# Patient Record
Sex: Male | Born: 1965
Health system: Southern US, Community
[De-identification: ages and names within clinical notes are randomized; demographics above are authoritative.]

## PROBLEM LIST (undated history)

## (undated) DIAGNOSIS — M1990 Unspecified osteoarthritis, unspecified site: Secondary | ICD-10-CM

## (undated) DIAGNOSIS — G2581 Restless legs syndrome: Secondary | ICD-10-CM

## (undated) DIAGNOSIS — M109 Gout, unspecified: Secondary | ICD-10-CM

## (undated) DIAGNOSIS — L089 Local infection of the skin and subcutaneous tissue, unspecified: Secondary | ICD-10-CM

## (undated) DIAGNOSIS — R0600 Dyspnea, unspecified: Secondary | ICD-10-CM

## (undated) DIAGNOSIS — I5189 Other ill-defined heart diseases: Secondary | ICD-10-CM

## (undated) DIAGNOSIS — E119 Type 2 diabetes mellitus without complications: Secondary | ICD-10-CM

## (undated) DIAGNOSIS — G4730 Sleep apnea, unspecified: Secondary | ICD-10-CM

## (undated) DIAGNOSIS — T4145XA Adverse effect of unspecified anesthetic, initial encounter: Secondary | ICD-10-CM

## (undated) DIAGNOSIS — I10 Essential (primary) hypertension: Secondary | ICD-10-CM

## (undated) DIAGNOSIS — S62109A Fracture of unspecified carpal bone, unspecified wrist, initial encounter for closed fracture: Secondary | ICD-10-CM

## (undated) DIAGNOSIS — F329 Major depressive disorder, single episode, unspecified: Secondary | ICD-10-CM

## (undated) DIAGNOSIS — F419 Anxiety disorder, unspecified: Secondary | ICD-10-CM

## (undated) DIAGNOSIS — M2550 Pain in unspecified joint: Secondary | ICD-10-CM

## (undated) DIAGNOSIS — R079 Chest pain, unspecified: Secondary | ICD-10-CM

## (undated) DIAGNOSIS — F32A Depression, unspecified: Secondary | ICD-10-CM

## (undated) DIAGNOSIS — I509 Heart failure, unspecified: Secondary | ICD-10-CM

## (undated) HISTORY — PX: TONSILLECTOMY: SUR1361

## (undated) HISTORY — DX: Anxiety disorder, unspecified: F41.9

## (undated) HISTORY — DX: Unspecified osteoarthritis, unspecified site: M19.90

## (undated) HISTORY — DX: Pain in unspecified joint: M25.50

## (undated) HISTORY — PX: HERNIA REPAIR: SHX51

## (undated) HISTORY — DX: Depression, unspecified: F32.A

## (undated) HISTORY — DX: Chest pain, unspecified: R07.9

## (undated) HISTORY — DX: Dyspnea, unspecified: R06.00

---
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
## 1983-04-22 HISTORY — PX: HUMERUS SURGERY: SHX672

## 2005-04-21 DIAGNOSIS — S62109A Fracture of unspecified carpal bone, unspecified wrist, initial encounter for closed fracture: Secondary | ICD-10-CM

## 2005-04-21 HISTORY — DX: Fracture of unspecified carpal bone, unspecified wrist, initial encounter for closed fracture: S62.109A

## 2012-09-14 ENCOUNTER — Encounter (HOSPITAL_BASED_OUTPATIENT_CLINIC_OR_DEPARTMENT_OTHER): Payer: Self-pay | Admitting: *Deleted

## 2012-09-14 ENCOUNTER — Emergency Department (HOSPITAL_BASED_OUTPATIENT_CLINIC_OR_DEPARTMENT_OTHER)
Admission: EM | Admit: 2012-09-14 | Discharge: 2012-09-14 | Disposition: A | Discharge status: Home / Self Care | Payer: 59 | Attending: Emergency Medicine | Admitting: Emergency Medicine

## 2012-09-14 DIAGNOSIS — I10 Essential (primary) hypertension: Secondary | ICD-10-CM | POA: Insufficient documentation

## 2012-09-14 DIAGNOSIS — M109 Gout, unspecified: Secondary | ICD-10-CM | POA: Insufficient documentation

## 2012-09-14 DIAGNOSIS — M25439 Effusion, unspecified wrist: Secondary | ICD-10-CM | POA: Insufficient documentation

## 2012-09-14 DIAGNOSIS — Z79899 Other long term (current) drug therapy: Secondary | ICD-10-CM | POA: Insufficient documentation

## 2012-09-14 HISTORY — DX: Gout, unspecified: M10.9

## 2012-09-14 HISTORY — DX: Essential (primary) hypertension: I10

## 2012-09-14 MED ORDER — INDOMETHACIN 25 MG PO CAPS: 25.0000 mg | ORAL_CAPSULE | Freq: Three times a day (TID) | ORAL | Status: DC

## 2012-09-14 MED ORDER — PREDNISONE 20 MG PO TABS: 40.0000 mg | ORAL_TABLET | Freq: Every day | ORAL | Status: DC

## 2012-09-14 MED ORDER — COLCHICINE 0.6 MG PO TABS
1.2000 mg | ORAL_TABLET | Freq: Once | ORAL | Status: AC
  Administered 2012-09-14: 1.2 mg via ORAL
  Filled 2012-09-14: qty 2

## 2012-09-14 MED ORDER — HYDROCODONE-ACETAMINOPHEN 5-325 MG PO TABS: 2.0000 | ORAL_TABLET | ORAL | Status: DC | PRN

## 2012-09-14 NOTE — ED Notes (Signed)
Right wrist pain since yesterday. States he is having an acute gout attack.

## 2012-09-14 NOTE — ED Provider Notes (Signed)
History     CSN: [id number]  Arrival date & time 09/14/12  2010   First MD Initiated Contact with Patient 09/14/12 2033      Chief Complaint  Patient presents with  . Wrist Pain    (Consider location/radiation/quality/duration/timing/severity/associated sxs/prior treatment) HPI Comments: 47 year old male with a history of frequent gouty attacks presents with a complaint of right wrist pain, onset was acute, yesterday, persistent, worse with palpation and range of motion, not associated with fevers. This is not common for him to have frequent gouty attacks, this feels similar to prior attacks and has no fevers redness though he does have mild swelling of the right wrist. He has been taking his allopurinol without missing any doses  Patient is a 47 y.o. male presenting with wrist pain. The history is provided by the patient.  Wrist Pain    Past Medical History  Diagnosis Date  . Gout   . Hypertension     Past Surgical History  Procedure Laterality Date  . Anterior cruciate ligament repair      No family history on file.  History  Substance Use Topics  . Smoking status: Never Smoker   . Smokeless tobacco: Not on file  . Alcohol Use: No      Review of Systems  Constitutional: Negative for fever.  Musculoskeletal: Positive for joint swelling.    Allergies  Review of patient's allergies indicates no known allergies.  Home Medications   Current Outpatient Rx  Name  Route  Sig  Dispense  Refill  . allopurinol (ZYLOPRIM) 300 MG tablet   Oral   Take 300 mg by mouth daily.         [name] lisinopril (PRINIVIL,ZESTRIL) 10 MG tablet   Oral   Take 10 mg by mouth daily.         [name] HYDROcodone-acetaminophen (NORCO/VICODIN) 5-325 MG per tablet   Oral   Take 2 tablets by mouth every 4 (four) hours as needed for pain.   20 tablet   0   . indomethacin (INDOCIN) 25 MG capsule   Oral   Take 1 capsule (25 mg total) by mouth 3 (three) times daily with meals. May take up  to 50mg  three times a day if no improvement with 25mg .   30 capsule   0   . predniSONE (DELTASONE) 20 MG tablet   Oral   Take 2 tablets (40 mg total) by mouth daily. Take 40 mg by mouth daily for 3 days, then 20mg  by mouth daily for 3 days, then 10mg  daily for 3 days   12 tablet   0     BP 165/100  Pulse 105  Temp(Src) 99.9 F (37.7 C) (Oral)  Resp 24  Wt 310 lb (140.615 kg)  SpO2 96%  Physical Exam  Nursing note and vitals reviewed. Constitutional: He appears well-developed and well-nourished. No distress.  HENT:  Head: Normocephalic and atraumatic.  Eyes: Conjunctivae are normal. No scleral icterus.  Cardiovascular: Normal rate, regular rhythm and intact distal pulses.   Pulmonary/Chest: Effort normal and breath sounds normal.  Musculoskeletal: He exhibits tenderness ( Tenderness and mild swelling over the right wrist, no erythema or warmth). He exhibits no edema.  Neurological: He is alert.  Skin: Skin is warm and dry. No rash noted. He is not diaphoretic.    ED Course  Procedures (including critical care time)  Labs Reviewed - No data to display No results found.   1. Acute gouty arthritis  MDM  The patient has a normal musculoskeletal exam other than his right wrist. He is obese, he is already being evaluated by rheumatology for his ongoing health, he will be given medications here and prescriptions for home, doubt septic arthritis, vitals without fever, patient expresses understanding for the indications for return.   Meds given in ED:  Medications  colchicine tablet 1.2 mg (not administered)    New Prescriptions   HYDROCODONE-ACETAMINOPHEN (NORCO/VICODIN) 5-325 MG PER TABLET    Take 2 tablets by mouth every 4 (four) hours as needed for pain.   INDOMETHACIN (INDOCIN) 25 MG CAPSULE    Take 1 capsule (25 mg total) by mouth 3 (three) times daily with meals. May take up to 50mg  three times a day if no improvement with 25mg .   PREDNISONE (DELTASONE) 20  MG TABLET    Take 2 tablets (40 mg total) by mouth daily. Take 40 mg by mouth daily for 3 days, then 20mg  by mouth daily for 3 days, then 10mg  daily for 3 days            Vida Roller, MD 09/14/12 2050

## 2012-09-25 ENCOUNTER — Emergency Department (HOSPITAL_COMMUNITY)
Admission: EM | Admit: 2012-09-25 | Discharge: 2012-09-25 | Disposition: A | Discharge status: Home / Self Care | Payer: 59 | Attending: Emergency Medicine | Admitting: Emergency Medicine

## 2012-09-25 ENCOUNTER — Encounter (HOSPITAL_COMMUNITY): Payer: Self-pay | Admitting: Emergency Medicine

## 2012-09-25 DIAGNOSIS — I10 Essential (primary) hypertension: Secondary | ICD-10-CM | POA: Insufficient documentation

## 2012-09-25 DIAGNOSIS — M109 Gout, unspecified: Secondary | ICD-10-CM | POA: Insufficient documentation

## 2012-09-25 DIAGNOSIS — Z79899 Other long term (current) drug therapy: Secondary | ICD-10-CM | POA: Insufficient documentation

## 2012-09-25 DIAGNOSIS — R229 Localized swelling, mass and lump, unspecified: Secondary | ICD-10-CM | POA: Insufficient documentation

## 2012-09-25 DIAGNOSIS — L539 Erythematous condition, unspecified: Secondary | ICD-10-CM | POA: Insufficient documentation

## 2012-09-25 MED ORDER — METHYLPREDNISOLONE SODIUM SUCC 125 MG IJ SOLR
125.0000 mg | Freq: Once | INTRAMUSCULAR | Status: AC
  Administered 2012-09-25: 125 mg via INTRAMUSCULAR
  Filled 2012-09-25: qty 2

## 2012-09-25 MED ORDER — OXYCODONE-ACETAMINOPHEN 5-325 MG PO TABS: 1.0000 | ORAL_TABLET | ORAL | Status: DC | PRN

## 2012-09-25 MED ORDER — PREDNISONE 20 MG PO TABS: 40.0000 mg | ORAL_TABLET | Freq: Every day | ORAL | Status: DC

## 2012-09-25 MED ORDER — KETOROLAC TROMETHAMINE 60 MG/2ML IM SOLN
60.0000 mg | Freq: Once | INTRAMUSCULAR | Status: AC
  Administered 2012-09-25: 60 mg via INTRAMUSCULAR
  Filled 2012-09-25: qty 2

## 2012-09-25 NOTE — ED Notes (Signed)
Pt states he has R wrist pain from gout.  Gout attack started last night.

## 2012-09-25 NOTE — ED Provider Notes (Signed)
History     CSN: [id number]  Arrival date & time 09/25/12  1240   First MD Initiated Contact with Patient 09/25/12 1326      Chief Complaint  Patient presents with  . Wrist Pain  . Gout    (Consider location/radiation/quality/duration/timing/severity/associated sxs/prior treatment) HPI Comments: Pt presents to the ED for right wrist pain. Patient has a history of gout with frequent attacks lately.  This attack started last night and has progressively worsened. Patient has recently started taking his daily allopurinol but states flares are not under control yet.  Patient has a followup appointment with his primary care physician on Wednesday for this.  Patient has been following gout diet and avoiding red meat, beer, and shellfish.  Pt and family recently moved back to [place] and has been under increased stress at work- he is unsure if this has contributed to the increase in flares.  Denies any numbness or paresthesias of right hand or fingers. No recent injury or trauma.  Pt cannot tolerate colchicine or indomethacin- causes GI upset.  The history is provided by the patient.    Past Medical History  Diagnosis Date  . Gout   . Hypertension     Past Surgical History  Procedure Laterality Date  . Anterior cruciate ligament repair    . Humerus surgery      History reviewed. No pertinent family history.  History  Substance Use Topics  . Smoking status: Never Smoker   . Smokeless tobacco: Not on file  . Alcohol Use: No      Review of Systems  Musculoskeletal: Positive for arthralgias.  All other systems reviewed and are negative.    Allergies  Review of patient's allergies indicates no known allergies.  Home Medications   Current Outpatient Rx  Name  Route  Sig  Dispense  Refill  . allopurinol (ZYLOPRIM) 300 MG tablet   Oral   Take 300 mg by mouth daily.         [name] HYDROcodone-acetaminophen (NORCO/VICODIN) 5-325 MG per tablet   Oral   Take 2 tablets by  mouth every 4 (four) hours as needed for pain.   20 tablet   0   . indomethacin (INDOCIN) 25 MG capsule   Oral   Take 1 capsule (25 mg total) by mouth 3 (three) times daily with meals. May take up to 50mg  three times a day if no improvement with 25mg .   30 capsule   0   . lisinopril (PRINIVIL,ZESTRIL) 10 MG tablet   Oral   Take 10 mg by mouth daily.         . predniSONE (DELTASONE) 20 MG tablet   Oral   Take 2 tablets (40 mg total) by mouth daily. Take 40 mg by mouth daily for 3 days, then 20mg  by mouth daily for 3 days, then 10mg  daily for 3 days   12 tablet   0     BP 186/111  Pulse 108  Temp(Src) 98.7 F (37.1 C) (Oral)  Resp 16  SpO2 97%  Physical Exam  Nursing note and vitals reviewed. Constitutional: He is oriented to person, place, and time. He appears well-developed and well-nourished. No distress.  HENT:  Head: Normocephalic and atraumatic.  Eyes: Conjunctivae and EOM are normal.  Neck: Normal range of motion. Neck supple.  Cardiovascular: Normal rate, regular rhythm and normal heart sounds.   Pulmonary/Chest: Effort normal and breath sounds normal.  Musculoskeletal:       Right wrist: He exhibits  decreased range of motion, tenderness, bony tenderness and swelling. He exhibits no effusion, no crepitus, no deformity and no laceration.  Right wrist diffusely TTP, erythematous, and mildly swollen, no deformity or laceration, sensation intact  Neurological: He is alert and oriented to person, place, and time.  Skin: Skin is warm and dry.  Psychiatric: He has a normal mood and affect.    ED Course  Procedures (including critical care time)  Labs Reviewed - No data to display No results found.   1. Gout flare       MDM   Pt with hx of gout, no injury or trauma- imaging deferred.  Solu-Medrol and Toradol given in the ED. Rx prednisone and Percocet. FU with PCP on Wednesday as previously scheduled . Discussed plan with patient, he agreed. Return  precautions advised.        Garlon Hatchet, PA-C 09/25/12 1536

## 2012-09-26 NOTE — ED Provider Notes (Signed)
Medical screening examination/treatment/procedure(s) were performed by non-physician practitioner and as supervising physician I was immediately available for consultation/collaboration.   Makenna Macaluso, MD 09/26/12 0705 

## 2013-03-14 ENCOUNTER — Emergency Department (HOSPITAL_BASED_OUTPATIENT_CLINIC_OR_DEPARTMENT_OTHER)
Admission: EM | Admit: 2013-03-14 | Discharge: 2013-03-14 | Disposition: A | Discharge status: Home / Self Care | Payer: 59 | Attending: Emergency Medicine | Admitting: Emergency Medicine

## 2013-03-14 ENCOUNTER — Encounter (HOSPITAL_BASED_OUTPATIENT_CLINIC_OR_DEPARTMENT_OTHER): Payer: Self-pay | Admitting: Emergency Medicine

## 2013-03-14 DIAGNOSIS — I10 Essential (primary) hypertension: Secondary | ICD-10-CM | POA: Insufficient documentation

## 2013-03-14 DIAGNOSIS — M109 Gout, unspecified: Secondary | ICD-10-CM

## 2013-03-14 DIAGNOSIS — Z79899 Other long term (current) drug therapy: Secondary | ICD-10-CM | POA: Insufficient documentation

## 2013-03-14 MED ORDER — KETOROLAC TROMETHAMINE 60 MG/2ML IM SOLN
60.0000 mg | Freq: Once | INTRAMUSCULAR | Status: AC
  Administered 2013-03-14: 60 mg via INTRAMUSCULAR
  Filled 2013-03-14: qty 2

## 2013-03-14 MED ORDER — OXYCODONE-ACETAMINOPHEN 5-325 MG PO TABS
1.0000 | ORAL_TABLET | Freq: Once | ORAL | Status: AC
  Administered 2013-03-14: 1 via ORAL
  Filled 2013-03-14: qty 1

## 2013-03-14 MED ORDER — PREDNISONE 10 MG PO TABS: ORAL_TABLET | ORAL | Status: DC

## 2013-03-14 MED ORDER — PREDNISONE 50 MG PO TABS
60.0000 mg | ORAL_TABLET | Freq: Once | ORAL | Status: AC
  Administered 2013-03-14: 60 mg via ORAL
  Filled 2013-03-14 (×2): qty 1

## 2013-03-14 MED ORDER — HYDROCODONE-ACETAMINOPHEN 5-325 MG PO TABS: 1.0000 | ORAL_TABLET | ORAL | Status: DC | PRN

## 2013-03-14 NOTE — ED Provider Notes (Signed)
CSN: [id number]     Arrival date & time 03/14/13  2119 History  This chart was scribed for Candyce Churn, MD by Leone Payor, ED Scribe. This patient was seen in room MH06/MH06 and the patient's care was started 10:04 PM.    Chief Complaint  Patient presents with  . Arm Swelling    The history is provided by the patient. No language interpreter was used.    HPI Comments: Justin Hall is a 47 y.o. male who presents to the Emergency Department complaining of a new episode of constant, worsened, severe left hand pain and swelling that began yesterday. Pt states he has a history of gout and this feels like a flare up. Pt reports having 2-3 episodes in the past 6 months. He denies recent injuries or trauma to the affected area. Pt states any sort of movement worsens the pain. He has taken Indocin in the past without relief. He states Solumedrol and pain medication relieved his symptoms in the past. He has an appointment with his PCP in 1 week. He denies fever.    Past Medical History  Diagnosis Date  . Gout   . Hypertension    Past Surgical History  Procedure Laterality Date  . Anterior cruciate ligament repair    . Humerus surgery     History reviewed. No pertinent family history. History  Substance Use Topics  . Smoking status: Never Smoker   . Smokeless tobacco: Not on file  . Alcohol Use: No    Review of Systems  All other systems reviewed and are negative.    Allergies  Review of patient's allergies indicates no known allergies.  Home Medications   Current Outpatient Rx  Name  Route  Sig  Dispense  Refill  . allopurinol (ZYLOPRIM) 300 MG tablet   Oral   Take 300 mg by mouth daily.         [name] lisinopril (PRINIVIL,ZESTRIL) 10 MG tablet   Oral   Take 10 mg by mouth daily.         [name] HYDROcodone-acetaminophen (NORCO/VICODIN) 5-325 MG per tablet   Oral   Take 2 tablets by mouth every 4 (four) hours as needed for pain.   20 tablet   0   . indomethacin  (INDOCIN) 25 MG capsule   Oral   Take 1 capsule (25 mg total) by mouth 3 (three) times daily with meals. May take up to 50mg  three times a day if no improvement with 25mg .   30 capsule   0   . oxyCODONE-acetaminophen (PERCOCET/ROXICET) 5-325 MG per tablet   Oral   Take 1 tablet by mouth every 4 (four) hours as needed for pain.   15 tablet   0   . predniSONE (DELTASONE) 20 MG tablet   Oral   Take 2 tablets (40 mg total) by mouth daily. Take 40 mg by mouth daily for 3 days, then 20mg  by mouth daily for 3 days, then 10mg  daily for 3 days   12 tablet   0   . predniSONE (DELTASONE) 20 MG tablet   Oral   Take 2 tablets (40 mg total) by mouth daily. Take 40 mg by mouth daily for 3 days, then 20mg  by mouth daily for 3 days, then 10mg  daily for 3 days   12 tablet   0    BP 190/111  Pulse 101  Temp(Src) 99.5 F (37.5 C) (Oral)  Resp 22  SpO2 100% Physical Exam  Nursing note and vitals  reviewed. Constitutional: He is oriented to person, place, and time. He appears well-developed and well-nourished. No distress.  HENT:  Head: Normocephalic and atraumatic.  Eyes: Conjunctivae are normal. No scleral icterus.  Neck: Neck supple.  Cardiovascular: Normal rate and intact distal pulses.   Pulmonary/Chest: Effort normal. No stridor. No respiratory distress.  Abdominal: Normal appearance. He exhibits no distension.  Musculoskeletal:  Moderate swelling of MCP joint of left hand with decreased ROM secondary to pain. Good cap refill. No signs of warmth or erythema.    Neurological: He is alert and oriented to person, place, and time.  Skin: Skin is warm and dry. No rash noted.  Psychiatric: He has a normal mood and affect. His behavior is normal.    ED Course  Procedures   DIAGNOSTIC STUDIES: Oxygen Saturation is 100% on RA, normal by my interpretation.    COORDINATION OF CARE: 10:12 PM Discussed treatment plan with pt at bedside and pt agreed to plan.   Labs Review Labs Reviewed -  No data to display Imaging Review No results found.  EKG Interpretation   None       MDM   1. Gouty arthritis    47 yo male with hx of gout presenting with pain in left hand, particularly around his 3-5 MCP joints.  No trauma.  No fevers.  Has some swelling and pain with ROM, but no significant erythema or warmth.  Afebrile and no reported fevers.  Doubt septic joint.  He states pain consistent with prior gout flares.  Advised close PCP follow up.    I personally performed the services described in this documentation, which was scribed in my presence. The recorded information has been reviewed and is accurate.     Candyce Churn, MD 03/14/13 [phone]

## 2013-03-14 NOTE — ED Notes (Signed)
Swelling to left hand, hx of gout

## 2013-05-03 ENCOUNTER — Emergency Department (HOSPITAL_BASED_OUTPATIENT_CLINIC_OR_DEPARTMENT_OTHER)
Admission: EM | Admit: 2013-05-03 | Discharge: 2013-05-03 | Disposition: A | Discharge status: Home / Self Care | Payer: Managed Care, Other (non HMO) | Attending: Emergency Medicine | Admitting: Emergency Medicine

## 2013-05-03 ENCOUNTER — Encounter (HOSPITAL_BASED_OUTPATIENT_CLINIC_OR_DEPARTMENT_OTHER): Payer: Self-pay | Admitting: Emergency Medicine

## 2013-05-03 DIAGNOSIS — M109 Gout, unspecified: Secondary | ICD-10-CM | POA: Insufficient documentation

## 2013-05-03 DIAGNOSIS — H6690 Otitis media, unspecified, unspecified ear: Secondary | ICD-10-CM | POA: Insufficient documentation

## 2013-05-03 DIAGNOSIS — J209 Acute bronchitis, unspecified: Secondary | ICD-10-CM | POA: Insufficient documentation

## 2013-05-03 DIAGNOSIS — R000 Tachycardia, unspecified: Secondary | ICD-10-CM | POA: Insufficient documentation

## 2013-05-03 DIAGNOSIS — Z79899 Other long term (current) drug therapy: Secondary | ICD-10-CM | POA: Insufficient documentation

## 2013-05-03 DIAGNOSIS — H6691 Otitis media, unspecified, right ear: Secondary | ICD-10-CM

## 2013-05-03 DIAGNOSIS — J40 Bronchitis, not specified as acute or chronic: Secondary | ICD-10-CM

## 2013-05-03 DIAGNOSIS — J3489 Other specified disorders of nose and nasal sinuses: Secondary | ICD-10-CM | POA: Insufficient documentation

## 2013-05-03 DIAGNOSIS — I10 Essential (primary) hypertension: Secondary | ICD-10-CM | POA: Insufficient documentation

## 2013-05-03 DIAGNOSIS — IMO0002 Reserved for concepts with insufficient information to code with codable children: Secondary | ICD-10-CM | POA: Insufficient documentation

## 2013-05-03 DIAGNOSIS — R52 Pain, unspecified: Secondary | ICD-10-CM | POA: Insufficient documentation

## 2013-05-03 MED ORDER — AMOXICILLIN 500 MG PO CAPS: 500.0000 mg | ORAL_CAPSULE | Freq: Three times a day (TID) | ORAL | Status: DC

## 2013-05-03 MED ORDER — OXYMETAZOLINE HCL 0.05 % NA SOLN
1.0000 | Freq: Three times a day (TID) | NASAL | Status: DC | PRN
  Administered 2013-05-03: 1 via NASAL
  Filled 2013-05-03: qty 15

## 2013-05-03 MED ORDER — HYDROCODONE-HOMATROPINE 5-1.5 MG/5ML PO SYRP: 5.0000 mL | ORAL_SOLUTION | Freq: Four times a day (QID) | ORAL | Status: DC | PRN

## 2013-05-03 NOTE — ED Provider Notes (Addendum)
CSN: [id number]     Arrival date & time 05/03/13  0902 History   First MD Initiated Contact with Patient 05/03/13 [phone]     Chief Complaint  Patient presents with  . cough congestion body aches x 4 days    (Consider location/radiation/quality/duration/timing/severity/associated sxs/prior Treatment) HPI Comments: Severe right ear pain starting last night  Patient is a 48 y.o. male presenting with URI. The history is provided by the patient.  URI Presenting symptoms: congestion, cough, ear pain, rhinorrhea and sore throat   Presenting symptoms: no fever   Severity:  Moderate Onset quality:  Gradual Duration:  4 days Timing:  Constant Progression:  Worsening Chronicity:  New Relieved by:  Nothing Worsened by:  Nothing tried Ineffective treatments:  OTC medications Associated symptoms: sinus pain   Associated symptoms: no neck pain and no wheezing   Risk factors: sick contacts     Past Medical History  Diagnosis Date  . Gout   . Hypertension    Past Surgical History  Procedure Laterality Date  . Anterior cruciate ligament repair    . Humerus surgery     History reviewed. No pertinent family history. History  Substance Use Topics  . Smoking status: Never Smoker   . Smokeless tobacco: Not on file  . Alcohol Use: No    Review of Systems  Constitutional: Negative for fever.  HENT: Positive for congestion, ear pain, rhinorrhea and sore throat.   Respiratory: Positive for cough. Negative for wheezing.   Musculoskeletal: Negative for neck pain.  All other systems reviewed and are negative.    Allergies  Review of patient's allergies indicates no known allergies.  Home Medications   Current Outpatient Rx  Name  Route  Sig  Dispense  Refill  . allopurinol (ZYLOPRIM) 300 MG tablet   Oral   Take 300 mg by mouth daily.         [name] HYDROcodone-acetaminophen (NORCO/VICODIN) 5-325 MG per tablet   Oral   Take 1 tablet by mouth every 4 (four) hours as needed.   10  tablet   0   . indomethacin (INDOCIN) 25 MG capsule   Oral   Take 1 capsule (25 mg total) by mouth 3 (three) times daily with meals. May take up to 50mg  three times a day if no improvement with 25mg .   30 capsule   0   . lisinopril (PRINIVIL,ZESTRIL) 10 MG tablet   Oral   Take 10 mg by mouth daily.         [name] oxyCODONE-acetaminophen (PERCOCET/ROXICET) 5-325 MG per tablet   Oral   Take 1 tablet by mouth every 4 (four) hours as needed for pain.   15 tablet   0   . predniSONE (DELTASONE) 10 MG tablet      Take 40 mg by mouth daily for 3 days, then 20mg  by mouth daily for 3 days, then 10mg  daily for 3 days   21 tablet   0   . predniSONE (DELTASONE) 20 MG tablet   Oral   Take 2 tablets (40 mg total) by mouth daily. Take 40 mg by mouth daily for 3 days, then 20mg  by mouth daily for 3 days, then 10mg  daily for 3 days   12 tablet   0    BP 172/102  Pulse 108  Temp(Src) 99.3 F (37.4 C) (Oral)  Resp 24  SpO2 98% Physical Exam  Nursing note and vitals reviewed. Constitutional: He is oriented to person, place, and time. He appears well-developed  and well-nourished. No distress.  HENT:  Head: Normocephalic and atraumatic.  Right Ear: Tympanic membrane is injected, erythematous and bulging.  Left Ear: Tympanic membrane and ear canal normal.  Nose: Mucosal edema and rhinorrhea present. Right sinus exhibits frontal sinus tenderness. Left sinus exhibits frontal sinus tenderness.  Mouth/Throat: Oropharynx is clear and moist and mucous membranes are normal. No oropharyngeal exudate, posterior oropharyngeal edema, posterior oropharyngeal erythema or tonsillar abscesses.  Eyes: Conjunctivae and EOM are normal. Pupils are equal, round, and reactive to light.  Neck: Normal range of motion. Neck supple.  Cardiovascular: Regular rhythm and intact distal pulses.  Tachycardia present.   No murmur heard. Pulmonary/Chest: Effort normal and breath sounds normal. No respiratory distress. He has  no wheezes. He has no rales.  Abdominal: Soft. He exhibits no distension. There is no tenderness. There is no rebound and no guarding.  Musculoskeletal: Normal range of motion. He exhibits no edema and no tenderness.  Neurological: He is alert and oriented to person, place, and time.  Skin: Skin is warm and dry. No rash noted. No erythema.  Psychiatric: He has a normal mood and affect. His behavior is normal.    ED Course  Procedures (including critical care time) Labs Review Labs Reviewed - No data to display Imaging Review No results found.  EKG Interpretation   None       MDM   1. Bronchitis   2. Otitis media of right ear     Pt with symptoms consistent with viral URI/bronchitis.  Well appearing here.  No signs of breathing difficulty  No wheezing or pharyngitis but pt also found to have right otitis.   Pt given afrin, cough suppressant and amoxicillin. Pt htn here but has not taken lisinopril today and has been taking OTC meds.  Instructed to avoid most OTC meds, take his BP med and follow BP.  Blanchie Dessert, MD 05/03/13 [place], MD 05/03/13 [phone]

## 2013-05-03 NOTE — ED Notes (Signed)
4 days of cough congestion ear pain scratchy throat and body aches occasional chills.

## 2013-05-03 NOTE — Discharge Instructions (Signed)
Bronchitis °Bronchitis is swelling (inflammation) of the air tubes leading to your lungs (bronchi). This causes mucus and a cough. If the swelling gets bad, you may have trouble breathing. °HOME CARE  °· Rest. °· Drink enough fluids to keep your pee (urine) clear or pale yellow (unless you have a condition where you have to watch how much you drink). °· Only take medicine as told by your doctor. If you were given antibiotic medicines, finish them even if you start to feel better. °· Avoid smoke, irritating chemicals, and strong smells. These make the problem worse. Quit smoking if you smoke. This helps your lungs heal faster. °· Use a cool mist humidifier. Change the water in the humidifier every day. You can also sit in the bathroom with hot shower running for 5 10 minutes. Keep the door closed. °· See your health care provider as told. °· Wash your hands often. °GET HELP IF: °Your problems do not get better after 1 week. °GET HELP RIGHT AWAY IF:  °· Your fever gets worse. °· You have chills. °· Your chest hurts. °· Your problems breathing get worse. °· You have blood in your mucus. °· You pass out (faint). °· You feel lightheaded. °· You have a bad headache. °· You throw up (vomit) again and again. °MAKE SURE YOU: °· Understand these instructions. °· Will watch your condition. °· Will get help right away if you are not doing well or get worse. °Document Released: 09/24/2007 Document Revised: 01/26/2013 Document Reviewed: 11/30/2012 °[employer]® Patient Information ©2014 [employer]. ° °

## 2013-05-12 ENCOUNTER — Emergency Department (HOSPITAL_BASED_OUTPATIENT_CLINIC_OR_DEPARTMENT_OTHER)
Admission: EM | Admit: 2013-05-12 | Discharge: 2013-05-12 | Disposition: A | Discharge status: Home / Self Care | Payer: Managed Care, Other (non HMO) | Attending: Emergency Medicine | Admitting: Emergency Medicine

## 2013-05-12 ENCOUNTER — Encounter (HOSPITAL_BASED_OUTPATIENT_CLINIC_OR_DEPARTMENT_OTHER): Payer: Self-pay | Admitting: Emergency Medicine

## 2013-05-12 DIAGNOSIS — Z792 Long term (current) use of antibiotics: Secondary | ICD-10-CM | POA: Insufficient documentation

## 2013-05-12 DIAGNOSIS — Z79899 Other long term (current) drug therapy: Secondary | ICD-10-CM | POA: Insufficient documentation

## 2013-05-12 DIAGNOSIS — I10 Essential (primary) hypertension: Secondary | ICD-10-CM | POA: Insufficient documentation

## 2013-05-12 DIAGNOSIS — M109 Gout, unspecified: Secondary | ICD-10-CM

## 2013-05-12 MED ORDER — LORAZEPAM 1 MG PO TABS
2.0000 mg | ORAL_TABLET | Freq: Once | ORAL | Status: AC
  Administered 2013-05-12: 2 mg via ORAL
  Filled 2013-05-12: qty 2

## 2013-05-12 MED ORDER — COLCHICINE 0.6 MG PO TABS
1.2000 mg | ORAL_TABLET | Freq: Once | ORAL | Status: AC
  Administered 2013-05-12: 1.2 mg via ORAL
  Filled 2013-05-12: qty 2

## 2013-05-12 MED ORDER — INDOMETHACIN 25 MG PO CAPS: 25.0000 mg | ORAL_CAPSULE | Freq: Three times a day (TID) | ORAL | Status: DC

## 2013-05-12 MED ORDER — PREDNISONE 20 MG PO TABS: 40.0000 mg | ORAL_TABLET | Freq: Every day | ORAL | Status: DC

## 2013-05-12 MED ORDER — METHYLPREDNISOLONE SODIUM SUCC 125 MG IJ SOLR: 125.0000 mg | Freq: Once | INTRAMUSCULAR | Status: DC

## 2013-05-12 MED ORDER — ALLOPURINOL 300 MG PO TABS: 300.0000 mg | ORAL_TABLET | Freq: Every day | ORAL | Status: DC

## 2013-05-12 MED ORDER — METHYLPREDNISOLONE SODIUM SUCC 125 MG IJ SOLR
125.0000 mg | Freq: Once | INTRAMUSCULAR | Status: AC
  Administered 2013-05-12: 125 mg via INTRAMUSCULAR
  Filled 2013-05-12: qty 2

## 2013-05-12 NOTE — ED Notes (Signed)
Left foot pain x 2 days-hx of gout-feels same-also c/o pain to right knee x today

## 2013-05-12 NOTE — ED Provider Notes (Signed)
CSN: [id number]     Arrival date & time 05/12/13  1426 History   First MD Initiated Contact with Patient 05/12/13 1522     Chief Complaint  Patient presents with  . Foot Pain   (Consider location/radiation/quality/duration/timing/severity/associated sxs/prior Treatment) HPI Comments: Patient is a 48 year old male with a past medical history of gout and hypertension who presents with left ankle pain for the past 2 days. Symptoms started gradually and progressively worsening since the onset. The pain is stabbing and severe with radiation to his foot. Palpation and weight bearing makes the pain worse. No alleviating factors. Patient reports running out of Allopurinol 2 weeks ago. Patient is working on finding a PCP in town since he just moved here from [place]. No other associated symptoms. Patient denies any injury. Patient states his pain feels like a gout flare.   Past Medical History  Diagnosis Date  . Gout   . Hypertension    Past Surgical History  Procedure Laterality Date  . Anterior cruciate ligament repair    . Humerus surgery     No family history on file. History  Substance Use Topics  . Smoking status: Never Smoker   . Smokeless tobacco: Not on file  . Alcohol Use: No    Review of Systems  Constitutional: Negative for fever, chills and fatigue.  HENT: Negative for trouble swallowing.   Eyes: Negative for visual disturbance.  Respiratory: Negative for shortness of breath.   Cardiovascular: Negative for chest pain and palpitations.  Gastrointestinal: Negative for nausea, vomiting, abdominal pain and diarrhea.  Genitourinary: Negative for dysuria and difficulty urinating.  Musculoskeletal: Positive for arthralgias and joint swelling. Negative for neck pain.  Skin: Positive for color change.  Neurological: Negative for dizziness and weakness.  Psychiatric/Behavioral: Negative for dysphoric mood.    Allergies  Review of patient's allergies indicates no known  allergies.  Home Medications   Current Outpatient Rx  Name  Route  Sig  Dispense  Refill  . ROPINIRole HCl (REQUIP PO)   Oral   Take by mouth.         [name] allopurinol (ZYLOPRIM) 300 MG tablet   Oral   Take 300 mg by mouth daily.         [name] amoxicillin (AMOXIL) 500 MG capsule   Oral   Take 1 capsule (500 mg total) by mouth 3 (three) times daily.   21 capsule   0   . HYDROcodone-acetaminophen (NORCO/VICODIN) 5-325 MG per tablet   Oral   Take 1 tablet by mouth every 4 (four) hours as needed.   10 tablet   0   . HYDROcodone-homatropine (HYCODAN) 5-1.5 MG/5ML syrup   Oral   Take 5 mLs by mouth every 6 (six) hours as needed for cough.   120 mL   0   . indomethacin (INDOCIN) 25 MG capsule   Oral   Take 1 capsule (25 mg total) by mouth 3 (three) times daily with meals. May take up to 50mg  three times a day if no improvement with 25mg .   30 capsule   0   . lisinopril (PRINIVIL,ZESTRIL) 10 MG tablet   Oral   Take 10 mg by mouth daily.         [name] oxyCODONE-acetaminophen (PERCOCET/ROXICET) 5-325 MG per tablet   Oral   Take 1 tablet by mouth every 4 (four) hours as needed for pain.   15 tablet   0   . predniSONE (DELTASONE) 10 MG tablet  Take 40 mg by mouth daily for 3 days, then 20mg  by mouth daily for 3 days, then 10mg  daily for 3 days   21 tablet   0   . predniSONE (DELTASONE) 20 MG tablet   Oral   Take 2 tablets (40 mg total) by mouth daily. Take 40 mg by mouth daily for 3 days, then 20mg  by mouth daily for 3 days, then 10mg  daily for 3 days   12 tablet   0    BP 152/102  Pulse 101  Temp(Src) 97.9 F (36.6 C) (Oral)  SpO2 98% Physical Exam  Nursing note and vitals reviewed. Constitutional: He is oriented to person, place, and time. He appears well-developed and well-nourished. No distress.  HENT:  Head: Normocephalic and atraumatic.  Eyes: Conjunctivae and EOM are normal.  Neck: Normal range of motion.  Cardiovascular: Normal rate and regular  rhythm.  Exam reveals no gallop and no friction rub.   No murmur heard. Pulmonary/Chest: Effort normal and breath sounds normal. He has no wheezes. He has no rales. He exhibits no tenderness.  Abdominal: Soft. He exhibits no distension. There is no tenderness. There is no rebound.  Musculoskeletal:  Left ankle edematous and erythematous with limited ROM due to pain. The joint is warm with generalized tenderness. The redness and edema extends into the patient's left foot.   Neurological: He is alert and oriented to person, place, and time. Coordination normal.  Speech is goal-oriented. Moves limbs without ataxia.   Skin: Skin is warm and dry.  Psychiatric: He has a normal mood and affect. His behavior is normal.    ED Course  Procedures (including critical care time) Labs Review Labs Reviewed - No data to display Imaging Review No results found.  EKG Interpretation   None       MDM   1. Gout flare     4:50 PM Patient given colchicine and ativan for symptoms which provided relief. Patient is requesting steroids. Patient likely has gout, as he ran out of allopurinol. Patient will be discharged with prednisone taper, indomethacin, and allopurinol prescription. Vitals stable and patient afebrile.     Alvina Chou, PA-C 05/12/13 1703

## 2013-05-12 NOTE — ED Provider Notes (Signed)
Medical screening examination/treatment/procedure(s) were performed by non-physician practitioner and as supervising physician I was immediately available for consultation/collaboration.  EKG Interpretation   None         Blanchie Dessert, MD 05/12/13 2341

## 2013-05-12 NOTE — Discharge Instructions (Signed)
Take Allopurinol daily. Take Indomethacin as needed for pain. Take prednisone as directed until gone. Refer to attached documents for more information. Use the resource guide below to find a primary care provider.    Emergency Department Resource Guide 1) Find a Doctor and Pay Out of Pocket Although you won't have to find out who is covered by your insurance plan, it is a good idea to ask around and get recommendations. You will then need to call the office and see if the doctor you have chosen will accept you as a new patient and what types of options they offer for patients who are self-pay. Some doctors offer discounts or will set up payment plans for their patients who do not have insurance, but you will need to ask so you aren't surprised when you get to your appointment.  2) Contact Your Local Health Department Not all health departments have doctors that can see patients for sick visits, but many do, so it is worth a call to see if yours does. If you don't know where your local health department is, you can check in your phone book. The CDC also has a tool to help you locate your state's health department, and many state websites also have listings of all of their local health departments.  3) Find a [facility] If your illness is not likely to be very severe or complicated, you may want to try a walk in clinic. These are popping up all over the country in pharmacies, drugstores, and shopping centers. They're usually staffed by nurse practitioners or physician assistants that have been trained to treat common illnesses and complaints. They're usually fairly quick and inexpensive. However, if you have serious medical issues or chronic medical problems, these are probably not your best option.  No Primary Care Doctor: - Call Health Connect at  [phone] - they can help you locate a primary care doctor that  accepts your insurance, provides certain services, etc. - Physician Referral Service-  [phone]  Chronic Pain Problems: Organization         Address  Phone   Notes  [facility]  [phone] Patients need to be referred by their primary care doctor.   Medication Assistance: Organization         Address  Phone   Notes  [facility] Medication [facility], [facility], [facility] [place], [place] [address] [phone] --Must be a resident of [facility] -- Must have NO insurance coverage whatsoever (no Medicaid/ Medicare, etc.) -- The pt. MUST have a primary care doctor that directs their care regularly and follows them in the community   [facility]ist  [phone]   [employer]  [phone]    Agencies that provide inexpensive medical care: Organization         Address  Phone   Notes  [place]  [phone]   [facility]    [phone]   [facility] [place], [place] [address] [phone]   [place] [address], [place] [phone]   Planned Parenthood    [phone]   [facility]    [phone]   [place] and [place] [address], [place] Phone:  [phone], Fax:  [phone] Hours of Operation:  9 am - 6 pm, M-F.  Also accepts Medicaid/Medicare and self-pay.  [facility] for  Children  [address], [place] Phone: [phone], Fax: [phone]. Hours of Operation:  8:30 am - 5:30 pm, M-F.  Also accepts Medicaid and self-pay.  [facility] [place] [address] [place] Phone: [phone]   [place], [place], [place] [phone], Ext. [phone] Mondays & Thursdays: 7-9 AM.  First 15 patients are seen on a first come, first serve basis.    [place] Providers:  Organization         Address  Phone   Notes  [facility] [address],  [place] [phone] Also accepts self-pay patients.  [facility] [id number] [address], [place]  [phone]   [place], [address], [place] [phone]   [facility] [address] [place] [phone]   Lucianne Lei [address], [place]   [phone] Only accepts [place] Access [place] patients after they have their name applied to their card.   Self-Pay (no insurance) in [facility]:  Organization         Address  Phone   Notes  Sickle Cell Patients, [facility] Internal Medicine [place] [phone]   [facility] Urgent Care [place] [phone]   [facility] [place]  [place], [place], [place] [phone]   [facility]/Dr. Osei-Bonsu  [address] [place] or [address], [place] [phone] Phone number for both [place] and [place] locations is the same.  [facility]gent [facility] and [facility] [address] [place] [phone]   [facility] [address] [place] or [address] [place] [address] [phone]   [facility] [address] [place] [phone], phone; [phone], fax Sees patients 1st and 3rd Saturday of every month.  Must not qualify for public or private insurance (i.e. Medicaid, Medicare, [place] Health Choice, Veterans' Benefits)  Household income should be no more than 200% of the poverty level The clinic cannot treat you if you are pregnant or think you are pregnant  Sexually transmitted diseases are not treated at the clinic.    Dental Care: Organization         Address  Phone  Notes  [facility] Department of [facility] [phone] Accepts children up to age 64 who are enrolled in  [place] or [place]; pregnant women with a Medicaid card; and children who have applied for Medicaid or [place] Health Choice, but were declined, whose parents can pay a reduced fee at time of service.  [facility] Department of [facility]  [address], [place] [phone] Accepts children up to age 59 who are enrolled in [place] or [place]; pregnant women with a Medicaid card; and children who have applied for Medicaid or [place] Health Choice, but were declined, whose parents can pay a reduced fee at time of service.  [place] Adult [facility] Access PROGRAM  [place] [phone] Patients are seen by appointment only. Walk-ins are not accepted. [place] will see patients 70 years of age and older. Monday - Tuesday (8am-5pm) Most Wednesdays (8:30-5pm) $30 per visit, cash only  [place] Adult [facility] Access PRO[facility]  [address], [facility] [phone] Patients are seen by appointment only. Walk-ins are not accepted. [place] will see patients 55 years of age and older. One Wednesday Evening (Monthly: Volunteer Based).  $30 per visit, cash only  [place]  [phone] for adults; Children under age 16, call Graduate Pediatric Dentistry at [phone]. Children aged 15-14, please call [phone] to request a pediatric application.  Dental services are provided in all areas of dental care including fillings, crowns and bridges, complete and partial dentures, implants, gum treatment, root canals, and extractions. Preventive care is also provided. Treatment is provided to both adults and children. Patients are selected via a lottery and there is often a waiting list.   [facility] [address] [place]  [phone] [email or web address]   [facility] [address] [place], [place] [phone], Ext. [phone] Second and Fourth Thursday of each month, opens at 6:30  AM; Clinic ends at 9 AM.  Patients are seen on a first-come first-served basis, and a limited number are seen during each clinic.   [facility]  [address] [place]ard Danker [place], [place] [phone]   Eligibility Requirements You must have lived in [place], [place], or [place] counties for at least the last three months.   You cannot be eligible for state or federal sponsored [employer], including [employer], [place], or [employer].   You generally cannot be eligible for healthcare insurance through your employer.    How to apply: Eligibility screenings are held every Tuesday and Wednesday afternoon from 1:00 pm until 4:00 pm. You do not need an appointment for the interview!  [facility] [address] [place], [place]   [employer]  [place] Department  [place]  [phone]    Behavioral Health Resources in the Community: Intensive Outpatient Programs Organization         Address  Phone  Notes  [place]. [address], [place], [place] [phone]   [facility] Outpatient [address], [place], [employer]   ADS: Alcohol & Drug Svcs [address], [place], [place]   [place] [address]  [place], [place] or [phone]   Substance Abuse Resources Organization         Address  Phone  Notes  Alcohol and Drug Services  [phone]   [place]  [phone]   [facility] [place]   Chinita Pester  [phone]   Residential & Outpatient Substance Abuse Program  [phone]   Psychological Services Organization         Address  Phone  Notes  [facility] [place]  [place]  [phone]   [place] [address] [place] [phone] or  [phone]    Mobile Crisis Teams Organization         Address  Phone  Notes  [facility], Mobile Crisis Care Unit  [phone]   [facility]  [address] [place], [place]   [place] [address] [place] [phone]    Self-Help/Support Groups Organization         Address  Phone             Notes  Mental Health Assoc. of [place] - variety of support groups  [place] Call for more information  Narcotics Anonymous (NA), [facility]aring [facility] [address], [employer] [place]  2 meetings at this location   Special educational needs teacher         Address  Phone  Notes  [facility] [place],    [place]  [phone]   [facility]  [address], [place] [address], [place], [place]   [place], [place] [phone] Admissions: 8am-3pm M-F  Incentives Substance [place] [address] [place] [address],    [place], [place] [phone]   [facility] [address] [place], [place], [place]   [facility] [address],  [place], [place]   [facility] - Intensive Outpatient [address], [place], [place]   [facility] ([place].,  [place], [place] [phone] or [phone]   [facility] (RTS) [address] [place], [place] Accepts Medicaid  [facility] [place] [address]  [place] [phone] Substance Abuse/Addiction Treatment   [facility] Organization         Address  Phone  Notes  [facility]  [phone]   Domenic Schwab, PhD [address] Arlis [place], [place]   [phone] or [phone]   [place], [place] [phone]   [facility] [address],  [place], [place] [phone] Insurance/Medicaid/sponsorship through [facility] and Families [address], [address]                                    [place], [place] [phone] [place] [address], [place] [phone]    Dr. Adele Schilder  [phone]   [facility] Dept. 1) [address] [place] 2) [place] 3)  [place] [address], [place] [phone]  [phone]   [place] [phone] or [phone] (After Hours)

## 2013-06-27 ENCOUNTER — Emergency Department (HOSPITAL_BASED_OUTPATIENT_CLINIC_OR_DEPARTMENT_OTHER)
Admission: EM | Admit: 2013-06-27 | Discharge: 2013-06-27 | Disposition: A | Discharge status: Home / Self Care | Payer: Managed Care, Other (non HMO) | Attending: Emergency Medicine | Admitting: Emergency Medicine

## 2013-06-27 ENCOUNTER — Encounter (HOSPITAL_BASED_OUTPATIENT_CLINIC_OR_DEPARTMENT_OTHER): Payer: Self-pay | Admitting: Emergency Medicine

## 2013-06-27 DIAGNOSIS — G2581 Restless legs syndrome: Secondary | ICD-10-CM | POA: Insufficient documentation

## 2013-06-27 DIAGNOSIS — M109 Gout, unspecified: Secondary | ICD-10-CM | POA: Insufficient documentation

## 2013-06-27 DIAGNOSIS — Z79899 Other long term (current) drug therapy: Secondary | ICD-10-CM | POA: Insufficient documentation

## 2013-06-27 DIAGNOSIS — I10 Essential (primary) hypertension: Secondary | ICD-10-CM | POA: Insufficient documentation

## 2013-06-27 DIAGNOSIS — Z791 Long term (current) use of non-steroidal anti-inflammatories (NSAID): Secondary | ICD-10-CM | POA: Insufficient documentation

## 2013-06-27 HISTORY — DX: Restless legs syndrome: G25.81

## 2013-06-27 MED ORDER — PREDNISONE 10 MG PO TABS: ORAL_TABLET | ORAL | Status: DC

## 2013-06-27 MED ORDER — OXYCODONE-ACETAMINOPHEN 5-325 MG PO TABS
2.0000 | ORAL_TABLET | Freq: Once | ORAL | Status: AC
  Administered 2013-06-27: 2 via ORAL
  Filled 2013-06-27: qty 2

## 2013-06-27 MED ORDER — OXYCODONE-ACETAMINOPHEN 5-325 MG PO TABS: 1.0000 | ORAL_TABLET | Freq: Four times a day (QID) | ORAL | Status: DC | PRN

## 2013-06-27 MED ORDER — ALLOPURINOL 100 MG PO TABS: 150.0000 mg | ORAL_TABLET | Freq: Every day | ORAL | Status: DC

## 2013-06-27 NOTE — ED Notes (Signed)
Pt waiting discharge for wife to arrive and provide transport home.

## 2013-06-27 NOTE — ED Provider Notes (Signed)
CSN: [id number]     Arrival date & time 06/27/13  0544 History   First MD Initiated Contact with Patient 06/27/13 0557     Chief Complaint  Patient presents with  . Knee Pain     (Consider location/radiation/quality/duration/timing/severity/associated sxs/prior Treatment) Patient is a 48 y.o. male presenting with knee pain. The history is provided by the patient.  Knee Pain Location:  Knee Injury: no   Knee location:  R knee Pain details:    Quality:  Throbbing   Radiates to:  Does not radiate   Severity:  Severe   Onset quality:  Sudden   Timing:  Constant   Progression:  Unchanged Chronicity:  New Dislocation: no   Foreign body present:  No foreign bodies Prior injury to area:  No Relieved by:  Nothing Worsened by:  Nothing tried Ineffective treatments:  None tried Associated symptoms: no itching   Risk factors: no concern for non-accidental trauma   Gout flaring for 3 days  Past Medical History  Diagnosis Date  . Gout   . Hypertension   . Restless leg syndrome    Past Surgical History  Procedure Laterality Date  . Anterior cruciate ligament repair    . Humerus surgery     No family history on file. History  Substance Use Topics  . Smoking status: Never Smoker   . Smokeless tobacco: Not on file  . Alcohol Use: No    Review of Systems  Skin: Negative for itching.  All other systems reviewed and are negative.      Allergies  Review of patient's allergies indicates no known allergies.  Home Medications   Current Outpatient Rx  Name  Route  Sig  Dispense  Refill  . allopurinol (ZYLOPRIM) 300 MG tablet   Oral   Take 300 mg by mouth daily.         [name] allopurinol (ZYLOPRIM) 300 MG tablet   Oral   Take 1 tablet (300 mg total) by mouth daily.   30 tablet   1   . amoxicillin (AMOXIL) 500 MG capsule   Oral   Take 1 capsule (500 mg total) by mouth 3 (three) times daily.   21 capsule   0   . HYDROcodone-acetaminophen (NORCO/VICODIN) 5-325 MG per  tablet   Oral   Take 1 tablet by mouth every 4 (four) hours as needed.   10 tablet   0   . HYDROcodone-homatropine (HYCODAN) 5-1.5 MG/5ML syrup   Oral   Take 5 mLs by mouth every 6 (six) hours as needed for cough.   120 mL   0   . indomethacin (INDOCIN) 25 MG capsule   Oral   Take 1 capsule (25 mg total) by mouth 3 (three) times daily with meals. May take up to 50mg  three times a day if no improvement with 25mg .   30 capsule   0   . indomethacin (INDOCIN) 25 MG capsule   Oral   Take 1 capsule (25 mg total) by mouth 3 (three) times daily with meals. May take up to 50mg  three times a day if no improvement with 25mg .   30 capsule   0   . lisinopril (PRINIVIL,ZESTRIL) 10 MG tablet   Oral   Take 10 mg by mouth daily.         [name] oxyCODONE-acetaminophen (PERCOCET/ROXICET) 5-325 MG per tablet   Oral   Take 1 tablet by mouth every 4 (four) hours as needed for pain.   15 tablet  0   . predniSONE (DELTASONE) 10 MG tablet      Take 40 mg by mouth daily for 3 days, then 20mg  by mouth daily for 3 days, then 10mg  daily for 3 days   21 tablet   0   . predniSONE (DELTASONE) 20 MG tablet   Oral   Take 2 tablets (40 mg total) by mouth daily. Take 40 mg by mouth daily for 3 days, then 20mg  by mouth daily for 3 days, then 10mg  daily for 3 days   12 tablet   0   . predniSONE (DELTASONE) 20 MG tablet   Oral   Take 2 tablets (40 mg total) by mouth daily. Take 40 mg by mouth daily for 3 days, then 20mg  by mouth daily for 3 days, then 10mg  daily for 3 days   12 tablet   0   . ROPINIRole HCl (REQUIP PO)   Oral   Take by mouth.          BP 174/104  Pulse 96  Temp(Src) 98 F (36.7 C) (Oral)  Resp 20  SpO2 96% Physical Exam  Constitutional: He is oriented to person, place, and time. He appears well-developed and well-nourished.  HENT:  Head: Normocephalic and atraumatic.  Mouth/Throat: Oropharynx is clear and moist.  Eyes: Conjunctivae are normal. Pupils are equal, round,  and reactive to light.  Neck: Normal range of motion.  Cardiovascular: Normal rate, regular rhythm and intact distal pulses.   Pulmonary/Chest: Effort normal and breath sounds normal. He has no wheezes. He has no rales.  Abdominal: Soft. Bowel sounds are normal. There is no tenderness. There is no rebound.  Musculoskeletal: Normal range of motion. He exhibits tenderness.  Right anterior knee swelling no patella alta or b[place], negative anterior and posterior drawer tests  Neurological: He is alert and oriented to person, place, and time. He has normal reflexes.  Skin: Skin is warm and dry. No erythema.  Psychiatric: He has a normal mood and affect.    ED Course  Procedures (including critical care time) Labs Review Labs Reviewed - No data to display Imaging Review No results found.   EKG Interpretation None      MDM   Final diagnoses:  Restless leg syndrome    Pain medication and steroids.  Follow up with sports medicine    Kahealani Yankovich K Derwin Reddy-Rasch, MD 06/27/13 [phone]

## 2013-06-27 NOTE — ED Notes (Signed)
Pt reports gout pain right knee onset x 3 days

## 2013-06-27 NOTE — Discharge Instructions (Signed)
Purine Restricted Diet  A low-purine diet consists of foods that reduce uric acid made in your body.  INDICATIONS FOR USE   Your caregiver may ask you to follow a low-purine diet to reduce gout flairs.   GUIDELINES   Avoid high-purine foods, including all alcohol, yeast extracts taken as supplements, and sauces made from meats (like gravy). Do not eat high-purine meats, including anchovies, sardines, herring, mussels, tuna, codfish, scallops, trout, haddock, bacon, organ meats, tripe, goose, wild game, and sweetbreads.   Grains  · Allowed/Recommended: All, except those listed to consume in moderation.  · Consume in Moderation: Oatmeal ( cup uncooked daily), wheat bran or germ (¼ cup daily), and whole grains.  Vegetables  · Allowed/Recommended: All, except those listed to consume in moderation.  · Consume in Moderation: Asparagus, cauliflower, spinach, mushrooms, and green peas (½ cup daily).  Fruit  · Allowed/Recommended: All.  · Consume in Moderation: None.  Meat and Meat Substitutes  · Allowed/Recommended: Eggs, nuts, and peanut butter.  · Consume in Moderation: Limit to 4 to 6 oz daily. Avoid high-purine meats. Lentils, peas, and dried beans (1 cup daily).  Milk  · Allowed/Recommended: All. Choose low-fat or skim when possible.  · Consume in Moderation: None.  Fats and Oils  · Allowed/Recommended: All.  · Consume in Moderation: None.  Beverages  · Allowed/Recommended: All, except those listed to avoid.  · Avoid: All alcohol.  Condiments/Miscellaneous  · Allowed/Recommended: All, except those listed to consume in moderation.  · Consume in Moderation: Bouillon and meat-based broths and soups.  Document Released: 08/02/2010 Document Revised: 06/30/2011 Document Reviewed: 08/02/2010  Exit[employer]® Patient Information ©2014 [employer].

## 2013-08-07 ENCOUNTER — Encounter (HOSPITAL_BASED_OUTPATIENT_CLINIC_OR_DEPARTMENT_OTHER): Payer: Self-pay | Admitting: Emergency Medicine

## 2013-08-07 ENCOUNTER — Emergency Department (HOSPITAL_BASED_OUTPATIENT_CLINIC_OR_DEPARTMENT_OTHER)
Admission: EM | Admit: 2013-08-07 | Discharge: 2013-08-07 | Disposition: A | Discharge status: Home / Self Care | Payer: Managed Care, Other (non HMO) | Attending: Emergency Medicine | Admitting: Emergency Medicine

## 2013-08-07 DIAGNOSIS — Z79899 Other long term (current) drug therapy: Secondary | ICD-10-CM | POA: Insufficient documentation

## 2013-08-07 DIAGNOSIS — M109 Gout, unspecified: Secondary | ICD-10-CM

## 2013-08-07 DIAGNOSIS — I10 Essential (primary) hypertension: Secondary | ICD-10-CM | POA: Insufficient documentation

## 2013-08-07 DIAGNOSIS — G2581 Restless legs syndrome: Secondary | ICD-10-CM | POA: Insufficient documentation

## 2013-08-07 DIAGNOSIS — IMO0002 Reserved for concepts with insufficient information to code with codable children: Secondary | ICD-10-CM | POA: Insufficient documentation

## 2013-08-07 DIAGNOSIS — E119 Type 2 diabetes mellitus without complications: Secondary | ICD-10-CM | POA: Insufficient documentation

## 2013-08-07 DIAGNOSIS — Z792 Long term (current) use of antibiotics: Secondary | ICD-10-CM | POA: Insufficient documentation

## 2013-08-07 DIAGNOSIS — Z791 Long term (current) use of non-steroidal anti-inflammatories (NSAID): Secondary | ICD-10-CM | POA: Insufficient documentation

## 2013-08-07 HISTORY — DX: Type 2 diabetes mellitus without complications: E11.9

## 2013-08-07 MED ORDER — MELOXICAM 7.5 MG PO TABS: 7.5000 mg | ORAL_TABLET | Freq: Every day | ORAL | Status: DC

## 2013-08-07 MED ORDER — PREDNISONE 50 MG PO TABS
60.0000 mg | ORAL_TABLET | Freq: Once | ORAL | Status: AC
  Administered 2013-08-07: 60 mg via ORAL
  Filled 2013-08-07 (×2): qty 1

## 2013-08-07 MED ORDER — PREDNISONE 20 MG PO TABS: ORAL_TABLET | ORAL | Status: DC

## 2013-08-07 MED ORDER — HYDROCODONE-ACETAMINOPHEN 5-325 MG PO TABS: 1.0000 | ORAL_TABLET | Freq: Four times a day (QID) | ORAL | Status: DC | PRN

## 2013-08-07 NOTE — ED Notes (Signed)
rx x 3 given at d/c for prednisone, meloxicam and hydrocodone

## 2013-08-07 NOTE — ED Provider Notes (Signed)
CSN: [id number]     Arrival date & time 08/07/13  0049 History   First MD Initiated Contact with Patient 08/07/13 0118     Chief Complaint  Patient presents with  . Gout     (Consider location/radiation/quality/duration/timing/severity/associated sxs/prior Treatment) Patient is a 48 y.o. male presenting with extremity pain. The history is provided by the patient.  Extremity Pain This is a recurrent problem. The current episode started yesterday. The problem occurs constantly. The problem has not changed since onset.Pertinent negatives include no chest pain, no abdominal pain, no headaches and no shortness of breath. Nothing aggravates the symptoms. Nothing relieves the symptoms. He has tried nothing for the symptoms. The treatment provided no relief.  Swelling and pain in the right knee and left elbow consistent with his previous gout  Past Medical History  Diagnosis Date  . Gout   . Hypertension   . Restless leg syndrome   . Diabetes mellitus without complication    Past Surgical History  Procedure Laterality Date  . Anterior cruciate ligament repair    . Humerus surgery     No family history on file. History  Substance Use Topics  . Smoking status: Never Smoker   . Smokeless tobacco: Never Used  . Alcohol Use: No    Review of Systems  Respiratory: Negative for shortness of breath.   Cardiovascular: Negative for chest pain.  Gastrointestinal: Negative for abdominal pain.  Neurological: Negative for headaches.  All other systems reviewed and are negative.     Allergies  Review of patient's allergies indicates no known allergies.  Home Medications   Prior to Admission medications   Medication Sig Start Date End Date Taking? Authorizing Provider  allopurinol (ZYLOPRIM) 100 MG tablet Take 1.5 tablets (150 mg total) by mouth daily. 06/27/13  Yes Arick Mareno K Kately Graffam-Rasch, MD  allopurinol (ZYLOPRIM) 300 MG tablet Take 300 mg by mouth daily.   Yes Historical Provider, MD   lisinopril (PRINIVIL,ZESTRIL) 10 MG tablet Take 10 mg by mouth daily.   Yes Historical Provider, MD  ROPINIRole HCl (REQUIP PO) Take by mouth.   Yes Historical Provider, MD  allopurinol (ZYLOPRIM) 300 MG tablet Take 1 tablet (300 mg total) by mouth daily. 05/12/13   Kaitlyn Szekalski, PA-C  amoxicillin (AMOXIL) 500 MG capsule Take 1 capsule (500 mg total) by mouth 3 (three) times daily. 05/03/13   Blanchie Dessert, MD  HYDROcodone-acetaminophen (NORCO) 5-325 MG per tablet Take 1 tablet by mouth every 6 (six) hours as needed. 08/07/13   Sansa Alkema K Charlena Haub-Rasch, MD  HYDROcodone-acetaminophen (NORCO/VICODIN) 5-325 MG per tablet Take 1 tablet by mouth every 4 (four) hours as needed. 03/14/13   Houston Siren III, MD  HYDROcodone-homatropine [facility]) 5-1.5 MG/5ML syrup Take 5 mLs by mouth every 6 (six) hours as needed for cough. 05/03/13   Blanchie Dessert, MD  indomethacin (INDOCIN) 25 MG capsule Take 1 capsule (25 mg total) by mouth 3 (three) times daily with meals. May take up to 50mg  three times a day if no improvement with 25mg . 09/14/12   Johnna Acosta, MD  indomethacin (INDOCIN) 25 MG capsule Take 1 capsule (25 mg total) by mouth 3 (three) times daily with meals. May take up to 50mg  three times a day if no improvement with 25mg . 05/12/13   Alvina Chou, PA-C  meloxicam (MOBIC) 7.5 MG tablet Take 1 tablet (7.5 mg total) by mouth daily. 08/07/13   Lasheika Ortloff K Lindel Marcell-Rasch, MD  oxyCODONE-acetaminophen (PERCOCET) 5-325 MG per tablet Take 1 tablet by mouth  every 6 (six) hours as needed. 06/27/13   Ifeoma Vallin K Claudett Bayly-Rasch, MD  oxyCODONE-acetaminophen (PERCOCET/ROXICET) 5-325 MG per tablet Take 1 tablet by mouth every 4 (four) hours as needed for pain. 09/25/12   Larene Pickett, PA-C  predniSONE (DELTASONE) 10 MG tablet Take 40 mg by mouth daily for 3 days, then 20mg  by mouth daily for 3 days, then 10mg  daily for 3 days 03/14/13   Houston Siren III, MD  predniSONE (DELTASONE) 10 MG tablet Take 5 tabs po on  days days one through 3, then 3 tabs po on days 4 through 6, then 1 tab po on days 7-10 06/27/13   Rosalita Carey K Carrera Kiesel-Rasch, MD  predniSONE (DELTASONE) 20 MG tablet Take 2 tablets (40 mg total) by mouth daily. Take 40 mg by mouth daily for 3 days, then 20mg  by mouth daily for 3 days, then 10mg  daily for 3 days 09/25/12   Larene Pickett, PA-C  predniSONE (DELTASONE) 20 MG tablet Take 2 tablets (40 mg total) by mouth daily. Take 40 mg by mouth daily for 3 days, then 20mg  by mouth daily for 3 days, then 10mg  daily for 3 days 05/12/13   Alvina Chou, PA-C  predniSONE (DELTASONE) 20 MG tablet 3 tabs po day one, then 2 po daily x 4 days 08/07/13   Nijae Doyel K Valery Amedee-Rasch, MD   BP 184/106  Pulse 104  Temp(Src) 98.3 F (36.8 C) (Oral)  Resp 20  Ht 5\' 10"  (1.778 m)  Wt 300 lb (136.079 kg)  BMI 43.05 kg/m2  SpO2 97% Physical Exam  Constitutional: He is oriented to person, place, and time. He appears well-developed and well-nourished.  HENT:  Head: Normocephalic and atraumatic.  Mouth/Throat: Oropharynx is clear and moist.  Eyes: Conjunctivae and EOM are normal.  Neck: Normal range of motion. Neck supple.  Cardiovascular: Normal rate, regular rhythm and intact distal pulses.   Pulmonary/Chest: Effort normal and breath sounds normal. He has no wheezes. He has no rales.  Abdominal: Soft. Bowel sounds are normal. There is no tenderness.  Musculoskeletal: Normal range of motion.  Swelling of the elbow consistent with gout  Neurological: He is alert and oriented to person, place, and time. He has normal reflexes.  Skin: Skin is warm and dry. No rash noted. No erythema.  Psychiatric: He has a normal mood and affect.    ED Course  Procedures (including critical care time) Labs Review Labs Reviewed - No data to display  Imaging Review No results found.   EKG Interpretation None      MDM   Final diagnoses:  Gouty arthritis    fOLLOW UP WITH YOUR [facility] FOR ONGOING CARE   Laneta Guerin Alfonso Patten, MD 08/07/13 [phone]

## 2013-08-07 NOTE — Discharge Instructions (Signed)

## 2013-08-07 NOTE — ED Notes (Signed)
Reports hx of gout- c/o pain in left elbow and right knee

## 2013-08-07 NOTE — ED Notes (Signed)
MD at bedside. 

## 2013-09-01 ENCOUNTER — Encounter (HOSPITAL_BASED_OUTPATIENT_CLINIC_OR_DEPARTMENT_OTHER): Payer: Self-pay | Admitting: Emergency Medicine

## 2013-09-01 ENCOUNTER — Emergency Department (HOSPITAL_BASED_OUTPATIENT_CLINIC_OR_DEPARTMENT_OTHER)
Admission: EM | Admit: 2013-09-01 | Discharge: 2013-09-01 | Disposition: A | Discharge status: Home / Self Care | Payer: Managed Care, Other (non HMO) | Attending: Emergency Medicine | Admitting: Emergency Medicine

## 2013-09-01 DIAGNOSIS — M109 Gout, unspecified: Secondary | ICD-10-CM | POA: Insufficient documentation

## 2013-09-01 DIAGNOSIS — Z79899 Other long term (current) drug therapy: Secondary | ICD-10-CM | POA: Insufficient documentation

## 2013-09-01 DIAGNOSIS — Z792 Long term (current) use of antibiotics: Secondary | ICD-10-CM | POA: Insufficient documentation

## 2013-09-01 DIAGNOSIS — I10 Essential (primary) hypertension: Secondary | ICD-10-CM | POA: Insufficient documentation

## 2013-09-01 DIAGNOSIS — E119 Type 2 diabetes mellitus without complications: Secondary | ICD-10-CM | POA: Insufficient documentation

## 2013-09-01 DIAGNOSIS — Z791 Long term (current) use of non-steroidal anti-inflammatories (NSAID): Secondary | ICD-10-CM | POA: Insufficient documentation

## 2013-09-01 DIAGNOSIS — IMO0002 Reserved for concepts with insufficient information to code with codable children: Secondary | ICD-10-CM | POA: Insufficient documentation

## 2013-09-01 DIAGNOSIS — Z8669 Personal history of other diseases of the nervous system and sense organs: Secondary | ICD-10-CM | POA: Insufficient documentation

## 2013-09-01 MED ORDER — OXYCODONE-ACETAMINOPHEN 5-325 MG PO TABS: 1.0000 | ORAL_TABLET | ORAL | Status: DC | PRN

## 2013-09-01 MED ORDER — METHYLPREDNISOLONE SODIUM SUCC 125 MG IJ SOLR
125.0000 mg | Freq: Once | INTRAMUSCULAR | Status: AC
  Administered 2013-09-01: 125 mg via INTRAMUSCULAR
  Filled 2013-09-01: qty 2

## 2013-09-01 NOTE — Discharge Instructions (Signed)

## 2013-09-01 NOTE — ED Notes (Signed)
Pt with known gout to left wrist, wrist red, [place], painful to touch, followed by dr. Sherren Mocha at [facility], told not to take allupurinol until this attack passes, usually has to have solu medrol to help contol pain

## 2013-09-01 NOTE — ED Notes (Signed)
Increased swelling, redness and pain to left hand and wrist x 2 days  Denies inj

## 2013-09-01 NOTE — ED Provider Notes (Signed)
CSN: [id number]     Arrival date & time 09/01/13  0027 History   First MD Initiated Contact with Patient 09/01/13 0044     Chief Complaint  Patient presents with  . Wrist Pain     (Consider location/radiation/quality/duration/timing/severity/associated sxs/prior Treatment) HPI This is a 48 year old male with a history of gout. He is here with a two-day history of pain in his left wrist and left metacarpophalangeal joints. He characterizes the pain is like prior to her gout attacks and he has had multiple gout attacks in that hand. There is associated swelling of the dorsal left hand with some mild erythema. He denies fever or chills. He describes the pain as severe, worse with movement or palpation. He is taking only Aleve for it and is requesting a shot of Solu-Medrol. He has an appointment with his primary care physician tomorrow. He is being referred to a rheumatologist.  Past Medical History  Diagnosis Date  . Gout   . Hypertension   . Restless leg syndrome   . Diabetes mellitus without complication    Past Surgical History  Procedure Laterality Date  . Anterior cruciate ligament repair    . Humerus surgery     History reviewed. No pertinent family history. History  Substance Use Topics  . Smoking status: Never Smoker   . Smokeless tobacco: Never Used  . Alcohol Use: No    Review of Systems  All other systems reviewed and are negative.  Allergies  Review of patient's allergies indicates no known allergies.  Home Medications   Prior to Admission medications   Medication Sig Start Date End Date Taking? Authorizing Provider  lisinopril (PRINIVIL,ZESTRIL) 10 MG tablet Take 10 mg by mouth daily.   Yes Historical Provider, MD  metFORMIN (GLUCOPHAGE) 1000 MG tablet Take 1,000 mg by mouth 2 (two) times daily with a meal.   Yes Historical Provider, MD  ROPINIRole HCl (REQUIP PO) Take by mouth.   Yes Historical Provider, MD  allopurinol (ZYLOPRIM) 100 MG tablet Take 1.5  tablets (150 mg total) by mouth daily. 06/27/13   April K Palumbo-Rasch, MD  allopurinol (ZYLOPRIM) 300 MG tablet Take 300 mg by mouth daily.    Historical Provider, MD  allopurinol (ZYLOPRIM) 300 MG tablet Take 1 tablet (300 mg total) by mouth daily. 05/12/13   Kaitlyn Szekalski, PA-C  amoxicillin (AMOXIL) 500 MG capsule Take 1 capsule (500 mg total) by mouth 3 (three) times daily. 05/03/13   Blanchie Dessert, MD  HYDROcodone-acetaminophen (NORCO) 5-325 MG per tablet Take 1 tablet by mouth every 6 (six) hours as needed. 08/07/13   April K Palumbo-Rasch, MD  HYDROcodone-acetaminophen (NORCO/VICODIN) 5-325 MG per tablet Take 1 tablet by mouth every 4 (four) hours as needed. 03/14/13   Houston Siren III, MD  HYDROcodone-homatropine [facility]) 5-1.5 MG/5ML syrup Take 5 mLs by mouth every 6 (six) hours as needed for cough. 05/03/13   Blanchie Dessert, MD  indomethacin (INDOCIN) 25 MG capsule Take 1 capsule (25 mg total) by mouth 3 (three) times daily with meals. May take up to 50mg  three times a day if no improvement with 25mg . 09/14/12   Johnna Acosta, MD  indomethacin (INDOCIN) 25 MG capsule Take 1 capsule (25 mg total) by mouth 3 (three) times daily with meals. May take up to 50mg  three times a day if no improvement with 25mg . 05/12/13   Alvina Chou, PA-C  meloxicam (MOBIC) 7.5 MG tablet Take 1 tablet (7.5 mg total) by mouth daily. 08/07/13   April  Alfonso Patten, MD  oxyCODONE-acetaminophen (PERCOCET) 5-325 MG per tablet Take 1 tablet by mouth every 6 (six) hours as needed. 06/27/13   April K Palumbo-Rasch, MD  oxyCODONE-acetaminophen (PERCOCET/ROXICET) 5-325 MG per tablet Take 1 tablet by mouth every 4 (four) hours as needed for pain. 09/25/12   Larene Pickett, PA-C  predniSONE (DELTASONE) 10 MG tablet Take 40 mg by mouth daily for 3 days, then 20mg  by mouth daily for 3 days, then 10mg  daily for 3 days 03/14/13   Houston Siren III, MD  predniSONE (DELTASONE) 10 MG tablet Take 5 tabs po on days days  one through 3, then 3 tabs po on days 4 through 6, then 1 tab po on days 7-10 06/27/13   April K Palumbo-Rasch, MD  predniSONE (DELTASONE) 20 MG tablet Take 2 tablets (40 mg total) by mouth daily. Take 40 mg by mouth daily for 3 days, then 20mg  by mouth daily for 3 days, then 10mg  daily for 3 days 09/25/12   Larene Pickett, PA-C  predniSONE (DELTASONE) 20 MG tablet Take 2 tablets (40 mg total) by mouth daily. Take 40 mg by mouth daily for 3 days, then 20mg  by mouth daily for 3 days, then 10mg  daily for 3 days 05/12/13   Alvina Chou, PA-C  predniSONE (DELTASONE) 20 MG tablet 3 tabs po day one, then 2 po daily x 4 days 08/07/13   April K Palumbo-Rasch, MD   BP 180/116  Pulse 122  Temp(Src) 98.3 F (36.8 C) (Oral)  Resp 22  Ht 5\' 10"  (1.778 m)  Wt 290 lb (131.543 kg)  BMI 41.61 kg/m2  SpO2 99%  Physical Exam General: Well-developed, well-nourished male in no acute distress; appearance consistent with age of record HENT: normocephalic; atraumatic Eyes: pupils equal, round and reactive to light; extraocular muscles intact Neck: supple Heart: regular rate and rhythm Lungs: clear to auscultation bilaterally Abdomen: soft; nondistended; nontender Extremities: No deformity; full range of motion; pulses normal; swelling, tenderness, erythema and decreased range of motion of left wrist and hand as shown:   Neurologic: Awake, alert and oriented; motor function intact in all extremities and symmetric; no facial droop Skin: Warm and dry Psychiatric: Normal mood and affect    ED Course  Procedures (including critical care time)   MDM  Patient advised not to take NSAIDs once he is given Solu-Medrol to decrease chance of GI bleed.   Wynetta Fines, MD 09/01/13 [phone]

## 2013-09-25 ENCOUNTER — Emergency Department (HOSPITAL_COMMUNITY)
Admission: EM | Admit: 2013-09-25 | Discharge: 2013-09-25 | Disposition: A | Discharge status: Home / Self Care | Payer: Managed Care, Other (non HMO) | Attending: Emergency Medicine | Admitting: Emergency Medicine

## 2013-09-25 ENCOUNTER — Encounter (HOSPITAL_COMMUNITY): Payer: Self-pay | Admitting: Emergency Medicine

## 2013-09-25 DIAGNOSIS — Z791 Long term (current) use of non-steroidal anti-inflammatories (NSAID): Secondary | ICD-10-CM | POA: Insufficient documentation

## 2013-09-25 DIAGNOSIS — Z8669 Personal history of other diseases of the nervous system and sense organs: Secondary | ICD-10-CM | POA: Insufficient documentation

## 2013-09-25 DIAGNOSIS — Z79899 Other long term (current) drug therapy: Secondary | ICD-10-CM | POA: Insufficient documentation

## 2013-09-25 DIAGNOSIS — E119 Type 2 diabetes mellitus without complications: Secondary | ICD-10-CM | POA: Insufficient documentation

## 2013-09-25 DIAGNOSIS — M109 Gout, unspecified: Secondary | ICD-10-CM

## 2013-09-25 DIAGNOSIS — I10 Essential (primary) hypertension: Secondary | ICD-10-CM | POA: Insufficient documentation

## 2013-09-25 MED ORDER — ACETAMINOPHEN 325 MG PO TABS
650.0000 mg | ORAL_TABLET | Freq: Four times a day (QID) | ORAL | Status: DC | PRN
  Administered 2013-09-25: 650 mg via ORAL
  Filled 2013-09-25: qty 2

## 2013-09-25 MED ORDER — OXYCODONE-ACETAMINOPHEN 5-325 MG PO TABS
2.0000 | ORAL_TABLET | Freq: Once | ORAL | Status: AC
  Administered 2013-09-25: 2 via ORAL
  Filled 2013-09-25: qty 2

## 2013-09-25 MED ORDER — OXYCODONE-ACETAMINOPHEN 5-325 MG PO TABS: 2.0000 | ORAL_TABLET | ORAL | Status: DC | PRN

## 2013-09-25 MED ORDER — PREDNISONE 10 MG PO TABS: ORAL_TABLET | ORAL | Status: DC

## 2013-09-25 MED ORDER — METHYLPREDNISOLONE SODIUM SUCC 125 MG IJ SOLR
125.0000 mg | Freq: Once | INTRAMUSCULAR | Status: AC
  Administered 2013-09-25: 125 mg via INTRAMUSCULAR
  Filled 2013-09-25: qty 2

## 2013-09-25 NOTE — Discharge Instructions (Signed)
Gout Gout is when your joints become red, sore, and swell (inflammed). This is caused by the buildup of uric acid crystals in the joints. Uric acid is a chemical that is normally in the blood. If the level of uric acid gets too high in the blood, these crystals form in your joints and tissues. Over time, these crystals can form into masses near the joints and tissues. These masses can destroy bone and cause the bone to look misshapen (deformed). HOME CARE   Do not take aspirin for pain.  Only take medicine as told by your doctor.  Rest the joint as much as you can. When in bed, keep sheets and blankets off painful areas.  Keep the sore joints raised (elevated).  Put warm or cold packs on painful joints. Use of warm or cold packs depends on which works best for you.  Use crutches if the painful joint is in your leg.  Drink enough fluids to keep your pee (urine) clear or pale yellow. Limit alcohol, sugary drinks, and drinks with fructose in them.  Follow your diet instructions. Pay careful attention to how much protein you eat. Include fruits, vegetables, whole grains, and fat-free or low-fat milk products in your daily diet. Talk to your doctor or dietician about the use of coffee, vitamin C, and cherries. These may help lower uric acid levels.  Keep a healthy body weight. GET HELP RIGHT AWAY IF:   You have watery poop (diarrhea), throw up (vomit), or have any side effects from medicines.  You do not feel better in 24 hours, or you are getting worse.  Your joint becomes suddenly more tender, and you have chills or a fever. MAKE SURE YOU:   Understand these instructions.  Will watch your condition.  Will get help right away if you are not doing well or get worse. Document Released: 01/15/2008 Document Revised: 08/02/2012 Document Reviewed: 07/16/2009 [facility] Patient Information 2014 [place].  Purine Restricted Diet A low-purine diet consists of foods that reduce uric  acid made in your body. INDICATIONS FOR USE  Your caregiver may ask you to follow a low-purine diet to reduce gout flairs.  GUIDELINES  Avoid high-purine foods, including all alcohol, yeast extracts taken as supplements, and sauces made from meats (like gravy). Do not eat high-purine meats, including anchovies, sardines, herring, mussels, tuna, codfish, scallops, trout, haddock, bacon, organ meats, tripe, goose, wild game, and sweetbreads.  Grains  Allowed/Recommended: All, except those listed to consume in moderation.  Consume in Moderation: Oatmeal ( cup uncooked daily), wheat bran or germ ( cup daily), and whole grains. Vegetables  Allowed/Recommended: All, except those listed to consume in moderation.  Consume in Moderation: Asparagus, cauliflower, spinach, mushrooms, and green peas ( cup daily). Fruit  Allowed/Recommended: All.  Consume in Moderation: None. Meat and Meat Substitutes  Allowed/Recommended: Eggs, nuts, and peanut butter.  Consume in Moderation: Limit to 4 to 6 oz daily. Avoid high-purine meats. Lentils, peas, and dried beans (1 cup daily). Milk  Allowed/Recommended: All. Choose low-fat or skim when possible.  Consume in Moderation: None. Fats and Oils  Allowed/Recommended: All.  Consume in Moderation: None. Beverages  Allowed/Recommended: All, except those listed to avoid.  Avoid: All alcohol. Condiments/Miscellaneous  Allowed/Recommended: All, except those listed to consume in moderation.  Consume in Moderation: Bouillon and meat-based broths and soups. Document Released: 08/02/2010 Document Revised: 06/30/2011 Document Reviewed: 08/02/2010 [facility] Patient Information 2014 [place].

## 2013-09-25 NOTE — ED Provider Notes (Signed)
CSN: [id number]     Arrival date & time 09/25/13  1957 History   First MD Initiated Contact with Patient 09/25/13 2125     Chief Complaint  Patient presents with  . Gout     (Consider location/radiation/quality/duration/timing/severity/associated sxs/prior Treatment) HPI Comments: He complains of multiple joint pain consistent with his history of gout. No fever. Left elbow, right knee and bilateral hips affected this episode. He recently finished a 5-day taper dose of prednisone and states it improved but did not resolve the exacerbation. He states diagnosis has been made on multiple occasions by arthrocentesis.   The history is provided by the patient. No language interpreter was used.    Past Medical History  Diagnosis Date  . Gout   . Hypertension   . Restless leg syndrome   . Diabetes mellitus without complication    Past Surgical History  Procedure Laterality Date  . Anterior cruciate ligament repair    . Humerus surgery     No family history on file. History  Substance Use Topics  . Smoking status: Never Smoker   . Smokeless tobacco: Never Used  . Alcohol Use: No    Review of Systems  Constitutional: Negative for fever and chills.  Genitourinary: Negative.   Musculoskeletal: Positive for arthralgias and joint swelling. Negative for myalgias.  Skin: Negative.  Negative for color change.  Neurological: Negative.       Allergies  Review of patient's allergies indicates no known allergies.  Home Medications   Prior to Admission medications   Medication Sig Start Date End Date Taking? Authorizing Provider  ibuprofen (ADVIL,MOTRIN) 200 MG tablet Take 200 mg by mouth every 6 (six) hours as needed (pain).   Yes Historical Provider, MD  lisinopril (PRINIVIL,ZESTRIL) 40 MG tablet Take 40 mg by mouth daily.   Yes Historical Provider, MD  metFORMIN (GLUCOPHAGE-XR) 500 MG 24 hr tablet Take 500 mg by mouth daily with breakfast.   Yes Historical Provider, MD  naproxen  sodium (ANAPROX) 220 MG tablet Take 220 mg by mouth 2 (two) times daily with a meal.   Yes Historical Provider, MD  rOPINIRole (REQUIP) 4 MG tablet Take 4 mg by mouth at bedtime.   Yes Historical Provider, MD   BP 197/105  Pulse 103  Temp(Src) 100.4 F (38 C) (Oral)  Resp 20  SpO2 100% Physical Exam  Constitutional: He is oriented to person, place, and time. He appears well-developed and well-nourished.  Neck: Normal range of motion.  Cardiovascular: Intact distal pulses.   Pulmonary/Chest: Effort normal.  Musculoskeletal: Normal range of motion.  Left elbow and right knee are mildly swollen, tender. FROM. No redness or warmth.   Neurological: He is alert and oriented to person, place, and time.  Skin: Skin is warm and dry.  Psychiatric: He has a normal mood and affect.    ED Course  Procedures (including critical care time) Labs Review Labs Reviewed - No data to display  Imaging Review No results found.   EKG Interpretation None      MDM   Final diagnoses:  None    1. Gout  Patient with a history of persistent/recurrent gout attacks, currently followed by PCP and just referred to rheumatology secondary to number of recurrences.     Dewaine Oats, PA-C 09/25/13 2203

## 2013-09-25 NOTE — ED Notes (Signed)
Per pt report: Pt c/o gout pain in his elbows, hips, and right knee.  Pain has become worse today.  Pt denies any other symptoms. Pt a/o x 4. Skin warm and dry.

## 2013-09-28 NOTE — ED Provider Notes (Signed)
Medical screening examination/treatment/procedure(s) were performed by non-physician practitioner and as supervising physician I was immediately available for consultation/collaboration.   Dorie Rank, MD 09/28/13 [phone]

## 2013-11-23 ENCOUNTER — Emergency Department (HOSPITAL_COMMUNITY)
Admission: EM | Admit: 2013-11-23 | Discharge: 2013-11-23 | Disposition: A | Discharge status: Home / Self Care | Payer: Managed Care, Other (non HMO) | Attending: Emergency Medicine | Admitting: Emergency Medicine

## 2013-11-23 ENCOUNTER — Encounter (HOSPITAL_COMMUNITY): Payer: Self-pay | Admitting: Emergency Medicine

## 2013-11-23 DIAGNOSIS — E119 Type 2 diabetes mellitus without complications: Secondary | ICD-10-CM | POA: Insufficient documentation

## 2013-11-23 DIAGNOSIS — Z791 Long term (current) use of non-steroidal anti-inflammatories (NSAID): Secondary | ICD-10-CM | POA: Insufficient documentation

## 2013-11-23 DIAGNOSIS — M79609 Pain in unspecified limb: Secondary | ICD-10-CM | POA: Insufficient documentation

## 2013-11-23 DIAGNOSIS — Z8669 Personal history of other diseases of the nervous system and sense organs: Secondary | ICD-10-CM | POA: Insufficient documentation

## 2013-11-23 DIAGNOSIS — I10 Essential (primary) hypertension: Secondary | ICD-10-CM | POA: Insufficient documentation

## 2013-11-23 DIAGNOSIS — Z79899 Other long term (current) drug therapy: Secondary | ICD-10-CM | POA: Insufficient documentation

## 2013-11-23 DIAGNOSIS — M109 Gout, unspecified: Secondary | ICD-10-CM

## 2013-11-23 MED ORDER — PREDNISONE 20 MG PO TABS
60.0000 mg | ORAL_TABLET | Freq: Once | ORAL | Status: AC
  Administered 2013-11-23: 60 mg via ORAL
  Filled 2013-11-23: qty 3

## 2013-11-23 MED ORDER — PREDNISONE 10 MG PO TABS: ORAL_TABLET | ORAL | Status: DC

## 2013-11-23 MED ORDER — HYDROCODONE-ACETAMINOPHEN 5-325 MG PO TABS
1.0000 | ORAL_TABLET | Freq: Once | ORAL | Status: AC
  Administered 2013-11-23: 1 via ORAL
  Filled 2013-11-23: qty 1

## 2013-11-23 MED ORDER — HYDROCODONE-ACETAMINOPHEN 5-325 MG PO TABS: 1.0000 | ORAL_TABLET | Freq: Four times a day (QID) | ORAL | Status: DC | PRN

## 2013-11-23 NOTE — Discharge Instructions (Signed)
Gout °Gout is when your joints become red, sore, and swell (inflamed). This is caused by the buildup of uric acid crystals in the joints. Uric acid is a chemical that is normally in the blood. If the level of uric acid gets too high in the blood, these crystals form in your joints and tissues. Over time, these crystals can form into masses near the joints and tissues. These masses can destroy bone and cause the bone to look misshapen (deformed). °HOME CARE  °· Do not take aspirin for pain. °· Only take medicine as told by your doctor. °· Rest the joint as much as you can. When in bed, keep sheets and blankets off painful areas. °· Keep the sore joints raised (elevated). °· Put warm or cold packs on painful joints. Use of warm or cold packs depends on which works best for you. °· Use crutches if the painful joint is in your leg. °· Drink enough fluids to keep your pee (urine) clear or pale yellow. Limit alcohol, sugary drinks, and drinks with fructose in them. °· Follow your diet instructions. Pay careful attention to how much protein you eat. Include fruits, vegetables, whole grains, and fat-free or low-fat milk products in your daily diet. Talk to your doctor or dietitian about the use of coffee, vitamin C, and cherries. These may help lower uric acid levels. °· Keep a healthy body weight. °GET HELP RIGHT AWAY IF:  °· You have watery poop (diarrhea), throw up (vomit), or have any side effects from medicines. °· You do not feel better in 24 hours, or you are getting worse. °· Your joint becomes suddenly more tender, and you have chills or a fever. °MAKE SURE YOU:  °· Understand these instructions. °· Will watch your condition. °· Will get help right away if you are not doing well or get worse. °Document Released: 01/15/2008 Document Revised: 08/22/2013 Document Reviewed: 11/19/2011 °Exit[employer]® Patient Information ©2015 [employer]. This information is not intended to replace advice given to you by your health care  provider. Make sure you discuss any questions you have with your health care provider. ° °

## 2013-11-23 NOTE — ED Provider Notes (Signed)
Medical screening examination/treatment/procedure(s) were performed by non-physician practitioner and as supervising physician I was immediately available for consultation/collaboration.   EKG Interpretation None        Julianne Rice, MD 11/23/13 [phone]

## 2013-11-23 NOTE — ED Notes (Signed)
Patient is alert and oriented x3.  He was given DC instructions and follow up visit instructions.  Patient gave verbal understanding.  He was DC ambulatory under his own power to home.  V/S stable.  He was not showing any signs of distress on DC 

## 2013-11-23 NOTE — ED Notes (Signed)
Patient questioned about his BP.  Patient states that he does have high BP and It elevates during times of gout flare ups.  Provider notified and patient DC

## 2013-11-23 NOTE — ED Provider Notes (Signed)
CSN: [id number]     Arrival date & time 11/23/13  0227 History   First MD Initiated Contact with Patient 11/23/13 [phone]     Chief Complaint  Patient presents with  . Hand Pain     (Consider location/radiation/quality/duration/timing/severity/associated sxs/prior Treatment) Patient is a 48 y.o. male presenting with hand pain. The history is provided by the patient.  Hand Pain This is a new problem. The current episode started today. The problem occurs constantly. The problem has been gradually worsening. Associated symptoms include arthralgias and joint swelling. Pertinent negatives include no fever or rash. The symptoms are aggravated by walking. He has tried nothing for the symptoms. The treatment provided no relief.    Past Medical History  Diagnosis Date  . Gout   . Hypertension   . Restless leg syndrome   . Diabetes mellitus without complication    Past Surgical History  Procedure Laterality Date  . Anterior cruciate ligament repair    . Humerus surgery     History reviewed. No pertinent family history. History  Substance Use Topics  . Smoking status: Never Smoker   . Smokeless tobacco: Never Used  . Alcohol Use: No    Review of Systems  Constitutional: Negative for fever.  Musculoskeletal: Positive for arthralgias and joint swelling.  Skin: Negative for rash.  All other systems reviewed and are negative.     Allergies  Review of patient's allergies indicates no known allergies.  Home Medications   Prior to Admission medications   Medication Sig Start Date End Date Taking? Authorizing Provider  lisinopril (PRINIVIL,ZESTRIL) 40 MG tablet Take 40 mg by mouth daily.   Yes Historical Provider, MD  metFORMIN (GLUCOPHAGE-XR) 500 MG 24 hr tablet Take 500 mg by mouth daily with breakfast.   Yes Historical Provider, MD  naproxen sodium (ANAPROX) 220 MG tablet Take 220 mg by mouth 2 (two) times daily with a meal.   Yes Historical Provider, MD  rOPINIRole (REQUIP) 4 MG  tablet Take 4 mg by mouth at bedtime.   Yes Historical Provider, MD  HYDROcodone-acetaminophen (NORCO/VICODIN) 5-325 MG per tablet Take 1 tablet by mouth every 6 (six) hours as needed for moderate pain. 11/23/13   Garald Balding, NP  predniSONE (DELTASONE) 10 MG tablet Take 6 tabs days 1 and 2 Take 5 tabs days 3 and 4 Take 4 tabs days 5 and 6 Take 3 tabs days 7 and 8 Take 2 tabs days 9 and 10 Take 1 tab days 11 and 12 11/23/13   Garald Balding, NP   BP 198/124  Pulse 102  Temp(Src) 98.8 F (37.1 C) (Oral)  Resp 18  SpO2 98% Physical Exam  Nursing note and vitals reviewed. Constitutional: He appears well-developed and well-nourished.  HENT:  Head: Normocephalic.  Eyes: Pupils are equal, round, and reactive to light.  Neck: Normal range of motion.  Cardiovascular: Normal rate.   Musculoskeletal: Normal range of motion.  Neurological: He is alert.  Skin: Skin is warm.    ED Course  Procedures (including critical care time) Labs Review Labs Reviewed - No data to display  Imaging Review No results found.   EKG Interpretation None      MDM   Final diagnoses:  Acute gout of left wrist, unspecified cause         Garald Balding, NP 11/23/13 6010

## 2013-11-23 NOTE — ED Notes (Signed)
Patient states that he has gout. He started to have this flair up by Monday.

## 2014-02-04 ENCOUNTER — Emergency Department (HOSPITAL_BASED_OUTPATIENT_CLINIC_OR_DEPARTMENT_OTHER)
Admission: EM | Admit: 2014-02-04 | Discharge: 2014-02-04 | Disposition: A | Discharge status: Home / Self Care | Payer: Managed Care, Other (non HMO) | Attending: Emergency Medicine | Admitting: Emergency Medicine

## 2014-02-04 ENCOUNTER — Encounter (HOSPITAL_BASED_OUTPATIENT_CLINIC_OR_DEPARTMENT_OTHER): Payer: Self-pay | Admitting: Emergency Medicine

## 2014-02-04 DIAGNOSIS — G2581 Restless legs syndrome: Secondary | ICD-10-CM | POA: Insufficient documentation

## 2014-02-04 DIAGNOSIS — Z79899 Other long term (current) drug therapy: Secondary | ICD-10-CM | POA: Diagnosis not present

## 2014-02-04 DIAGNOSIS — M25531 Pain in right wrist: Secondary | ICD-10-CM | POA: Diagnosis present

## 2014-02-04 DIAGNOSIS — I10 Essential (primary) hypertension: Secondary | ICD-10-CM | POA: Diagnosis not present

## 2014-02-04 DIAGNOSIS — M10031 Idiopathic gout, right wrist: Secondary | ICD-10-CM | POA: Insufficient documentation

## 2014-02-04 DIAGNOSIS — Z791 Long term (current) use of non-steroidal anti-inflammatories (NSAID): Secondary | ICD-10-CM | POA: Insufficient documentation

## 2014-02-04 DIAGNOSIS — Z9889 Other specified postprocedural states: Secondary | ICD-10-CM | POA: Diagnosis not present

## 2014-02-04 DIAGNOSIS — E119 Type 2 diabetes mellitus without complications: Secondary | ICD-10-CM | POA: Insufficient documentation

## 2014-02-04 DIAGNOSIS — M109 Gout, unspecified: Secondary | ICD-10-CM

## 2014-02-04 MED ORDER — PREDNISONE 10 MG PO TABS: ORAL_TABLET | ORAL | Status: DC

## 2014-02-04 MED ORDER — METHYLPREDNISOLONE SODIUM SUCC 125 MG IJ SOLR
125.0000 mg | Freq: Once | INTRAMUSCULAR | Status: AC
  Administered 2014-02-04: 125 mg via INTRAMUSCULAR
  Filled 2014-02-04: qty 2

## 2014-02-04 MED ORDER — OXYCODONE-ACETAMINOPHEN 5-325 MG PO TABS: 1.0000 | ORAL_TABLET | Freq: Four times a day (QID) | ORAL | Status: DC | PRN

## 2014-02-04 MED ORDER — PREDNISONE 20 MG PO TABS: ORAL_TABLET | ORAL | Status: DC

## 2014-02-04 NOTE — ED Provider Notes (Signed)
CSN: [id number]     Arrival date & time 02/04/14  9622 History   First MD Initiated Contact with Patient 02/04/14 0325     Chief Complaint  Patient presents with  . Wrist Pain     (Consider location/radiation/quality/duration/timing/severity/associated sxs/prior Treatment) HPI This 48 year old male with a history of gout. He has been seen multiple times for this in the ED. He is here with about a 24-hour history of pain and swelling in his right wrist and hand characterized as like prior episodes of gout. The pain is worst in the wrist joint itself, worse with movement or palpation. Pain is moderate to severe. There is edema distally with mild erythema to the dorsum of the right hand.  Past Medical History  Diagnosis Date  . Gout   . Hypertension   . Restless leg syndrome   . Diabetes mellitus without complication    Past Surgical History  Procedure Laterality Date  . Anterior cruciate ligament repair    . Humerus surgery     History reviewed. No pertinent family history. History  Substance Use Topics  . Smoking status: Never Smoker   . Smokeless tobacco: Never Used  . Alcohol Use: No    Review of Systems  All other systems reviewed and are negative.   Allergies  Review of patient's allergies indicates no known allergies.  Home Medications   Prior to Admission medications   Medication Sig Start Date End Date Taking? Authorizing Provider  allopurinol (ZYLOPRIM) 300 MG tablet Take 300 mg by mouth daily.   Yes Historical Provider, MD  cloNIDine (CATAPRES) 0.1 MG tablet Take 0.1 mg by mouth 2 (two) times daily.   Yes Historical Provider, MD  lisinopril (PRINIVIL,ZESTRIL) 40 MG tablet Take 40 mg by mouth daily.   Yes Historical Provider, MD  naproxen sodium (ANAPROX) 220 MG tablet Take 220 mg by mouth 2 (two) times daily with a meal.   Yes Historical Provider, MD  rOPINIRole (REQUIP) 4 MG tablet Take 4 mg by mouth at bedtime.   Yes Historical Provider, MD   HYDROcodone-acetaminophen (NORCO/VICODIN) 5-325 MG per tablet Take 1 tablet by mouth every 6 (six) hours as needed for moderate pain. 11/23/13   Garald Balding, NP  metFORMIN (GLUCOPHAGE-XR) 500 MG 24 hr tablet Take 500 mg by mouth daily with breakfast.    Historical Provider, MD  predniSONE (DELTASONE) 10 MG tablet Take 6 tabs days 1 and 2 Take 5 tabs days 3 and 4 Take 4 tabs days 5 and 6 Take 3 tabs days 7 and 8 Take 2 tabs days 9 and 10 Take 1 tab days 11 and 12 11/23/13   Garald Balding, NP   BP 206/109  Pulse 106  Temp(Src) 98.4 F (36.9 C) (Oral)  Resp 20  SpO2 95%  Physical Exam General: Well-developed, well-nourished male in no acute distress; appearance consistent with age of record HENT: normocephalic; atraumatic Eyes: pupils equal, round and reactive to light; extraocular muscles intact Neck: supple Heart: regular rate and rhythm Lungs: clear to auscultation bilaterally Abdomen: soft; nondistended; nontender; bowel sounds present Extremities: No deformity; edema of right hand with mild dorsal erythema; tenderness and pain on attempted range of motion of right wrist Neurologic: Awake, alert and oriented; motor function intact in all extremities and symmetric; no facial droop Skin: Warm and dry Psychiatric: Normal mood and affect    ED Course  Procedures (including critical care time)  MDM     Wynetta Fines, MD 02/04/14 [phone]

## 2014-02-04 NOTE — Discharge Instructions (Signed)

## 2014-02-04 NOTE — ED Notes (Signed)
Pt states he started to develop left wrist and hand pain yesterday am and pain has gotten progressively worse, denies injury, states it is a gout attack

## 2014-02-04 NOTE — ED Notes (Signed)
Pt reports that he was just started on clonidine this last week for bp and did not take medication tonight

## 2014-02-10 ENCOUNTER — Other Ambulatory Visit (INDEPENDENT_AMBULATORY_CARE_PROVIDER_SITE_OTHER): Payer: Self-pay | Admitting: General Surgery

## 2014-02-20 ENCOUNTER — Other Ambulatory Visit (HOSPITAL_COMMUNITY): Payer: Self-pay | Admitting: *Deleted

## 2014-02-20 NOTE — Patient Instructions (Addendum)
02/20/2014   Your procedure is scheduled on: Monday November 9th, 2015  Report to [facility]  Entrance and follow signs to  [facility] at 830 AM.  Call this number if you have problems the morning of surgery [phone]   Remember:  Do not eat food or drink liquids :After Midnight.     Take these medicines the morning of surgery with A SIP OF WATER: allopurinol, clonidine                               You may not have any metal on your body including hair pins and piercings  Do not wear jewelry, make-up, lotions, powders, or deodorant.   Men may shave face and neck.  Do not bring valuables to the hospital. [place].  Contacts, dentures or bridgework may not be worn into surgery.  Leave suitcase in the car. After surgery it may be brought to your room. ________________________________________________________________________  [facility] - Preparing for Surgery Before surgery, you can play an important role.  Because skin is not sterile, your skin needs to be as free of germs as possible.  You can reduce the number of germs on your skin by washing with CHG (chlorahexidine gluconate) soap before surgery.  CHG is an antiseptic cleaner which kills germs and bonds with the skin to continue killing germs even after washing. Please DO NOT use if you have an allergy to CHG or antibacterial soaps.  If your skin becomes reddened/irritated stop using the CHG and inform your nurse when you arrive at Short Stay. Do not shave (including legs and underarms) for at least 48 hours prior to the first CHG shower.  You may shave your face/neck. Please follow these instructions carefully:  1.  Shower with CHG Soap the night before surgery and the  morning of Surgery.  2.  If you choose to wash your hair, wash your hair first as usual with your  normal  shampoo.  3.  After you shampoo, rinse your hair and body thoroughly to  remove the  shampoo.                           4.  Use CHG as you would any other liquid soap.  You can apply chg directly  to the skin and wash                       Gently with a scrungie or clean washcloth.  5.  Apply the CHG Soap to your body ONLY FROM THE NECK DOWN.   Do not use on face/ open                           Wound or open sores. Avoid contact with eyes, ears mouth and genitals (private parts).                       Wash face,  Genitals (private parts) with your normal soap.             6.  Wash thoroughly, paying special attention to the area where your surgery  will be performed.  7.  Thoroughly rinse your body with warm water from the neck down.  8.  DO NOT shower/wash  with your normal soap after using and rinsing off  the CHG Soap.                9.  Pat yourself dry with a clean towel.            10.  Wear clean pajamas.            11.  Place clean sheets on your bed the night of your first shower and do not  sleep with pets. Day of Surgery : Do not apply any lotions/deodorants the morning of surgery.  Please wear clean clothes to the hospital/surgery center.  FAILURE TO FOLLOW THESE INSTRUCTIONS MAY RESULT IN THE CANCELLATION OF YOUR SURGERY PATIENT SIGNATURE_________________________________  NURSE SIGNATURE__________________________________  ________________________________________________________________________

## 2014-02-21 ENCOUNTER — Encounter (HOSPITAL_COMMUNITY)
Admission: RE | Admit: 2014-02-21 | Discharge: 2014-02-21 | Disposition: A | Discharge status: Home / Self Care | Payer: Managed Care, Other (non HMO) | Source: Ambulatory Visit | Attending: General Surgery | Admitting: General Surgery

## 2014-02-21 ENCOUNTER — Ambulatory Visit (HOSPITAL_COMMUNITY)
Admission: RE | Admit: 2014-02-21 | Discharge: 2014-02-21 | Disposition: A | Discharge status: Home / Self Care | Payer: Managed Care, Other (non HMO) | Source: Ambulatory Visit | Attending: Anesthesiology | Admitting: Anesthesiology

## 2014-02-21 ENCOUNTER — Encounter (HOSPITAL_COMMUNITY): Payer: Self-pay

## 2014-02-21 DIAGNOSIS — I10 Essential (primary) hypertension: Secondary | ICD-10-CM | POA: Diagnosis not present

## 2014-02-21 DIAGNOSIS — Z01810 Encounter for preprocedural cardiovascular examination: Secondary | ICD-10-CM | POA: Diagnosis not present

## 2014-02-21 DIAGNOSIS — Z01812 Encounter for preprocedural laboratory examination: Secondary | ICD-10-CM | POA: Insufficient documentation

## 2014-02-21 DIAGNOSIS — R9431 Abnormal electrocardiogram [ECG] [EKG]: Secondary | ICD-10-CM | POA: Insufficient documentation

## 2014-02-21 DIAGNOSIS — Z01811 Encounter for preprocedural respiratory examination: Secondary | ICD-10-CM | POA: Insufficient documentation

## 2014-02-21 HISTORY — DX: Unspecified osteoarthritis, unspecified site: M19.90

## 2014-02-21 HISTORY — DX: Adverse effect of unspecified anesthetic, initial encounter: T41.45XA

## 2014-02-21 HISTORY — DX: Fracture of unspecified carpal bone, unspecified wrist, initial encounter for closed fracture: S62.109A

## 2014-02-21 HISTORY — DX: Sleep apnea, unspecified: G47.30

## 2014-02-21 LAB — BASIC METABOLIC PANEL
Anion gap: 13 (ref 5–15)
BUN: 20 mg/dL (ref 6–23)
CO2: 27 mEq/L (ref 19–32)
Calcium: 9.9 mg/dL (ref 8.4–10.5)
Chloride: 99 mEq/L (ref 96–112)
Creatinine, Ser: 1.07 mg/dL (ref 0.50–1.35)
GFR calc Af Amer: >90 mL/min (ref >90)
GFR calc non Af Amer: 79 mL/min — ABNORMAL LOW (ref >90)
Glucose, Bld: 212 mg/dL — ABNORMAL HIGH (ref 70–99)
Potassium: 4.2 mEq/L (ref 3.7–5.3)
Sodium: 139 mEq/L (ref 137–147)

## 2014-02-21 LAB — URINE MICROSCOPIC-ADD ON

## 2014-02-21 LAB — CBC WITH DIFFERENTIAL/PLATELET
Basophils Absolute: 0 10*3/uL (ref 0.0–0.1)
Basophils Relative: 0 % (ref 0–1)
Eosinophils Absolute: 0.2 10*3/uL (ref 0.0–0.7)
Eosinophils Relative: 1 % (ref 0–5)
HCT: 36.6 % — ABNORMAL LOW (ref 39.0–52.0)
Hemoglobin: 12 g/dL — ABNORMAL LOW (ref 13.0–17.0)
Lymphocytes Relative: 24 % (ref 12–46)
Lymphs Abs: 3 10*3/uL (ref 0.7–4.0)
MCH: 25.4 pg — ABNORMAL LOW (ref 26.0–34.0)
MCHC: 32.8 g/dL (ref 30.0–36.0)
MCV: 77.4 fL — ABNORMAL LOW (ref 78.0–100.0)
Monocytes Absolute: 1.1 10*3/uL — ABNORMAL HIGH (ref 0.1–1.0)
Monocytes Relative: 9 % (ref 3–12)
Neutro Abs: 8.3 10*3/uL — ABNORMAL HIGH (ref 1.7–7.7)
Neutrophils Relative %: 66 % (ref 43–77)
Platelets: 246 10*3/uL (ref 150–400)
RBC: 4.73 MIL/uL (ref 4.22–5.81)
RDW: 13.6 % (ref 11.5–15.5)
WBC: 12.6 10*3/uL — ABNORMAL HIGH (ref 4.0–10.5)

## 2014-02-21 LAB — URINALYSIS, ROUTINE W REFLEX MICROSCOPIC
Bilirubin Urine: NEGATIVE
Glucose, UA: >1000 mg/dL — AB
Hgb urine dipstick: NEGATIVE
Ketones, ur: NEGATIVE mg/dL
Leukocytes, UA: NEGATIVE
Nitrite: NEGATIVE
Protein, ur: 30 mg/dL — AB
Specific Gravity, Urine: 1.027 (ref 1.005–1.030)
Urobilinogen, UA: 0.2 mg/dL (ref 0.0–1.0)
pH: 5 (ref 5.0–8.0)

## 2014-02-21 NOTE — Progress Notes (Signed)
Abnormal EKG shown to Dr. Ermalene Postin with anesthesia.  No new orders, pt ok for surgery

## 2014-02-22 NOTE — Progress Notes (Addendum)
Spoke with alicia at dr Cristal Generous office and made aware pt's pre op ekg from 11-21-13 was shown to dr [facility] anesthesia and patient is ok for surgery.

## 2014-02-26 MED ORDER — DEXTROSE 5 % IV SOLN
3.0000 g | INTRAVENOUS | Status: DC
  Filled 2014-02-26: qty 3000

## 2014-02-27 ENCOUNTER — Observation Stay (HOSPITAL_COMMUNITY)
Admission: RE | Admit: 2014-02-27 | Discharge: 2014-03-01 | Disposition: A | Discharge status: Home / Self Care | Payer: Managed Care, Other (non HMO) | Source: Ambulatory Visit | Attending: General Surgery | Admitting: General Surgery

## 2014-02-27 ENCOUNTER — Encounter (HOSPITAL_COMMUNITY): Payer: Self-pay | Admitting: *Deleted

## 2014-02-27 ENCOUNTER — Inpatient Hospital Stay (HOSPITAL_COMMUNITY): Payer: Managed Care, Other (non HMO) | Admitting: *Deleted

## 2014-02-27 ENCOUNTER — Encounter (HOSPITAL_COMMUNITY)
Admission: RE | Disposition: A | Discharge status: Home / Self Care | Payer: Self-pay | Source: Ambulatory Visit | Attending: General Surgery

## 2014-02-27 DIAGNOSIS — I10 Essential (primary) hypertension: Secondary | ICD-10-CM | POA: Insufficient documentation

## 2014-02-27 DIAGNOSIS — M1990 Unspecified osteoarthritis, unspecified site: Secondary | ICD-10-CM | POA: Diagnosis not present

## 2014-02-27 DIAGNOSIS — F419 Anxiety disorder, unspecified: Secondary | ICD-10-CM | POA: Insufficient documentation

## 2014-02-27 DIAGNOSIS — K429 Umbilical hernia without obstruction or gangrene: Principal | ICD-10-CM | POA: Insufficient documentation

## 2014-02-27 DIAGNOSIS — E780 Pure hypercholesterolemia: Secondary | ICD-10-CM | POA: Diagnosis not present

## 2014-02-27 DIAGNOSIS — G4730 Sleep apnea, unspecified: Secondary | ICD-10-CM | POA: Diagnosis not present

## 2014-02-27 DIAGNOSIS — Z79899 Other long term (current) drug therapy: Secondary | ICD-10-CM | POA: Insufficient documentation

## 2014-02-27 DIAGNOSIS — Z9889 Other specified postprocedural states: Secondary | ICD-10-CM

## 2014-02-27 DIAGNOSIS — E119 Type 2 diabetes mellitus without complications: Secondary | ICD-10-CM | POA: Diagnosis not present

## 2014-02-27 DIAGNOSIS — K420 Umbilical hernia with obstruction, without gangrene: Secondary | ICD-10-CM | POA: Diagnosis present

## 2014-02-27 DIAGNOSIS — Z8719 Personal history of other diseases of the digestive system: Secondary | ICD-10-CM

## 2014-02-27 HISTORY — PX: UMBILICAL HERNIA REPAIR: SHX196

## 2014-02-27 LAB — GLUCOSE, CAPILLARY: Glucose-Capillary: 171 mg/dL — ABNORMAL HIGH (ref 70–99)

## 2014-02-27 SURGERY — REPAIR, HERNIA, UMBILICAL, LAPAROSCOPIC
Anesthesia: General | Site: Abdomen

## 2014-02-27 MED ORDER — PHENYLEPHRINE 40 MCG/ML (10ML) SYRINGE FOR IV PUSH (FOR BLOOD PRESSURE SUPPORT)
PREFILLED_SYRINGE | INTRAVENOUS | Status: AC
  Filled 2014-02-27: qty 20

## 2014-02-27 MED ORDER — DEXAMETHASONE SODIUM PHOSPHATE 10 MG/ML IJ SOLN
INTRAMUSCULAR | Status: AC
  Filled 2014-02-27: qty 1

## 2014-02-27 MED ORDER — CISATRACURIUM BESYLATE 20 MG/10ML IV SOLN
INTRAVENOUS | Status: AC
  Filled 2014-02-27: qty 10

## 2014-02-27 MED ORDER — DEXTROSE 5 % IV SOLN
3.0000 g | Freq: Once | INTRAVENOUS | Status: AC
  Administered 2014-02-27: 3 g via INTRAVENOUS
  Filled 2014-02-27: qty 3000

## 2014-02-27 MED ORDER — CEFAZOLIN SODIUM-DEXTROSE 2-3 GM-% IV SOLR
2.0000 g | Freq: Three times a day (TID) | INTRAVENOUS | Status: AC
  Administered 2014-02-27 (×2): 2 g via INTRAVENOUS
  Filled 2014-02-27 (×2): qty 50

## 2014-02-27 MED ORDER — PROMETHAZINE HCL 25 MG/ML IJ SOLN: 6.2500 mg | INTRAMUSCULAR | Status: DC | PRN

## 2014-02-27 MED ORDER — BENAZEPRIL HCL 10 MG PO TABS
10.0000 mg | ORAL_TABLET | Freq: Every day | ORAL | Status: DC
  Administered 2014-02-28 – 2014-03-01 (×2): 10 mg via ORAL
  Filled 2014-02-27 (×2): qty 1

## 2014-02-27 MED ORDER — PROPOFOL 10 MG/ML IV BOLUS
INTRAVENOUS | Status: AC
  Filled 2014-02-27: qty 20

## 2014-02-27 MED ORDER — METOCLOPRAMIDE HCL 5 MG/ML IJ SOLN
INTRAMUSCULAR | Status: AC
  Filled 2014-02-27: qty 2

## 2014-02-27 MED ORDER — METOCLOPRAMIDE HCL 5 MG/ML IJ SOLN
INTRAMUSCULAR | Status: DC | PRN
  Administered 2014-02-27: 10 mg via INTRAVENOUS

## 2014-02-27 MED ORDER — FENTANYL CITRATE 0.05 MG/ML IJ SOLN
INTRAMUSCULAR | Status: DC | PRN
  Administered 2014-02-27 (×2): 50 ug via INTRAVENOUS
  Administered 2014-02-27: 150 ug via INTRAVENOUS

## 2014-02-27 MED ORDER — DEXAMETHASONE SODIUM PHOSPHATE 4 MG/ML IJ SOLN
INTRAMUSCULAR | Status: DC | PRN
  Administered 2014-02-27: 10 mg via INTRAVENOUS

## 2014-02-27 MED ORDER — LIDOCAINE HCL (PF) 2 % IJ SOLN
3.0000 mL | Freq: Once | INTRAMUSCULAR | Status: AC
  Administered 2014-02-27: 11:00:00

## 2014-02-27 MED ORDER — 0.9 % SODIUM CHLORIDE (POUR BTL) OPTIME
TOPICAL | Status: DC | PRN
  Administered 2014-02-27: 1000 mL

## 2014-02-27 MED ORDER — HYDROMORPHONE HCL 1 MG/ML IJ SOLN
INTRAMUSCULAR | Status: AC
  Filled 2014-02-27: qty 1

## 2014-02-27 MED ORDER — GLYCOPYRROLATE 0.2 MG/ML IJ SOLN
INTRAMUSCULAR | Status: AC
  Filled 2014-02-27: qty 3

## 2014-02-27 MED ORDER — ROPINIROLE HCL 1 MG PO TABS
4.0000 mg | ORAL_TABLET | Freq: Every day | ORAL | Status: DC
  Administered 2014-02-27: 4 mg via ORAL
  Filled 2014-02-27 (×2): qty 4

## 2014-02-27 MED ORDER — LABETALOL HCL 5 MG/ML IV SOLN
INTRAVENOUS | Status: AC
  Filled 2014-02-27: qty 4

## 2014-02-27 MED ORDER — FENTANYL CITRATE 0.05 MG/ML IJ SOLN
INTRAMUSCULAR | Status: AC
Start: 2014-02-27 — End: 2014-02-27
  Filled 2014-02-27: qty 5

## 2014-02-27 MED ORDER — NEOSTIGMINE METHYLSULFATE 10 MG/10ML IV SOLN
INTRAVENOUS | Status: AC
  Filled 2014-02-27: qty 1

## 2014-02-27 MED ORDER — AMLODIPINE BESYLATE 5 MG PO TABS
5.0000 mg | ORAL_TABLET | Freq: Every day | ORAL | Status: DC
  Administered 2014-02-28 – 2014-03-01 (×2): 5 mg via ORAL
  Filled 2014-02-27 (×2): qty 1

## 2014-02-27 MED ORDER — LIDOCAINE HCL 2 % IJ SOLN
INTRAMUSCULAR | Status: AC
  Filled 2014-02-27: qty 20

## 2014-02-27 MED ORDER — FENTANYL CITRATE 0.05 MG/ML IJ SOLN
INTRAMUSCULAR | Status: AC
  Filled 2014-02-27: qty 2

## 2014-02-27 MED ORDER — HEPARIN SODIUM (PORCINE) 5000 UNIT/ML IJ SOLN
5000.0000 [IU] | Freq: Once | INTRAMUSCULAR | Status: AC
  Administered 2014-02-27: 5000 [IU] via SUBCUTANEOUS
  Filled 2014-02-27: qty 1

## 2014-02-27 MED ORDER — MIDAZOLAM HCL 2 MG/2ML IJ SOLN
INTRAMUSCULAR | Status: AC
  Filled 2014-02-27: qty 2

## 2014-02-27 MED ORDER — BUPIVACAINE-EPINEPHRINE 0.25% -1:200000 IJ SOLN
INTRAMUSCULAR | Status: DC | PRN
  Administered 2014-02-27: 10 mL

## 2014-02-27 MED ORDER — PANTOPRAZOLE SODIUM 40 MG IV SOLR
40.0000 mg | Freq: Every day | INTRAVENOUS | Status: DC
  Administered 2014-02-27: 40 mg via INTRAVENOUS
  Filled 2014-02-27 (×2): qty 40

## 2014-02-27 MED ORDER — ACETAMINOPHEN 325 MG PO TABS: 650.0000 mg | ORAL_TABLET | Freq: Four times a day (QID) | ORAL | Status: DC | PRN

## 2014-02-27 MED ORDER — SODIUM CHLORIDE 0.9 % IV SOLN
INTRAVENOUS | Status: DC
  Administered 2014-02-27 – 2014-03-01 (×3): via INTRAVENOUS

## 2014-02-27 MED ORDER — NEOSTIGMINE METHYLSULFATE 10 MG/10ML IV SOLN
INTRAVENOUS | Status: DC | PRN
  Administered 2014-02-27: 5 mg via INTRAVENOUS

## 2014-02-27 MED ORDER — SUCCINYLCHOLINE CHLORIDE 20 MG/ML IJ SOLN
INTRAMUSCULAR | Status: DC | PRN
  Administered 2014-02-27: 100 mg via INTRAVENOUS

## 2014-02-27 MED ORDER — LISINOPRIL 40 MG PO TABS: 40.0000 mg | ORAL_TABLET | Freq: Every day | ORAL | Status: DC

## 2014-02-27 MED ORDER — AMLODIPINE BESY-BENAZEPRIL HCL 5-10 MG PO CAPS: 1.0000 | ORAL_CAPSULE | Freq: Every morning | ORAL | Status: DC

## 2014-02-27 MED ORDER — LABETALOL HCL 5 MG/ML IV SOLN
5.0000 mg | INTRAVENOUS | Status: DC | PRN
  Administered 2014-02-27: 5 mg via INTRAVENOUS

## 2014-02-27 MED ORDER — GLYCOPYRROLATE 0.2 MG/ML IJ SOLN
INTRAMUSCULAR | Status: DC | PRN
  Administered 2014-02-27: 0.6 mg via INTRAVENOUS
  Administered 2014-02-27 (×2): 0.1 mg via INTRAVENOUS

## 2014-02-27 MED ORDER — AMLODIPINE BESYLATE 5 MG PO TABS
5.0000 mg | ORAL_TABLET | Freq: Once | ORAL | Status: AC
  Administered 2014-02-27: 5 mg via ORAL
  Filled 2014-02-27: qty 1

## 2014-02-27 MED ORDER — FENTANYL CITRATE 0.05 MG/ML IJ SOLN
25.0000 ug | INTRAMUSCULAR | Status: DC | PRN
  Administered 2014-02-27: 25 ug via INTRAVENOUS

## 2014-02-27 MED ORDER — ONDANSETRON HCL 4 MG/2ML IJ SOLN: 4.0000 mg | Freq: Four times a day (QID) | INTRAMUSCULAR | Status: DC | PRN

## 2014-02-27 MED ORDER — LACTATED RINGERS IV SOLN
INTRAVENOUS | Status: DC
  Administered 2014-02-27: 12:00:00 via INTRAVENOUS
  Administered 2014-02-27: 1000 mL via INTRAVENOUS

## 2014-02-27 MED ORDER — ONDANSETRON HCL 4 MG/2ML IJ SOLN
INTRAMUSCULAR | Status: AC
  Filled 2014-02-27: qty 2

## 2014-02-27 MED ORDER — LIDOCAINE HCL 2 % EX GEL
CUTANEOUS | Status: AC
  Filled 2014-02-27: qty 10

## 2014-02-27 MED ORDER — ONDANSETRON HCL 4 MG/2ML IJ SOLN
INTRAMUSCULAR | Status: DC | PRN
  Administered 2014-02-27: 4 mg via INTRAVENOUS

## 2014-02-27 MED ORDER — PHENYLEPHRINE HCL 10 MG/ML IJ SOLN
INTRAMUSCULAR | Status: DC | PRN
  Administered 2014-02-27: 120 ug via INTRAVENOUS
  Administered 2014-02-27: 80 ug via INTRAVENOUS
  Administered 2014-02-27: 120 ug via INTRAVENOUS
  Administered 2014-02-27: 160 ug via INTRAVENOUS
  Administered 2014-02-27: 120 ug via INTRAVENOUS
  Administered 2014-02-27 (×2): 80 ug via INTRAVENOUS

## 2014-02-27 MED ORDER — CLONIDINE HCL 0.1 MG PO TABS
0.1000 mg | ORAL_TABLET | Freq: Two times a day (BID) | ORAL | Status: DC
  Administered 2014-02-27 – 2014-03-01 (×4): 0.1 mg via ORAL
  Filled 2014-02-27 (×5): qty 1

## 2014-02-27 MED ORDER — HYDROMORPHONE HCL 1 MG/ML IJ SOLN
0.2500 mg | INTRAMUSCULAR | Status: DC | PRN
  Administered 2014-02-27 (×4): 0.5 mg via INTRAVENOUS

## 2014-02-27 MED ORDER — ASPIRIN EC 81 MG PO TBEC
81.0000 mg | DELAYED_RELEASE_TABLET | Freq: Every morning | ORAL | Status: DC
  Administered 2014-02-28 – 2014-03-01 (×2): 81 mg via ORAL
  Filled 2014-02-27 (×2): qty 1

## 2014-02-27 MED ORDER — HYDROMORPHONE HCL 1 MG/ML IJ SOLN
1.0000 mg | INTRAMUSCULAR | Status: DC | PRN
  Administered 2014-02-27 – 2014-03-01 (×6): 1 mg via INTRAVENOUS
  Filled 2014-02-27 (×7): qty 1

## 2014-02-27 MED ORDER — CLONAZEPAM 1 MG PO TABS
1.0000 mg | ORAL_TABLET | Freq: Every evening | ORAL | Status: DC | PRN
  Administered 2014-02-27 – 2014-02-28 (×2): 1 mg via ORAL
  Filled 2014-02-27 (×2): qty 1

## 2014-02-27 MED ORDER — KETOROLAC TROMETHAMINE 15 MG/ML IJ SOLN: 15.0000 mg | Freq: Three times a day (TID) | INTRAMUSCULAR | Status: DC | PRN

## 2014-02-27 MED ORDER — ENOXAPARIN SODIUM 40 MG/0.4ML ~~LOC~~ SOLN
40.0000 mg | SUBCUTANEOUS | Status: DC
  Administered 2014-02-27 – 2014-02-28 (×2): 40 mg via SUBCUTANEOUS
  Filled 2014-02-27 (×3): qty 0.4

## 2014-02-27 MED ORDER — PROPOFOL 10 MG/ML IV BOLUS
INTRAVENOUS | Status: DC | PRN
  Administered 2014-02-27: 160 mg via INTRAVENOUS
  Administered 2014-02-27: 40 mg via INTRAVENOUS

## 2014-02-27 MED ORDER — MIDAZOLAM HCL 5 MG/5ML IJ SOLN
INTRAMUSCULAR | Status: DC | PRN
  Administered 2014-02-27 (×2): 0.5 mg via INTRAVENOUS
  Administered 2014-02-27: 1 mg via INTRAVENOUS

## 2014-02-27 MED ORDER — OXYCODONE-ACETAMINOPHEN 5-325 MG PO TABS
1.0000 | ORAL_TABLET | Freq: Four times a day (QID) | ORAL | Status: DC | PRN
  Administered 2014-02-27 – 2014-03-01 (×6): 2 via ORAL
  Filled 2014-02-27 (×6): qty 2

## 2014-02-27 MED ORDER — ACETAMINOPHEN 650 MG RE SUPP: 650.0000 mg | Freq: Four times a day (QID) | RECTAL | Status: DC | PRN

## 2014-02-27 MED ORDER — BUPIVACAINE-EPINEPHRINE (PF) 0.25% -1:200000 IJ SOLN
INTRAMUSCULAR | Status: AC
  Filled 2014-02-27: qty 30

## 2014-02-27 MED ORDER — CISATRACURIUM BESYLATE (PF) 10 MG/5ML IV SOLN
INTRAVENOUS | Status: DC | PRN
Start: 2014-02-27 — End: 2014-02-27
  Administered 2014-02-27: 2 mg via INTRAVENOUS
  Administered 2014-02-27: 6 mg via INTRAVENOUS

## 2014-02-27 SURGICAL SUPPLY — 46 items
BINDER ABDOMINAL 12 ML 46-62 (SOFTGOODS) ×3 IMPLANT
BLADE SURG SZ11 CARB STEEL (BLADE) ×3 IMPLANT
CABLE HIGH FREQUENCY MONO STRZ (ELECTRODE) IMPLANT
CANISTER SUCTION 2500CC (MISCELLANEOUS) IMPLANT
DECANTER SPIKE VIAL GLASS SM (MISCELLANEOUS) IMPLANT
DERMABOND ADVANCED (GAUZE/BANDAGES/DRESSINGS) ×1
DERMABOND ADVANCED .7 DNX12 (GAUZE/BANDAGES/DRESSINGS) ×2 IMPLANT
DEVICE RELIATACK FIXATION (MISCELLANEOUS) ×3 IMPLANT
DRAPE INCISE IOBAN 66X45 STRL (DRAPES) ×3 IMPLANT
DRAPE LAPAROSCOPIC ABDOMINAL (DRAPES) ×3 IMPLANT
DRSG TEGADERM 2-3/8X2-3/4 SM (GAUZE/BANDAGES/DRESSINGS) ×3 IMPLANT
ELECT REM PT RETURN 9FT ADLT (ELECTROSURGICAL) ×3
ELECTRODE REM PT RTRN 9FT ADLT (ELECTROSURGICAL) ×2 IMPLANT
GAUZE SPONGE 2X2 8PLY STRL LF (GAUZE/BANDAGES/DRESSINGS) ×2 IMPLANT
GLOVE BIO SURGEON STRL SZ7 (GLOVE) ×6 IMPLANT
GLOVE BIOGEL PI IND STRL 7.0 (GLOVE) ×2 IMPLANT
GLOVE BIOGEL PI IND STRL 7.5 (GLOVE) ×4 IMPLANT
GLOVE BIOGEL PI INDICATOR 7.0 (GLOVE) ×1
GLOVE BIOGEL PI INDICATOR 7.5 (GLOVE) ×2
GLOVE ECLIPSE 8.0 STRL XLNG CF (GLOVE) ×3 IMPLANT
GOWN STRL REUS W/ TWL XL LVL3 (GOWN DISPOSABLE) IMPLANT
GOWN STRL REUS W/TWL LRG LVL3 (GOWN DISPOSABLE) ×6 IMPLANT
GOWN STRL REUS W/TWL XL LVL3 (GOWN DISPOSABLE) ×3 IMPLANT
KIT BASIN OR (CUSTOM PROCEDURE TRAY) ×3 IMPLANT
LIQUID BAND (GAUZE/BANDAGES/DRESSINGS) ×3 IMPLANT
MARKER SKIN DUAL TIP RULER LAB (MISCELLANEOUS) ×3 IMPLANT
MESH VENTRALIGHT ST 6IN CRC (Mesh General) ×3 IMPLANT
NS IRRIG 1000ML POUR BTL (IV SOLUTION) ×3 IMPLANT
RELOAD RELIATACK 5 (MISCELLANEOUS) ×6 IMPLANT
SCISSORS LAP 5X35 DISP (ENDOMECHANICALS) ×3 IMPLANT
SET IRRIG TUBING LAPAROSCOPIC (IRRIGATION / IRRIGATOR) IMPLANT
SOLUTION ANTI FOG 6CC (MISCELLANEOUS) ×3 IMPLANT
SPONGE GAUZE 2X2 STER 10/PKG (GAUZE/BANDAGES/DRESSINGS) ×1
STAPLER VISISTAT 35W (STAPLE) ×3 IMPLANT
STRIP CLOSURE SKIN 1/2X4 (GAUZE/BANDAGES/DRESSINGS) ×3 IMPLANT
SUT MNCRL AB 4-0 PS2 18 (SUTURE) ×3 IMPLANT
SUT PROLENE 0 CT 1 CR/8 (SUTURE) ×3 IMPLANT
SUT PROLENE 2 0 SH DA (SUTURE) IMPLANT
TACKER 5MM HERNIA 3.5CML NAB (ENDOMECHANICALS) IMPLANT
TOWEL OR 17X26 10 PK STRL BLUE (TOWEL DISPOSABLE) ×3 IMPLANT
TRAY FOLEY CATH 14FRSI W/METER (CATHETERS) ×3 IMPLANT
TRAY LAPAROSCOPIC (CUSTOM PROCEDURE TRAY) ×3 IMPLANT
TROCAR BALLN 12MMX100 BLUNT (TROCAR) IMPLANT
TROCAR BLADELESS OPT 5 75 (ENDOMECHANICALS) ×9 IMPLANT
TROCAR XCEL NON-BLD 11X100MML (ENDOMECHANICALS) ×3 IMPLANT
TUBING INSUFFLATION 10FT LAP (TUBING) ×3 IMPLANT

## 2014-02-27 NOTE — Op Note (Signed)
Preoperative diagnosis: incarcerated umbilical hernia Postoperative diagnosis: same as above Procedure: Laparoscopic repair of an incarcerated umbilical hernia repair with mesh Surgeon: Dr. Serita Grammes Anesthesia: Gen. Estimated blood loss: Minimal Complications: None Drains: None Specimens: None Sponge count was correct at completion Disposition to recovery stable  Indications: This is a 48 year old male with multiple medical problems who has an enlarging umbilical hernia that is causing him some discomfort. This is incarcerated on his exam. We discussed a laparoscopic repair with mesh.  Procedure: After informed consent was obtained the patient was taken to the operating room. He was given 3 g of cefazolin. Sequential compression devices were on his legs. He was surgical anesthesia without complication. Foley catheter and orogastric tube were placed. He was then prepped and draped in the standard sterile surgical fashion. Surgical timeout was then performed.  I infiltrated marcaine in his left upper quadrant. I then entered into his abdomen with a 5 mm Optiview trocar and  this was done without any evidence of an entry injury. His abdomen was then insufflated 15 mmHg pressure. A fair amount of his omentum was incarcerated in his umbilicus. This was removed with blunt graspers and some pressure externally. Once I done this the hernia was about 3 cm in size. I use a 15 cm ventralight mesh. I placed 4 2-0 Prolene sutures in each of the cardinal positions around the mesh. I then inserted this. I then brought the sutures up into position using the Endo Close device. I then used the ReliaTack and secured the remaining edges of the mesh to the abdominal wall. I did place 2 more Prolene sutures on the left side to secure the mesh. This was in good position. It gave me good overlap. There was no evidence of an entry injury after I inspected it again. I then removed my 11 mm trocar. I then used a 0 Vicryl  suture and closed using Endo Close device. I then desufflated the abdomen. These were closed with 4 Monocryl and glue. Glue was used to close all my other small holes. Abdominal binder was placed. He  was extubated and transferred to recovery stable.

## 2014-02-27 NOTE — Interval H&P Note (Signed)
History and Physical Interval Note:  02/27/2014 11:18 AM  Justin Hall  has presented today for surgery, with the diagnosis of umbilical hernia  The various methods of treatment have been discussed with the patient and family. After consideration of risks, benefits and other options for treatment, the patient has consented to  Procedure(s): [place] (N/A) INSERTION OF MESH (N/A) as a surgical intervention .  The patient's history has been reviewed, patient examined, no change in status, stable for surgery.  I have reviewed the patient's chart and labs.  Questions were answered to the patient's satisfaction.     Carlene Bickley

## 2014-02-27 NOTE — Anesthesia Procedure Notes (Addendum)
Procedure Name: Intubation Date/Time: 02/27/2014 11:36 AM Performed by: Donne Hazel, MATTHEW Pre-anesthesia Checklist: Patient identified, Emergency Drugs available, Suction available and Patient being monitored Patient Re-evaluated:Patient Re-evaluated prior to inductionOxygen Delivery Method: Circle system utilized and Simple face mask Preoxygenation: Pre-oxygenation with 100% oxygen Intubation Type: IV induction Ventilation: Two handed mask ventilation required and Oral airway inserted - appropriate to patient size Laryngoscope Size: Mac and 4 Grade View: Grade I Tube type: Oral Tube size: 8.0 mm Number of attempts: 1 Airway Equipment and Method: Patient positioned with wedge pillow,  Video-laryngoscopy,  Oral airway and LTA kit utilized Placement Confirmation: ETT inserted through vocal cords under direct vision,  positive ETCO2,  CO2 detector and breath sounds checked- equal and bilateral Secured at: 22 cm Tube secured with: Tape Dental Injury: Teeth and Oropharynx as per pre-operative assessment  Difficulty Due To: Difficulty was anticipated and Difficult Airway- due to limited oral opening Comments: Dr. Eliseo Squires sprayed pt's mouth with cetacaine in holding area, and patient given lidocaine nebulizer in holding. While patient awake, but sedated, Dr. Eliseo Squires  Used glidescope with mac 4 blade to visualize cords. Cords easily visualized. glide scope removed from patient's mouth. Pt given intubating dose of propofol, pt easily masked with 2 hands on mask, pt given succinylcholine and then Dr. Eliseo Squires DVL with glidescope mac 4 blade, 8.0 ett atraumatically passed through cords, +bil breath sounds, + etco2.

## 2014-02-27 NOTE — Anesthesia Preprocedure Evaluation (Addendum)
Anesthesia Evaluation  Patient identified by MRN, date of birth, ID band Patient awake    Reviewed: Allergy & Precautions, H&P , NPO status , Patient's Chart, lab work & pertinent test results  History of Anesthesia Complications (+) history of anesthetic complications  Airway Mallampati: II  TM Distance: >3 FB Neck ROM: Full    Dental no notable dental hx.    Pulmonary sleep apnea ,  breath sounds clear to auscultation  Pulmonary exam normal       Cardiovascular hypertension, Pt. on medications Rhythm:Regular Rate:Normal     Neuro/Psych negative neurological ROS  negative psych ROS   GI/Hepatic negative GI ROS, Neg liver ROS,   Endo/Other  diabetes, Type 2, Oral Hypoglycemic AgentsMorbid obesity  Renal/GU negative Renal ROS  negative genitourinary   Musculoskeletal  (+) Arthritis -,   Abdominal (+) + obese,   Peds negative pediatric ROS (+)  Hematology negative hematology ROS (+)   Anesthesia Other Findings   Reproductive/Obstetrics negative OB ROS                            Anesthesia Physical Anesthesia Plan  ASA: III  Anesthesia Plan: General   Post-op Pain Management:    Induction: Intravenous  Airway Management Planned: Oral ETT  Additional Equipment:   Intra-op Plan:   Post-operative Plan: Extubation in OR  Informed Consent: I have reviewed the patients History and Physical, chart, labs and discussed the procedure including the risks, benefits and alternatives for the proposed anesthesia with the patient or authorized representative who has indicated his/her understanding and acceptance.   Dental advisory given  Plan Discussed with: CRNA  Anesthesia Plan Comments: (Glidescope available.)       Anesthesia Quick Evaluation

## 2014-02-27 NOTE — Transfer of Care (Signed)
Immediate Anesthesia Transfer of Care Note  Patient: Justin Hall  Procedure(s) Performed: Procedure(s): LAPAROSCOPIC UMBILICAL HERNIA REPAIR WITH MESH (N/A)  Patient Location: PACU  Anesthesia Type:General  Level of Consciousness: Patient easily awoken, sedated, comfortable, cooperative, following commands, responds to stimulation.   Airway & Oxygen Therapy: Patient spontaneously breathing, ventilating well, oxygen via simple oxygen mask.  Post-op Assessment: Report given to PACU RN, vital signs reviewed and stable, moving all extremities.   Post vital signs: Reviewed and stable.  Complications: No apparent anesthesia complications

## 2014-02-27 NOTE — Anesthesia Postprocedure Evaluation (Addendum)
  Anesthesia Post-op Note  Patient: Justin Hall  Procedure(s) Performed: Procedure(s) (LRB): LAPAROSCOPIC UMBILICAL HERNIA REPAIR WITH MESH (N/A)  Patient Location: PACU  Anesthesia Type: General  Level of Consciousness: awake and alert   Airway and Oxygen Therapy: Patient Spontanous Breathing  Post-op Pain: mild  Post-op Assessment: Post-op Vital signs reviewed, Patient's Cardiovascular Status Stable, Respiratory Function Stable, Patent Airway and No signs of Nausea or vomiting  Last Vitals:  Filed Vitals:   02/27/14 1430  BP: 150/91  Pulse: 83  Temp:   Resp: 12    Post-op Vital Signs: stable   Complications: No apparent anesthesia complications. Sleep apnea precautions and orders written.

## 2014-02-27 NOTE — H&P (Signed)
The patient is a 48 year old male who presents with an umbilical hernia. 74 yom with history of htn, dm, osa on cpap who has long history of umbilical hernia that has gotten much bigger over last couple week. This is more symptomatic. He has no n/v or changes in bms. He also has some blood at the skin at this site also. recently seen his pcp with changes in his medications for his bp   Other Problems Lenoria Farrier; 02/10/2014 9:24 AM) Anxiety Disorder Arthritis Diabetes Mellitus High blood pressure Hypercholesterolemia Sleep Apnea  Past Surgical History Lenoria Farrier; 02/10/2014 9:24 AM) Knee Surgery Left. Shoulder Surgery Left.  Diagnostic Studies History Lenoria Farrier; 02/10/2014 9:24 AM) Colonoscopy never  Allergies Apolonio Schneiders [place]; 02/10/2014 9:24 AM) No Known Drug Allergies10/23/2015  Medication History Apolonio Schneiders [place]; 02/10/2014 9:26 AM) ROPINIRole HCl (4MG  Tablet, Oral) Active. CloNIDine HCl (0.2MG  Tablet, Oral) Active. Oxycodone-Acetaminophen (5-325MG  Tablet, Oral as needed) Active. ClonazePAM (1MG  Tablet, Oral as needed) Active. PredniSONE (20MG  Tablet, Oral as needed) Active. Allopurinol (300MG  Tablet, Oral) Active. Aleve (220MG  Capsule, Oral) Active. MetFORMIN HCl ER (OSM) (500MG  Tablet ER 24HR, Oral) Active.  Social History Lenoria Farrier; 02/10/2014 9:24 AM) Alcohol use Occasional alcohol use. Caffeine use Coffee. No drug use Tobacco use Never smoker.  Family History Lenoria Farrier; 02/10/2014 9:24 AM) Diabetes Mellitus Father. Hypertension Father. Respiratory Condition Mother.  Review of Systems Apolonio Schneiders [place]; 02/10/2014 9:24 AM) General Present- Fatigue and Weight Gain. Not Present- Appetite Loss, Chills, Fever, Night Sweats and Weight Loss. Skin Not Present- Change in Wart/Mole, Dryness, Hives, Jaundice, New Lesions, Non-Healing Wounds, Rash and Ulcer. HEENT Not Present- Earache, Hearing Loss, Hoarseness,  Nose Bleed, Oral Ulcers, Ringing in the Ears, Seasonal Allergies, Sinus Pain, Sore Throat, Visual Disturbances, Wears glasses/contact lenses and Yellow Eyes. Respiratory Present- Snoring. Not Present- Bloody sputum, Chronic Cough, Difficulty Breathing and Wheezing. Breast Not Present- Breast Mass, Breast Pain, Nipple Discharge and Skin Changes. Cardiovascular Present- Swelling of Extremities. Not Present- Chest Pain, Difficulty Breathing Lying Down, Leg Cramps, Palpitations, Rapid Heart Rate and Shortness of Breath. Gastrointestinal Present- Bloating. Not Present- Abdominal Pain, Bloody Stool, Change in Bowel Habits, Chronic diarrhea, Constipation, Difficulty Swallowing, Excessive gas, Gets full quickly at meals, Hemorrhoids, Indigestion, Nausea, Rectal Pain and Vomiting. Male Genitourinary Present- Impotence and Nocturia. Not Present- Blood in Urine, Change in Urinary Stream, Frequency, Painful Urination, Urgency and Urine Leakage. Musculoskeletal Present- Joint Pain, Joint Stiffness, Muscle Pain and Swelling of Extremities. Not Present- Back Pain and Muscle Weakness. Neurological Present- Numbness and Tingling. Not Present- Decreased Memory, Fainting, Headaches, Seizures, Tremor, Trouble walking and Weakness. Psychiatric Present- Anxiety and Change in Sleep Pattern. Not Present- Bipolar, Depression, Fearful and Frequent crying. Endocrine Present- New Diabetes. Not Present- Cold Intolerance, Excessive Hunger, Hair Changes, Heat Intolerance and Hot flashes. Hematology Not Present- Easy Bruising, Excessive bleeding, Gland problems, HIV and Persistent Infections.   Vitals Apolonio Schneiders [place]; 02/10/2014 9:27 AM) 02/10/2014 9:26 AM Weight: 323.13 lb Height: 69in Body Surface Area: 2.67 m Body Mass Index: 47.72 kg/m Temp.: 98.65F(Tympanic)  Pulse: 86 (Regular)  Resp.: 18 (Unlabored)  BP: 132/84 (Sitting, Left Arm, Standard)    Physical Exam Rolm Bookbinder MD; 02/10/2014 10:06  AM) General Mental Status-Alert. Orientation-Oriented X3.  Chest and Lung Exam Chest and lung exam reveals -normal excursion with symmetric chest walls, quiet, even and easy respiratory effort with no use of accessory muscles and on auscultation, normal breath sounds, no adventitious sounds and normal vocal resonance.  Cardiovascular Cardiovascular examination reveals -normal heart sounds, regular rate  and rhythm with no murmurs.  Abdomen Note: nonreducible umbilical hernia, nontender, some skin irritation from skin touching but no breakthrough from hernia     Assessment & Plan Rolm Bookbinder MD; 34/14/4360 1:65 PM) UMBILICAL HERNIA (800.6  K42.9) Impression: I recommended a laparoscopic repair with mesh due to his habitus. I think the defect is too large from primary repair and with the skin irritation due to the folds I think this would be safest. He is also interested in bariatric surgery but I think this should be done first and he agrees. discussed surgery and postoperative recovery. Risks include but not limited to bleeding infection recurrence injury to bowel requiring repair, ileus, dvt/pe, arrythmia. He understands and we will procee soon

## 2014-02-28 ENCOUNTER — Encounter (HOSPITAL_COMMUNITY): Payer: Self-pay | Admitting: General Surgery

## 2014-02-28 DIAGNOSIS — K429 Umbilical hernia without obstruction or gangrene: Secondary | ICD-10-CM | POA: Diagnosis not present

## 2014-02-28 LAB — BASIC METABOLIC PANEL
Anion gap: 14 (ref 5–15)
BUN: 21 mg/dL (ref 6–23)
CO2: 26 mEq/L (ref 19–32)
Calcium: 9.1 mg/dL (ref 8.4–10.5)
Chloride: 98 mEq/L (ref 96–112)
Creatinine, Ser: 1.11 mg/dL (ref 0.50–1.35)
GFR calc Af Amer: 88 mL/min — ABNORMAL LOW (ref >90)
GFR calc non Af Amer: 76 mL/min — ABNORMAL LOW (ref >90)
Glucose, Bld: 207 mg/dL — ABNORMAL HIGH (ref 70–99)
Potassium: 4.5 mEq/L (ref 3.7–5.3)
Sodium: 138 mEq/L (ref 137–147)

## 2014-02-28 LAB — GLUCOSE, CAPILLARY
Glucose-Capillary: 201 mg/dL — ABNORMAL HIGH (ref 70–99)
Glucose-Capillary: 165 mg/dL — ABNORMAL HIGH (ref 70–99)
Glucose-Capillary: 168 mg/dL — ABNORMAL HIGH (ref 70–99)
Glucose-Capillary: 188 mg/dL — ABNORMAL HIGH (ref 70–99)
Glucose-Capillary: 202 mg/dL — ABNORMAL HIGH (ref 70–99)

## 2014-02-28 MED ORDER — INSULIN ASPART 100 UNIT/ML ~~LOC~~ SOLN
0.0000 [IU] | Freq: Three times a day (TID) | SUBCUTANEOUS | Status: DC
  Administered 2014-02-28: 3 [IU] via SUBCUTANEOUS
  Administered 2014-02-28: 5 [IU] via SUBCUTANEOUS
  Administered 2014-02-28 – 2014-03-01 (×2): 3 [IU] via SUBCUTANEOUS

## 2014-02-28 MED ORDER — PANTOPRAZOLE SODIUM 40 MG IV SOLR: 40.0000 mg | Freq: Every day | INTRAVENOUS | Status: DC

## 2014-02-28 MED ORDER — ROPINIROLE HCL 1 MG PO TABS
4.0000 mg | ORAL_TABLET | Freq: Two times a day (BID) | ORAL | Status: DC
  Administered 2014-02-28 – 2014-03-01 (×3): 4 mg via ORAL
  Filled 2014-02-28 (×4): qty 4

## 2014-02-28 MED ORDER — PANTOPRAZOLE SODIUM 40 MG PO TBEC
40.0000 mg | DELAYED_RELEASE_TABLET | Freq: Every day | ORAL | Status: DC
  Administered 2014-02-28: 40 mg via ORAL
  Filled 2014-02-28 (×2): qty 1

## 2014-02-28 MED ORDER — INSULIN ASPART 100 UNIT/ML ~~LOC~~ SOLN
0.0000 [IU] | Freq: Every day | SUBCUTANEOUS | Status: DC
  Administered 2014-02-28: 2 [IU] via SUBCUTANEOUS

## 2014-02-28 NOTE — Plan of Care (Signed)
Problem: Phase III Progression Outcomes Goal: Pain controlled on oral analgesia Outcome: Progressing Goal: Activity at appropriate level-compared to baseline (UP IN CHAIR FOR HEMODIALYSIS)  Outcome: Progressing Goal: Voiding independently Outcome: Completed/Met Date Met:  02/28/14

## 2014-02-28 NOTE — Plan of Care (Signed)
Problem: Phase I Progression Outcomes Goal: OOB as tolerated unless otherwise ordered Outcome: Progressing     

## 2014-02-28 NOTE — Progress Notes (Signed)
UR completed 

## 2014-02-28 NOTE — Plan of Care (Signed)
Problem: Phase I Progression Outcomes Goal: Tubes/drains patent Outcome: Not Applicable Date Met:  02/28/14     

## 2014-02-28 NOTE — Progress Notes (Signed)
The patient is receiving Protonix by the intravenous route.  Based on criteria approved by the Pharmacy and [place], the medication is being converted to the equivalent oral dose form.  These criteria include: -No Active GI bleeding -Able to tolerate diet of full liquids (or better) or tube feeding -Able to tolerate other medications by the oral or enteral route  If you have any questions about this conversion, please contact the Pharmacy Department (phone [phone]).  Thank you. The patient is receiving Protonix by the intravenous route.  Based on criteria approved by the Pharmacy and [place], the medication is being converted to the equivalent oral dose form.  Minda Ditto PharmD Pager [phone] 02/28/2014, 8:39 AM

## 2014-02-28 NOTE — Plan of Care (Signed)
Problem: Phase I Progression Outcomes Goal: Pain controlled with appropriate interventions Outcome: Completed/Met Date Met:  02/28/14

## 2014-02-28 NOTE — Plan of Care (Signed)
Problem: Phase I Progression Outcomes Goal: Voiding-avoid urinary catheter unless indicated Outcome: Completed/Met Date Met:  02/28/14     

## 2014-02-28 NOTE — Progress Notes (Signed)
1 Day Post-Op  Subjective: Pain at surgical site, passing flatus, tol diet, ambulating in room  Objective: Vital signs in last 24 hours: Temp:  [97.6 F (36.4 C)-98.6 F (37 C)] 98.6 F (37 C) (11/10 0542) Pulse Rate:  [77-94] 84 (11/10 0542) Resp:  [10-21] 16 (11/10 0542) BP: (148-173)/(76-124) 148/91 mmHg (11/10 0542) SpO2:  [93 %-100 %] 95 % (11/10 0542) Weight:  [332 lb (150.594 kg)] 332 lb (150.594 kg) (11/09 0812) Last BM Date: 02/26/14  Intake/Output from previous day: 11/09 0701 - 11/10 0700 In: 3473.3 [P.O.:480; I.V.:2993.3] Out: 1650 [Urine:1650] Intake/Output this shift:    General appearance: no distress Resp: clear to auscultation bilaterally Cardio: regular rate and rhythm GI: incisions clean, bs present approp tender  Lab Results:  No results for input(s): WBC, HGB, HCT, PLT in the last 72 hours. BMET  Recent Labs  02/28/14 0433  NA 138  K 4.5  CL 98  CO2 26  GLUCOSE 207*  BUN 21  CREATININE 1.11  CALCIUM 9.1    Anti-infectives: Anti-infectives    Start     Dose/Rate Route Frequency Ordered Stop   02/27/14 1600  ceFAZolin (ANCEF) IVPB 2 g/50 mL premix     2 g100 mL/hr over 30 Minutes Intravenous 3 times per day 02/27/14 1458 02/27/14 2142   02/27/14 1000  ceFAZolin (ANCEF) 3 g in dextrose 5 % 50 mL IVPB     3 g160 mL/hr over 30 Minutes Intravenous  Once 02/27/14 0934 02/27/14 1141   02/26/14 1120  ceFAZolin (ANCEF) 3 g in dextrose 5 % 50 mL IVPB  Status:  Discontinued     3 g160 mL/hr over 30 Minutes Intravenous On call to O.R. 02/26/14 1120 02/27/14 1458      Assessment/Plan: POD 1 lap umbo hernia repair  Cont current pain control, likely here another day for pain control which is not surprising, he is still needing iv pain meds and not ready for dc today pulm toilet, oob today more Advance diet , decrease iv fluids Plan dc home tomorrow [facility] 02/28/2014

## 2014-02-28 NOTE — Plan of Care (Signed)
Problem: Phase I Progression Outcomes Goal: Incision/dressings dry and intact Outcome: Completed/Met Date Met:  02/28/14

## 2014-02-28 NOTE — Plan of Care (Signed)
Problem: Phase I Progression Outcomes Goal: Vital signs/hemodynamically stable Outcome: Completed/Met Date Met:  02/28/14

## 2014-02-28 NOTE — Plan of Care (Signed)
Problem: Phase I Progression Outcomes Goal: Initial discharge plan identified Outcome: Completed/Met Date Met:  02/28/14

## 2014-03-01 DIAGNOSIS — K429 Umbilical hernia without obstruction or gangrene: Secondary | ICD-10-CM | POA: Diagnosis not present

## 2014-03-01 LAB — GLUCOSE, CAPILLARY: Glucose-Capillary: 188 mg/dL — ABNORMAL HIGH (ref 70–99)

## 2014-03-01 MED ORDER — TRAMADOL HCL 50 MG PO TABS: 50.0000 mg | ORAL_TABLET | Freq: Four times a day (QID) | ORAL | Status: DC | PRN

## 2014-03-01 MED ORDER — OXYCODONE-ACETAMINOPHEN 10-325 MG PO TABS: 1.0000 | ORAL_TABLET | Freq: Four times a day (QID) | ORAL | Status: DC | PRN

## 2014-03-01 NOTE — Discharge Instructions (Signed)
[facility] LAPAROSCOPIC SURGERY: POST OP INSTRUCTIONS  Always review your discharge instruction sheet given to you by the facility where your surgery was performed. IF YOU HAVE DISABILITY OR FAMILY LEAVE FORMS, YOU MUST BRING THEM TO THE OFFICE FOR PROCESSING.   DO NOT GIVE THEM TO YOUR DOCTOR.  1. A prescription for pain medication may be given to you upon discharge.  Take your pain medication as prescribed, if needed.  If narcotic pain medicine is not needed, then you may take acetaminophen (Tylenol), naprosyn (Alleve), or ibuprofen (Advil) as needed. 2. Take your usually prescribed medications unless otherwise directed. 3. If you need a refill on your pain medication, please contact your pharmacy.  They will contact our office to request authorization. Prescriptions will not be filled after 5pm or on week-ends. 4. You should follow a light diet the first few days after arrival home, such as soup and crackers, etc.  Be sure to include lots of fluids daily. 5. Most patients will experience some swelling and bruising in the area of the incisions.  Ice packs will help.  Swelling and bruising can take several days to resolve.  6. It is common to experience some constipation if taking pain medication after surgery.  Increasing fluid intake and taking a stool softener (such as Colace) will usually help or prevent this problem from occurring.  A mild laxative (Milk of Magnesia or Miralax) should be taken according to package instructions if there are no bowel movements after 48 hours. 7. Unless discharge instructions indicate otherwise, you may remove your bandages 48 hours after surgery, and you may shower at that time.  You may have steri-strips (small skin tapes) in place directly over the incision.  These strips should be left on the skin for 7-10 days.  If your surgeon used skin glue on the incision, you may shower in 24 hours.  The glue will flake  off over the next 2-3 weeks.  Any sutures or staples will be removed at the office during your follow-up visit. 8. ACTIVITIES:  You may resume regular (light) daily activities beginning the next day--such as daily self-care, walking, climbing stairs--gradually increasing activities as tolerated.  You may have sexual intercourse when it is comfortable.  Refrain from any heavy lifting or straining until approved by your doctor. a. You may drive when you are no longer taking prescription pain medication, you can comfortably wear a seatbelt, and you can safely maneuver your car and apply brakes. b. RETURN TO WORK:  __________________________________________________________ 9. You should see your doctor in the office for a follow-up appointment approximately 2-3 weeks after your surgery.  Make sure that you call for this appointment within a day or two after you arrive home to insure a convenient appointment time. 10. OTHER INSTRUCTIONS: __________________________________________________________________________________________________________________________ __________________________________________________________________________________________________________________________ WHEN TO CALL YOUR DOCTOR: 1. Fever over 101.0 2. Inability to urinate 3. Continued bleeding from incision. 4. Increased pain, redness, or drainage from the incision. 5. Increasing abdominal pain  The clinic staff is available to answer your questions during regular business hours.  Please don't hesitate to call and ask to speak to one of the nurses for clinical concerns.  If you have a medical emergency, go to the nearest emergency room or call 911.  A surgeon from [facility] is always on call at the hospital. [address], [address], [place], [place]  [address] ? [address] [address], [place], [place]   [address] [phone] ? [phone] ? [phone]   [phone] Web site: [email or web address]  

## 2014-03-01 NOTE — Discharge Summary (Signed)
Physician Discharge Summary  Patient ID: Justin Hall MRN: [id number] DOB/AGE: 21-Jul-1965 48 y.o.  Admit date: 02/27/2014 Discharge date: 03/01/2014  Admission Diagnoses: Umbilical hernia Diabetes  HTN OSA  Discharge Diagnoses:  Active Problems:   S/P laparoscopic hernia repair   Discharged Condition: good  Hospital Course: 84 yom with multiple cormorbidities who has symptomatic umbilical hernia. I did a lap umbilical hernia repair with mesh. He has done well.  Stayed overnight an extra night for pain control He is passing flatus now, tolerating diet, ambulating and pain controlled.  Consults: None  Significant Diagnostic Studies: none  Treatments: surgery: lap umbilical hernia repair with mesh   Disposition: 01-Home or Self Care     Medication List    STOP taking these medications        naproxen sodium 220 MG tablet  Commonly known as:  ANAPROX      TAKE these medications        allopurinol 300 MG tablet  Commonly known as:  ZYLOPRIM  Take 300 mg by mouth every morning.     amLODipine-benazepril 5-10 MG per capsule  Commonly known as:  LOTREL  Take 1 capsule by mouth every morning.     aspirin EC 81 MG tablet  Take 81 mg by mouth every morning.     clonazePAM 1 MG tablet  Commonly known as:  KLONOPIN  Take 1 mg by mouth at bedtime as needed for anxiety (sleep).     cloNIDine 0.1 MG tablet  Commonly known as:  CATAPRES  Take 0.1 mg by mouth 2 (two) times daily.     metFORMIN 500 MG 24 hr tablet  Commonly known as:  GLUCOPHAGE-XR  Take 500 mg by mouth daily with breakfast.     oxyCODONE-acetaminophen 5-325 MG per tablet  Commonly known as:  PERCOCET/ROXICET  Take 1-2 tablets by mouth every 6 (six) hours as needed for severe pain (for pain).     oxyCODONE-acetaminophen 10-325 MG per tablet  Commonly known as:  PERCOCET  Take 1 tablet by mouth every 6 (six) hours as needed for pain.     oxymetazoline 0.05 % nasal spray  Commonly known as:  AFRIN   Place 1 spray into both nostrils once.     predniSONE 20 MG tablet  Commonly known as:  DELTASONE  3 tabs po day one, then 2 tabs daily x 4 days     rOPINIRole 4 MG tablet  Commonly known as:  REQUIP  Take 4 mg by mouth at bedtime.     traMADol 50 MG tablet  Commonly known as:  ULTRAM  Take 1-2 tablets (50-100 mg total) by mouth every 6 (six) hours as needed.           Follow-up Information    Follow up with [facility], [place].   Specialty:  General Surgery   Contact information:   [address] [place] [address] [phone]       Signed: Rolm Bookbinder 03/01/2014, 11:03 PM

## 2014-03-01 NOTE — Progress Notes (Signed)
2 Days Post-Op  Subjective: Pain controlled, having flatus, tol diet, ambulating  Objective: Vital signs in last 24 hours: Temp:  [97.4 F (36.3 C)-98.9 F (37.2 C)] 97.4 F (36.3 C) (11/11 0542) Pulse Rate:  [75-88] 82 (11/11 0542) Resp:  [16-85] 82 (11/11 0542) BP: (123-153)/(63-94) 153/94 mmHg (11/11 0542) SpO2:  [92 %-97 %] 95 % (11/11 0542) Last BM Date: 02/26/14  Intake/Output from previous day: 11/10 0701 - 11/11 0700 In: 1440 [P.O.:840; I.V.:600] Out: 2300 [Urine:2300] Intake/Output this shift:    abd approp tender   Lab Results:  No results for input(s): WBC, HGB, HCT, PLT in the last 72 hours. BMET  Recent Labs  02/28/14 0433  NA 138  K 4.5  CL 98  CO2 26  GLUCOSE 207*  BUN 21  CREATININE 1.11  CALCIUM 9.1   PT/INR No results for input(s): LABPROT, INR in the last 72 hours. ABG No results for input(s): PHART, HCO3 in the last 72 hours.  Invalid input(s): PCO2, PO2  Studies/Results: No results found.  Anti-infectives: Anti-infectives    Start     Dose/Rate Route Frequency Ordered Stop   02/27/14 1600  ceFAZolin (ANCEF) IVPB 2 g/50 mL premix     2 g100 mL/hr over 30 Minutes Intravenous 3 times per day 02/27/14 1458 02/27/14 2142   02/27/14 1000  ceFAZolin (ANCEF) 3 g in dextrose 5 % 50 mL IVPB     3 g160 mL/hr over 30 Minutes Intravenous  Once 02/27/14 0934 02/27/14 1141   02/26/14 1120  ceFAZolin (ANCEF) 3 g in dextrose 5 % 50 mL IVPB  Status:  Discontinued     3 g160 mL/hr over 30 Minutes Intravenous On call to O.R. 02/26/14 1120 02/27/14 1458      Assessment/Plan: POD 2 lap umbo hernia 1. Dc home today on all home meds  [facility] 03/01/2014

## 2014-03-31 ENCOUNTER — Encounter (HOSPITAL_BASED_OUTPATIENT_CLINIC_OR_DEPARTMENT_OTHER): Payer: Self-pay

## 2014-03-31 ENCOUNTER — Emergency Department (HOSPITAL_BASED_OUTPATIENT_CLINIC_OR_DEPARTMENT_OTHER)
Admission: EM | Admit: 2014-03-31 | Discharge: 2014-03-31 | Disposition: A | Discharge status: Home / Self Care | Payer: Managed Care, Other (non HMO) | Attending: Emergency Medicine | Admitting: Emergency Medicine

## 2014-03-31 DIAGNOSIS — E119 Type 2 diabetes mellitus without complications: Secondary | ICD-10-CM | POA: Diagnosis not present

## 2014-03-31 DIAGNOSIS — M1A0310 Idiopathic chronic gout, right wrist, without tophus (tophi): Secondary | ICD-10-CM | POA: Diagnosis not present

## 2014-03-31 DIAGNOSIS — M1A0320 Idiopathic chronic gout, left wrist, without tophus (tophi): Secondary | ICD-10-CM | POA: Diagnosis not present

## 2014-03-31 DIAGNOSIS — M1A0210 Idiopathic chronic gout, right elbow, without tophus (tophi): Secondary | ICD-10-CM | POA: Insufficient documentation

## 2014-03-31 DIAGNOSIS — M1A0610 Idiopathic chronic gout, right knee, without tophus (tophi): Secondary | ICD-10-CM | POA: Insufficient documentation

## 2014-03-31 DIAGNOSIS — G2581 Restless legs syndrome: Secondary | ICD-10-CM | POA: Insufficient documentation

## 2014-03-31 DIAGNOSIS — Z79899 Other long term (current) drug therapy: Secondary | ICD-10-CM | POA: Diagnosis not present

## 2014-03-31 DIAGNOSIS — I10 Essential (primary) hypertension: Secondary | ICD-10-CM | POA: Diagnosis not present

## 2014-03-31 DIAGNOSIS — M1990 Unspecified osteoarthritis, unspecified site: Secondary | ICD-10-CM | POA: Diagnosis not present

## 2014-03-31 DIAGNOSIS — Z7982 Long term (current) use of aspirin: Secondary | ICD-10-CM | POA: Insufficient documentation

## 2014-03-31 DIAGNOSIS — Z7952 Long term (current) use of systemic steroids: Secondary | ICD-10-CM | POA: Insufficient documentation

## 2014-03-31 DIAGNOSIS — Z8781 Personal history of (healed) traumatic fracture: Secondary | ICD-10-CM | POA: Insufficient documentation

## 2014-03-31 DIAGNOSIS — M109 Gout, unspecified: Secondary | ICD-10-CM | POA: Diagnosis present

## 2014-03-31 DIAGNOSIS — M1000 Idiopathic gout, unspecified site: Secondary | ICD-10-CM

## 2014-03-31 LAB — CBG MONITORING, ED: Glucose-Capillary: 200 mg/dL — ABNORMAL HIGH (ref 70–99)

## 2014-03-31 MED ORDER — METHYLPREDNISOLONE SODIUM SUCC 125 MG IJ SOLR
80.0000 mg | Freq: Once | INTRAMUSCULAR | Status: AC
  Administered 2014-03-31: 80 mg via INTRAMUSCULAR

## 2014-03-31 MED ORDER — METHYLPREDNISOLONE SODIUM SUCC 125 MG IJ SOLR
80.0000 mg | Freq: Once | INTRAMUSCULAR | Status: DC
  Filled 2014-03-31: qty 2

## 2014-03-31 MED ORDER — OXYCODONE-ACETAMINOPHEN 5-325 MG PO TABS: 2.0000 | ORAL_TABLET | ORAL | Status: DC | PRN

## 2014-03-31 MED ORDER — METHYLPREDNISOLONE (PAK) 4 MG PO TABS: ORAL_TABLET | ORAL | Status: DC

## 2014-03-31 MED ORDER — OXYCODONE-ACETAMINOPHEN 5-325 MG PO TABS
1.0000 | ORAL_TABLET | Freq: Once | ORAL | Status: AC
  Administered 2014-03-31: 1 via ORAL
  Filled 2014-03-31: qty 1

## 2014-03-31 NOTE — ED Notes (Signed)
C/o gout to right knee and elbow x 2 hours
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
## 2014-03-31 NOTE — Discharge Instructions (Signed)
Gout Monitor your blood sugars when taking the steroids. Follow up with your doctor. Return to the ED if you develop or worsening symptoms. Gout is an inflammatory arthritis caused by a buildup of uric acid crystals in the joints. Uric acid is a chemical that is normally present in the blood. When the level of uric acid in the blood is too high it can form crystals that deposit in your joints and tissues. This causes joint redness, soreness, and swelling (inflammation). Repeat attacks are common. Over time, uric acid crystals can form into masses (tophi) near a joint, destroying bone and causing disfigurement. Gout is treatable and often preventable. CAUSES  The disease begins with elevated levels of uric acid in the blood. Uric acid is produced by your body when it breaks down a naturally found substance called purines. Certain foods you eat, such as meats and fish, contain high amounts of purines. Causes of an elevated uric acid level include:  Being passed down from parent to child (heredity).  Diseases that cause increased uric acid production (such as obesity, psoriasis, and certain cancers).  Excessive alcohol use.  Diet, especially diets rich in meat and seafood.  Medicines, including certain cancer-fighting medicines (chemotherapy), water pills (diuretics), and aspirin.  Chronic kidney disease. The kidneys are no longer able to remove uric acid well.  Problems with metabolism. Conditions strongly associated with gout include:  Obesity.  High blood pressure.  High cholesterol.  Diabetes. Not everyone with elevated uric acid levels gets gout. It is not understood why some people get gout and others do not. Surgery, joint injury, and eating too much of certain foods are some of the factors that can lead to gout attacks. SYMPTOMS   An attack of gout comes on quickly. It causes intense pain with redness, swelling, and warmth in a joint.  Fever can occur.  Often, only one joint  is involved. Certain joints are more commonly involved:  Base of the big toe.  Knee.  Ankle.  Wrist.  Finger. Without treatment, an attack usually goes away in a few days to weeks. Between attacks, you usually will not have symptoms, which is different from many other forms of arthritis. DIAGNOSIS  Your caregiver will suspect gout based on your symptoms and exam. In some cases, tests may be recommended. The tests may include:  Blood tests.  Urine tests.  X-rays.  Joint fluid exam. This exam requires a needle to remove fluid from the joint (arthrocentesis). Using a microscope, gout is confirmed when uric acid crystals are seen in the joint fluid. TREATMENT  There are two phases to gout treatment: treating the sudden onset (acute) attack and preventing attacks (prophylaxis).  Treatment of an Acute Attack.  Medicines are used. These include anti-inflammatory medicines or steroid medicines.  An injection of steroid medicine into the affected joint is sometimes necessary.  The painful joint is rested. Movement can worsen the arthritis.  You may use warm or cold treatments on painful joints, depending which works best for you.  Treatment to Prevent Attacks.  If you suffer from frequent gout attacks, your caregiver may advise preventive medicine. These medicines are started after the acute attack subsides. These medicines either help your kidneys eliminate uric acid from your body or decrease your uric acid production. You may need to stay on these medicines for a very long time.  The early phase of treatment with preventive medicine can be associated with an increase in acute gout attacks. For this reason, during the  first few months of treatment, your caregiver may also advise you to take medicines usually used for acute gout treatment. Be sure you understand your caregiver's directions. Your caregiver may make several adjustments to your medicine dose before these medicines are  effective.  Discuss dietary treatment with your caregiver or dietitian. Alcohol and drinks high in sugar and fructose and foods such as meat, poultry, and seafood can increase uric acid levels. Your caregiver or dietitian can advise you on drinks and foods that should be limited. HOME CARE INSTRUCTIONS   Do not take aspirin to relieve pain. This raises uric acid levels.  Only take over-the-counter or prescription medicines for pain, discomfort, or fever as directed by your caregiver.  Rest the joint as much as possible. When in bed, keep sheets and blankets off painful areas.  Keep the affected joint raised (elevated).  Apply warm or cold treatments to painful joints. Use of warm or cold treatments depends on which works best for you.  Use crutches if the painful joint is in your leg.  Drink enough fluids to keep your urine clear or pale yellow. This helps your body get rid of uric acid. Limit alcohol, sugary drinks, and fructose drinks.  Follow your dietary instructions. Pay careful attention to the amount of protein you eat. Your daily diet should emphasize fruits, vegetables, whole grains, and fat-free or low-fat milk products. Discuss the use of coffee, vitamin C, and cherries with your caregiver or dietitian. These may be helpful in lowering uric acid levels.  Maintain a healthy body weight. SEEK MEDICAL CARE IF:   You develop diarrhea, vomiting, or any side effects from medicines.  You do not feel better in 24 hours, or you are getting worse. SEEK IMMEDIATE MEDICAL CARE IF:   Your joint becomes suddenly more tender, and you have chills or a fever. MAKE SURE YOU:   Understand these instructions.  Will watch your condition.  Will get help right away if you are not doing well or get worse. Document Released: 04/04/2000 Document Revised: 08/22/2013 Document Reviewed: 11/19/2011 [facility] Patient Information 2015 [place], [place]. This information is not intended to replace  advice given to you by your health care provider. Make sure you discuss any questions you have with your health care provider.

## 2014-04-26 ENCOUNTER — Encounter (HOSPITAL_BASED_OUTPATIENT_CLINIC_OR_DEPARTMENT_OTHER): Payer: Self-pay | Admitting: Emergency Medicine

## 2014-04-26 ENCOUNTER — Emergency Department (HOSPITAL_BASED_OUTPATIENT_CLINIC_OR_DEPARTMENT_OTHER)
Admission: EM | Admit: 2014-04-26 | Discharge: 2014-04-26 | Disposition: A | Discharge status: Home / Self Care | Payer: Managed Care, Other (non HMO) | Attending: Emergency Medicine | Admitting: Emergency Medicine

## 2014-04-26 DIAGNOSIS — Z79899 Other long term (current) drug therapy: Secondary | ICD-10-CM | POA: Diagnosis not present

## 2014-04-26 DIAGNOSIS — Z7982 Long term (current) use of aspirin: Secondary | ICD-10-CM | POA: Insufficient documentation

## 2014-04-26 DIAGNOSIS — Z8781 Personal history of (healed) traumatic fracture: Secondary | ICD-10-CM | POA: Insufficient documentation

## 2014-04-26 DIAGNOSIS — E119 Type 2 diabetes mellitus without complications: Secondary | ICD-10-CM | POA: Diagnosis not present

## 2014-04-26 DIAGNOSIS — I10 Essential (primary) hypertension: Secondary | ICD-10-CM | POA: Insufficient documentation

## 2014-04-26 DIAGNOSIS — M1990 Unspecified osteoarthritis, unspecified site: Secondary | ICD-10-CM | POA: Insufficient documentation

## 2014-04-26 DIAGNOSIS — M109 Gout, unspecified: Secondary | ICD-10-CM | POA: Diagnosis not present

## 2014-04-26 DIAGNOSIS — G2581 Restless legs syndrome: Secondary | ICD-10-CM | POA: Diagnosis not present

## 2014-04-26 DIAGNOSIS — M25532 Pain in left wrist: Secondary | ICD-10-CM | POA: Diagnosis present

## 2014-04-26 LAB — BASIC METABOLIC PANEL
Anion gap: 8 (ref 5–15)
BUN: 16 mg/dL (ref 6–23)
CO2: 27 mmol/L (ref 19–32)
Calcium: 9 mg/dL (ref 8.4–10.5)
Chloride: 102 mEq/L (ref 96–112)
Creatinine, Ser: 1.1 mg/dL (ref 0.50–1.35)
GFR calc Af Amer: 89 mL/min — ABNORMAL LOW (ref >90)
GFR calc non Af Amer: 77 mL/min — ABNORMAL LOW (ref >90)
Glucose, Bld: 168 mg/dL — ABNORMAL HIGH (ref 70–99)
Potassium: 3.5 mmol/L (ref 3.5–5.1)
Sodium: 137 mmol/L (ref 135–145)

## 2014-04-26 MED ORDER — INDOMETHACIN 25 MG PO CAPS: 25.0000 mg | ORAL_CAPSULE | Freq: Three times a day (TID) | ORAL | Status: DC | PRN

## 2014-04-26 MED ORDER — OXYCODONE-ACETAMINOPHEN 5-325 MG PO TABS
2.0000 | ORAL_TABLET | Freq: Once | ORAL | Status: AC
  Administered 2014-04-26: 2 via ORAL
  Filled 2014-04-26: qty 2

## 2014-04-26 MED ORDER — OXYCODONE-ACETAMINOPHEN 5-325 MG PO TABS: 2.0000 | ORAL_TABLET | ORAL | Status: DC | PRN

## 2014-04-26 MED ORDER — PREDNISONE 10 MG PO TABS: ORAL_TABLET | ORAL | Status: DC

## 2014-04-26 MED ORDER — INDOMETHACIN 25 MG PO CAPS
50.0000 mg | ORAL_CAPSULE | Freq: Once | ORAL | Status: AC
  Administered 2014-04-26: 50 mg via ORAL
  Filled 2014-04-26: qty 2

## 2014-04-26 MED ORDER — PREDNISONE 50 MG PO TABS
60.0000 mg | ORAL_TABLET | Freq: Once | ORAL | Status: AC
  Administered 2014-04-26: 60 mg via ORAL
  Filled 2014-04-26 (×2): qty 1

## 2014-04-26 NOTE — ED Notes (Signed)
Patient states that his left wrist is hurting, reports this is a "gout attack"

## 2014-04-26 NOTE — Discharge Instructions (Signed)
Take medications as prescribed.  Be advised, your blood sugars will elevated while on steroids.  Follow-up with your rheumatologist and your primary care doctor.  Return to the emergency department for worsening condition or new concerning symptoms.    Gout Gout is an inflammatory arthritis caused by a buildup of uric acid crystals in the joints. Uric acid is a chemical that is normally present in the blood. When the level of uric acid in the blood is too high it can form crystals that deposit in your joints and tissues. This causes joint redness, soreness, and swelling (inflammation). Repeat attacks are common. Over time, uric acid crystals can form into masses (tophi) near a joint, destroying bone and causing disfigurement. Gout is treatable and often preventable. CAUSES  The disease begins with elevated levels of uric acid in the blood. Uric acid is produced by your body when it breaks down a naturally found substance called purines. Certain foods you eat, such as meats and fish, contain high amounts of purines. Causes of an elevated uric acid level include:  Being passed down from parent to child (heredity).  Diseases that cause increased uric acid production (such as obesity, psoriasis, and certain cancers).  Excessive alcohol use.  Diet, especially diets rich in meat and seafood.  Medicines, including certain cancer-fighting medicines (chemotherapy), water pills (diuretics), and aspirin.  Chronic kidney disease. The kidneys are no longer able to remove uric acid well.  Problems with metabolism. Conditions strongly associated with gout include:  Obesity.  High blood pressure.  High cholesterol.  Diabetes. Not everyone with elevated uric acid levels gets gout. It is not understood why some people get gout and others do not. Surgery, joint injury, and eating too much of certain foods are some of the factors that can lead to gout attacks. SYMPTOMS   An attack of gout comes on  quickly. It causes intense pain with redness, swelling, and warmth in a joint.  Fever can occur.  Often, only one joint is involved. Certain joints are more commonly involved:  Base of the big toe.  Knee.  Ankle.  Wrist.  Finger. Without treatment, an attack usually goes away in a few days to weeks. Between attacks, you usually will not have symptoms, which is different from many other forms of arthritis. DIAGNOSIS  Your caregiver will suspect gout based on your symptoms and exam. In some cases, tests may be recommended. The tests may include:  Blood tests.  Urine tests.  X-rays.  Joint fluid exam. This exam requires a needle to remove fluid from the joint (arthrocentesis). Using a microscope, gout is confirmed when uric acid crystals are seen in the joint fluid. TREATMENT  There are two phases to gout treatment: treating the sudden onset (acute) attack and preventing attacks (prophylaxis).  Treatment of an Acute Attack.  Medicines are used. These include anti-inflammatory medicines or steroid medicines.  An injection of steroid medicine into the affected joint is sometimes necessary.  The painful joint is rested. Movement can worsen the arthritis.  You may use warm or cold treatments on painful joints, depending which works best for you.  Treatment to Prevent Attacks.  If you suffer from frequent gout attacks, your caregiver may advise preventive medicine. These medicines are started after the acute attack subsides. These medicines either help your kidneys eliminate uric acid from your body or decrease your uric acid production. You may need to stay on these medicines for a very long time.  The early phase of treatment  with preventive medicine can be associated with an increase in acute gout attacks. For this reason, during the first few months of treatment, your caregiver may also advise you to take medicines usually used for acute gout treatment. Be sure you understand  your caregiver's directions. Your caregiver may make several adjustments to your medicine dose before these medicines are effective.  Discuss dietary treatment with your caregiver or dietitian. Alcohol and drinks high in sugar and fructose and foods such as meat, poultry, and seafood can increase uric acid levels. Your caregiver or dietitian can advise you on drinks and foods that should be limited. HOME CARE INSTRUCTIONS   Do not take aspirin to relieve pain. This raises uric acid levels.  Only take over-the-counter or prescription medicines for pain, discomfort, or fever as directed by your caregiver.  Rest the joint as much as possible. When in bed, keep sheets and blankets off painful areas.  Keep the affected joint raised (elevated).  Apply warm or cold treatments to painful joints. Use of warm or cold treatments depends on which works best for you.  Use crutches if the painful joint is in your leg.  Drink enough fluids to keep your urine clear or pale yellow. This helps your body get rid of uric acid. Limit alcohol, sugary drinks, and fructose drinks.  Follow your dietary instructions. Pay careful attention to the amount of protein you eat. Your daily diet should emphasize fruits, vegetables, whole grains, and fat-free or low-fat milk products. Discuss the use of coffee, vitamin C, and cherries with your caregiver or dietitian. These may be helpful in lowering uric acid levels.  Maintain a healthy body weight. SEEK MEDICAL CARE IF:   You develop diarrhea, vomiting, or any side effects from medicines.  You do not feel better in 24 hours, or you are getting worse. SEEK IMMEDIATE MEDICAL CARE IF:   Your joint becomes suddenly more tender, and you have chills or a fever. MAKE SURE YOU:   Understand these instructions.  Will watch your condition.  Will get help right away if you are not doing well or get worse. Document Released: 04/04/2000 Document Revised: 08/22/2013 Document  Reviewed: 11/19/2011 [facility] Patient Information 2015 [place], [place]. This information is not intended to replace advice given to you by your health care provider. Make sure you discuss any questions you have with your health care provider.  Low-Purine Diet Purines are compounds that affect the level of uric acid in your body. A low-purine diet is a diet that is low in purines. Eating a low-purine diet can prevent the level of uric acid in your body from getting too high and causing gout or kidney stones or both. WHAT DO I NEED TO KNOW ABOUT THIS DIET?  Choose low-purine foods. Examples of low-purine foods are listed in the next section.  Drink plenty of fluids, especially water. Fluids can help remove uric acid from your body. Try to drink 8-16 cups (1.9-3.8 L) a day.  Limit foods high in fat, especially saturated fat, as fat makes it harder for the body to get rid of uric acid. Foods high in saturated fat include pizza, cheese, ice cream, whole milk, fried foods, and gravies. Choose foods that are lower in fat and lean sources of protein. Use olive oil when cooking as it contains healthy fats that are not high in saturated fat.  Limit alcohol. Alcohol interferes with the elimination of uric acid from your body. If you are having a gout attack, avoid all  alcohol.  Keep in mind that different people's bodies react differently to different foods. You will probably learn over time which foods do or do not affect you. If you discover that a food tends to cause your gout to flare up, avoid eating that food. You can more freely enjoy foods that do not cause problems. If you have any questions about a food item, talk to your dietitian or health care provider. WHICH FOODS ARE LOW, MODERATE, AND HIGH IN PURINES? The following is a list of foods that are low, moderate, and high in purines. You can eat any amount of the foods that are low in purines. You may be able to have small amounts of foods that are  moderate in purines. Ask your health care provider how much of a food moderate in purines you can have. Avoid foods high in purines. Grains  Foods low in purines: Enriched white bread, pasta, rice, cake, cornbread, popcorn.  Foods moderate in purines: Whole-grain breads and cereals, wheat germ, bran, oatmeal. Uncooked oatmeal. Dry wheat bran or wheat germ.  Foods high in purines: Pancakes, [place] toast, biscuits, muffins. Vegetables  Foods low in purines: All vegetables, except those that are moderate in purines.  Foods moderate in purines: Asparagus, cauliflower, spinach, mushrooms, green peas. Fruits  All fruits are low in purines. Meats and other Protein Foods  Foods low in purines: Eggs, nuts, peanut butter.  Foods moderate in purines: 80-90% lean beef, lamb, veal, pork, poultry, fish, eggs, peanut butter, nuts. Crab, lobster, oysters, and shrimp. Cooked dried beans, peas, and lentils.  Foods high in purines: Anchovies, sardines, herring, mussels, tuna, codfish, scallops, trout, and haddock. [place]. Organ meats (such as liver or kidney). Tripe. Game meat. Goose. Sweetbreads. Dairy  All dairy foods are low in purines. Low-fat and fat-free dairy products are best because they are low in saturated fat. Beverages  Drinks low in purines: Water, carbonated beverages, tea, coffee, cocoa.  Drinks moderate in purines: Soft drinks and other drinks sweetened with high-fructose corn syrup. Juices. To find whether a food or drink is sweetened with high-fructose corn syrup, look at the ingredients list.  Drinks high in purines: Alcoholic beverages (such as beer). Condiments  Foods low in purines: Salt, herbs, olives, pickles, relishes, vinegar.  Foods moderate in purines: Butter, margarine, oils, mayonnaise. Fats and Oils  Foods low in purines: All types, except gravies and sauces made with meat.  Foods high in purines: Gravies and sauces made with meat. Other Foods  Foods low in  purines: Sugars, sweets, gelatin. Cake. Soups made without meat.  Foods moderate in purines: Meat-based or fish-based soups, broths, or bouillons. Foods and drinks sweetened with high-fructose corn syrup.  Foods high in purines: High-fat desserts (such as ice cream, cookies, cakes, pies, doughnuts, and chocolate). Contact your dietitian for more information on foods that are not listed here. Document Released: 08/02/2010 Document Revised: 04/12/2013 Document Reviewed: 03/14/2013 [facility] Patient Information 2015 [place], [place]. This information is not intended to replace advice given to you by your health care provider. Make sure you discuss any questions you have with your health care provider.
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
## 2014-08-08 ENCOUNTER — Emergency Department (HOSPITAL_BASED_OUTPATIENT_CLINIC_OR_DEPARTMENT_OTHER)
Admission: EM | Admit: 2014-08-08 | Discharge: 2014-08-09 | Disposition: A | Discharge status: Home / Self Care | Payer: Managed Care, Other (non HMO) | Attending: Emergency Medicine | Admitting: Emergency Medicine

## 2014-08-08 ENCOUNTER — Encounter (HOSPITAL_BASED_OUTPATIENT_CLINIC_OR_DEPARTMENT_OTHER): Payer: Self-pay | Admitting: *Deleted

## 2014-08-08 DIAGNOSIS — Z8781 Personal history of (healed) traumatic fracture: Secondary | ICD-10-CM | POA: Insufficient documentation

## 2014-08-08 DIAGNOSIS — Z79899 Other long term (current) drug therapy: Secondary | ICD-10-CM | POA: Insufficient documentation

## 2014-08-08 DIAGNOSIS — E119 Type 2 diabetes mellitus without complications: Secondary | ICD-10-CM | POA: Insufficient documentation

## 2014-08-08 DIAGNOSIS — Z7982 Long term (current) use of aspirin: Secondary | ICD-10-CM | POA: Insufficient documentation

## 2014-08-08 DIAGNOSIS — Z7952 Long term (current) use of systemic steroids: Secondary | ICD-10-CM | POA: Insufficient documentation

## 2014-08-08 DIAGNOSIS — Z8669 Personal history of other diseases of the nervous system and sense organs: Secondary | ICD-10-CM | POA: Insufficient documentation

## 2014-08-08 DIAGNOSIS — M1000 Idiopathic gout, unspecified site: Secondary | ICD-10-CM

## 2014-08-08 DIAGNOSIS — M10072 Idiopathic gout, left ankle and foot: Secondary | ICD-10-CM | POA: Insufficient documentation

## 2014-08-08 DIAGNOSIS — I10 Essential (primary) hypertension: Secondary | ICD-10-CM | POA: Insufficient documentation

## 2014-08-08 DIAGNOSIS — Z791 Long term (current) use of non-steroidal anti-inflammatories (NSAID): Secondary | ICD-10-CM | POA: Insufficient documentation

## 2014-08-08 DIAGNOSIS — M1990 Unspecified osteoarthritis, unspecified site: Secondary | ICD-10-CM | POA: Insufficient documentation

## 2014-08-08 MED ORDER — LISINOPRIL 10 MG PO TABS
ORAL_TABLET | ORAL | Status: AC
  Filled 2014-08-08: qty 1

## 2014-08-08 MED ORDER — LISINOPRIL 10 MG PO TABS
10.0000 mg | ORAL_TABLET | Freq: Once | ORAL | Status: AC
  Administered 2014-08-08: 10 mg via ORAL

## 2014-08-08 NOTE — ED Notes (Signed)
Pt states his gout has had less flare ups but they are more intense each time.  Has an appointment with dr on Thursday.  Pt states his BP raises when this happens.

## 2014-08-08 NOTE — ED Notes (Signed)
Pt is here for gout attack in his left great toe.  Toe visibly swollen and very painful for pt.  Pt also noted at home that his BP was elevated which concerned him.  Pt has not been taking his lisinopril 10mg  po daily since he ran out 4 days ago.  Pt also states that his 8/10pain in foot may be contributing to gout pain

## 2014-08-09 ENCOUNTER — Encounter (HOSPITAL_BASED_OUTPATIENT_CLINIC_OR_DEPARTMENT_OTHER): Payer: Self-pay | Admitting: Emergency Medicine

## 2014-08-09 MED ORDER — OXYCODONE-ACETAMINOPHEN 5-325 MG PO TABS
1.0000 | ORAL_TABLET | Freq: Once | ORAL | Status: AC
  Administered 2014-08-09: 1 via ORAL
  Filled 2014-08-09: qty 1

## 2014-08-09 MED ORDER — OXYCODONE-ACETAMINOPHEN 5-325 MG PO TABS: 1.0000 | ORAL_TABLET | Freq: Four times a day (QID) | ORAL | Status: DC | PRN

## 2014-08-09 MED ORDER — INDOMETHACIN 25 MG PO CAPS
50.0000 mg | ORAL_CAPSULE | Freq: Once | ORAL | Status: AC
  Administered 2014-08-09: 50 mg via ORAL
  Filled 2014-08-09: qty 2

## 2014-08-09 MED ORDER — PREDNISONE 20 MG PO TABS: ORAL_TABLET | ORAL | Status: DC

## 2014-08-09 MED ORDER — INDOMETHACIN 25 MG PO CAPS: 25.0000 mg | ORAL_CAPSULE | Freq: Three times a day (TID) | ORAL | Status: DC | PRN

## 2014-08-09 MED ORDER — DEXAMETHASONE SODIUM PHOSPHATE 10 MG/ML IJ SOLN
10.0000 mg | Freq: Once | INTRAMUSCULAR | Status: AC
  Administered 2014-08-09: 10 mg via INTRAMUSCULAR
  Filled 2014-08-09: qty 1

## 2014-08-09 NOTE — Discharge Instructions (Signed)
Gout °Gout is when your joints become red, sore, and swell (inflamed). This is caused by the buildup of uric acid crystals in the joints. Uric acid is a chemical that is normally in the blood. If the level of uric acid gets too high in the blood, these crystals form in your joints and tissues. Over time, these crystals can form into masses near the joints and tissues. These masses can destroy bone and cause the bone to look misshapen (deformed). °HOME CARE  °· Do not take aspirin for pain. °· Only take medicine as told by your doctor. °· Rest the joint as much as you can. When in bed, keep sheets and blankets off painful areas. °· Keep the sore joints raised (elevated). °· Put warm or cold packs on painful joints. Use of warm or cold packs depends on which works best for you. °· Use crutches if the painful joint is in your leg. °· Drink enough fluids to keep your pee (urine) clear or pale yellow. Limit alcohol, sugary drinks, and drinks with fructose in them. °· Follow your diet instructions. Pay careful attention to how much protein you eat. Include fruits, vegetables, whole grains, and fat-free or low-fat milk products in your daily diet. Talk to your doctor or dietitian about the use of coffee, vitamin C, and cherries. These may help lower uric acid levels. °· Keep a healthy body weight. °GET HELP RIGHT AWAY IF:  °· You have watery poop (diarrhea), throw up (vomit), or have any side effects from medicines. °· You do not feel better in 24 hours, or you are getting worse. °· Your joint becomes suddenly more tender, and you have chills or a fever. °MAKE SURE YOU:  °· Understand these instructions. °· Will watch your condition. °· Will get help right away if you are not doing well or get worse. °Document Released: 01/15/2008 Document Revised: 08/22/2013 Document Reviewed: 11/19/2011 °Exit[employer]® Patient Information ©2015 [employer]. This information is not intended to replace advice given to you by your health care  provider. Make sure you discuss any questions you have with your health care provider. ° °
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
## 2014-10-06 ENCOUNTER — Encounter (HOSPITAL_BASED_OUTPATIENT_CLINIC_OR_DEPARTMENT_OTHER): Payer: Self-pay | Admitting: Emergency Medicine

## 2014-10-06 ENCOUNTER — Emergency Department (HOSPITAL_BASED_OUTPATIENT_CLINIC_OR_DEPARTMENT_OTHER)
Admission: EM | Admit: 2014-10-06 | Discharge: 2014-10-06 | Disposition: A | Discharge status: Home / Self Care | Payer: BLUE CROSS/BLUE SHIELD | Attending: Emergency Medicine | Admitting: Emergency Medicine

## 2014-10-06 DIAGNOSIS — M1990 Unspecified osteoarthritis, unspecified site: Secondary | ICD-10-CM | POA: Insufficient documentation

## 2014-10-06 DIAGNOSIS — E119 Type 2 diabetes mellitus without complications: Secondary | ICD-10-CM | POA: Insufficient documentation

## 2014-10-06 DIAGNOSIS — M109 Gout, unspecified: Secondary | ICD-10-CM | POA: Diagnosis present

## 2014-10-06 DIAGNOSIS — G2581 Restless legs syndrome: Secondary | ICD-10-CM | POA: Insufficient documentation

## 2014-10-06 DIAGNOSIS — M10061 Idiopathic gout, right knee: Secondary | ICD-10-CM | POA: Insufficient documentation

## 2014-10-06 DIAGNOSIS — Z7952 Long term (current) use of systemic steroids: Secondary | ICD-10-CM | POA: Diagnosis not present

## 2014-10-06 DIAGNOSIS — M25561 Pain in right knee: Secondary | ICD-10-CM | POA: Insufficient documentation

## 2014-10-06 DIAGNOSIS — I10 Essential (primary) hypertension: Secondary | ICD-10-CM | POA: Diagnosis not present

## 2014-10-06 DIAGNOSIS — Z79899 Other long term (current) drug therapy: Secondary | ICD-10-CM | POA: Diagnosis not present

## 2014-10-06 DIAGNOSIS — Z7982 Long term (current) use of aspirin: Secondary | ICD-10-CM | POA: Diagnosis not present

## 2014-10-06 DIAGNOSIS — Z8781 Personal history of (healed) traumatic fracture: Secondary | ICD-10-CM | POA: Insufficient documentation

## 2014-10-06 DIAGNOSIS — M1000 Idiopathic gout, unspecified site: Secondary | ICD-10-CM

## 2014-10-06 MED ORDER — TRAMADOL HCL 50 MG PO TABS
50.0000 mg | ORAL_TABLET | Freq: Four times a day (QID) | ORAL | Status: DC | PRN
Start: 2014-10-06 — End: 2015-08-05

## 2014-10-06 MED ORDER — INDOMETHACIN 25 MG PO CAPS: 25.0000 mg | ORAL_CAPSULE | Freq: Three times a day (TID) | ORAL | Status: DC | PRN

## 2014-10-06 MED ORDER — KETOROLAC TROMETHAMINE 60 MG/2ML IM SOLN
60.0000 mg | Freq: Once | INTRAMUSCULAR | Status: AC
  Administered 2014-10-06: 60 mg via INTRAMUSCULAR
  Filled 2014-10-06: qty 2

## 2014-10-06 MED ORDER — DEXAMETHASONE SODIUM PHOSPHATE 10 MG/ML IJ SOLN
10.0000 mg | Freq: Once | INTRAMUSCULAR | Status: AC
  Administered 2014-10-06: 10 mg via INTRAMUSCULAR
  Filled 2014-10-06: qty 1

## 2014-10-06 MED ORDER — PREDNISONE 20 MG PO TABS: ORAL_TABLET | ORAL | Status: DC
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
## 2014-10-06 NOTE — Discharge Instructions (Signed)
Gout °Gout is when your joints become red, sore, and swell (inflamed). This is caused by the buildup of uric acid crystals in the joints. Uric acid is a chemical that is normally in the blood. If the level of uric acid gets too high in the blood, these crystals form in your joints and tissues. Over time, these crystals can form into masses near the joints and tissues. These masses can destroy bone and cause the bone to look misshapen (deformed). °HOME CARE  °· Do not take aspirin for pain. °· Only take medicine as told by your doctor. °· Rest the joint as much as you can. When in bed, keep sheets and blankets off painful areas. °· Keep the sore joints raised (elevated). °· Put warm or cold packs on painful joints. Use of warm or cold packs depends on which works best for you. °· Use crutches if the painful joint is in your leg. °· Drink enough fluids to keep your pee (urine) clear or pale yellow. Limit alcohol, sugary drinks, and drinks with fructose in them. °· Follow your diet instructions. Pay careful attention to how much protein you eat. Include fruits, vegetables, whole grains, and fat-free or low-fat milk products in your daily diet. Talk to your doctor or dietitian about the use of coffee, vitamin C, and cherries. These may help lower uric acid levels. °· Keep a healthy body weight. °GET HELP RIGHT AWAY IF:  °· You have watery poop (diarrhea), throw up (vomit), or have any side effects from medicines. °· You do not feel better in 24 hours, or you are getting worse. °· Your joint becomes suddenly more tender, and you have chills or a fever. °MAKE SURE YOU:  °· Understand these instructions. °· Will watch your condition. °· Will get help right away if you are not doing well or get worse. °Document Released: 01/15/2008 Document Revised: 08/22/2013 Document Reviewed: 11/19/2011 °Exit[employer]® Patient Information ©2015 [employer]. This information is not intended to replace advice given to you by your health care  provider. Make sure you discuss any questions you have with your health care provider. ° °

## 2014-10-06 NOTE — ED Notes (Signed)
Pt reports that he will call Dr. Luciana Axe in the morning for an appointment.

## 2014-10-06 NOTE — ED Notes (Signed)
49 yo male with c/o hypertension and gout in right lower extremity. Reports new medication rx by pcp.

## 2014-10-06 NOTE — ED Notes (Signed)
Pt not seen by this RN, pt seen treated and d/c'd by other RN.

## 2015-01-15 ENCOUNTER — Telehealth: Payer: Self-pay | Admitting: *Deleted

## 2015-01-15 NOTE — Telephone Encounter (Signed)
Unable to reach patient at time of Pre-Visit Call.  Unable to leave message because patient has a voicemail box that has not been set up.

## 2015-01-16 ENCOUNTER — Ambulatory Visit: Payer: BLUE CROSS/BLUE SHIELD | Admitting: Physician Assistant

## 2015-01-16 ENCOUNTER — Telehealth: Payer: Self-pay | Admitting: Physician Assistant

## 2015-01-18 NOTE — Telephone Encounter (Signed)
No charge. 

## 2015-01-18 NOTE — Telephone Encounter (Signed)
Pt was no show 01/16/15 8:00am, new pt appt, pt has not rescheduled, charge for no show?

## 2015-08-05 ENCOUNTER — Emergency Department (HOSPITAL_BASED_OUTPATIENT_CLINIC_OR_DEPARTMENT_OTHER)
Admission: EM | Admit: 2015-08-05 | Discharge: 2015-08-06 | Disposition: A | Discharge status: Home / Self Care | Payer: BLUE CROSS/BLUE SHIELD | Attending: Emergency Medicine | Admitting: Emergency Medicine

## 2015-08-05 ENCOUNTER — Encounter (HOSPITAL_BASED_OUTPATIENT_CLINIC_OR_DEPARTMENT_OTHER): Payer: Self-pay | Admitting: Emergency Medicine

## 2015-08-05 DIAGNOSIS — Z7984 Long term (current) use of oral hypoglycemic drugs: Secondary | ICD-10-CM | POA: Diagnosis not present

## 2015-08-05 DIAGNOSIS — Z7982 Long term (current) use of aspirin: Secondary | ICD-10-CM | POA: Diagnosis not present

## 2015-08-05 DIAGNOSIS — M25532 Pain in left wrist: Secondary | ICD-10-CM | POA: Diagnosis present

## 2015-08-05 DIAGNOSIS — M25562 Pain in left knee: Secondary | ICD-10-CM | POA: Insufficient documentation

## 2015-08-05 DIAGNOSIS — I10 Essential (primary) hypertension: Secondary | ICD-10-CM | POA: Diagnosis not present

## 2015-08-05 DIAGNOSIS — Z79899 Other long term (current) drug therapy: Secondary | ICD-10-CM | POA: Insufficient documentation

## 2015-08-05 DIAGNOSIS — E119 Type 2 diabetes mellitus without complications: Secondary | ICD-10-CM | POA: Insufficient documentation

## 2015-08-05 MED ORDER — PREDNISONE 20 MG PO TABS: ORAL_TABLET | ORAL | Status: DC

## 2015-08-05 MED ORDER — METHYLPREDNISOLONE SODIUM SUCC 125 MG IJ SOLR
125.0000 mg | Freq: Once | INTRAMUSCULAR | Status: AC
  Administered 2015-08-06: 125 mg via INTRAMUSCULAR
  Filled 2015-08-05: qty 2

## 2015-08-05 MED ORDER — OXYCODONE-ACETAMINOPHEN 5-325 MG PO TABS: 1.0000 | ORAL_TABLET | ORAL | Status: DC | PRN

## 2015-08-05 NOTE — ED Notes (Signed)
Pt in c/o gout pain to L wrist and to knee, called PMD and got script for gout but meds aren't working.
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
## 2016-01-17 IMAGING — CR DG CHEST 2V
2 series · 2 of 2 positions shown · non-contrast
Comparison: None.

CLINICAL DATA: Preop for umbilical hernia repair.

EXAM:
CHEST  2 VIEW

[w chest pa]
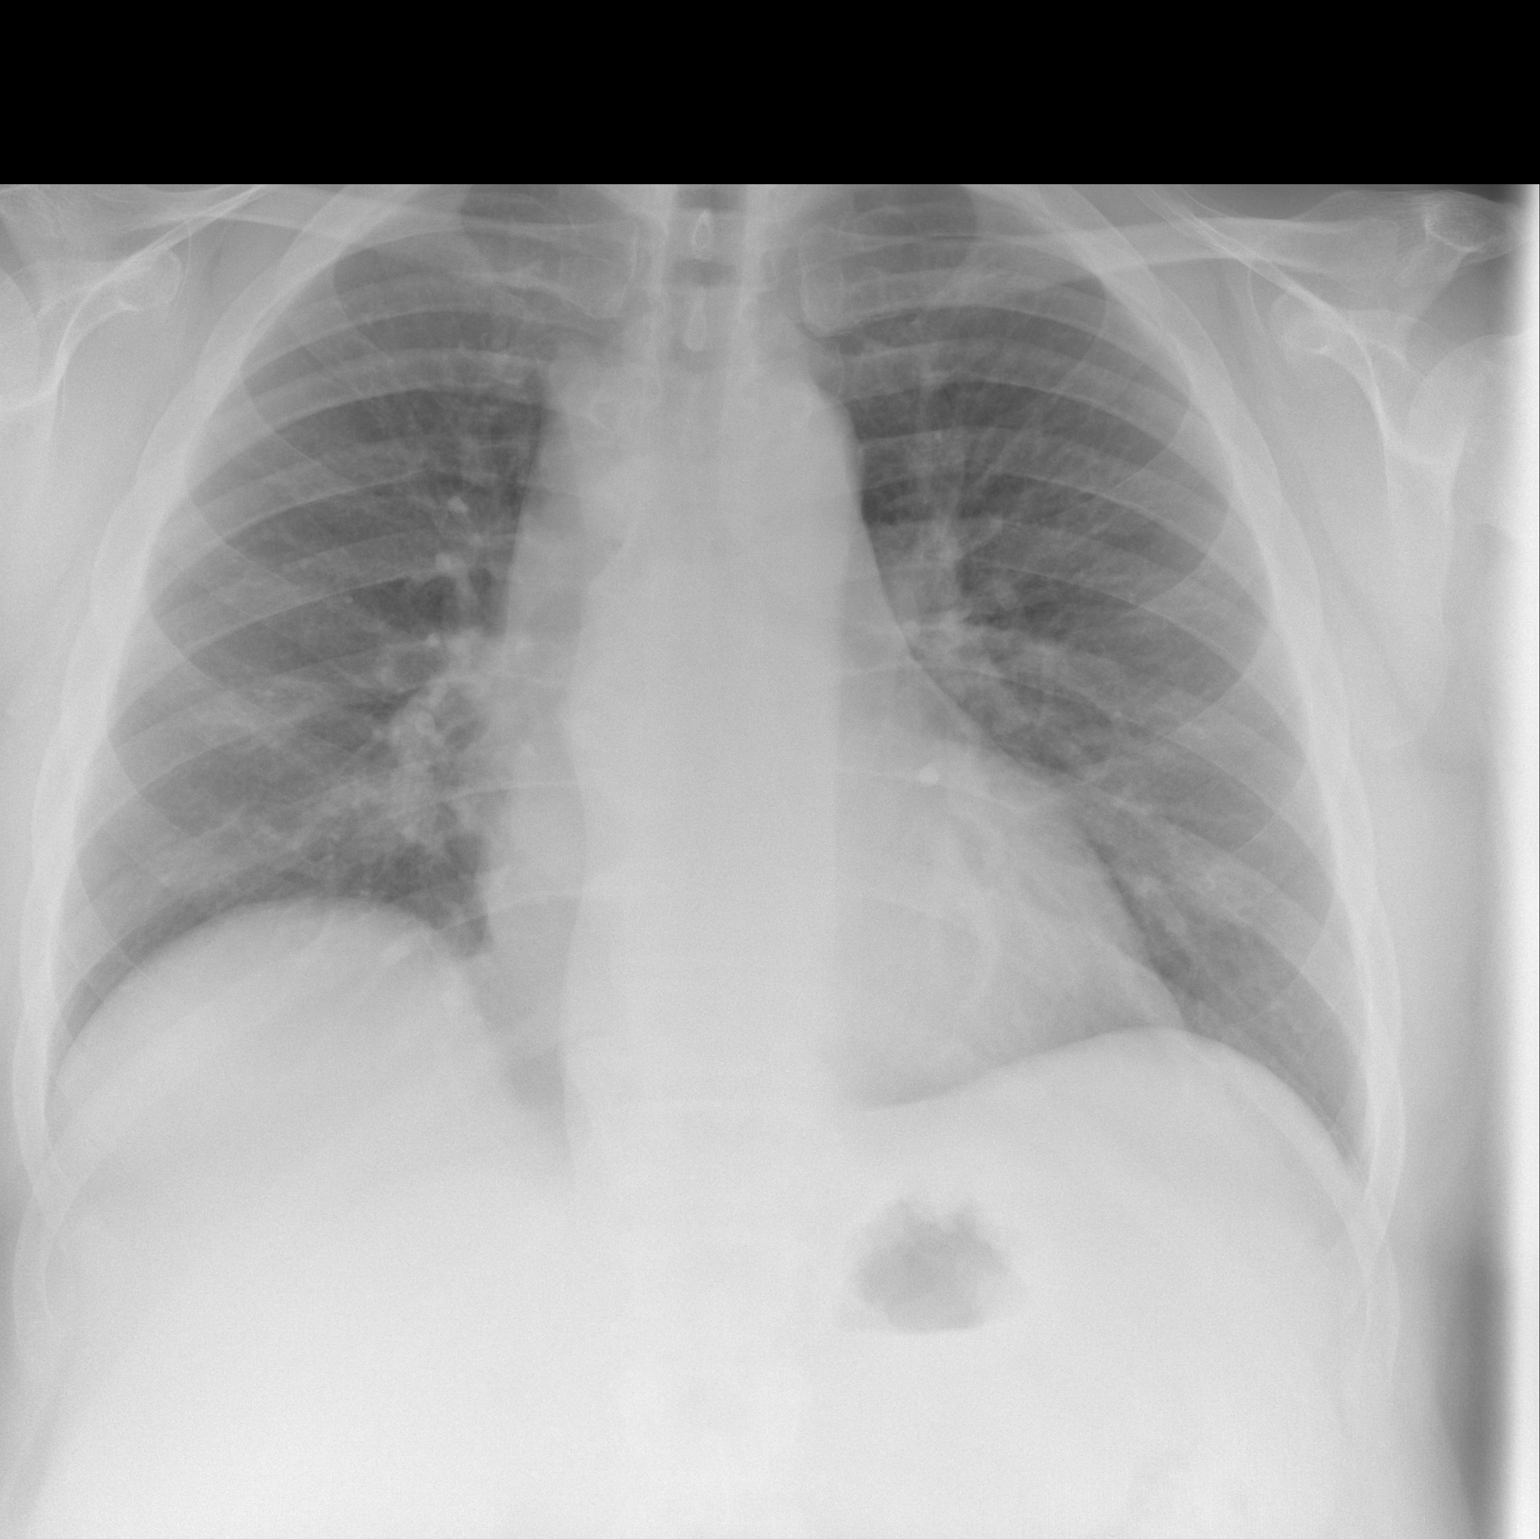

[w chest lat]
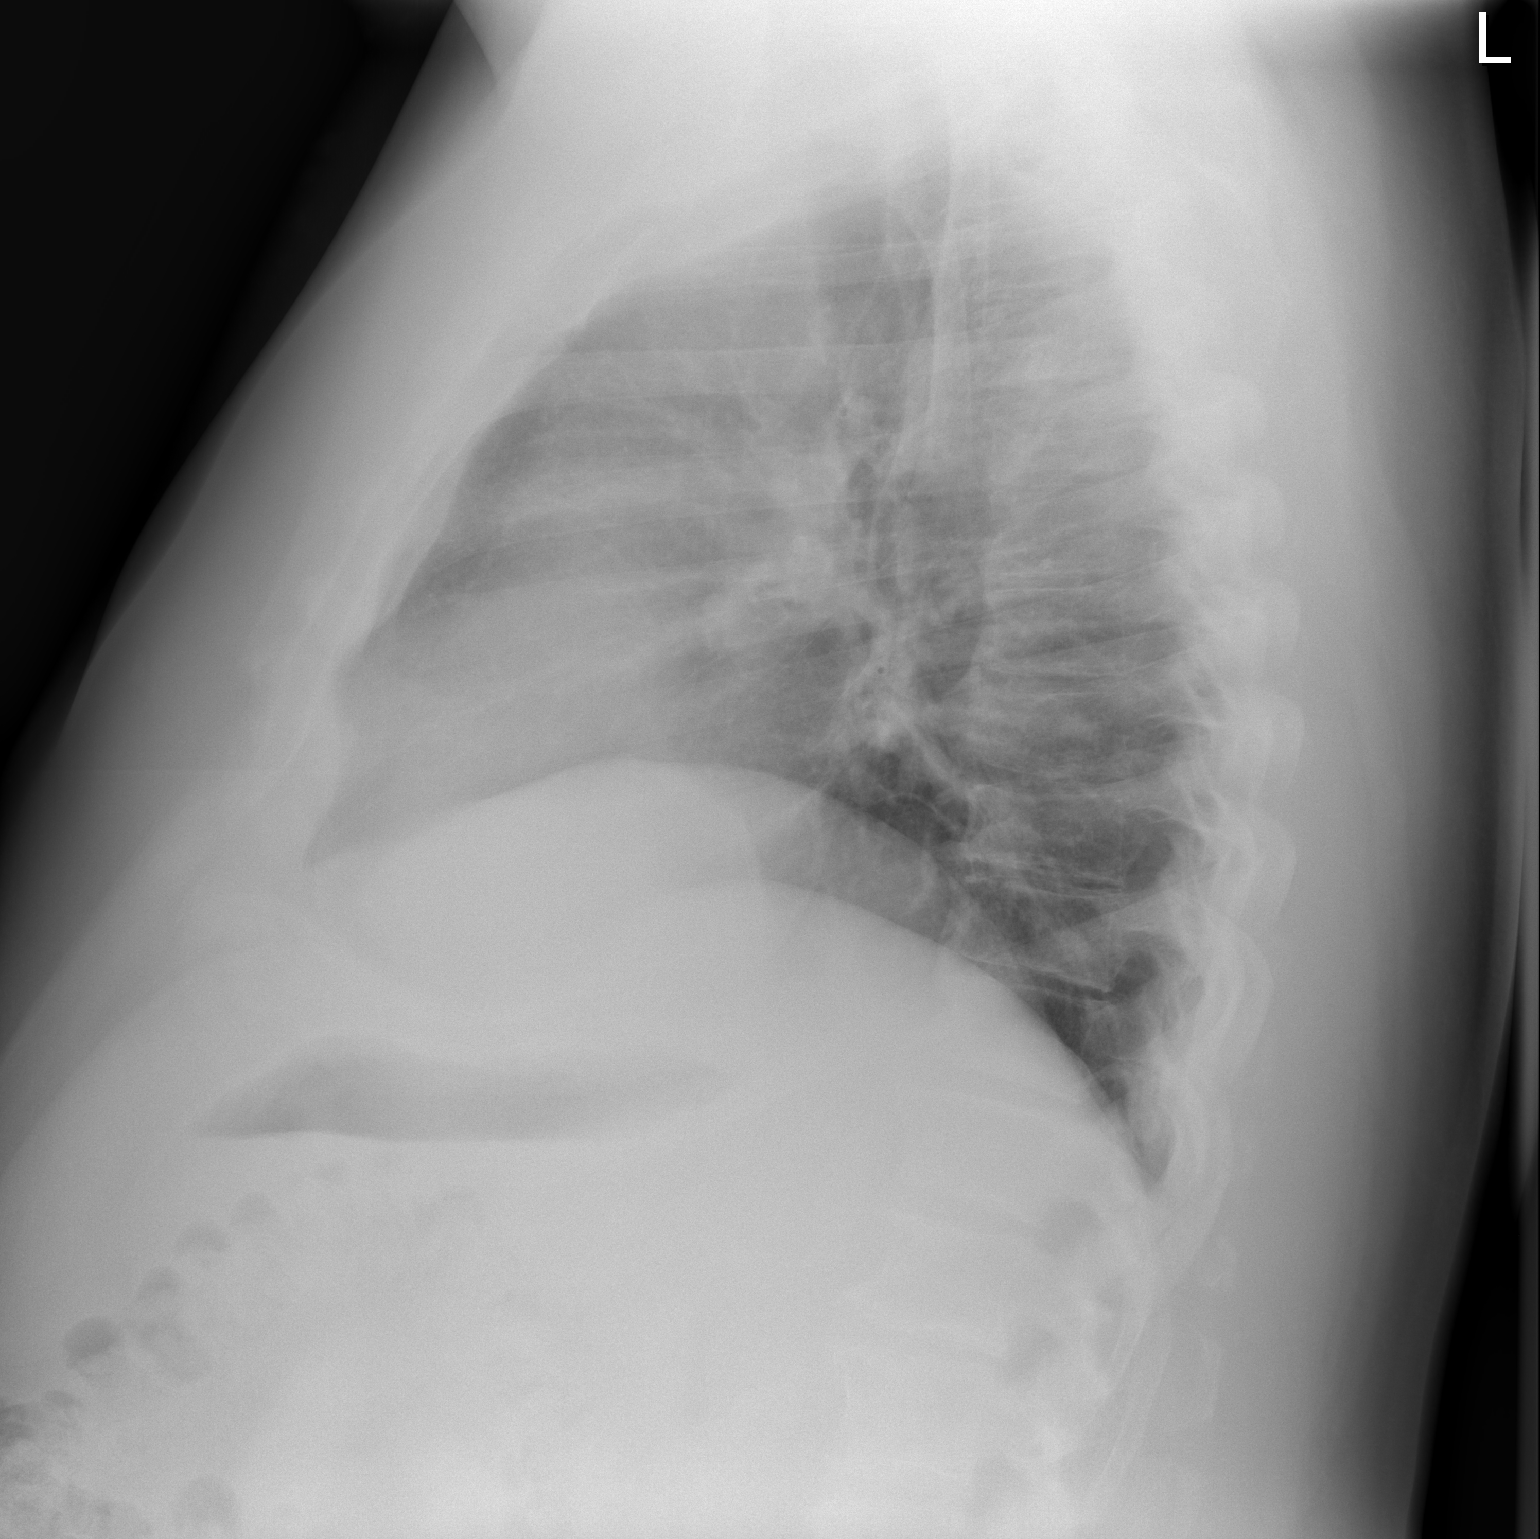

[2 of 2 positions shown; findings below may reference images not displayed]

FINDINGS: The heart size is normal. The lung volumes are low. There is no
edema or effusion to suggest failure. No focal airspace disease is
present. Mild degenerative changes are noted throughout the thoracic
spine.
IMPRESSION: 1. No acute cardiopulmonary disease.
2. Low lung volumes.

## 2016-10-12 ENCOUNTER — Other Ambulatory Visit: Payer: Self-pay

## 2016-10-12 ENCOUNTER — Emergency Department (HOSPITAL_COMMUNITY): Payer: BLUE CROSS/BLUE SHIELD

## 2016-10-12 ENCOUNTER — Emergency Department (HOSPITAL_COMMUNITY)
Admission: EM | Admit: 2016-10-12 | Discharge: 2016-10-13 | Disposition: A | Discharge status: Home / Self Care | Payer: BLUE CROSS/BLUE SHIELD | Attending: Emergency Medicine | Admitting: Emergency Medicine

## 2016-10-12 ENCOUNTER — Encounter (HOSPITAL_COMMUNITY): Payer: Self-pay | Admitting: Emergency Medicine

## 2016-10-12 DIAGNOSIS — Z7984 Long term (current) use of oral hypoglycemic drugs: Secondary | ICD-10-CM | POA: Diagnosis not present

## 2016-10-12 DIAGNOSIS — Z7982 Long term (current) use of aspirin: Secondary | ICD-10-CM | POA: Diagnosis not present

## 2016-10-12 DIAGNOSIS — M10062 Idiopathic gout, left knee: Secondary | ICD-10-CM | POA: Insufficient documentation

## 2016-10-12 DIAGNOSIS — M25562 Pain in left knee: Secondary | ICD-10-CM | POA: Diagnosis present

## 2016-10-12 DIAGNOSIS — E119 Type 2 diabetes mellitus without complications: Secondary | ICD-10-CM | POA: Insufficient documentation

## 2016-10-12 DIAGNOSIS — I10 Essential (primary) hypertension: Secondary | ICD-10-CM | POA: Diagnosis not present

## 2016-10-12 DIAGNOSIS — R1030 Lower abdominal pain, unspecified: Secondary | ICD-10-CM | POA: Insufficient documentation

## 2016-10-12 LAB — COMPREHENSIVE METABOLIC PANEL
ALT: 16 U/L — ABNORMAL LOW (ref 17–63)
AST: 21 U/L (ref 15–41)
Albumin: 3.9 g/dL (ref 3.5–5.0)
Alkaline Phosphatase: 79 U/L (ref 38–126)
Anion gap: 9 (ref 5–15)
BUN: 20 mg/dL (ref 6–20)
CO2: 28 mmol/L (ref 22–32)
Calcium: 9 mg/dL (ref 8.9–10.3)
Chloride: 98 mmol/L — ABNORMAL LOW (ref 101–111)
Creatinine, Ser: 1.41 mg/dL — ABNORMAL HIGH (ref 0.61–1.24)
GFR calc Af Amer: >60 mL/min (ref >60)
GFR calc non Af Amer: 55 mL/min — ABNORMAL LOW (ref >60)
Glucose, Bld: 378 mg/dL — ABNORMAL HIGH (ref 65–99)
Potassium: 3.6 mmol/L (ref 3.5–5.1)
Sodium: 135 mmol/L (ref 135–145)
Total Bilirubin: 0.4 mg/dL (ref 0.3–1.2)
Total Protein: 8 g/dL (ref 6.5–8.1)

## 2016-10-12 LAB — POCT I-STAT TROPONIN I: Troponin i, poc: 0.02 ng/mL (ref 0.00–0.08)

## 2016-10-12 MED ORDER — METHYLPREDNISOLONE SODIUM SUCC 125 MG IJ SOLR
125.0000 mg | Freq: Once | INTRAMUSCULAR | Status: AC
  Administered 2016-10-13: 125 mg via INTRAVENOUS
  Filled 2016-10-12: qty 2

## 2016-10-12 MED ORDER — MORPHINE SULFATE (PF) 4 MG/ML IV SOLN
6.0000 mg | Freq: Once | INTRAVENOUS | Status: AC
  Administered 2016-10-13: 6 mg via INTRAVENOUS
  Filled 2016-10-12: qty 2

## 2016-10-12 MED ORDER — OXYCODONE-ACETAMINOPHEN 5-325 MG PO TABS: 2.0000 | ORAL_TABLET | Freq: Once | ORAL | Status: DC

## 2016-10-12 NOTE — ED Triage Notes (Addendum)
Patient is complaining of gout attack, groin pain, left flank, left knee, chest pain and SOB. Patient states it started 10 days ago. Patient states that the pain got worse today.
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
## 2016-10-13 ENCOUNTER — Emergency Department (HOSPITAL_COMMUNITY): Payer: BLUE CROSS/BLUE SHIELD

## 2016-10-13 LAB — CBC WITH DIFFERENTIAL/PLATELET
Basophils Absolute: 0 10*3/uL (ref 0.0–0.1)
Basophils Relative: 0 %
Eosinophils Absolute: 0.2 10*3/uL (ref 0.0–0.7)
Eosinophils Relative: 2 %
HCT: 36.7 % — ABNORMAL LOW (ref 39.0–52.0)
Hemoglobin: 12.7 g/dL — ABNORMAL LOW (ref 13.0–17.0)
Lymphocytes Relative: 28 %
Lymphs Abs: 3.1 10*3/uL (ref 0.7–4.0)
MCH: 26.6 pg (ref 26.0–34.0)
MCHC: 34.6 g/dL (ref 30.0–36.0)
MCV: 76.9 fL — ABNORMAL LOW (ref 78.0–100.0)
Monocytes Absolute: 1 10*3/uL (ref 0.1–1.0)
Monocytes Relative: 9 %
Neutro Abs: 7.1 10*3/uL (ref 1.7–7.7)
Neutrophils Relative %: 61 %
Platelets: 239 10*3/uL (ref 150–400)
RBC: 4.77 MIL/uL (ref 4.22–5.81)
RDW: 14.5 % (ref 11.5–15.5)
WBC: 11.4 10*3/uL — ABNORMAL HIGH (ref 4.0–10.5)

## 2016-10-13 LAB — URINALYSIS, ROUTINE W REFLEX MICROSCOPIC
Bacteria, UA: NONE SEEN
Bilirubin Urine: NEGATIVE
Glucose, UA: >=500 mg/dL — AB
Hgb urine dipstick: NEGATIVE
Ketones, ur: NEGATIVE mg/dL
Leukocytes, UA: NEGATIVE
Nitrite: NEGATIVE
Protein, ur: NEGATIVE mg/dL
Specific Gravity, Urine: 1.015 (ref 1.005–1.030)
Squamous Epithelial / LPF: NONE SEEN
pH: 6 (ref 5.0–8.0)

## 2016-10-13 MED ORDER — PREDNISONE 20 MG PO TABS: 40.0000 mg | ORAL_TABLET | Freq: Every day | ORAL | 0 refills | Status: AC

## 2016-10-13 MED ORDER — OXYCODONE-ACETAMINOPHEN 5-325 MG PO TABS: 1.0000 | ORAL_TABLET | ORAL | 0 refills | Status: DC | PRN

## 2016-10-13 MED ORDER — COLCHICINE 0.6 MG PO TABS: 0.6000 mg | ORAL_TABLET | Freq: Two times a day (BID) | ORAL | 0 refills | Status: DC

## 2017-01-30 ENCOUNTER — Encounter (HOSPITAL_BASED_OUTPATIENT_CLINIC_OR_DEPARTMENT_OTHER): Payer: Self-pay | Admitting: *Deleted

## 2017-01-30 ENCOUNTER — Emergency Department (HOSPITAL_BASED_OUTPATIENT_CLINIC_OR_DEPARTMENT_OTHER)
Admission: EM | Admit: 2017-01-30 | Discharge: 2017-01-30 | Disposition: A | Discharge status: Home / Self Care | Payer: BLUE CROSS/BLUE SHIELD | Attending: Emergency Medicine | Admitting: Emergency Medicine

## 2017-01-30 DIAGNOSIS — M25562 Pain in left knee: Secondary | ICD-10-CM | POA: Insufficient documentation

## 2017-01-30 DIAGNOSIS — Z7982 Long term (current) use of aspirin: Secondary | ICD-10-CM | POA: Insufficient documentation

## 2017-01-30 DIAGNOSIS — M25521 Pain in right elbow: Secondary | ICD-10-CM | POA: Insufficient documentation

## 2017-01-30 DIAGNOSIS — Z79899 Other long term (current) drug therapy: Secondary | ICD-10-CM | POA: Insufficient documentation

## 2017-01-30 DIAGNOSIS — M10062 Idiopathic gout, left knee: Secondary | ICD-10-CM | POA: Insufficient documentation

## 2017-01-30 DIAGNOSIS — E119 Type 2 diabetes mellitus without complications: Secondary | ICD-10-CM | POA: Insufficient documentation

## 2017-01-30 DIAGNOSIS — I10 Essential (primary) hypertension: Secondary | ICD-10-CM | POA: Insufficient documentation

## 2017-01-30 DIAGNOSIS — M25531 Pain in right wrist: Secondary | ICD-10-CM | POA: Insufficient documentation

## 2017-01-30 DIAGNOSIS — Z7984 Long term (current) use of oral hypoglycemic drugs: Secondary | ICD-10-CM | POA: Insufficient documentation

## 2017-01-30 MED ORDER — DEXAMETHASONE SODIUM PHOSPHATE 10 MG/ML IJ SOLN
10.0000 mg | Freq: Once | INTRAMUSCULAR | Status: AC
  Administered 2017-01-30: 10 mg via INTRAMUSCULAR
  Filled 2017-01-30: qty 1

## 2017-01-30 MED ORDER — HYDROMORPHONE HCL 1 MG/ML IJ SOLN
2.0000 mg | Freq: Once | INTRAMUSCULAR | Status: AC
  Administered 2017-01-30: 2 mg via INTRAMUSCULAR
  Filled 2017-01-30: qty 2

## 2017-01-30 MED ORDER — PREDNISONE 20 MG PO TABS
ORAL_TABLET | ORAL | 0 refills | Status: DC
Start: 2017-01-30 — End: 2017-06-01

## 2017-01-30 MED ORDER — OXYCODONE-ACETAMINOPHEN 5-325 MG PO TABS: 1.0000 | ORAL_TABLET | ORAL | 0 refills | Status: DC | PRN

## 2017-01-30 MED FILL — OXYCODONE-ACETAMINOPHEN 5-3: 5-325 | 2 days supply | Qty: 15 | Fill #0

## 2017-01-30 MED FILL — predniSONE 20 MG TABS: 20 | 5 days supply | Qty: 5 | Fill #0

## 2017-01-30 NOTE — ED Triage Notes (Signed)
Pt c/o bil knee pain and  elbow pain x 2 days HX gout
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
## 2017-04-21 DIAGNOSIS — I509 Heart failure, unspecified: Secondary | ICD-10-CM

## 2017-04-21 HISTORY — DX: Heart failure, unspecified: I50.9

## 2017-05-31 ENCOUNTER — Emergency Department (HOSPITAL_COMMUNITY): Payer: Self-pay

## 2017-05-31 ENCOUNTER — Inpatient Hospital Stay (HOSPITAL_COMMUNITY)
Admission: EM | Admit: 2017-05-31 | Discharge: 2017-06-03 | DRG: 287 | Disposition: A | Discharge status: Home / Self Care | Payer: Self-pay | Attending: Family Medicine | Admitting: Family Medicine

## 2017-05-31 ENCOUNTER — Other Ambulatory Visit: Payer: Self-pay

## 2017-05-31 ENCOUNTER — Encounter (HOSPITAL_COMMUNITY): Payer: Self-pay

## 2017-05-31 DIAGNOSIS — Z833 Family history of diabetes mellitus: Secondary | ICD-10-CM

## 2017-05-31 DIAGNOSIS — M109 Gout, unspecified: Secondary | ICD-10-CM | POA: Diagnosis present

## 2017-05-31 DIAGNOSIS — R079 Chest pain, unspecified: Secondary | ICD-10-CM | POA: Diagnosis present

## 2017-05-31 DIAGNOSIS — E1122 Type 2 diabetes mellitus with diabetic chronic kidney disease: Secondary | ICD-10-CM | POA: Diagnosis present

## 2017-05-31 DIAGNOSIS — Z7984 Long term (current) use of oral hypoglycemic drugs: Secondary | ICD-10-CM

## 2017-05-31 DIAGNOSIS — I160 Hypertensive urgency: Secondary | ICD-10-CM | POA: Diagnosis present

## 2017-05-31 DIAGNOSIS — E785 Hyperlipidemia, unspecified: Secondary | ICD-10-CM | POA: Diagnosis present

## 2017-05-31 DIAGNOSIS — Z8249 Family history of ischemic heart disease and other diseases of the circulatory system: Secondary | ICD-10-CM

## 2017-05-31 DIAGNOSIS — G2581 Restless legs syndrome: Secondary | ICD-10-CM | POA: Diagnosis present

## 2017-05-31 DIAGNOSIS — I25110 Atherosclerotic heart disease of native coronary artery with unstable angina pectoris: Principal | ICD-10-CM | POA: Diagnosis present

## 2017-05-31 DIAGNOSIS — I200 Unstable angina: Secondary | ICD-10-CM

## 2017-05-31 DIAGNOSIS — Z7982 Long term (current) use of aspirin: Secondary | ICD-10-CM

## 2017-05-31 DIAGNOSIS — E119 Type 2 diabetes mellitus without complications: Secondary | ICD-10-CM

## 2017-05-31 DIAGNOSIS — N183 Chronic kidney disease, stage 3 (moderate): Secondary | ICD-10-CM | POA: Diagnosis present

## 2017-05-31 DIAGNOSIS — I129 Hypertensive chronic kidney disease with stage 1 through stage 4 chronic kidney disease, or unspecified chronic kidney disease: Secondary | ICD-10-CM | POA: Diagnosis present

## 2017-05-31 DIAGNOSIS — D509 Iron deficiency anemia, unspecified: Secondary | ICD-10-CM | POA: Diagnosis present

## 2017-05-31 DIAGNOSIS — G4733 Obstructive sleep apnea (adult) (pediatric): Secondary | ICD-10-CM | POA: Diagnosis present

## 2017-05-31 DIAGNOSIS — Z7952 Long term (current) use of systemic steroids: Secondary | ICD-10-CM

## 2017-05-31 DIAGNOSIS — F419 Anxiety disorder, unspecified: Secondary | ICD-10-CM | POA: Diagnosis present

## 2017-05-31 DIAGNOSIS — Z6841 Body Mass Index (BMI) 40.0 and over, adult: Secondary | ICD-10-CM

## 2017-05-31 LAB — I-STAT TROPONIN, ED: Troponin i, poc: 0.03 ng/mL (ref 0.00–0.08)

## 2017-05-31 MED ORDER — NITROGLYCERIN 0.4 MG SL SUBL
0.4000 mg | SUBLINGUAL_TABLET | SUBLINGUAL | Status: AC | PRN
  Administered 2017-05-31 (×3): 0.4 mg via SUBLINGUAL
  Filled 2017-05-31 (×3): qty 1

## 2017-05-31 MED ORDER — ASPIRIN 81 MG PO CHEW
324.0000 mg | CHEWABLE_TABLET | Freq: Once | ORAL | Status: AC
  Administered 2017-05-31: 324 mg via ORAL
  Filled 2017-05-31: qty 4

## 2017-05-31 MED ORDER — MORPHINE SULFATE (PF) 4 MG/ML IV SOLN
4.0000 mg | Freq: Once | INTRAVENOUS | Status: AC
  Administered 2017-05-31: 4 mg via INTRAVENOUS
  Filled 2017-05-31: qty 1

## 2017-05-31 NOTE — ED Triage Notes (Signed)
Pt reports L sided chest pain that started about 1.5 hours ago. Pain feels like pressure and he endorses nausea and radiation down his L arm. No SOB, but pt looks clammy. A&Ox4. Pt appears to be in distress in triage.
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
## 2017-06-01 ENCOUNTER — Other Ambulatory Visit: Payer: Self-pay

## 2017-06-01 ENCOUNTER — Encounter (HOSPITAL_COMMUNITY): Payer: Self-pay

## 2017-06-01 ENCOUNTER — Emergency Department (HOSPITAL_COMMUNITY): Payer: Self-pay

## 2017-06-01 DIAGNOSIS — I160 Hypertensive urgency: Secondary | ICD-10-CM | POA: Diagnosis present

## 2017-06-01 DIAGNOSIS — I10 Essential (primary) hypertension: Secondary | ICD-10-CM

## 2017-06-01 DIAGNOSIS — N183 Chronic kidney disease, stage 3 (moderate): Secondary | ICD-10-CM

## 2017-06-01 DIAGNOSIS — E119 Type 2 diabetes mellitus without complications: Secondary | ICD-10-CM

## 2017-06-01 DIAGNOSIS — R079 Chest pain, unspecified: Secondary | ICD-10-CM

## 2017-06-01 LAB — CBC
HCT: 29.6 % — ABNORMAL LOW (ref 39.0–52.0)
HCT: 33.1 % — ABNORMAL LOW (ref 39.0–52.0)
Hemoglobin: 10.6 g/dL — ABNORMAL LOW (ref 13.0–17.0)
Hemoglobin: 11.4 g/dL — ABNORMAL LOW (ref 13.0–17.0)
MCH: 28.2 pg (ref 26.0–34.0)
MCH: 27.5 pg (ref 26.0–34.0)
MCHC: 35.8 g/dL (ref 30.0–36.0)
MCHC: 34.4 g/dL (ref 30.0–36.0)
MCV: 78.7 fL (ref 78.0–100.0)
MCV: 80 fL (ref 78.0–100.0)
Platelets: 281 10*3/uL (ref 150–400)
Platelets: 219 10*3/uL (ref 150–400)
RBC: 3.76 MIL/uL — ABNORMAL LOW (ref 4.22–5.81)
RBC: 4.14 MIL/uL — ABNORMAL LOW (ref 4.22–5.81)
RDW: 14.4 % (ref 11.5–15.5)
RDW: 14.5 % (ref 11.5–15.5)
WBC: 15.9 10*3/uL — ABNORMAL HIGH (ref 4.0–10.5)
WBC: 11.2 10*3/uL — ABNORMAL HIGH (ref 4.0–10.5)

## 2017-06-01 LAB — IRON AND TIBC
Iron: 66 ug/dL (ref 45–182)
Saturation Ratios: 20 % (ref 17.9–39.5)
TIBC: 322 ug/dL (ref 250–450)
UIBC: 256 ug/dL

## 2017-06-01 LAB — RETICULOCYTES
RBC.: 4.24 MIL/uL (ref 4.22–5.81)
Retic Count, Absolute: 67.8 10*3/uL (ref 19.0–186.0)
Retic Ct Pct: 1.6 % (ref 0.4–3.1)

## 2017-06-01 LAB — PROTIME-INR
INR: 0.96
Prothrombin Time: 12.7 seconds (ref 11.4–15.2)

## 2017-06-01 LAB — BASIC METABOLIC PANEL
Anion gap: 11 (ref 5–15)
BUN: 26 mg/dL — ABNORMAL HIGH (ref 6–20)
CO2: 26 mmol/L (ref 22–32)
Calcium: 8.9 mg/dL (ref 8.9–10.3)
Chloride: 95 mmol/L — ABNORMAL LOW (ref 101–111)
Creatinine, Ser: 1.36 mg/dL — ABNORMAL HIGH (ref 0.61–1.24)
GFR calc Af Amer: >60 mL/min (ref >60)
GFR calc non Af Amer: 58 mL/min — ABNORMAL LOW (ref >60)
Glucose, Bld: 389 mg/dL — ABNORMAL HIGH (ref 65–99)
Potassium: 3.2 mmol/L — ABNORMAL LOW (ref 3.5–5.1)
Sodium: 132 mmol/L — ABNORMAL LOW (ref 135–145)

## 2017-06-01 LAB — APTT: aPTT: 28 seconds (ref 24–36)

## 2017-06-01 LAB — GLUCOSE, CAPILLARY
Glucose-Capillary: 293 mg/dL — ABNORMAL HIGH (ref 65–99)
Glucose-Capillary: 243 mg/dL — ABNORMAL HIGH (ref 65–99)
Glucose-Capillary: 272 mg/dL — ABNORMAL HIGH (ref 65–99)
Glucose-Capillary: 281 mg/dL — ABNORMAL HIGH (ref 65–99)
Glucose-Capillary: 322 mg/dL — ABNORMAL HIGH (ref 65–99)

## 2017-06-01 LAB — I-STAT TROPONIN, ED: Troponin i, poc: 0.03 ng/mL (ref 0.00–0.08)

## 2017-06-01 LAB — LIPID PANEL
Cholesterol: 214 mg/dL — ABNORMAL HIGH (ref 0–200)
HDL: 33 mg/dL — ABNORMAL LOW (ref >40)
LDL Cholesterol: 102 mg/dL — ABNORMAL HIGH (ref 0–99)
Total CHOL/HDL Ratio: 6.5 RATIO
Triglycerides: 397 mg/dL — ABNORMAL HIGH (ref <150)
VLDL: 79 mg/dL — ABNORMAL HIGH (ref 0–40)

## 2017-06-01 LAB — FOLATE: Folate: 17.3 ng/mL (ref >5.9)

## 2017-06-01 LAB — MRSA PCR SCREENING: MRSA by PCR: POSITIVE — AB

## 2017-06-01 LAB — D-DIMER, QUANTITATIVE (NOT AT ARMC): D-Dimer, Quant: 0.38 ug/mL-FEU (ref 0.00–0.50)

## 2017-06-01 LAB — HEPARIN LEVEL (UNFRACTIONATED)
Heparin Unfractionated: <0.1 IU/mL — ABNORMAL LOW (ref 0.30–0.70)
Heparin Unfractionated: <0.1 IU/mL — ABNORMAL LOW (ref 0.30–0.70)

## 2017-06-01 LAB — TROPONIN I: Troponin I: 0.04 ng/mL (ref <0.03)

## 2017-06-01 LAB — FERRITIN: Ferritin: 170 ng/mL (ref 24–336)

## 2017-06-01 LAB — VITAMIN B12: Vitamin B-12: 283 pg/mL (ref 180–914)

## 2017-06-01 LAB — CBG MONITORING, ED: Glucose-Capillary: 257 mg/dL — ABNORMAL HIGH (ref 65–99)

## 2017-06-01 MED ORDER — AMLODIPINE BESYLATE 5 MG PO TABS: 5.0000 mg | ORAL_TABLET | Freq: Every day | ORAL | Status: DC

## 2017-06-01 MED ORDER — CLONAZEPAM 0.5 MG PO TABS
2.0000 mg | ORAL_TABLET | Freq: Two times a day (BID) | ORAL | Status: DC | PRN
  Administered 2017-06-01: 2 mg via ORAL
  Administered 2017-06-02: 1 mg via ORAL
  Administered 2017-06-02: 2 mg via ORAL
  Filled 2017-06-01: qty 4
  Filled 2017-06-01 (×2): qty 2

## 2017-06-01 MED ORDER — NITROGLYCERIN IN D5W 200-5 MCG/ML-% IV SOLN
0.0000 ug/min | INTRAVENOUS | Status: DC
  Administered 2017-06-01: 50 ug/min via INTRAVENOUS
  Filled 2017-06-01: qty 250

## 2017-06-01 MED ORDER — IOPAMIDOL (ISOVUE-370) INJECTION 76%
INTRAVENOUS | Status: AC
  Administered 2017-06-01: 100 mL via INTRAVENOUS
  Filled 2017-06-01: qty 100

## 2017-06-01 MED ORDER — ATORVASTATIN CALCIUM 80 MG PO TABS
80.0000 mg | ORAL_TABLET | Freq: Every day | ORAL | Status: DC
  Administered 2017-06-01 – 2017-06-02 (×2): 80 mg via ORAL
  Filled 2017-06-01 (×2): qty 1

## 2017-06-01 MED ORDER — SODIUM CHLORIDE 0.9 % IJ SOLN
INTRAMUSCULAR | Status: AC
  Filled 2017-06-01: qty 100

## 2017-06-01 MED ORDER — ALLOPURINOL 300 MG PO TABS
300.0000 mg | ORAL_TABLET | Freq: Every morning | ORAL | Status: DC
  Administered 2017-06-01 – 2017-06-03 (×3): 300 mg via ORAL
  Filled 2017-06-01 (×3): qty 1

## 2017-06-01 MED ORDER — BENAZEPRIL HCL 20 MG PO TABS: 10.0000 mg | ORAL_TABLET | Freq: Every day | ORAL | Status: DC

## 2017-06-01 MED ORDER — HEPARIN BOLUS VIA INFUSION
4000.0000 [IU] | Freq: Once | INTRAVENOUS | Status: AC
  Administered 2017-06-01: 4000 [IU] via INTRAVENOUS
  Filled 2017-06-01: qty 4000

## 2017-06-01 MED ORDER — ASPIRIN EC 81 MG PO TBEC
81.0000 mg | DELAYED_RELEASE_TABLET | Freq: Every morning | ORAL | Status: DC
  Administered 2017-06-01 – 2017-06-03 (×3): 81 mg via ORAL
  Filled 2017-06-01 (×3): qty 1

## 2017-06-01 MED ORDER — HEPARIN BOLUS VIA INFUSION
2000.0000 [IU] | Freq: Once | INTRAVENOUS | Status: AC
  Administered 2017-06-01: 2000 [IU] via INTRAVENOUS
  Filled 2017-06-01: qty 2000

## 2017-06-01 MED ORDER — HEPARIN BOLUS VIA INFUSION
2000.0000 [IU] | Freq: Once | INTRAVENOUS | Status: AC
Start: 2017-06-01 — End: 2017-06-01
  Administered 2017-06-01: 2000 [IU] via INTRAVENOUS
  Filled 2017-06-01: qty 2000

## 2017-06-01 MED ORDER — AMLODIPINE BESY-BENAZEPRIL HCL 5-10 MG PO CAPS: 1.0000 | ORAL_CAPSULE | Freq: Every morning | ORAL | Status: DC

## 2017-06-01 MED ORDER — MUPIROCIN 2 % EX OINT
1.0000 "application " | TOPICAL_OINTMENT | Freq: Two times a day (BID) | CUTANEOUS | Status: DC
  Administered 2017-06-01 – 2017-06-03 (×5): 1 via NASAL
  Filled 2017-06-01 (×3): qty 22

## 2017-06-01 MED ORDER — ROPINIROLE HCL 1 MG PO TABS
4.0000 mg | ORAL_TABLET | Freq: Every day | ORAL | Status: DC
  Administered 2017-06-01 – 2017-06-02 (×2): 4 mg via ORAL
  Filled 2017-06-01 (×4): qty 4

## 2017-06-01 MED ORDER — ROPINIROLE HCL 1 MG PO TABS
4.0000 mg | ORAL_TABLET | Freq: Every day | ORAL | Status: DC
  Administered 2017-06-01 – 2017-06-02 (×2): 4 mg via ORAL
  Filled 2017-06-01: qty 4

## 2017-06-01 MED ORDER — CHLORHEXIDINE GLUCONATE CLOTH 2 % EX PADS
6.0000 | MEDICATED_PAD | Freq: Every day | CUTANEOUS | Status: DC
  Administered 2017-06-02 – 2017-06-03 (×2): 6 via TOPICAL

## 2017-06-01 MED ORDER — NITROGLYCERIN IN D5W 200-5 MCG/ML-% IV SOLN
0.0000 ug/min | Freq: Once | INTRAVENOUS | Status: AC
  Administered 2017-06-01: 5 ug/min via INTRAVENOUS
  Filled 2017-06-01: qty 250

## 2017-06-01 MED ORDER — LORAZEPAM 2 MG/ML IJ SOLN
1.0000 mg | Freq: Once | INTRAMUSCULAR | Status: AC
  Administered 2017-06-01: 1 mg via INTRAVENOUS
  Filled 2017-06-01: qty 1

## 2017-06-01 MED ORDER — ROPINIROLE HCL 1 MG PO TABS
8.0000 mg | ORAL_TABLET | Freq: Every day | ORAL | Status: DC
  Administered 2017-06-01: 8 mg via ORAL
  Filled 2017-06-01: qty 8

## 2017-06-01 MED ORDER — INSULIN ASPART 100 UNIT/ML ~~LOC~~ SOLN
0.0000 [IU] | SUBCUTANEOUS | Status: DC
  Administered 2017-06-01 (×2): 5 [IU] via SUBCUTANEOUS
  Administered 2017-06-01: 3 [IU] via SUBCUTANEOUS
  Administered 2017-06-01 – 2017-06-02 (×3): 5 [IU] via SUBCUTANEOUS
  Administered 2017-06-02: 7 [IU] via SUBCUTANEOUS
  Filled 2017-06-01: qty 1

## 2017-06-01 MED ORDER — HEPARIN (PORCINE) IN NACL 100-0.45 UNIT/ML-% IJ SOLN
2550.0000 [IU]/h | INTRAMUSCULAR | Status: DC
  Administered 2017-06-01: 1200 [IU]/h via INTRAVENOUS
  Filled 2017-06-01 (×3): qty 250

## 2017-06-01 MED ORDER — ROPINIROLE HCL 1 MG PO TABS: 4.0000 mg | ORAL_TABLET | Freq: Every day | ORAL | Status: DC

## 2017-06-01 MED ORDER — CARVEDILOL 3.125 MG PO TABS
3.1250 mg | ORAL_TABLET | Freq: Two times a day (BID) | ORAL | Status: DC
  Administered 2017-06-01 – 2017-06-02 (×3): 3.125 mg via ORAL
  Filled 2017-06-01 (×3): qty 1

## 2017-06-01 MED ORDER — ACETAMINOPHEN 325 MG PO TABS
650.0000 mg | ORAL_TABLET | ORAL | Status: DC | PRN
  Administered 2017-06-01 (×2): 650 mg via ORAL
  Filled 2017-06-01 (×2): qty 2

## 2017-06-01 MED ORDER — MORPHINE SULFATE (PF) 4 MG/ML IV SOLN
4.0000 mg | Freq: Once | INTRAVENOUS | Status: AC
  Administered 2017-06-01: 4 mg via INTRAVENOUS
  Filled 2017-06-01: qty 1

## 2017-06-01 MED ORDER — MORPHINE SULFATE (PF) 2 MG/ML IV SOLN: 2.0000 mg | INTRAVENOUS | Status: DC | PRN

## 2017-06-01 MED ORDER — IOPAMIDOL (ISOVUE-370) INJECTION 76%
100.0000 mL | Freq: Once | INTRAVENOUS | Status: AC | PRN
  Administered 2017-06-01: 100 mL via INTRAVENOUS

## 2017-06-01 MED ORDER — ONDANSETRON HCL 4 MG/2ML IJ SOLN
4.0000 mg | Freq: Four times a day (QID) | INTRAMUSCULAR | Status: DC | PRN
  Administered 2017-06-01: 4 mg via INTRAVENOUS
  Filled 2017-06-01: qty 2
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
## 2017-06-01 NOTE — Progress Notes (Signed)
Inpatient Diabetes Program Recommendations  AACE/ADA: New Consensus Statement on Inpatient Glycemic Control (2015)  Target Ranges:  Prepandial:   less than 140 mg/dL      Peak postprandial:   less than 180 mg/dL (1-2 hours)      Critically ill patients:  140 - 180 mg/dL   Lab Results  Component Value Date   GLUCAP 293 (H) 06/01/2017    Review of Glycemic Control Results for MARQUELL, SAENZ (MRN [id number]) as of 06/01/2017 09:02  Ref. Range 06/01/2017 03:58 06/01/2017 08:16  Glucose-Capillary Latest Ref Range: 65 - 99 mg/dL 257 (H) 293 (H)   Diabetes history: DM2 Outpatient Diabetes medications: None listed Current orders for Inpatient glycemic control: Novolog sensitive correction q 4 hrs.  Inpatient Diabetes Program Recommendations:   -Noted pending A1c. Creatinine 1.36. -Lantus 22 units qd (0.15 units/kg x 146.6 kg)  Thank you, Bethena Roys E. Kieryn Burtis, RN, MSN, CDE  Diabetes Coordinator Inpatient Glycemic Control Team Team Pager [phone] (8am-5pm) 06/01/2017 9:18 AM

## 2017-06-01 NOTE — Progress Notes (Signed)
[place] for IV heparin Indication: chest pain/ACS  No Known Allergies  Patient Measurements: Height: 5\' 9"  (175.3 cm) Weight: (!) 323 lb 3.1 oz (146.6 kg) IBW/kg (Calculated) : 70.7Wt=140 kg Heparin Dosing Weight: 105 kg  Vital Signs: Temp: 99.3 F (37.4 C) (02/11 1935) Temp Source: Axillary (02/11 1935) BP: 158/88 (02/11 1935) Pulse Rate: 103 (02/11 1935)  Labs: Recent Labs    05/31/17 2326 06/01/17 0218 06/01/17 0249 06/01/17 1214 06/01/17 1956  HGB 10.6*  --   --  11.4*  --   HCT 29.6*  --   --  33.1*  --   PLT 281  --   --  219  --   APTT  --  28  --   --   --   LABPROT  --  12.7  --   --   --   INR  --  0.96  --   --   --   HEPARINUNFRC  --   --   --  <0.10* <0.10*  CREATININE 1.36*  --   --   --   --   TROPONINI  --   --  0.04*  --   --     Estimated Creatinine Clearance: 89.8 mL/min (A) (by C-G formula based on SCr of 1.36 mg/dL (H)).   Assessment: 36 yoM c/o CP, continuing on IV heparin for ACS. Not on anticoagulation PTA. recheck heparin level is still undetectable after significant rate increase this morning. no bleed or issues with the drip per discussion with RN.   Goal of Therapy:  Heparin level 0.3-0.7 units/ml Monitor platelets by anticoagulation protocol: Yes   Plan:  Heparin 2000 unit bolus x 1 Increase heparin drip to 1900 units/hr F/u AM labs Monitor daily heparin level and CBC, s/sx bleeding Cath planned for 2/12  Maryanna Shape, PharmD, BCPS  Clinical Pharmacist  Pager: [phone]   06/01/2017 9:01 PM
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
## 2017-06-01 NOTE — Progress Notes (Signed)
PROGRESS NOTE    Justin Hall  XFG:[id number] DOB: 12-31-1965 DOA: 05/31/2017 PCP: Justin Bill, MD   Chief Complaint  Patient presents with  . Chest Pain  . Nausea    Brief Narrative:  HPI on 06/01/2017 by Dr. Jennette Kettle Justin Hall is a 52 y.o. male with medical history significant of HTN, DM2, obesity.  Patient presents to the ED with c/o L sided CP onset around 2100 last evening.  Severe, persistent.  Radiates to L arm.  Has been without requip and klonapin for past 1 week.  Assessment & Plan   Admitted earlier today by Dr. Jennette Kettle. See H&P.   Chest pain -Troponin 0 0.04 -Patient with multiple risk factors for  heart disease including obesity, hypertension, diabetes -CTA chest negative for dissection -Continue to monitor troponins -Cardiology consulted and appreciated -Placed on nitroglycerin as well as heparin drips -Lipid panel pending -Continue aspirin, statin  Essential hypertension -Blood pressure more controlled -Started on Coreg -ACE inhibitor held  Diabetes mellitus, type II -Continue insulin sliding scale CBG monitoring  -hemoglobin A1c pending  Chronic kidney disease, stage III -Last creatinine in June 2018 was 1.41 -Creatinine currently 1.36 -Continue to monitor BMP  Leukocytosis -Suspect reactive vs viral infection (patient with mild nasal congestion and cough for the last 3 weeks) -Chest x-ray and UA unremarkable for infection -CTA chest unremarkable for pneumonia -Continue to monitor CBC  Microcytic anemia -Hemoglobin currently 10.6, October 12, 2016, hemoglobin 12.7 -Possibly dilutional -Continue to monitor CBC, will obtain anemia panel  Morbid obesity -BMI 47.73 -Will need to discuss lifestyle modifications and other interventions as an outpatient with primary care physician.  Obstructive sleep apnea -Continue CPAP QHS  DVT Prophylaxis heparin  Code Status: Full  Family Communication: None at bedside  Disposition  Plan:  observation, pending cardiology recommendations and workup  Consultants Cardiology  Procedures  None  Antibiotics   Anti-infectives (From admission, onward)   None      Subjective:   Justin Hall seen and examined today.  Continues to feel chest pressure, states pain is like no pain he is experienced before and has been constant.  Denies current shortness of breath, abdominal pain, nausea vomiting, diarrhea or constipation  Objective:   Vitals:   06/01/17 0600 06/01/17 0615 06/01/17 0813 06/01/17 0927  BP: (!) 151/99 (!) 122/111 122/68 (!) 161/88  Pulse: 91 91 97 97  Resp: 15 14 (!) 21   Temp:   (!) 97.3 F (36.3 C)   TempSrc:   Oral   SpO2: (!) 87% (!) 87% 95%   Weight:      Height:        Intake/Output Summary (Last 24 hours) at 06/01/2017 9678 Last data filed at 06/01/2017 0600 Gross per 24 hour  Intake 59.48 ml  Output -  Net 59.48 ml   Filed Weights   06/01/17 0530  Weight: (!) 146.6 kg (323 lb 3.1 oz)    Exam  General: Well developed, well nourished, NAD, appears stated age  HEENT: NCAT, mucous membranes moist.   Neck: Supple, no JVD, no masses  Cardiovascular: Distant heart sounds, S1 S2 auscultated, no rubs, murmurs or gallops. Regular rate and rhythm.  Respiratory: Clear to auscultation bilaterally with equal chest rise  Abdomen: Soft, obese, nontender, nondistended, + bowel sounds  Extremities: warm dry without cyanosis clubbing. Trace LE edema.  Neuro: AAOx3, nonfocal  Psych: anxious   Data Reviewed: I have personally reviewed following labs and imaging studies  CBC: Recent Labs  Lab 05/31/17 2326  WBC 15.9*  HGB 10.6*  HCT 29.6*  MCV 78.7  PLT 856   Basic Metabolic Panel: Recent Labs  Lab 05/31/17 2326  NA 132*  K 3.2*  CL 95*  CO2 26  GLUCOSE 389*  BUN 26*  CREATININE 1.36*  CALCIUM 8.9   GFR: Estimated Creatinine Clearance: 89.8 mL/min (A) (by C-G formula based on SCr of 1.36 mg/dL (H)). Liver Function  Tests: No results for input(s): AST, ALT, ALKPHOS, BILITOT, PROT, ALBUMIN in the last 168 hours. No results for input(s): LIPASE, AMYLASE in the last 168 hours. No results for input(s): AMMONIA in the last 168 hours. Coagulation Profile: Recent Labs  Lab 06/01/17 0218  INR 0.96   Cardiac Enzymes: Recent Labs  Lab 06/01/17 0249  TROPONINI 0.04*   BNP (last 3 results) No results for input(s): PROBNP in the last 8760 hours. HbA1C: No results for input(s): HGBA1C in the last 72 hours. CBG: Recent Labs  Lab 06/01/17 0358 06/01/17 0816  GLUCAP 257* 293*   Lipid Profile: No results for input(s): CHOL, HDL, LDLCALC, TRIG, CHOLHDL, LDLDIRECT in the last 72 hours. Thyroid Function Tests: No results for input(s): TSH, T4TOTAL, FREET4, T3FREE, THYROIDAB in the last 72 hours. Anemia Panel: No results for input(s): VITAMINB12, FOLATE, FERRITIN, TIBC, IRON, RETICCTPCT in the last 72 hours. Urine analysis:    Component Value Date/Time   COLORURINE STRAW (A) 10/12/2016 2353   APPEARANCEUR CLEAR 10/12/2016 2353   LABSPEC 1.015 10/12/2016 2353   PHURINE 6.0 10/12/2016 2353   GLUCOSEU >=500 (A) 10/12/2016 2353   HGBUR NEGATIVE 10/12/2016 2353   BILIRUBINUR NEGATIVE 10/12/2016 2353   KETONESUR NEGATIVE 10/12/2016 2353   PROTEINUR NEGATIVE 10/12/2016 2353   UROBILINOGEN 0.2 02/21/2014 0800   NITRITE NEGATIVE 10/12/2016 2353   LEUKOCYTESUR NEGATIVE 10/12/2016 2353   Sepsis Labs: @LABRCNTIP (procalcitonin:4,lacticidven:4)  ) Recent Results (from the past 240 hour(s))  MRSA PCR Screening     Status: Abnormal   Collection Time: 06/01/17  5:39 AM  Result Value Ref Range Status   MRSA by PCR POSITIVE (A) NEGATIVE Final    Comment:        The GeneXpert MRSA Assay (FDA approved for NASAL specimens only), is one component of a comprehensive MRSA colonization surveillance program. It is not intended to diagnose MRSA infection nor to guide or monitor treatment for MRSA  infections. RESULT CALLED TO, READ BACK BY AND VERIFIED WITH: RN Iverson Alamin [id number] [phone] MLM Performed at [facility], [place] [address] [place], [place] [address]       Radiology Studies: Dg Chest 2 View  Result Date: 05/31/2017 CLINICAL DATA:  Pt having left side upper chest pains that travel down left arm that started about 90 mins ago. High anxiety, restless leg syndrome, SOB, coughing, pt takes HTN meds, pt is diabetic, nonsmoker, no hx of asthma. EXAM: CHEST  2 VIEW COMPARISON:  10/12/2016 FINDINGS: The heart size and mediastinal contours are within normal limits. Both lungs are clear. No pleural effusion or pneumothorax. The visualized skeletal structures are unremarkable. IMPRESSION: No active cardiopulmonary disease. Electronically Signed   By: Lajean Manes M.D.   On: 05/31/2017 23:11   Ct Angio Chest/abd/pel For Dissection W And/or Wo Contrast  Result Date: 06/01/2017 CLINICAL DATA:  Left-sided chest pain since 2100 hours radiating down left arm. EXAM: CT ANGIOGRAPHY CHEST, ABDOMEN AND PELVIS TECHNIQUE: Multidetector CT imaging through the chest, abdomen and pelvis was performed using the standard protocol during bolus administration of intravenous  contrast. Multiplanar reconstructed images and MIPs were obtained and reviewed to evaluate the vascular anatomy. CONTRAST:  100 cc Isovue 370 IV COMPARISON:  None. FINDINGS: CTA CHEST FINDINGS Cardiovascular: Normal size heart without pericardial effusion. Moderate-to-marked atherosclerotic calcifications of the LAD and left circumflex. No aortic aneurysm or dissection. No acute central pulmonary embolus. Mediastinum/Nodes: No enlarged mediastinal, hilar, or axillary lymph nodes. Hypodense 11 mm right thyroid gland nodule. No thyromegaly. Midline trachea without deviation. Patent trachea and mainstem bronchi. Esophagus is unremarkable. Lungs/Pleura: Subpleural scarring or atelectasis in the right lower lobe. No effusion or pneumothorax. No  dominant mass. Musculoskeletal: Osteophytes are noted along the lower thoracic spine. Degenerative disc disease L5-S1. Review of the MIP images confirms the above findings. CTA ABDOMEN AND PELVIS FINDINGS VASCULAR Aorta: Normal caliber aorta without aneurysm, dissection, vasculitis or significant stenosis. Celiac: Patent without evidence of aneurysm, dissection, vasculitis or significant stenosis. SMA: Patent without evidence of aneurysm, dissection, vasculitis or significant stenosis. Renals: Both renal arteries are patent without evidence of aneurysm, dissection, vasculitis, fibromuscular dysplasia or significant stenosis. IMA: Patent without evidence of aneurysm, dissection, vasculitis or significant stenosis. Inflow: Patent without evidence of aneurysm, dissection, vasculitis or significant stenosis. Veins: No obvious venous abnormality within the limitations of this arterial phase study. Review of the MIP images confirms the above findings. NON-VASCULAR Hepatobiliary: Mild hepatic steatosis. No focal liver abnormality. No gallstones or secondary signs of acute cholecystitis. Pancreas: Normal Spleen: Normal Adrenals/Urinary Tract: Adrenal glands are unremarkable. Kidneys are normal, without renal calculi, focal lesion, or hydronephrosis. Bladder is unremarkable. Stomach/Bowel: Physiologic distention of the stomach with normal small bowel rotation. No bowel obstruction or inflammation. Normal appendix. Scattered colonic diverticulosis without acute diverticulitis. Lymphatic: No lymphadenopathy. Reproductive: The prostate and seminal vesicles are unremarkable. Other: Fat containing periumbilical and bilateral inguinal hernias. Musculoskeletal: Mild bony exostosis of the right iliac crest possibly related to prior bone graft donor site. Degenerative disc disease L5-S1. Review of the MIP images confirms the above findings. IMPRESSION: 1. Coronary arteriosclerosis along the LAD and left circumflex. 2. No aortic  aneurysm or dissection. No acute large central pulmonary embolus. 3. Periumbilical fat containing ventral hernia with fat containing bilateral inguinal hernias. 4. Mild hepatic steatosis. Electronically Signed   By: Ashley Royalty M.D.   On: 06/01/2017 01:56     Scheduled Meds: . allopurinol  300 mg Oral q morning - 10a  . aspirin EC  81 mg Oral q morning - 10a  . atorvastatin  80 mg Oral q1800  . carvedilol  3.125 mg Oral BID WC  . insulin aspart  0-9 Units Subcutaneous Q4H  . rOPINIRole  8 mg Oral QHS  . sodium chloride       Continuous Infusions: . heparin 1,200 Units/hr (06/01/17 0541)  . nitroGLYCERIN 50 mcg/min (06/01/17 0541)     LOS: 0 days   Time Spent in minutes   30 minutes  Eisen Robenson D.O. on 06/01/2017 at 9:52 AM  Between 7am to 7pm - Pager - [phone]  After 7pm go to [email or web address] - password [email or web address]  And look for the night coverage person covering for me after hours  [facility] Office  [phone]
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
## 2017-06-02 ENCOUNTER — Ambulatory Visit (HOSPITAL_COMMUNITY)
Admission: RE | Admit: 2017-06-02 | Payer: Managed Care, Other (non HMO) | Source: Ambulatory Visit | Admitting: Cardiovascular Disease

## 2017-06-02 ENCOUNTER — Inpatient Hospital Stay (HOSPITAL_COMMUNITY)
Admission: EM | Disposition: A | Discharge status: Home / Self Care | Payer: Self-pay | Source: Home / Self Care | Attending: Internal Medicine

## 2017-06-02 ENCOUNTER — Inpatient Hospital Stay (HOSPITAL_COMMUNITY): Payer: Self-pay

## 2017-06-02 DIAGNOSIS — R072 Precordial pain: Secondary | ICD-10-CM

## 2017-06-02 DIAGNOSIS — G4733 Obstructive sleep apnea (adult) (pediatric): Secondary | ICD-10-CM

## 2017-06-02 DIAGNOSIS — E782 Mixed hyperlipidemia: Secondary | ICD-10-CM

## 2017-06-02 DIAGNOSIS — I200 Unstable angina: Secondary | ICD-10-CM

## 2017-06-02 DIAGNOSIS — R079 Chest pain, unspecified: Secondary | ICD-10-CM

## 2017-06-02 HISTORY — PX: LEFT HEART CATH AND CORONARY ANGIOGRAPHY: CATH118249

## 2017-06-02 LAB — ECHOCARDIOGRAM COMPLETE
E decel time: 143 msec
E/e' ratio: 13.42
FS: 32 % (ref 28–44)
Height: 69 in
IVS/LV PW RATIO, ED: 0.99
LA ID, A-P, ES: 36 mm
LA diam end sys: 36 mm
LA diam index: 1.42 cm/m2
LA vol A4C: 51.3 ml
LA vol index: 19.8 mL/m2
LA vol: 50 mL
LV E/e' medial: 13.42
LV E/e'average: 13.42
LV PW d: 14.7 mm — AB (ref 0.6–1.1)
LV e' LATERAL: 8.05 cm/s
LVOT SV: 72 mL
LVOT VTI: 20.9 cm
LVOT area: 3.46 cm2
LVOT diameter: 21 mm
LVOT peak grad rest: 6 mmHg
LVOT peak vel: 119 cm/s
Lateral S' vel: 15.1 cm/s
MV Dec: 143
MV Peak grad: 5 mmHg
MV pk A vel: 90.3 m/s
MV pk E vel: 108 m/s
TAPSE: 23.9 mm
TDI e' lateral: 8.05
TDI e' medial: 6.09
Weight: 5142.89 oz

## 2017-06-02 LAB — BASIC METABOLIC PANEL
Anion gap: 12 (ref 5–15)
BUN: 18 mg/dL (ref 6–20)
CO2: 25 mmol/L (ref 22–32)
Calcium: 8.6 mg/dL — ABNORMAL LOW (ref 8.9–10.3)
Chloride: 99 mmol/L — ABNORMAL LOW (ref 101–111)
Creatinine, Ser: 1.2 mg/dL (ref 0.61–1.24)
GFR calc Af Amer: >60 mL/min (ref >60)
GFR calc non Af Amer: >60 mL/min (ref >60)
Glucose, Bld: 255 mg/dL — ABNORMAL HIGH (ref 65–99)
Potassium: 3.5 mmol/L (ref 3.5–5.1)
Sodium: 136 mmol/L (ref 135–145)

## 2017-06-02 LAB — CBC
HCT: 33.1 % — ABNORMAL LOW (ref 39.0–52.0)
Hemoglobin: 11.2 g/dL — ABNORMAL LOW (ref 13.0–17.0)
MCH: 27.2 pg (ref 26.0–34.0)
MCHC: 33.8 g/dL (ref 30.0–36.0)
MCV: 80.3 fL (ref 78.0–100.0)
Platelets: 204 10*3/uL (ref 150–400)
RBC: 4.12 MIL/uL — ABNORMAL LOW (ref 4.22–5.81)
RDW: 14.5 % (ref 11.5–15.5)
WBC: 15.1 10*3/uL — ABNORMAL HIGH (ref 4.0–10.5)

## 2017-06-02 LAB — GLUCOSE, CAPILLARY
Glucose-Capillary: 265 mg/dL — ABNORMAL HIGH (ref 65–99)
Glucose-Capillary: 243 mg/dL — ABNORMAL HIGH (ref 65–99)
Glucose-Capillary: 194 mg/dL — ABNORMAL HIGH (ref 65–99)
Glucose-Capillary: 243 mg/dL — ABNORMAL HIGH (ref 65–99)

## 2017-06-02 LAB — HEPARIN LEVEL (UNFRACTIONATED)
Heparin Unfractionated: <0.1 IU/mL — ABNORMAL LOW (ref 0.30–0.70)
Heparin Unfractionated: <0.1 IU/mL — ABNORMAL LOW (ref 0.30–0.70)

## 2017-06-02 LAB — HEMOGLOBIN A1C
Hgb A1c MFr Bld: 10.5 % — ABNORMAL HIGH (ref 4.8–5.6)
Mean Plasma Glucose: 254.65 mg/dL

## 2017-06-02 SURGERY — LEFT HEART CATH AND CORONARY ANGIOGRAPHY
Anesthesia: LOCAL

## 2017-06-02 MED ORDER — HEPARIN (PORCINE) IN NACL 2-0.9 UNIT/ML-% IJ SOLN
INTRAMUSCULAR | Status: AC
  Filled 2017-06-02: qty 1000

## 2017-06-02 MED ORDER — VERAPAMIL HCL 2.5 MG/ML IV SOLN
INTRAVENOUS | Status: DC | PRN
  Administered 2017-06-02: 16:00:00 via INTRA_ARTERIAL

## 2017-06-02 MED ORDER — MIDAZOLAM HCL 2 MG/2ML IJ SOLN
INTRAMUSCULAR | Status: AC
  Filled 2017-06-02: qty 2

## 2017-06-02 MED ORDER — HEPARIN (PORCINE) IN NACL 2-0.9 UNIT/ML-% IJ SOLN
INTRAMUSCULAR | Status: AC | PRN
  Administered 2017-06-02: 1000 mL

## 2017-06-02 MED ORDER — SODIUM CHLORIDE 0.9% FLUSH: 3.0000 mL | INTRAVENOUS | Status: DC | PRN

## 2017-06-02 MED ORDER — PERFLUTREN LIPID MICROSPHERE
INTRAVENOUS | Status: AC
  Administered 2017-06-02: 3 mL
  Filled 2017-06-02: qty 10

## 2017-06-02 MED ORDER — SODIUM CHLORIDE 0.9 % IV SOLN: 250.0000 mL | INTRAVENOUS | Status: DC | PRN

## 2017-06-02 MED ORDER — HEPARIN SODIUM (PORCINE) 1000 UNIT/ML IJ SOLN
INTRAMUSCULAR | Status: AC
  Filled 2017-06-02: qty 1

## 2017-06-02 MED ORDER — ROPINIROLE HCL 1 MG PO TABS
4.0000 mg | ORAL_TABLET | Freq: Once | ORAL | Status: AC
  Administered 2017-06-02: 4 mg via ORAL
  Filled 2017-06-02: qty 4

## 2017-06-02 MED ORDER — SODIUM CHLORIDE 0.9 % WEIGHT BASED INFUSION: 3.0000 mL/kg/h | INTRAVENOUS | Status: DC

## 2017-06-02 MED ORDER — LIVING WELL WITH DIABETES BOOK
Freq: Once | Status: AC
  Administered 2017-06-02: 15:00:00
  Filled 2017-06-02: qty 1

## 2017-06-02 MED ORDER — SODIUM CHLORIDE 0.9% FLUSH: 3.0000 mL | Freq: Two times a day (BID) | INTRAVENOUS | Status: DC

## 2017-06-02 MED ORDER — CLONAZEPAM 1 MG PO TABS
1.0000 mg | ORAL_TABLET | Freq: Once | ORAL | Status: AC | PRN
  Administered 2017-06-02: 1 mg via ORAL
  Filled 2017-06-02: qty 1

## 2017-06-02 MED ORDER — SODIUM CHLORIDE 0.9 % IV SOLN
INTRAVENOUS | Status: AC
  Administered 2017-06-02: 19:00:00 via INTRAVENOUS

## 2017-06-02 MED ORDER — SODIUM CHLORIDE 0.9% FLUSH
3.0000 mL | Freq: Two times a day (BID) | INTRAVENOUS | Status: DC
  Administered 2017-06-02: 3 mL via INTRAVENOUS

## 2017-06-02 MED ORDER — DIAZEPAM 5 MG PO TABS
10.0000 mg | ORAL_TABLET | Freq: Once | ORAL | Status: AC
  Administered 2017-06-02: 10 mg via ORAL
  Filled 2017-06-02: qty 2

## 2017-06-02 MED ORDER — IOPAMIDOL (ISOVUE-370) INJECTION 76%
INTRAVENOUS | Status: AC
  Filled 2017-06-02: qty 50

## 2017-06-02 MED ORDER — INSULIN ASPART 100 UNIT/ML ~~LOC~~ SOLN
0.0000 [IU] | Freq: Every day | SUBCUTANEOUS | Status: DC
  Administered 2017-06-02: 2 [IU] via SUBCUTANEOUS

## 2017-06-02 MED ORDER — MIDAZOLAM HCL 2 MG/2ML IJ SOLN
INTRAMUSCULAR | Status: DC | PRN
  Administered 2017-06-02: 1 mg via INTRAVENOUS

## 2017-06-02 MED ORDER — FENTANYL CITRATE (PF) 100 MCG/2ML IJ SOLN
INTRAMUSCULAR | Status: DC | PRN
  Administered 2017-06-02: 25 ug via INTRAVENOUS

## 2017-06-02 MED ORDER — SODIUM CHLORIDE 0.9 % WEIGHT BASED INFUSION
1.0000 mL/kg/h | INTRAVENOUS | Status: DC
  Administered 2017-06-02: 1 mL/kg/h via INTRAVENOUS

## 2017-06-02 MED ORDER — MORPHINE SULFATE (PF) 4 MG/ML IV SOLN: 2.0000 mg | INTRAVENOUS | Status: DC | PRN

## 2017-06-02 MED ORDER — SODIUM CHLORIDE 0.9 % WEIGHT BASED INFUSION: 1.0000 mL/kg/h | INTRAVENOUS | Status: DC

## 2017-06-02 MED ORDER — INSULIN ASPART 100 UNIT/ML ~~LOC~~ SOLN
0.0000 [IU] | Freq: Three times a day (TID) | SUBCUTANEOUS | Status: DC
  Administered 2017-06-02 (×2): 3 [IU] via SUBCUTANEOUS
  Administered 2017-06-02: 5 [IU] via SUBCUTANEOUS
  Administered 2017-06-03: 8 [IU] via SUBCUTANEOUS
  Administered 2017-06-03: 5 [IU] via SUBCUTANEOUS

## 2017-06-02 MED ORDER — FENTANYL CITRATE (PF) 100 MCG/2ML IJ SOLN
INTRAMUSCULAR | Status: AC
  Filled 2017-06-02: qty 2

## 2017-06-02 MED ORDER — LIDOCAINE HCL (PF) 1 % IJ SOLN
INTRAMUSCULAR | Status: DC | PRN
  Administered 2017-06-02: 2 mL

## 2017-06-02 MED ORDER — VERAPAMIL HCL 2.5 MG/ML IV SOLN
INTRAVENOUS | Status: AC
  Filled 2017-06-02: qty 2

## 2017-06-02 MED ORDER — LIDOCAINE HCL 1 % IJ SOLN
INTRAMUSCULAR | Status: AC
  Filled 2017-06-02: qty 20

## 2017-06-02 MED ORDER — ROPINIROLE HCL 1 MG PO TABS: 4.0000 mg | ORAL_TABLET | Freq: Every day | ORAL | Status: DC

## 2017-06-02 MED ORDER — CARVEDILOL 3.125 MG PO TABS
6.2500 mg | ORAL_TABLET | Freq: Two times a day (BID) | ORAL | Status: DC
  Administered 2017-06-02 – 2017-06-03 (×2): 6.25 mg via ORAL
  Filled 2017-06-02 (×2): qty 2

## 2017-06-02 MED ORDER — HEPARIN SODIUM (PORCINE) 1000 UNIT/ML IJ SOLN
INTRAMUSCULAR | Status: DC | PRN
  Administered 2017-06-02: 6000 [IU] via INTRAVENOUS

## 2017-06-02 SURGICAL SUPPLY — 14 items
CATH INFINITI 5 FR JL3.5 (CATHETERS) ×2 IMPLANT
CATH INFINITI 5FR ANG PIGTAIL (CATHETERS) ×2 IMPLANT
CATH INFINITI JR4 5F (CATHETERS) ×2 IMPLANT
DEVICE RAD COMP TR BAND LRG (VASCULAR PRODUCTS) ×2 IMPLANT
GLIDESHEATH SLEND SS 6F .021 (SHEATH) ×2 IMPLANT
GUIDEWIRE INQWIRE 1.5J.035X260 (WIRE) ×1 IMPLANT
HOVERMATT SINGLE USE (MISCELLANEOUS) ×2 IMPLANT
INQWIRE 1.5J .035X260CM (WIRE) ×2
KIT HEART LEFT (KITS) ×2 IMPLANT
KIT PREMIUM HAND CONTROLLER (KITS) ×2 IMPLANT
KIT SINGLE USE MANIFOLD (KITS) ×2 IMPLANT
PACK CARDIAC CATHETERIZATION (CUSTOM PROCEDURE TRAY) ×2 IMPLANT
TRANSDUCER W/STOPCOCK (MISCELLANEOUS) ×2 IMPLANT
TUBING CIL FLEX 10 FLL-RA (TUBING) ×2 IMPLANT

## 2017-06-02 NOTE — Progress Notes (Signed)
PROGRESS NOTE    Justin [name]  GUR:[id number] DOB: 23-Apr-1965 DOA: 05/31/2017 PCP: Justin Bill, MD   Chief Complaint  Patient presents with  . Chest Pain  . Nausea    Brief Narrative:  HPI on 06/01/2017 by Dr. Jennette Kettle Rasul [name] is a 52 y.o. male with medical history significant of HTN, DM2, obesity.  Patient presents to the ED with c/o L sided CP onset around 2100 last evening.  Severe, persistent.  Radiates to L arm.  Has been without requip and klonapin for past 1 week.  Interim history Patient admitted for chest pain.  Cardiology consulted and appreciated.  Plan for cardiac catheterization later today.  Assessment & Plan   Chest pain/unstable angina -Troponin 0 0.04 -Patient with multiple risk factors for  heart disease including obesity, hypertension, diabetes -CTA chest negative for dissection -Continue to monitor troponins -Cardiology consulted and appreciated-plan for cardiac catheterization later today -Echocardiogram pending -Placed on nitroglycerin as well as heparin drips -Lipid panel: TC 214, HDL 33, LDL 102, triglycerides 397 -Continue aspirin, statin  Essential hypertension -Blood pressure more controlled -Started on Coreg -ACE inhibitor held  Diabetes mellitus, type II -Continue insulin sliding scale CBG monitoring- will change to moderate scale (currently NPO pending cath, will likely need resistant scale) -hemoglobin A1c pending  Chronic kidney disease, stage III -Last creatinine in June 2018 was 1.41 -Creatinine currently 1.20 -Continue to monitor BMP  Leukocytosis -Suspect reactive vs viral infection (patient with mild nasal congestion and cough for the last 3 weeks) -Chest x-ray and UA unremarkable for infection -CTA chest unremarkable for pneumonia -Continue to monitor CBC  Microcytic anemia -Hemoglobin currently 11.2, October 12, 2016, hemoglobin 12.7 -Possibly dilutional -Anemia panel shows-adequate iron and iron stores.   Vitamin B12 283 -Continue to monitor CBC  Morbid obesity -BMI 47.73 -Will need to discuss lifestyle modifications and other interventions as an outpatient with primary care physician.  Obstructive sleep apnea -Continue CPAP QHS  Anxiety -Continue Klonopin as needed twice daily  Restless leg syndrome -Will restart Requip  DVT Prophylaxis heparin  Code Status: Full  Family Communication: None at bedside  Disposition Plan: Admitted.  Pending cardiac catheterization later today.  Consultants Cardiology  Procedures    Antibiotics   Anti-infectives (From admission, onward)   None      Subjective:   Justin [name] seen and examined today.  Continues to have chest pain and discomfort.  Denies current shortness of breath.  Does feel anxious.  Denies current abdominal pain, nausea or vomiting, diarrhea or constipation.  Objective:   Vitals:   06/01/17 2344 06/02/17 0300 06/02/17 0349 06/02/17 0827  BP:   130/73 (!) 155/93  Pulse: (!) 103 97 99 92  Resp:   (!) 22   Temp:   97.8 F (36.6 C)   TempSrc:   Oral   SpO2: 93% (!) 87% 92%   Weight:   (!) 145.8 kg (321 lb 6.9 oz)   Height:        Intake/Output Summary (Last 24 hours) at 06/02/2017 0959 Last data filed at 06/02/2017 0400 Gross per 24 hour  Intake 1110.27 ml  Output 1500 ml  Net -389.73 ml   Filed Weights   06/01/17 0530 06/02/17 0349  Weight: (!) 146.6 kg (323 lb 3.1 oz) (!) 145.8 kg (321 lb 6.9 oz)   Exam  General: Well developed, obese, no apparent distress  HEENT: NCAT, mucous membranes moist.   Cardiovascular: S1 S2 auscultated, RRR, no murmur  Respiratory: Clear to  auscultation bilaterally with equal chest rise, no wheezing  Abdomen: Soft, nontender, nondistended, + bowel sounds  Extremities: warm dry without cyanosis clubbing or edema  Neuro: AAOx3, nonfocal  Psych: anxious  Data Reviewed: I have personally reviewed following labs and imaging studies  CBC: Recent Labs  Lab  05/31/17 2326 06/01/17 1214 06/02/17 0259  WBC 15.9* 11.2* 15.1*  HGB 10.6* 11.4* 11.2*  HCT 29.6* 33.1* 33.1*  MCV 78.7 80.0 80.3  PLT 281 219 527   Basic Metabolic Panel: Recent Labs  Lab 05/31/17 2326 06/02/17 0259  NA 132* 136  K 3.2* 3.5  CL 95* 99*  CO2 26 25  GLUCOSE 389* 255*  BUN 26* 18  CREATININE 1.36* 1.20  CALCIUM 8.9 8.6*   GFR: Estimated Creatinine Clearance: 101.4 mL/min (by C-G formula based on SCr of 1.2 mg/dL). Liver Function Tests: No results for input(s): AST, ALT, ALKPHOS, BILITOT, PROT, ALBUMIN in the last 168 hours. No results for input(s): LIPASE, AMYLASE in the last 168 hours. No results for input(s): AMMONIA in the last 168 hours. Coagulation Profile: Recent Labs  Lab 06/01/17 0218  INR 0.96   Cardiac Enzymes: Recent Labs  Lab 06/01/17 0249  TROPONINI 0.04*   BNP (last 3 results) No results for input(s): PROBNP in the last 8760 hours. HbA1C: No results for input(s): HGBA1C in the last 72 hours. CBG: Recent Labs  Lab 06/01/17 1627 06/01/17 1938 06/01/17 2306 06/02/17 0351 06/02/17 0821  GLUCAP 272* 281* 322* 265* 243*   Lipid Profile: Recent Labs    06/01/17 0929  CHOL 214*  HDL 33*  LDLCALC 102*  TRIG 397*  CHOLHDL 6.5   Thyroid Function Tests: No results for input(s): TSH, T4TOTAL, FREET4, T3FREE, THYROIDAB in the last 72 hours. Anemia Panel: Recent Labs    06/01/17 1119  VITAMINB12 283  FOLATE 17.3  FERRITIN 170  TIBC 322  IRON 66  RETICCTPCT 1.6   Urine analysis:    Component Value Date/Time   COLORURINE STRAW (A) 10/12/2016 2353   APPEARANCEUR CLEAR 10/12/2016 2353   LABSPEC 1.015 10/12/2016 2353   PHURINE 6.0 10/12/2016 2353   GLUCOSEU >=500 (A) 10/12/2016 2353   HGBUR NEGATIVE 10/12/2016 2353   BILIRUBINUR NEGATIVE 10/12/2016 2353   KETONESUR NEGATIVE 10/12/2016 2353   PROTEINUR NEGATIVE 10/12/2016 2353   UROBILINOGEN 0.2 02/21/2014 0800   NITRITE NEGATIVE 10/12/2016 2353   LEUKOCYTESUR  NEGATIVE 10/12/2016 2353   Sepsis Labs: @LABRCNTIP (procalcitonin:4,lacticidven:4)  ) Recent Results (from the past 240 hour(s))  MRSA PCR Screening     Status: Abnormal   Collection Time: 06/01/17  5:39 AM  Result Value Ref Range Status   MRSA by PCR POSITIVE (A) NEGATIVE Final    Comment:        The GeneXpert MRSA Assay (FDA approved for NASAL specimens only), is one component of a comprehensive MRSA colonization surveillance program. It is not intended to diagnose MRSA infection nor to guide or monitor treatment for MRSA infections. RESULT CALLED TO, READ BACK BY AND VERIFIED WITH: RN Iverson Alamin [id number] MLM Performed at [facility], [place] [address] [place], [place] [address]       Radiology Studies: Dg Chest 2 View  Result Date: 05/31/2017 CLINICAL DATA:  Pt having left side upper chest pains that travel down left arm that started about 90 mins ago. High anxiety, restless leg syndrome, SOB, coughing, pt takes HTN meds, pt is diabetic, nonsmoker, no hx of asthma. EXAM: CHEST  2 VIEW COMPARISON:  10/12/2016  FINDINGS: The heart size and mediastinal contours are within normal limits. Both lungs are clear. No pleural effusion or pneumothorax. The visualized skeletal structures are unremarkable. IMPRESSION: No active cardiopulmonary disease. Electronically Signed   By: Lajean Manes M.D.   On: 05/31/2017 23:11   Ct Angio Chest/abd/pel For Dissection W And/or Wo Contrast  Result Date: 06/01/2017 CLINICAL DATA:  Left-sided chest pain since 2100 hours radiating down left arm. EXAM: CT ANGIOGRAPHY CHEST, ABDOMEN AND PELVIS TECHNIQUE: Multidetector CT imaging through the chest, abdomen and pelvis was performed using the standard protocol during bolus administration of intravenous contrast. Multiplanar reconstructed images and MIPs were obtained and reviewed to evaluate the vascular anatomy. CONTRAST:  100 cc Isovue 370 IV COMPARISON:  None. FINDINGS: CTA CHEST FINDINGS  Cardiovascular: Normal size heart without pericardial effusion. Moderate-to-marked atherosclerotic calcifications of the LAD and left circumflex. No aortic aneurysm or dissection. No acute central pulmonary embolus. Mediastinum/Nodes: No enlarged mediastinal, hilar, or axillary lymph nodes. Hypodense 11 mm right thyroid gland nodule. No thyromegaly. Midline trachea without deviation. Patent trachea and mainstem bronchi. Esophagus is unremarkable. Lungs/Pleura: Subpleural scarring or atelectasis in the right lower lobe. No effusion or pneumothorax. No dominant mass. Musculoskeletal: Osteophytes are noted along the lower thoracic spine. Degenerative disc disease L5-S1. Review of the MIP images confirms the above findings. CTA ABDOMEN AND PELVIS FINDINGS VASCULAR Aorta: Normal caliber aorta without aneurysm, dissection, vasculitis or significant stenosis. Celiac: Patent without evidence of aneurysm, dissection, vasculitis or significant stenosis. SMA: Patent without evidence of aneurysm, dissection, vasculitis or significant stenosis. Renals: Both renal arteries are patent without evidence of aneurysm, dissection, vasculitis, fibromuscular dysplasia or significant stenosis. IMA: Patent without evidence of aneurysm, dissection, vasculitis or significant stenosis. Inflow: Patent without evidence of aneurysm, dissection, vasculitis or significant stenosis. Veins: No obvious venous abnormality within the limitations of this arterial phase study. Review of the MIP images confirms the above findings. NON-VASCULAR Hepatobiliary: Mild hepatic steatosis. No focal liver abnormality. No gallstones or secondary signs of acute cholecystitis. Pancreas: Normal Spleen: Normal Adrenals/Urinary Tract: Adrenal glands are unremarkable. Kidneys are normal, without renal calculi, focal lesion, or hydronephrosis. Bladder is unremarkable. Stomach/Bowel: Physiologic distention of the stomach with normal small bowel rotation. No bowel  obstruction or inflammation. Normal appendix. Scattered colonic diverticulosis without acute diverticulitis. Lymphatic: No lymphadenopathy. Reproductive: The prostate and seminal vesicles are unremarkable. Other: Fat containing periumbilical and bilateral inguinal hernias. Musculoskeletal: Mild bony exostosis of the right iliac crest possibly related to prior bone graft donor site. Degenerative disc disease L5-S1. Review of the MIP images confirms the above findings. IMPRESSION: 1. Coronary arteriosclerosis along the LAD and left circumflex. 2. No aortic aneurysm or dissection. No acute large central pulmonary embolus. 3. Periumbilical fat containing ventral hernia with fat containing bilateral inguinal hernias. 4. Mild hepatic steatosis. Electronically Signed   By: Ashley Royalty M.D.   On: 06/01/2017 01:56     Scheduled Meds: . allopurinol  300 mg Oral q morning - 10a  . aspirin EC  81 mg Oral q morning - 10a  . atorvastatin  80 mg Oral q1800  . carvedilol  6.25 mg Oral BID WC  . Chlorhexidine Gluconate Cloth  6 each Topical Q0600  . insulin aspart  0-15 Units Subcutaneous TID WC  . insulin aspart  0-5 Units Subcutaneous QHS  . mupirocin ointment  1 application Nasal BID  . rOPINIRole  4 mg Oral QHS  . rOPINIRole  4 mg Oral q1800  . sodium chloride flush  3 mL Intravenous  Q12H   Continuous Infusions: . sodium chloride    . sodium chloride 1 mL/kg/hr (06/02/17 0842)  . heparin 2,200 Units/hr (06/02/17 0414)  . nitroGLYCERIN 50 mcg/min (06/02/17 0300)     LOS: 1 day   Time Spent in minutes   30 minutes  Niasia Lanphear D.O. on 06/02/2017 at 9:59 AM  Between 7am to 7pm - Pager - [phone]  After 7pm go to [email or web address] - password [email or web address]  And look for the night coverage person covering for me after hours  [facility] Office  [phone]

## 2017-06-02 NOTE — Progress Notes (Signed)
ANTICOAGULATION CONSULT NOTE  Pharmacy Consult for IV heparin Indication: chest pain/ACS  No Known Allergies  Patient Measurements: Height: 5\' 9"  (175.3 cm) Weight: (!) 321 lb 6.9 oz (145.8 kg) IBW/kg (Calculated) : 70.7Wt=140 kg Heparin Dosing Weight: 105 kg  Vital Signs: Temp: 97.8 F (36.6 C) (02/12 0349) Temp Source: Oral (02/12 0349) BP: 155/93 (02/12 0827) Pulse Rate: 92 (02/12 0827)  Labs: Recent Labs    05/31/17 2326 06/01/17 0218 06/01/17 0249  06/01/17 1214 06/01/17 1956 06/02/17 0259 06/02/17 1125  HGB 10.6*  --   --   --  11.4*  --  11.2*  --   HCT 29.6*  --   --   --  33.1*  --  33.1*  --   PLT 281  --   --   --  219  --  204  --   APTT  --  28  --   --   --   --   --   --   LABPROT  --  12.7  --   --   --   --   --   --   INR  --  0.96  --   --   --   --   --   --   HEPARINUNFRC  --   --   --    < > <0.10* <0.10* <0.10* <0.10*  CREATININE 1.36*  --   --   --   --   --  1.20  --   TROPONINI  --   --  0.04*  --   --   --   --   --    < > = values in this interval not displayed.    Estimated Creatinine Clearance: 101.4 mL/min (by C-G formula based on SCr of 1.2 mg/dL).   Assessment: 70 yoM c/o CP, continuing on IV heparin for ACS. Not on anticoagulation PTA.   Heparin level remains undetectable despite rate increases. CBC ok.  No issues with the drip or bleeding per discussion with RN. Cath scheduled for 2/12.  Goal of Therapy:  Heparin level 0.3-0.7 units/ml Monitor platelets by anticoagulation protocol: Yes   Plan:  Increase heparin drip to 2550 units/hr Daily heparin level and CBC, s/sx bleeding Check 6 hour level vs. cath  Elicia Lamp, PharmD, BCPS Clinical Pharmacist Clinical phone for 06/02/2017 until 3:30pm: [phone] If after 3:30pm, please call main pharmacy at: [phone] 06/02/2017 12:57 PM

## 2017-06-02 NOTE — Progress Notes (Addendum)
Inpatient Diabetes Program Recommendations  AACE/ADA: New Consensus Statement on Inpatient Glycemic Control (2015)  Target Ranges:  Prepandial:   less than 140 mg/dL      Peak postprandial:   less than 180 mg/dL (1-2 hours)      Critically ill patients:  140 - 180 mg/dL   Lab Results  Component Value Date   GLUCAP 243 (H) 06/02/2017   HGBA1C 10.5 (H) 06/02/2017    Review of Glycemic Control Results for [name], Justin (MRN [id number]) as of 06/02/2017 11:33  Ref. Range 06/01/2017 16:27 06/01/2017 19:38 06/01/2017 23:06 06/02/2017 03:51 06/02/2017 08:21  Glucose-Capillary Latest Ref Range: 65 - 99 mg/dL 272 (H) 281 (H) 322 (H) 265 (H) 243 (H)   Inpatient Diabetes Program Recommendations:   Noted A1c just reported @ 10.5. -Lantus 22 units q d Text page to Dr. Mikhail.  Will order starter kit to prepare patient if discharged home on insulin. Nurses, please instruct patient on insulin administration with syringe and allow to administer own injections. Case Management consult regarding insulin needs on discharge. Spoke with patient @ bedside regarding diabetes management. Patient states he does not have insurance @ this time but plans to start insurance March 1st. Reviewed insulin administration with syringe and pen with patient. Patient states his PCP has planned to start on insulin on next visit. Reviewed A1c of 10.5 with patient and gave handout explaining A1c and goal values.  Thank you, Judy E. Hanks, RN, MSN, CDE  Diabetes Coordinator Inpatient Glycemic Control Team Team Pager #[phone] (8am-5pm) 06/02/2017 11:39 AM    

## 2017-06-02 NOTE — Plan of Care (Signed)
Continue with plan of care.  

## 2017-06-02 NOTE — Progress Notes (Signed)
[place] for IV heparin Indication: chest pain/ACS  No Known Allergies  Patient Measurements: Height: 5\' 9"  (175.3 cm) Weight: (!) 321 lb 6.9 oz (145.8 kg) IBW/kg (Calculated) : 70.7Wt=140 kg Heparin Dosing Weight: 105 kg  Vital Signs: Temp: 97.8 F (36.6 C) (02/12 0349) Temp Source: Oral (02/12 0349) BP: 130/73 (02/12 0349) Pulse Rate: 99 (02/12 0349)  Labs: Recent Labs    05/31/17 2326 06/01/17 0218 06/01/17 0249 06/01/17 1214 06/01/17 1956 06/02/17 0259  HGB 10.6*  --   --  11.4*  --  11.2*  HCT 29.6*  --   --  33.1*  --  33.1*  PLT 281  --   --  219  --  204  APTT  --  28  --   --   --   --   LABPROT  --  12.7  --   --   --   --   INR  --  0.96  --   --   --   --   HEPARINUNFRC  --   --   --  <0.10* <0.10* <0.10*  CREATININE 1.36*  --   --   --   --   --   TROPONINI  --   --  0.04*  --   --   --     Estimated Creatinine Clearance: 89.5 mL/min (A) (by C-G formula based on SCr of 1.36 mg/dL (H)).   Assessment: 31 yoM c/o CP, continuing on IV heparin for ACS. Not on anticoagulation PTA.   Heparin level remains undetectable despite rate increases. CBC ok.  No issues with the drip per discussion with RN.   Goal of Therapy:  Heparin level 0.3-0.7 units/ml Monitor platelets by anticoagulation protocol: Yes   Plan:  Increase heparin drip to 2200 units/hr Daily heparin level and CBC, s/sx bleeding Check 6 hour level   Harvel Quale  06/02/2017 4:11 AM

## 2017-06-02 NOTE — Progress Notes (Signed)
  Echocardiogram 2D Echocardiogram has been performed.  Justin [name] 06/02/2017, 10:14 AM

## 2017-06-02 NOTE — Progress Notes (Signed)
Pt having increased anxiety and complains of not being able to sleep. Dr. Was notifed and med was given. Will continue to monitor

## 2017-06-02 NOTE — Interval H&P Note (Signed)
History and Physical Interval Note:  06/02/2017 3:54 PM  Justin [name]  has presented today for cardiac cath with the diagnosis of unstable angina. The various methods of treatment have been discussed with the patient and family. After consideration of risks, benefits and other options for treatment, the patient has consented to  Procedure(s): LEFT HEART CATH AND CORONARY ANGIOGRAPHY (N/A) as a surgical intervention .  The patient's history has been reviewed, patient examined, no change in status, stable for surgery.  I have reviewed the patient's chart and labs.  Questions were answered to the patient's satisfaction.    Cath Lab Visit (complete for each Cath Lab visit)  Clinical Evaluation Leading to the Procedure:   ACS: No.  Non-ACS:    Anginal Classification: CCS III  Anti-ischemic medical therapy: Minimal Therapy (1 class of medications)  Non-Invasive Test Results: No non-invasive testing performed  Prior CABG: No previous CABG        [name]

## 2017-06-02 NOTE — Progress Notes (Signed)
Progress Note  Patient Name: Justin [name] Date of Encounter: 06/02/2017  Primary Cardiologist: No primary care provider on file.   Subjective   But he was very restless last night and did not get much sleep.  Sounds like a combination of restless leg syndrome, anxiety disorder and medication effects.  While he was most agitated he did have chest tightness, but this has now resolved.  He appears quite calm and comfortable.  His nitroglycerin is not running since he has lost that IV during the agitation last night.  Despite that he is angina free.  Discussed details of cath procedure today and he is eager to proceed, although he is very anxious and wishes to be well sedated for the procedure.  Inpatient Medications    Scheduled Meds: . allopurinol  300 mg Oral q morning - 10a  . aspirin EC  81 mg Oral q morning - 10a  . atorvastatin  80 mg Oral q1800  . carvedilol  3.125 mg Oral BID WC  . Chlorhexidine Gluconate Cloth  6 each Topical Q0600  . insulin aspart  0-15 Units Subcutaneous TID WC  . insulin aspart  0-5 Units Subcutaneous QHS  . mupirocin ointment  1 application Nasal BID  . rOPINIRole  4 mg Oral QHS  . rOPINIRole  4 mg Oral q1800  . sodium chloride flush  3 mL Intravenous Q12H   Continuous Infusions: . sodium chloride    . sodium chloride 1 mL/kg/hr (06/02/17 0842)  . heparin 2,200 Units/hr (06/02/17 0414)  . nitroGLYCERIN 50 mcg/min (06/02/17 0300)   PRN Meds: sodium chloride, acetaminophen, clonazePAM, morphine injection, ondansetron (ZOFRAN) IV, sodium chloride flush   Vital Signs    Vitals:   06/01/17 2344 06/02/17 0300 06/02/17 0349 06/02/17 0827  BP:   130/73 (!) 155/93  Pulse: (!) 103 97 99 92  Resp:   (!) 22   Temp:   97.8 F (36.6 C)   TempSrc:   Oral   SpO2: 93% (!) 87% 92%   Weight:   (!) 321 lb 6.9 oz (145.8 kg)   Height:        Intake/Output Summary (Last 24 hours) at 06/02/2017 8119 Last data filed at 06/02/2017 0400 Gross per 24 hour    Intake 1110.27 ml  Output 1500 ml  Net -389.73 ml   Filed Weights   06/01/17 0530 06/02/17 0349  Weight: (!) 323 lb 3.1 oz (146.6 kg) (!) 321 lb 6.9 oz (145.8 kg)    Telemetry    Sinus rhythm/mild sinus tachycardia- Personally Reviewed  ECG    No new tracing- Personally Reviewed  Physical Exam  Obese, limiting the exam GEN: No acute distress.   Neck: No JVD Cardiac: RRR, no murmurs, rubs, or gallops.  Respiratory: Clear to auscultation bilaterally. GI: Soft, nontender, non-distended  MS: No edema; No deformity. Neuro:  Nonfocal  Psych: Normal affect   Labs    Chemistry Recent Labs  Lab 05/31/17 2326 06/02/17 0259  NA 132* 136  K 3.2* 3.5  CL 95* 99*  CO2 26 25  GLUCOSE 389* 255*  BUN 26* 18  CREATININE 1.36* 1.20  CALCIUM 8.9 8.6*  GFRNONAA 58* >60  GFRAA >60 >60  ANIONGAP 11 12     Hematology Recent Labs  Lab 05/31/17 2326 06/01/17 1119 06/01/17 1214 06/02/17 0259  WBC 15.9*  --  11.2* 15.1*  RBC 3.76* 4.24 4.14* 4.12*  HGB 10.6*  --  11.4* 11.2*  HCT 29.6*  --  33.1*  33.1*  MCV 78.7  --  80.0 80.3  MCH 28.2  --  27.5 27.2  MCHC 35.8  --  34.4 33.8  RDW 14.4  --  14.5 14.5  PLT 281  --  219 204    Cardiac Enzymes Recent Labs  Lab 06/01/17 0249  TROPONINI 0.04*    Recent Labs  Lab 05/31/17 2335 06/01/17 0259  TROPIPOC 0.03 0.03     BNPNo results for input(s): BNP, PROBNP in the last 168 hours.   DDimer  Recent Labs  Lab 05/31/17 2326  DDIMER 0.38     Radiology    Dg Chest 2 View  Result Date: 05/31/2017 CLINICAL DATA:  Pt having left side upper chest pains that travel down left arm that started about 90 mins ago. High anxiety, restless leg syndrome, SOB, coughing, pt takes HTN meds, pt is diabetic, nonsmoker, no hx of asthma. EXAM: CHEST  2 VIEW COMPARISON:  10/12/2016 FINDINGS: The heart size and mediastinal contours are within normal limits. Both lungs are clear. No pleural effusion or pneumothorax. The visualized  skeletal structures are unremarkable. IMPRESSION: No active cardiopulmonary disease. Electronically Signed   By: Lajean Manes M.D.   On: 05/31/2017 23:11   Ct Angio Chest/abd/pel For Dissection W And/or Wo Contrast  Result Date: 06/01/2017 CLINICAL DATA:  Left-sided chest pain since 2100 hours radiating down left arm. EXAM: CT ANGIOGRAPHY CHEST, ABDOMEN AND PELVIS TECHNIQUE: Multidetector CT imaging through the chest, abdomen and pelvis was performed using the standard protocol during bolus administration of intravenous contrast. Multiplanar reconstructed images and MIPs were obtained and reviewed to evaluate the vascular anatomy. CONTRAST:  100 cc Isovue 370 IV COMPARISON:  None. FINDINGS: CTA CHEST FINDINGS Cardiovascular: Normal size heart without pericardial effusion. Moderate-to-marked atherosclerotic calcifications of the LAD and left circumflex. No aortic aneurysm or dissection. No acute central pulmonary embolus. Mediastinum/Nodes: No enlarged mediastinal, hilar, or axillary lymph nodes. Hypodense 11 mm right thyroid gland nodule. No thyromegaly. Midline trachea without deviation. Patent trachea and mainstem bronchi. Esophagus is unremarkable. Lungs/Pleura: Subpleural scarring or atelectasis in the right lower lobe. No effusion or pneumothorax. No dominant mass. Musculoskeletal: Osteophytes are noted along the lower thoracic spine. Degenerative disc disease L5-S1. Review of the MIP images confirms the above findings. CTA ABDOMEN AND PELVIS FINDINGS VASCULAR Aorta: Normal caliber aorta without aneurysm, dissection, vasculitis or significant stenosis. Celiac: Patent without evidence of aneurysm, dissection, vasculitis or significant stenosis. SMA: Patent without evidence of aneurysm, dissection, vasculitis or significant stenosis. Renals: Both renal arteries are patent without evidence of aneurysm, dissection, vasculitis, fibromuscular dysplasia or significant stenosis. IMA: Patent without evidence of  aneurysm, dissection, vasculitis or significant stenosis. Inflow: Patent without evidence of aneurysm, dissection, vasculitis or significant stenosis. Veins: No obvious venous abnormality within the limitations of this arterial phase study. Review of the MIP images confirms the above findings. NON-VASCULAR Hepatobiliary: Mild hepatic steatosis. No focal liver abnormality. No gallstones or secondary signs of acute cholecystitis. Pancreas: Normal Spleen: Normal Adrenals/Urinary Tract: Adrenal glands are unremarkable. Kidneys are normal, without renal calculi, focal lesion, or hydronephrosis. Bladder is unremarkable. Stomach/Bowel: Physiologic distention of the stomach with normal small bowel rotation. No bowel obstruction or inflammation. Normal appendix. Scattered colonic diverticulosis without acute diverticulitis. Lymphatic: No lymphadenopathy. Reproductive: The prostate and seminal vesicles are unremarkable. Other: Fat containing periumbilical and bilateral inguinal hernias. Musculoskeletal: Mild bony exostosis of the right iliac crest possibly related to prior bone graft donor site. Degenerative disc disease L5-S1. Review of the MIP images confirms the  above findings. IMPRESSION: 1. Coronary arteriosclerosis along the LAD and left circumflex. 2. No aortic aneurysm or dissection. No acute large central pulmonary embolus. 3. Periumbilical fat containing ventral hernia with fat containing bilateral inguinal hernias. 4. Mild hepatic steatosis. Electronically Signed   By: Ashley Royalty M.D.   On: 06/01/2017 01:56    Cardiac Studies   for cath today  Patient Profile     52 y.o. male with hypertension, type 2 diabetes mellitus, morbid obesity, gout, hyperlipidemia, obstructive sleep apnea on CPAP, anxiety and restless leg syndrome, presents with symptoms very concerning for unstable angina pectoris, ECG changes and borderline cardiac troponin level.  Extensive coronary artery calcification seen on CT  chest. 1. Assessment & Plan    1.  Unstable angina: For coronary angiography and possible percutaneous revascularization today.This procedure has been fully reviewed with the patient and written informed consent has been obtained.  Noninvasive studies are likely to be nondiagnostic due to severe obesity. 2. HTN: Better blood pressure control, not at target.  Increase carvedilol.  Was on an ACE inhibitor prior to admission, on hold until after coronary angiography. 3. CKD 3: He is not only obese but also quite muscular, I think his serum creatinine overestimates the severity of his renal dysfunction.  Creatinine has improved substantially since admission. 4. DM: Poorly controlled, important long-term target. 5.HLP: Hyperlipidemia pattern is strongly consistent with insulin resistance/obesity and poorly controlled diabetes mellitus.  Should be on a statin regardless of findings on catheterization with target LDL less than 70. 6. Gout: Watch for exacerbation intervene promptly in setting of repeated contrast exposure and variations in renal function. 7.  Leukocytosis: Etiology uncertain, no overt pneumonia, bland urinalysis. 8. OSA: Use his own CPAP equipment last night. 9.  Anxiety: Very anxious, will definitely require sedation for angiography.   For questions or updates, please contact [place] Please consult [email or web address] for contact info under Cardiology/STEMI.      Signed, Sanda Klein, MD  06/02/2017, 9:09 AM

## 2017-06-02 NOTE — H&P (View-Only) (Signed)
Progress Note  Patient Name: Justin [name] Date of Encounter: 06/02/2017  Primary Cardiologist: No primary care provider on file.   Subjective   But he was very restless last night and did not get much sleep.  Sounds like a combination of restless leg syndrome, anxiety disorder and medication effects.  While he was most agitated he did have chest tightness, but this has now resolved.  He appears quite calm and comfortable.  His nitroglycerin is not running since he has lost that IV during the agitation last night.  Despite that he is angina free.  Discussed details of cath procedure today and he is eager to proceed, although he is very anxious and wishes to be well sedated for the procedure.  Inpatient Medications    Scheduled Meds: . allopurinol  300 mg Oral q morning - 10a  . aspirin EC  81 mg Oral q morning - 10a  . atorvastatin  80 mg Oral q1800  . carvedilol  3.125 mg Oral BID WC  . Chlorhexidine Gluconate Cloth  6 each Topical Q0600  . insulin aspart  0-15 Units Subcutaneous TID WC  . insulin aspart  0-5 Units Subcutaneous QHS  . mupirocin ointment  1 application Nasal BID  . rOPINIRole  4 mg Oral QHS  . rOPINIRole  4 mg Oral q1800  . sodium chloride flush  3 mL Intravenous Q12H   Continuous Infusions: . sodium chloride    . sodium chloride 1 mL/kg/hr (06/02/17 0842)  . heparin 2,200 Units/hr (06/02/17 0414)  . nitroGLYCERIN 50 mcg/min (06/02/17 0300)   PRN Meds: sodium chloride, acetaminophen, clonazePAM, morphine injection, ondansetron (ZOFRAN) IV, sodium chloride flush   Vital Signs    Vitals:   06/01/17 2344 06/02/17 0300 06/02/17 0349 06/02/17 0827  BP:   130/73 (!) 155/93  Pulse: (!) 103 97 99 92  Resp:   (!) 22   Temp:   97.8 F (36.6 C)   TempSrc:   Oral   SpO2: 93% (!) 87% 92%   Weight:   (!) 321 lb 6.9 oz (145.8 kg)   Height:        Intake/Output Summary (Last 24 hours) at 06/02/2017 4268 Last data filed at 06/02/2017 0400 Gross per 24 hour    Intake 1110.27 ml  Output 1500 ml  Net -389.73 ml   Filed Weights   06/01/17 0530 06/02/17 0349  Weight: (!) 323 lb 3.1 oz (146.6 kg) (!) 321 lb 6.9 oz (145.8 kg)    Telemetry    Sinus rhythm/mild sinus tachycardia- Personally Reviewed  ECG    No new tracing- Personally Reviewed  Physical Exam  Obese, limiting the exam GEN: No acute distress.   Neck: No JVD Cardiac: RRR, no murmurs, rubs, or gallops.  Respiratory: Clear to auscultation bilaterally. GI: Soft, nontender, non-distended  MS: No edema; No deformity. Neuro:  Nonfocal  Psych: Normal affect   Labs    Chemistry Recent Labs  Lab 05/31/17 2326 06/02/17 0259  NA 132* 136  K 3.2* 3.5  CL 95* 99*  CO2 26 25  GLUCOSE 389* 255*  BUN 26* 18  CREATININE 1.36* 1.20  CALCIUM 8.9 8.6*  GFRNONAA 58* >60  GFRAA >60 >60  ANIONGAP 11 12     Hematology Recent Labs  Lab 05/31/17 2326 06/01/17 1119 06/01/17 1214 06/02/17 0259  WBC 15.9*  --  11.2* 15.1*  RBC 3.76* 4.24 4.14* 4.12*  HGB 10.6*  --  11.4* 11.2*  HCT 29.6*  --  33.1*  33.1*  MCV 78.7  --  80.0 80.3  MCH 28.2  --  27.5 27.2  MCHC 35.8  --  34.4 33.8  RDW 14.4  --  14.5 14.5  PLT 281  --  219 204    Cardiac Enzymes Recent Labs  Lab 06/01/17 0249  TROPONINI 0.04*    Recent Labs  Lab 05/31/17 2335 06/01/17 0259  TROPIPOC 0.03 0.03     BNPNo results for input(s): BNP, PROBNP in the last 168 hours.   DDimer  Recent Labs  Lab 05/31/17 2326  DDIMER 0.38     Radiology    Dg Chest 2 View  Result Date: 05/31/2017 CLINICAL DATA:  Pt having left side upper chest pains that travel down left arm that started about 90 mins ago. High anxiety, restless leg syndrome, SOB, coughing, pt takes HTN meds, pt is diabetic, nonsmoker, no hx of asthma. EXAM: CHEST  2 VIEW COMPARISON:  10/12/2016 FINDINGS: The heart size and mediastinal contours are within normal limits. Both lungs are clear. No pleural effusion or pneumothorax. The visualized  skeletal structures are unremarkable. IMPRESSION: No active cardiopulmonary disease. Electronically Signed   By: Lajean Manes M.D.   On: 05/31/2017 23:11   Ct Angio Chest/abd/pel For Dissection W And/or Wo Contrast  Result Date: 06/01/2017 CLINICAL DATA:  Left-sided chest pain since 2100 hours radiating down left arm. EXAM: CT ANGIOGRAPHY CHEST, ABDOMEN AND PELVIS TECHNIQUE: Multidetector CT imaging through the chest, abdomen and pelvis was performed using the standard protocol during bolus administration of intravenous contrast. Multiplanar reconstructed images and MIPs were obtained and reviewed to evaluate the vascular anatomy. CONTRAST:  100 cc Isovue 370 IV COMPARISON:  None. FINDINGS: CTA CHEST FINDINGS Cardiovascular: Normal size heart without pericardial effusion. Moderate-to-marked atherosclerotic calcifications of the LAD and left circumflex. No aortic aneurysm or dissection. No acute central pulmonary embolus. Mediastinum/Nodes: No enlarged mediastinal, hilar, or axillary lymph nodes. Hypodense 11 mm right thyroid gland nodule. No thyromegaly. Midline trachea without deviation. Patent trachea and mainstem bronchi. Esophagus is unremarkable. Lungs/Pleura: Subpleural scarring or atelectasis in the right lower lobe. No effusion or pneumothorax. No dominant mass. Musculoskeletal: Osteophytes are noted along the lower thoracic spine. Degenerative disc disease L5-S1. Review of the MIP images confirms the above findings. CTA ABDOMEN AND PELVIS FINDINGS VASCULAR Aorta: Normal caliber aorta without aneurysm, dissection, vasculitis or significant stenosis. Celiac: Patent without evidence of aneurysm, dissection, vasculitis or significant stenosis. SMA: Patent without evidence of aneurysm, dissection, vasculitis or significant stenosis. Renals: Both renal arteries are patent without evidence of aneurysm, dissection, vasculitis, fibromuscular dysplasia or significant stenosis. IMA: Patent without evidence of  aneurysm, dissection, vasculitis or significant stenosis. Inflow: Patent without evidence of aneurysm, dissection, vasculitis or significant stenosis. Veins: No obvious venous abnormality within the limitations of this arterial phase study. Review of the MIP images confirms the above findings. NON-VASCULAR Hepatobiliary: Mild hepatic steatosis. No focal liver abnormality. No gallstones or secondary signs of acute cholecystitis. Pancreas: Normal Spleen: Normal Adrenals/Urinary Tract: Adrenal glands are unremarkable. Kidneys are normal, without renal calculi, focal lesion, or hydronephrosis. Bladder is unremarkable. Stomach/Bowel: Physiologic distention of the stomach with normal small bowel rotation. No bowel obstruction or inflammation. Normal appendix. Scattered colonic diverticulosis without acute diverticulitis. Lymphatic: No lymphadenopathy. Reproductive: The prostate and seminal vesicles are unremarkable. Other: Fat containing periumbilical and bilateral inguinal hernias. Musculoskeletal: Mild bony exostosis of the right iliac crest possibly related to prior bone graft donor site. Degenerative disc disease L5-S1. Review of the MIP images confirms the  above findings. IMPRESSION: 1. Coronary arteriosclerosis along the LAD and left circumflex. 2. No aortic aneurysm or dissection. No acute large central pulmonary embolus. 3. Periumbilical fat containing ventral hernia with fat containing bilateral inguinal hernias. 4. Mild hepatic steatosis. Electronically Signed   By: Ashley Royalty M.D.   On: 06/01/2017 01:56    Cardiac Studies   for cath today  Patient Profile     52 y.o. male with hypertension, type 2 diabetes mellitus, morbid obesity, gout, hyperlipidemia, obstructive sleep apnea on CPAP, anxiety and restless leg syndrome, presents with symptoms very concerning for unstable angina pectoris, ECG changes and borderline cardiac troponin level.  Extensive coronary artery calcification seen on CT  chest. 1. Assessment & Plan    1.  Unstable angina: For coronary angiography and possible percutaneous revascularization today.This procedure has been fully reviewed with the patient and written informed consent has been obtained.  Noninvasive studies are likely to be nondiagnostic due to severe obesity. 2. HTN: Better blood pressure control, not at target.  Increase carvedilol.  Was on an ACE inhibitor prior to admission, on hold until after coronary angiography. 3. CKD 3: He is not only obese but also quite muscular, I think his serum creatinine overestimates the severity of his renal dysfunction.  Creatinine has improved substantially since admission. 4. DM: Poorly controlled, important long-term target. 5.HLP: Hyperlipidemia pattern is strongly consistent with insulin resistance/obesity and poorly controlled diabetes mellitus.  Should be on a statin regardless of findings on catheterization with target LDL less than 70. 6. Gout: Watch for exacerbation intervene promptly in setting of repeated contrast exposure and variations in renal function. 7.  Leukocytosis: Etiology uncertain, no overt pneumonia, bland urinalysis. 8. OSA: Use his own CPAP equipment last night. 9.  Anxiety: Very anxious, will definitely require sedation for angiography.   For questions or updates, please contact [place] Please consult [email or web address] for contact info under Cardiology/STEMI.      Signed, Sanda Klein, MD  06/02/2017, 9:09 AM

## 2017-06-03 ENCOUNTER — Encounter (HOSPITAL_COMMUNITY): Payer: Self-pay | Admitting: Cardiovascular Disease

## 2017-06-03 DIAGNOSIS — I25118 Atherosclerotic heart disease of native coronary artery with other forms of angina pectoris: Secondary | ICD-10-CM

## 2017-06-03 LAB — CBC
HCT: 32.6 % — ABNORMAL LOW (ref 39.0–52.0)
Hemoglobin: 11 g/dL — ABNORMAL LOW (ref 13.0–17.0)
MCH: 27.3 pg (ref 26.0–34.0)
MCHC: 33.7 g/dL (ref 30.0–36.0)
MCV: 80.9 fL (ref 78.0–100.0)
Platelets: 187 10*3/uL (ref 150–400)
RBC: 4.03 MIL/uL — ABNORMAL LOW (ref 4.22–5.81)
RDW: 14.6 % (ref 11.5–15.5)
WBC: 11.4 10*3/uL — ABNORMAL HIGH (ref 4.0–10.5)

## 2017-06-03 LAB — GLUCOSE, CAPILLARY
Glucose-Capillary: 199 mg/dL — ABNORMAL HIGH (ref 65–99)
Glucose-Capillary: 290 mg/dL — ABNORMAL HIGH (ref 65–99)
Glucose-Capillary: 226 mg/dL — ABNORMAL HIGH (ref 65–99)

## 2017-06-03 LAB — BASIC METABOLIC PANEL
Anion gap: 11 (ref 5–15)
BUN: 13 mg/dL (ref 6–20)
CO2: 25 mmol/L (ref 22–32)
Calcium: 8.3 mg/dL — ABNORMAL LOW (ref 8.9–10.3)
Chloride: 100 mmol/L — ABNORMAL LOW (ref 101–111)
Creatinine, Ser: 1.09 mg/dL (ref 0.61–1.24)
GFR calc Af Amer: >60 mL/min (ref >60)
GFR calc non Af Amer: >60 mL/min (ref >60)
Glucose, Bld: 292 mg/dL — ABNORMAL HIGH (ref 65–99)
Potassium: 3.6 mmol/L (ref 3.5–5.1)
Sodium: 136 mmol/L (ref 135–145)

## 2017-06-03 MED ORDER — LISINOPRIL 20 MG PO TABS: 20.0000 mg | ORAL_TABLET | Freq: Every day | ORAL | 0 refills | Status: DC

## 2017-06-03 MED ORDER — ATORVASTATIN CALCIUM 80 MG PO TABS: 80.0000 mg | ORAL_TABLET | Freq: Every day | ORAL | 0 refills | Status: DC

## 2017-06-03 MED ORDER — BLOOD GLUCOSE MONITOR KIT: PACK | 0 refills | Status: DC

## 2017-06-03 MED ORDER — ATORVASTATIN CALCIUM 40 MG PO TABS: 40.0000 mg | ORAL_TABLET | Freq: Every day | ORAL | 0 refills | Status: DC

## 2017-06-03 MED ORDER — CARVEDILOL 6.25 MG PO TABS: 6.2500 mg | ORAL_TABLET | Freq: Two times a day (BID) | ORAL | 0 refills | Status: DC

## 2017-06-03 MED ORDER — INSULIN NPH ISOPHANE & REGULAR (70-30) 100 UNIT/ML ~~LOC~~ SUSP: SUBCUTANEOUS | 0 refills | Status: DC

## 2017-06-03 MED FILL — Lidocaine HCl Local Inj 1%: INTRAMUSCULAR | Qty: 20 | Status: AC

## 2017-06-03 MED FILL — Heparin Sodium (Porcine) 2 Unit/ML in Sodium Chloride 0.9%: INTRAMUSCULAR | Qty: 1000 | Status: AC

## 2017-06-03 NOTE — Progress Notes (Signed)
Patient has home CPAP, assisted with new mask and placed patient on for HS.

## 2017-06-03 NOTE — Care Management Note (Addendum)
Case Management Note  Patient Details  Name: Justin Hall MRN: [id number] Date of Birth: Jan 11, 1966  Subjective/Objective:  From home with wife, pta indep, he is new diabetic using insulin/pens, diabetic coordinator to see him.  He has PCP Precious Haws, and wife will make follow up apt.  Also he does not have any insurance to help with medications, he will have insurance on March 1.  NCM will assist him with a Match Letter for his medications today.  Match letter given to patient.  Wife scheduled follow up apt for 3/5 at 2 pm with his PCP Precious Haws.                 Action/Plan: DC home with Medication assist with Match letter.   Expected Discharge Date:  06/02/17               Expected Discharge Plan:  Home/Self Care  In-House Referral:     Discharge planning Services  CM Consult, Medication Assistance, [facility] Program  Post Acute Care Choice:    Choice offered to:     DME Arranged:    DME Agency:     HH Arranged:    HH Agency:     Status of Service:  Completed, signed off  If discussed at [employer], dates discussed:    Additional Comments:  Zenon Mayo, RN 06/03/2017, 9:27 AM

## 2017-06-03 NOTE — Progress Notes (Addendum)
Inpatient Diabetes Program Recommendations  AACE/ADA: New Consensus Statement on Inpatient Glycemic Control (2015)  Target Ranges:  Prepandial:   less than 140 mg/dL      Peak postprandial:   less than 180 mg/dL (1-2 hours)      Critically ill patients:  140 - 180 mg/dL   Lab Results  Component Value Date   GLUCAP 290 (H) 06/03/2017   HGBA1C 10.5 (H) 06/02/2017    Review of Glycemic Control Results for DENIS, CARREON (MRN [id number]) as of 06/03/2017 10:24  Ref. Range 06/02/2017 17:37 06/02/2017 21:33 06/03/2017 03:02 06/03/2017 06:15  Glucose-Capillary Latest Ref Range: 65 - 99 mg/dL 199 (H) 243 (H)  290 (H)   Diabetes history: Type 2 DM, newly diagnosed Outpatient Diabetes medications: none Current orders for Inpatient glycemic control: Novolog 0-5 Units QHS, Novolog 0-9 Units TID  Inpatient Diabetes Program Recommendations:    In the event patient remains inpatient, consider adding Lantus 22 Units QHS. Will plan to see patient today to discuss outpatient needs and plan for discharge.  Thanks, Bronson Curb, MSN, RNC-OB Diabetes Coordinator [phone] (8a-5p)

## 2017-06-03 NOTE — Progress Notes (Signed)
Progress Note  Patient Name: Justin Hall Date of Encounter: 06/03/2017  Primary Cardiologist: Sanda Klein, MD   Subjective   Feeling better today. No chest pain. No dyspnea. Radial cath site is ok.   Inpatient Medications    Scheduled Meds: . allopurinol  300 mg Oral q morning - 10a  . aspirin EC  81 mg Oral q morning - 10a  . atorvastatin  80 mg Oral q1800  . carvedilol  6.25 mg Oral BID WC  . Chlorhexidine Gluconate Cloth  6 each Topical Q0600  . insulin aspart  0-15 Units Subcutaneous TID WC  . insulin aspart  0-5 Units Subcutaneous QHS  . mupirocin ointment  1 application Nasal BID  . rOPINIRole  4 mg Oral QHS  . rOPINIRole  4 mg Oral q1800  . sodium chloride flush  3 mL Intravenous Q12H   Continuous Infusions: . sodium chloride    . nitroGLYCERIN Stopped (06/02/17 0900)   PRN Meds: sodium chloride, acetaminophen, clonazePAM, morphine injection, ondansetron (ZOFRAN) IV, sodium chloride flush   Vital Signs    Vitals:   06/02/17 2215 06/02/17 2232 06/03/17 0528 06/03/17 0649  BP: (!) 170/93 (!) 168/68 (!) 177/89 (!) 166/97  Pulse: 92 93 91 86  Resp: 16 14 (!) 24 (!) 22  Temp:   98 F (36.7 C)   TempSrc:   Oral   SpO2: 96% 96% 93% 94%  Weight:   (!) 322 lb 15.6 oz (146.5 kg)   Height:        Intake/Output Summary (Last 24 hours) at 06/03/2017 0752 Last data filed at 06/03/2017 0340 Gross per 24 hour  Intake 1372.5 ml  Output 780 ml  Net 592.5 ml   Filed Weights   06/01/17 0530 06/02/17 0349 06/03/17 0528  Weight: (!) 323 lb 3.1 oz (146.6 kg) (!) 321 lb 6.9 oz (145.8 kg) (!) 322 lb 15.6 oz (146.5 kg)    Telemetry    NSR - Personally Reviewed  ECG    NSR, incomplete RBBB- Personally Reviewed  Physical Exam   GEN: No acute distress.  Morbidly obese  Neck: No JVD Cardiac: RRR, no murmurs, rubs, or gallops.  Respiratory: Clear to auscultation bilaterally. GI: Soft, nontender, non-distended  MS: No edema; No deformity. Neuro:  Nonfocal    Psych: Normal affect   Labs    Chemistry Recent Labs  Lab 05/31/17 2326 06/02/17 0259 06/03/17 0302  NA 132* 136 136  K 3.2* 3.5 3.6  CL 95* 99* 100*  CO2 26 25 25   GLUCOSE 389* 255* 292*  BUN 26* 18 13  CREATININE 1.36* 1.20 1.09  CALCIUM 8.9 8.6* 8.3*  GFRNONAA 58* >60 >60  GFRAA >60 >60 >60  ANIONGAP 11 12 11      Hematology Recent Labs  Lab 06/01/17 1214 06/02/17 0259 06/03/17 0302  WBC 11.2* 15.1* 11.4*  RBC 4.14* 4.12* 4.03*  HGB 11.4* 11.2* 11.0*  HCT 33.1* 33.1* 32.6*  MCV 80.0 80.3 80.9  MCH 27.5 27.2 27.3  MCHC 34.4 33.8 33.7  RDW 14.5 14.5 14.6  PLT 219 204 187    Cardiac Enzymes Recent Labs  Lab 06/01/17 0249  TROPONINI 0.04*    Recent Labs  Lab 05/31/17 2335 06/01/17 0259  TROPIPOC 0.03 0.03     BNPNo results for input(s): BNP, PROBNP in the last 168 hours.   DDimer  Recent Labs  Lab 05/31/17 2326  DDIMER 0.38     Radiology    No results found.  Cardiac Studies  Procedures   LEFT HEART CATH AND CORONARY ANGIOGRAPHY 06/02/17  Conclusion     Prox Cx to Mid Cx lesion is 20% stenosed.  Ost LAD to Prox LAD lesion is 20% stenosed.  Prox LAD lesion is 30% stenosed.  Mid LAD to Dist LAD lesion is 20% stenosed.  Ost 1st Diag lesion is 30% stenosed.  Ost 2nd Diag lesion is 40% stenosed.  The left ventricular systolic function is normal.  LV end diastolic pressure is normal.  The left ventricular ejection fraction is 55-65% by visual estimate.  There is no mitral valve regurgitation.   1. Non-obstructive CAD noted 2. Normal LV systolic function  Recommendations: Medical management of CAD. Aggressive risk factor reduction.     Echo 06/02/17 Study Conclusions  - Left ventricle: The cavity size was normal. Wall thickness was   increased in a pattern of moderate LVH. Systolic function was   vigorous. The estimated ejection fraction was in the range of 65%   to 70%. Wall motion was normal; there were no  regional wall   motion abnormalities. Doppler parameters are consistent with   abnormal left ventricular relaxation (grade 1 diastolic   dysfunction). - Inferior vena cava: The vessel was dilated. The respirophasic   diameter changes were blunted (< 50%), consistent with elevated   central venous pressure. - Pericardium, extracardiac: A prominent pericardial fat pad was   present.  Patient Profile     52 y.o. male with hypertension, type 2 diabetes mellitus, morbid obesity, gout, hyperlipidemia, obstructive sleep apnea on CPAP, anxiety and restless leg syndrome, who initially presented with symptoms very concerning for unstable angina pectoris, ECG changes, borderline cardiac troponin level and extensive coronary artery calcification seen on CT chest, however definitive LHC 06/02/17 showed mild nonobstructive CAD.  Assessment & Plan    1. CAD: mild nonobstructive disease noted on cath 06/02/17. Angiographic details outlined above. Recommendations are for medical management and risk factor reduction. He needs better control of his DM and HTN. Continue high dose statin therapy with Lipitor to get LDL at target at < 70 mg/dL. Continue ASA + BB. Weight reduction also advised.   2. Chest Pain: definitive cath r/o obstructive CAD. D-dimer negative, ruling out PE. Echo unremarkable.   3. HTN: elevated this morning at 166/97. Continue Coreg BID. Given DM, would recommend addition of an ACE/ARB. SCr 1.09. It appears he was on amlodipine-benazepril at home but not ordered on admit. Would resume.   4. CKD: SCr improved this admit, down from 1.36>>1.09. Given DM and poorly controlled HTN, would resume his home benazepril. Recheck BMP in 7-10 days.   5. DM: poorly controlled. Hgb A1c 10.5. On insulin. Further management per primary  6. HLD: LDL 102 mg/dL. Given CAD, goal LDL is <70 mg/dL. Continue statin therapy with Lipitor and recheck FLP + HFTs in 6-8 weeks. If not at goal, add Zetia.   7. Post Cath:  radial access site and renal function stable.   Dispo: Likely d/c home today. Stable from a cardiac standpoint. Given mild CAD, we will need to continue to follow. He can see an APP in our office post hospital in 2 weeks and can f/u with Dr. Sallyanne Kuster yearly.   For questions or updates, please contact [place] Please consult [email or web address] for contact info under Cardiology/STEMI.      Signed, Lyda Jester, PA-C  06/03/2017, 7:52 AM    I have seen and examined the patient along with Lyda Jester, PA-C.  I have reviewed the  chart, notes and new data.  I agree with PA's note.  Key new complaints: no angina. Eager to start exercising Key examination changes: no problems at cath access site Key new findings / data: angiogram results reviewed  PLAN: Aggressive risk factor management, focusing on regular exercise, improved dietary habits, statin, glucose control (prefer SGL2 inhibitors, Victoza, metformin over insulin and sulfonyurea)., prefer carvedilol and RAAS inhibitors for BP control. DC home today.  Sanda Klein, MD, [place] [phone] 06/03/2017, 10:25 AM

## 2017-06-03 NOTE — Progress Notes (Signed)
Discharge instructions (including medications) discussed with and copy provided to patient/caregiver. Paper prescriptions received by patient's spouse. Also verified they received [place] letter for medications.

## 2017-06-03 NOTE — Discharge Summary (Signed)
Physician Discharge Summary  Justin Hall LKT:[id number] DOB: [birth year] DOA: 05/31/2017  PCP: Bartholome Bill, MD  Admit date: 05/31/2017 Discharge date: 06/03/2017  Time spent: 45 minutes  Recommendations for Outpatient Follow-up:  1. Patient will need outpatient A1c and further monitoring with primary care physician and glucose checks-diabetic coordinator follow-up prior to discharge 2. New prescriptions of 7030 insulin 10 units twice daily, lisinopril, Lipitor on discharge-A1c is above 8 and by guidelines patient should be on insulin-metformin not optimal choice in his case 3. Needs basic metabolic panel in 1 week given on ACE inhibitor  Discharge Diagnoses:  Principal Problem:   Chest pain Active Problems:   Hypertensive urgency   DM2 (diabetes mellitus, type 2) ([place])   Discharge Condition: Improved  Diet recommendation: Heart healthy diabetic  Filed Weights   06/01/17 0530 06/02/17 0349 06/03/17 0528  Weight: (!) 146.6 kg (323 lb 3.1 oz) (!) 145.8 kg (321 lb 6.9 oz) (!) 146.5 kg (322 lb 15.6 oz)    History of present illness:  52 year old male known HTN DM TY 2 not on meds, HLD, gout, OSA on CPAP and bipolar --admitted on 06/01/2017 with left-sided chest pressure with radiation no cardiac history and found to be hypertensive and noncompliant on meds EKG showed nonspecific T wave inversions sodium 132 glucose 389 WBC 15 dimer 0.3 CT of chest negative coronary atherosclerosis noted  Hospital Course:  Chest pain-unstable angina troponin trend was flat cardiac cath was done showing only 30% stenosis triglycerides 397 started on aspirin and statin HTN uncontrolled-start on Coreg resumed lisinopril at 40 mg dosing discontinued amlodipine this admission better controlled Diabetes mellitus type 2 A1c 10-discussed with diabetes coordinator and note that as A1c is above 8 not a good candidate for oral meds patient willing to take shots and will go forward with the same Chronic  kidney disease stage III creatinine 1.2 and stabilized during hospital stay Leukocytosis reactive and no further workup Anxiety continue Klonopin  Procedures: Cardiac catheterization Conclusion     Prox Cx to Mid Cx lesion is 20% stenosed.  Ost LAD to Prox LAD lesion is 20% stenosed.  Prox LAD lesion is 30% stenosed.  Mid LAD to Dist LAD lesion is 20% stenosed.  Ost 1st Diag lesion is 30% stenosed.  Ost 2nd Diag lesion is 40% stenosed.  The left ventricular systolic function is normal.  LV end diastolic pressure is normal.  The left ventricular ejection fraction is 55-65% by visual estimate.  There is no mitral valve regurgitation.      Consultations:  Cardiology  Discharge Exam: Vitals:   06/03/17 0649 06/03/17 0700  BP: (!) 166/97 (!) 165/84  Pulse: 86 82  Resp: (!) 22 (!) 22  Temp:  97.8 F (36.6 C)  SpO2: 94% 98%    General: Awake alert pleasant no distress Cardiovascular: S1-S2 no murmur rub or gallop Respiratory: Chest clear no added sounds note Abdomen soft obese nontender no distention  Discharge Instructions   Discharge Instructions    Diet - low sodium heart healthy   Complete by:  As directed    Discharge instructions   Complete by:  As directed    Take inusulin 70/30 10 units 2 x a day and monitor your sugar levels with the meter Would continue Coreg and Lisinopril added Lipitor this admit Ned labs in about 1 week Meds can be adjusted as an OP with Cardiology post discharge and with your pirmary MD   Increase activity slowly   Complete by:  As  directed      Allergies as of 06/03/2017   No Known Allergies     Medication List    STOP taking these medications   amLODipine-benazepril 5-10 MG capsule Commonly known as:  LOTREL     TAKE these medications   allopurinol 300 MG tablet Commonly known as:  ZYLOPRIM Take 300 mg by mouth every morning.   aspirin EC 81 MG tablet Take 81 mg by mouth every morning.   atorvastatin 80  MG tablet Commonly known as:  LIPITOR Take 1 tablet (80 mg total) by mouth daily at 6 PM.   blood glucose meter kit and supplies Kit Dispense based on patient and insurance preference. Use up to four times daily as directed. (FOR ICD-9 250.00, 250.01).   carvedilol 6.25 MG tablet Commonly known as:  COREG Take 1 tablet (6.25 mg total) by mouth 2 (two) times daily with a meal.   clonazePAM 2 MG tablet Commonly known as:  KLONOPIN Take 2 mg by mouth 2 (two) times daily as needed for anxiety.   colchicine 0.6 MG tablet Commonly known as:  COLCRYS Take 1 tablet (0.6 mg total) by mouth 2 (two) times daily. What changed:    when to take this  reasons to take this   insulin NPH-regular Human (70-30) 100 UNIT/ML injection Commonly known as:  NOVOLIN 70/30 Take 10 units twice a day   lisinopril 20 MG tablet Commonly known as:  PRINIVIL,ZESTRIL Take 1 tablet (20 mg total) by mouth daily.   rOPINIRole 4 MG tablet Commonly known as:  REQUIP Take 4 mg by mouth at bedtime.      No Known Allergies Follow-up Information    Bartholome Bill, MD Follow up on 06/23/2017.   Specialty:  Family Medicine Why:  2 pm for follow up apt Contact information: [address] [place] [address] [phone]            The results of significant diagnostics from this hospitalization (including imaging, microbiology, ancillary and laboratory) are listed below for reference.    Significant Diagnostic Studies: Dg Chest 2 View  Result Date: 05/31/2017 CLINICAL DATA:  Pt having left side upper chest pains that travel down left arm that started about 90 mins ago. High anxiety, restless leg syndrome, SOB, coughing, pt takes HTN meds, pt is diabetic, nonsmoker, no hx of asthma. EXAM: CHEST  2 VIEW COMPARISON:  10/12/2016 FINDINGS: The heart size and mediastinal contours are within normal limits. Both lungs are clear. No pleural effusion or pneumothorax. The visualized  skeletal structures are unremarkable. IMPRESSION: No active cardiopulmonary disease. Electronically Signed   By: Lajean Manes M.D.   On: 05/31/2017 23:11   Ct Angio Chest/abd/pel For Dissection W And/or Wo Contrast  Result Date: 06/01/2017 CLINICAL DATA:  Left-sided chest pain since 2100 hours radiating down left arm. EXAM: CT ANGIOGRAPHY CHEST, ABDOMEN AND PELVIS TECHNIQUE: Multidetector CT imaging through the chest, abdomen and pelvis was performed using the standard protocol during bolus administration of intravenous contrast. Multiplanar reconstructed images and MIPs were obtained and reviewed to evaluate the vascular anatomy. CONTRAST:  100 cc Isovue 370 IV COMPARISON:  None. FINDINGS: CTA CHEST FINDINGS Cardiovascular: Normal size heart without pericardial effusion. Moderate-to-marked atherosclerotic calcifications of the LAD and left circumflex. No aortic aneurysm or dissection. No acute central pulmonary embolus. Mediastinum/Nodes: No enlarged mediastinal, hilar, or axillary lymph nodes. Hypodense 11 mm right thyroid gland nodule. No thyromegaly. Midline trachea without deviation. Patent trachea and mainstem bronchi.  Esophagus is unremarkable. Lungs/Pleura: Subpleural scarring or atelectasis in the right lower lobe. No effusion or pneumothorax. No dominant mass. Musculoskeletal: Osteophytes are noted along the lower thoracic spine. Degenerative disc disease L5-S1. Review of the MIP images confirms the above findings. CTA ABDOMEN AND PELVIS FINDINGS VASCULAR Aorta: Normal caliber aorta without aneurysm, dissection, vasculitis or significant stenosis. Celiac: Patent without evidence of aneurysm, dissection, vasculitis or significant stenosis. SMA: Patent without evidence of aneurysm, dissection, vasculitis or significant stenosis. Renals: Both renal arteries are patent without evidence of aneurysm, dissection, vasculitis, fibromuscular dysplasia or significant stenosis. IMA: Patent without evidence of  aneurysm, dissection, vasculitis or significant stenosis. Inflow: Patent without evidence of aneurysm, dissection, vasculitis or significant stenosis. Veins: No obvious venous abnormality within the limitations of this arterial phase study. Review of the MIP images confirms the above findings. NON-VASCULAR Hepatobiliary: Mild hepatic steatosis. No focal liver abnormality. No gallstones or secondary signs of acute cholecystitis. Pancreas: Normal Spleen: Normal Adrenals/Urinary Tract: Adrenal glands are unremarkable. Kidneys are normal, without renal calculi, focal lesion, or hydronephrosis. Bladder is unremarkable. Stomach/Bowel: Physiologic distention of the stomach with normal small bowel rotation. No bowel obstruction or inflammation. Normal appendix. Scattered colonic diverticulosis without acute diverticulitis. Lymphatic: No lymphadenopathy. Reproductive: The prostate and seminal vesicles are unremarkable. Other: Fat containing periumbilical and bilateral inguinal hernias. Musculoskeletal: Mild bony exostosis of the right iliac crest possibly related to prior bone graft donor site. Degenerative disc disease L5-S1. Review of the MIP images confirms the above findings. IMPRESSION: 1. Coronary arteriosclerosis along the LAD and left circumflex. 2. No aortic aneurysm or dissection. No acute large central pulmonary embolus. 3. Periumbilical fat containing ventral hernia with fat containing bilateral inguinal hernias. 4. Mild hepatic steatosis. Electronically Signed   By: Ashley Royalty M.D.   On: 06/01/2017 01:56    Microbiology: Recent Results (from the past 240 hour(s))  MRSA PCR Screening     Status: Abnormal   Collection Time: 06/01/17  5:39 AM  Result Value Ref Range Status   MRSA by PCR POSITIVE (A) NEGATIVE Final    Comment:        The GeneXpert MRSA Assay (FDA approved for NASAL specimens only), is one component of a comprehensive MRSA colonization surveillance program. It is not intended to  diagnose MRSA infection nor to guide or monitor treatment for MRSA infections. RESULT CALLED TO, READ BACK BY AND VERIFIED WITH: RN Iverson Alamin [id number] [phone] MLM Performed at [facility], [place] [address], [place], [place] [address]      Labs: Basic Metabolic Panel: Recent Labs  Lab 05/31/17 2326 06/02/17 0259 06/03/17 0302  NA 132* 136 136  K 3.2* 3.5 3.6  CL 95* 99* 100*  CO2 _0 GLUCOSE 389* 255* 292*  BUN 26* 18 13  CREATININE 1.36* 1.20 1.09  CALCIUM 8.9 8.6* 8.3*   Liver Function Tests: No results for input(s): AST, ALT, ALKPHOS, BILITOT, PROT, ALBUMIN in the last 168 hours. No results for input(s): LIPASE, AMYLASE in the last 168 hours. No results for input(s): AMMONIA in the last 168 hours. CBC: Recent Labs  Lab 05/31/17 2326 06/01/17 1214 06/02/17 0259 06/03/17 0302  WBC 15.9* 11.2* 15.1* 11.4*  HGB 10.6* 11.4* 11.2* 11.0*  HCT 29.6* 33.1* 33.1* 32.6*  MCV 78.7 80.0 80.3 80.9  PLT 281 219 204 187   Cardiac Enzymes: Recent Labs  Lab 06/01/17 0249  TROPONINI 0.04*   BNP: BNP (last 3 results) No results for input(s): BNP in the last  8760 hours.  ProBNP (last 3 results) No results for input(s): PROBNP in the last 8760 hours.  CBG: Recent Labs  Lab 06/02/17 0821 06/02/17 1226 06/02/17 1737 06/02/17 2133 06/03/17 0615  GLUCAP 243* 194* 199* 243* 290*       Signed:  Nita Sells MD   [facility] 06/03/2017, 12:46 PM

## 2017-06-03 NOTE — Progress Notes (Addendum)
TR BAND REMOVAL  LOCATION:    right radial  DEFLATED PER PROTOCOL:    Yes.    TIME BAND OFF / DRESSING APPLIED:    1930   SITE UPON ARRIVAL:    Level 0  SITE AFTER BAND REMOVAL:    Level 0  CIRCULATION SENSATION AND MOVEMENT:    Within Normal Limits   Yes.    COMMENTS:   Pt.tolerated procedure well

## 2017-07-08 DIAGNOSIS — M25572 Pain in left ankle and joints of left foot: Secondary | ICD-10-CM | POA: Diagnosis not present

## 2017-07-08 DIAGNOSIS — E119 Type 2 diabetes mellitus without complications: Secondary | ICD-10-CM | POA: Diagnosis not present

## 2017-07-08 DIAGNOSIS — M109 Gout, unspecified: Secondary | ICD-10-CM | POA: Diagnosis not present

## 2017-07-08 DIAGNOSIS — M10062 Idiopathic gout, left knee: Secondary | ICD-10-CM | POA: Diagnosis not present

## 2017-07-08 DIAGNOSIS — M25562 Pain in left knee: Secondary | ICD-10-CM | POA: Diagnosis not present

## 2017-07-08 DIAGNOSIS — I10 Essential (primary) hypertension: Secondary | ICD-10-CM | POA: Diagnosis not present

## 2017-07-08 DIAGNOSIS — M10072 Idiopathic gout, left ankle and foot: Secondary | ICD-10-CM | POA: Diagnosis not present

## 2017-08-12 ENCOUNTER — Encounter: Payer: Self-pay | Admitting: Family Medicine

## 2017-08-12 ENCOUNTER — Ambulatory Visit: Payer: 59 | Admitting: Family Medicine

## 2017-08-12 VITALS — BP 150/110 | HR 89 | Temp 97.9°F | Ht 69.5 in | Wt 311.5 lb

## 2017-08-12 DIAGNOSIS — M2550 Pain in unspecified joint: Secondary | ICD-10-CM | POA: Diagnosis not present

## 2017-08-12 DIAGNOSIS — M1A9XX0 Chronic gout, unspecified, without tophus (tophi): Secondary | ICD-10-CM | POA: Diagnosis not present

## 2017-08-12 DIAGNOSIS — I10 Essential (primary) hypertension: Secondary | ICD-10-CM | POA: Diagnosis not present

## 2017-08-12 DIAGNOSIS — L989 Disorder of the skin and subcutaneous tissue, unspecified: Secondary | ICD-10-CM

## 2017-08-12 LAB — URIC ACID: Uric Acid, Serum: 9.2 mg/dL — ABNORMAL HIGH (ref 4.0–7.8)

## 2017-08-12 MED ORDER — TRAZODONE HCL 50 MG PO TABS: 25.0000 mg | ORAL_TABLET | Freq: Every evening | ORAL | 3 refills | Status: DC | PRN

## 2017-08-12 MED ORDER — CARVEDILOL 6.25 MG PO TABS: 6.2500 mg | ORAL_TABLET | Freq: Two times a day (BID) | ORAL | 1 refills | Status: DC

## 2017-08-12 MED ORDER — GLUCOSE BLOOD VI STRP: ORAL_STRIP | 11 refills | Status: DC

## 2017-08-12 MED ORDER — ATORVASTATIN CALCIUM 80 MG PO TABS: 80.0000 mg | ORAL_TABLET | Freq: Every day | ORAL | 1 refills | Status: DC

## 2017-08-12 MED ORDER — LISINOPRIL 20 MG PO TABS: 20.0000 mg | ORAL_TABLET | Freq: Every day | ORAL | 2 refills | Status: DC

## 2017-08-12 MED ORDER — ROPINIROLE HCL 4 MG PO TABS: 4.0000 mg | ORAL_TABLET | Freq: Three times a day (TID) | ORAL | 5 refills | Status: DC

## 2017-08-12 MED ORDER — LISINOPRIL 20 MG PO TABS
20.0000 mg | ORAL_TABLET | Freq: Every day | ORAL | 2 refills | Status: DC
Start: 2017-08-12 — End: 2017-08-12

## 2017-08-12 MED ORDER — HYDROCODONE-ACETAMINOPHEN 7.5-325 MG PO TABS: 1.0000 | ORAL_TABLET | Freq: Four times a day (QID) | ORAL | 0 refills | Status: DC | PRN

## 2017-08-12 MED ORDER — CHLORTHALIDONE 25 MG PO TABS: 12.5000 mg | ORAL_TABLET | Freq: Every day | ORAL | 2 refills | Status: DC

## 2017-08-12 MED FILL — CHLORTHALIDONE 25 MG TAB: 25 | 30 days supply | Qty: 15 | Fill #0

## 2017-08-12 MED FILL — CARVEDILOL 6.25 MG TAB: 6.25 | 30 days supply | Qty: 60 | Fill #0

## 2017-08-12 MED FILL — ATORVASTATIN 80 MG TABLET: 80 | 30 days supply | Qty: 30 | Fill #0

## 2017-08-12 MED FILL — traZODone HCL 50 MG TABS: 50 | 30 days supply | Qty: 30 | Fill #0

## 2017-08-12 MED FILL — rOPINIRole HCL 4 MG TABS: 4 | 30 days supply | Qty: 90 | Fill #0

## 2017-08-12 MED FILL — LISINOPRIL 20 MG TABLET: 20 | 30 days supply | Qty: 30 | Fill #0

## 2017-08-12 MED FILL — HYDROCODON-APAP 7.5-325: 7.5-325 | 8 days supply | Qty: 30 | Fill #0

## 2017-08-12 NOTE — Progress Notes (Signed)
Pre visit review using our clinic review tool, if applicable. No additional management support is needed unless otherwise documented below in the visit note. 
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
## 2017-08-12 NOTE — Patient Instructions (Signed)
If you do not hear anything about your referral in the next 1-2 weeks, call our office and ask for an update.  Keep the diet as clean as possible.  Healthy Eating Plan Many factors influence your heart health, including eating and exercise habits. Heart (coronary) risk increases with abnormal blood fat (lipid) levels. Heart-healthy meal planning includes limiting unhealthy fats, increasing healthy fats, and making other small dietary changes. This includes maintaining a healthy body weight to help keep lipid levels within a normal range.  WHAT IS MY PLAN?  Your health care provider recommends that you:  Drink a glass of water before meals to help with satiety.  Eat slowly.  An alternative to the water is to add Metamucil. This will help with satiety as well. It does contain calories, unlike water.  WHAT TYPES OF FAT SHOULD I CHOOSE?  Choose healthy fats more often. Choose monounsaturated and polyunsaturated fats, such as olive oil and canola oil, flaxseeds, walnuts, almonds, and seeds.  Eat more omega-3 fats. Good choices include salmon, mackerel, sardines, tuna, flaxseed oil, and ground flaxseeds. Aim to eat fish at least two times each week.  Avoid foods with partially hydrogenated oils in them. These contain trans fats. Examples of foods that contain trans fats are stick margarine, some tub margarines, cookies, crackers, and other baked goods. If you are going to avoid a fat, this is the one to avoid!  WHAT GENERAL GUIDELINES DO I NEED TO FOLLOW?  Check food labels carefully to identify foods with trans fats. Avoid these types of options when possible.  Fill one half of your plate with vegetables and green salads. Eat 4-5 servings of vegetables per day. A serving of vegetables equals 1 cup of raw leafy vegetables,  cup of raw or cooked cut-up vegetables, or  cup of vegetable juice.  Fill one fourth of your plate with whole grains. Look for the word "whole" as the first word in the  ingredient list.  Fill one fourth of your plate with lean protein foods.  Eat 4-5 servings of fruit per day. A serving of fruit equals one medium whole fruit,  cup of dried fruit,  cup of fresh, frozen, or canned fruit. Try to avoid fruits in cups/syrups as the sugar content can be high.  Eat more foods that contain soluble fiber. Examples of foods that contain this type of fiber are apples, broccoli, carrots, beans, peas, and barley. Aim to get 20-30 g of fiber per day.  Eat more home-cooked food and less restaurant, buffet, and fast food.  Limit or avoid alcohol.  Limit foods that are high in starch and sugar.  Avoid fried foods when able.  Cook foods by using methods other than frying. Baking, boiling, grilling, and broiling are all great options. Other fat-reducing suggestions include: ? Removing the skin from poultry. ? Removing all visible fats from meats. ? Skimming the fat off of stews, soups, and gravies before serving them. ? Steaming vegetables in water or broth.  Lose weight if you are overweight. Losing just 5-10% of your initial body weight can help your overall health and prevent diseases such as diabetes and heart disease.  Increase your consumption of nuts, legumes, and seeds to 4-5 servings per week. One serving of dried beans or legumes equals  cup after being cooked, one serving of nuts equals 1 ounces, and one serving of seeds equals  ounce or 1 tablespoon.  WHAT ARE GOOD FOODS CAN I EAT? Grains Grainy breads (try to  find bread that is 3 g of fiber per slice or greater), oatmeal, light popcorn. Whole-grain cereals. Rice and pasta, including brown rice and those that are made with whole wheat. Edamame pasta is a great alternative to grain pasta. It has a higher protein content. Try to avoid significant consumption of white bread, sugary cereals, or pastries/baked goods.  Vegetables All vegetables. Cooked white potatoes do not count as vegetables.  Fruits All  fruits, but limit pineapple and bananas as these fruits have a higher sugar content.  Meats and Other Protein Sources Lean, well-trimmed beef, veal, pork, and lamb. Chicken and [place] without skin. All fish and shellfish. Wild duck, rabbit, pheasant, and venison. Egg whites or low-cholesterol egg substitutes. Dried beans, peas, lentils, and tofu.Seeds and most nuts.  Dairy Low-fat or nonfat cheeses, including ricotta, string, and mozzarella. Skim or 1% milk that is liquid, powdered, or evaporated. Buttermilk that is made with low-fat milk. Nonfat or low-fat yogurt. Soy/Almond milk are good alternatives if you cannot handle dairy.  Beverages Water is the best for you. Sports drinks with less sugar are more desirable unless you are a highly active athlete.  Sweets and Desserts Sherbets and fruit ices. Honey, jam, marmalade, jelly, and syrups. Dark chocolate.  Eat all sweets and desserts in moderation.  Fats and Oils Nonhydrogenated (trans-free) margarines. Vegetable oils, including soybean, sesame, sunflower, olive, peanut, safflower, corn, canola, and cottonseed. Salad dressings or mayonnaise that are made with a vegetable oil. Limit added fats and oils that you use for cooking, baking, salads, and as spreads.  Other Cocoa powder. Coffee and tea. Most condiments.  The items listed above may not be a complete list of recommended foods or beverages. Contact your dietitian for more options.

## 2017-08-14 ENCOUNTER — Other Ambulatory Visit: Payer: 59

## 2017-09-14 DIAGNOSIS — G2581 Restless legs syndrome: Secondary | ICD-10-CM | POA: Diagnosis not present

## 2017-09-15 MED FILL — traZODone HCL 50 MG TABS: 50 | 30 days supply | Qty: 30 | Fill #1 | Status: TO

## 2017-09-15 MED FILL — rOPINIRole HCL 4 MG TABS: 4 | 30 days supply | Qty: 90 | Fill #1 | Status: TO

## 2017-10-08 ENCOUNTER — Encounter (INDEPENDENT_AMBULATORY_CARE_PROVIDER_SITE_OTHER): Payer: Self-pay

## 2017-11-02 ENCOUNTER — Ambulatory Visit (INDEPENDENT_AMBULATORY_CARE_PROVIDER_SITE_OTHER): Payer: Managed Care, Other (non HMO) | Admitting: Family Medicine

## 2017-11-18 ENCOUNTER — Ambulatory Visit: Payer: 59 | Admitting: Family Medicine

## 2017-11-18 ENCOUNTER — Encounter: Payer: Self-pay | Admitting: Family Medicine

## 2017-11-18 ENCOUNTER — Other Ambulatory Visit: Payer: Self-pay | Admitting: Family Medicine

## 2017-11-18 VITALS — BP 154/94 | HR 84 | Temp 97.0°F | Ht 69.0 in | Wt 309.2 lb

## 2017-11-18 DIAGNOSIS — M1A9XX0 Chronic gout, unspecified, without tophus (tophi): Secondary | ICD-10-CM

## 2017-11-18 DIAGNOSIS — M21619 Bunion of unspecified foot: Secondary | ICD-10-CM | POA: Diagnosis not present

## 2017-11-18 DIAGNOSIS — I10 Essential (primary) hypertension: Secondary | ICD-10-CM | POA: Diagnosis not present

## 2017-11-18 LAB — COMPREHENSIVE METABOLIC PANEL
ALT: 10 U/L (ref 0–53)
AST: 10 U/L (ref 0–37)
Albumin: 4.1 g/dL (ref 3.5–5.2)
Alkaline Phosphatase: 73 U/L (ref 39–117)
BUN: 22 mg/dL (ref 6–23)
CO2: 32 mEq/L (ref 19–32)
Calcium: 9.2 mg/dL (ref 8.4–10.5)
Chloride: 101 mEq/L (ref 96–112)
Creatinine, Ser: 1.23 mg/dL (ref 0.40–1.50)
GFR: 65.08 mL/min (ref >60.00)
Glucose, Bld: 191 mg/dL — ABNORMAL HIGH (ref 70–99)
Potassium: 4 mEq/L (ref 3.5–5.1)
Sodium: 141 mEq/L (ref 135–145)
Total Bilirubin: 0.6 mg/dL (ref 0.2–1.2)
Total Protein: 7.2 g/dL (ref 6.0–8.3)

## 2017-11-18 LAB — URIC ACID: Uric Acid, Serum: 10.1 mg/dL — ABNORMAL HIGH (ref 4.0–7.8)

## 2017-11-18 MED ORDER — INSULIN NPH ISOPHANE & REGULAR (70-30) 100 UNIT/ML ~~LOC~~ SUSP: SUBCUTANEOUS | 1 refills | Status: DC

## 2017-11-18 MED ORDER — HYDROCODONE-ACETAMINOPHEN 7.5-325 MG PO TABS: 1.0000 | ORAL_TABLET | Freq: Four times a day (QID) | ORAL | 0 refills | Status: DC | PRN

## 2017-11-18 MED ORDER — LISINOPRIL-HYDROCHLOROTHIAZIDE 20-25 MG PO TABS: 1.0000 | ORAL_TABLET | Freq: Every day | ORAL | 3 refills | Status: DC

## 2017-11-18 MED ORDER — ALLOPURINOL 300 MG PO TABS: 300.0000 mg | ORAL_TABLET | Freq: Every morning | ORAL | 1 refills | Status: DC

## 2017-11-18 MED FILL — HYDROCODON-APAP 7.5-325: 7.5-325 | 8 days supply | Qty: 30 | Fill #0
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
## 2017-11-18 NOTE — Patient Instructions (Addendum)
If you do not hear anything about your referral in the next 1-2 weeks, call our office and ask for an update.  Eat healthy.  Keep physically active.  Keep checking blood pressure at home.  Let us know if you need anything.

## 2017-11-18 NOTE — Progress Notes (Signed)
Pre visit review using our clinic review tool, if applicable. No additional management support is needed unless otherwise documented below in the visit note. 

## 2017-11-19 ENCOUNTER — Other Ambulatory Visit: Payer: Self-pay | Admitting: Family Medicine

## 2017-11-19 MED ORDER — INSULIN NPH ISOPHANE & REGULAR (70-30) 100 UNIT/ML ~~LOC~~ SUSP: SUBCUTANEOUS | 1 refills | Status: DC

## 2017-11-20 ENCOUNTER — Ambulatory Visit (INDEPENDENT_AMBULATORY_CARE_PROVIDER_SITE_OTHER): Payer: 59

## 2017-11-20 ENCOUNTER — Encounter: Payer: Self-pay | Admitting: Podiatry

## 2017-11-20 ENCOUNTER — Ambulatory Visit: Payer: 59 | Admitting: Podiatry

## 2017-11-20 DIAGNOSIS — M21619 Bunion of unspecified foot: Secondary | ICD-10-CM

## 2017-11-20 DIAGNOSIS — M205X2 Other deformities of toe(s) (acquired), left foot: Secondary | ICD-10-CM | POA: Diagnosis not present

## 2017-11-20 DIAGNOSIS — M21612 Bunion of left foot: Secondary | ICD-10-CM | POA: Diagnosis not present
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
## 2017-12-01 ENCOUNTER — Other Ambulatory Visit: Payer: 59 | Admitting: Orthotics

## 2017-12-07 ENCOUNTER — Ambulatory Visit (INDEPENDENT_AMBULATORY_CARE_PROVIDER_SITE_OTHER): Payer: 59 | Admitting: Orthotics

## 2017-12-07 DIAGNOSIS — M21612 Bunion of left foot: Secondary | ICD-10-CM | POA: Diagnosis not present

## 2017-12-07 DIAGNOSIS — M205X2 Other deformities of toe(s) (acquired), left foot: Secondary | ICD-10-CM | POA: Diagnosis not present

## 2017-12-07 DIAGNOSIS — M79671 Pain in right foot: Secondary | ICD-10-CM | POA: Diagnosis not present

## 2017-12-07 DIAGNOSIS — M21619 Bunion of unspecified foot: Secondary | ICD-10-CM

## 2017-12-07 NOTE — Progress Notes (Signed)
Patient came into today to be cast for [employer] Orthotics. Upon recommendation of Dr. Jacqualyn Posey Patient presents with Bunion/FHL OA Goals are support and stability 1st ray. Plan vendor [employer]

## 2017-12-11 ENCOUNTER — Emergency Department (HOSPITAL_BASED_OUTPATIENT_CLINIC_OR_DEPARTMENT_OTHER)
Admission: EM | Admit: 2017-12-11 | Discharge: 2017-12-11 | Disposition: A | Discharge status: Home / Self Care | Payer: 59 | Attending: Emergency Medicine | Admitting: Emergency Medicine

## 2017-12-11 ENCOUNTER — Other Ambulatory Visit: Payer: Self-pay

## 2017-12-11 ENCOUNTER — Emergency Department (HOSPITAL_BASED_OUTPATIENT_CLINIC_OR_DEPARTMENT_OTHER): Payer: 59

## 2017-12-11 ENCOUNTER — Encounter (HOSPITAL_BASED_OUTPATIENT_CLINIC_OR_DEPARTMENT_OTHER): Payer: Self-pay | Admitting: Emergency Medicine

## 2017-12-11 DIAGNOSIS — Y999 Unspecified external cause status: Secondary | ICD-10-CM | POA: Diagnosis not present

## 2017-12-11 DIAGNOSIS — Y92812 Truck as the place of occurrence of the external cause: Secondary | ICD-10-CM | POA: Diagnosis not present

## 2017-12-11 DIAGNOSIS — R600 Localized edema: Secondary | ICD-10-CM | POA: Diagnosis not present

## 2017-12-11 DIAGNOSIS — S4992XA Unspecified injury of left shoulder and upper arm, initial encounter: Secondary | ICD-10-CM | POA: Diagnosis not present

## 2017-12-11 DIAGNOSIS — Z79899 Other long term (current) drug therapy: Secondary | ICD-10-CM | POA: Diagnosis not present

## 2017-12-11 DIAGNOSIS — E119 Type 2 diabetes mellitus without complications: Secondary | ICD-10-CM | POA: Diagnosis not present

## 2017-12-11 DIAGNOSIS — W1789XA Other fall from one level to another, initial encounter: Secondary | ICD-10-CM | POA: Insufficient documentation

## 2017-12-11 DIAGNOSIS — Y939 Activity, unspecified: Secondary | ICD-10-CM | POA: Diagnosis not present

## 2017-12-11 DIAGNOSIS — I10 Essential (primary) hypertension: Secondary | ICD-10-CM | POA: Insufficient documentation

## 2017-12-11 DIAGNOSIS — Z794 Long term (current) use of insulin: Secondary | ICD-10-CM | POA: Diagnosis not present

## 2017-12-11 DIAGNOSIS — W19XXXA Unspecified fall, initial encounter: Secondary | ICD-10-CM

## 2017-12-11 DIAGNOSIS — Z7982 Long term (current) use of aspirin: Secondary | ICD-10-CM | POA: Diagnosis not present

## 2017-12-11 DIAGNOSIS — S59902A Unspecified injury of left elbow, initial encounter: Secondary | ICD-10-CM | POA: Diagnosis not present

## 2017-12-11 DIAGNOSIS — M25522 Pain in left elbow: Secondary | ICD-10-CM | POA: Diagnosis not present

## 2017-12-11 DIAGNOSIS — M25512 Pain in left shoulder: Secondary | ICD-10-CM | POA: Diagnosis not present

## 2017-12-11 MED ORDER — DICLOFENAC SODIUM ER 100 MG PO TB24: 100.0000 mg | ORAL_TABLET | Freq: Every day | ORAL | 0 refills | Status: DC

## 2017-12-11 MED ORDER — LIDOCAINE 5 % EX PTCH: 1.0000 | MEDICATED_PATCH | CUTANEOUS | 0 refills | Status: DC

## 2017-12-11 MED ORDER — KETOROLAC TROMETHAMINE 60 MG/2ML IM SOLN
30.0000 mg | Freq: Once | INTRAMUSCULAR | Status: AC
  Administered 2017-12-11: 30 mg via INTRAMUSCULAR
  Filled 2017-12-11: qty 2
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
## 2017-12-11 NOTE — ED Triage Notes (Signed)
Patient presents with complaints of fall Thursday afternoon off of the back of his pickup truck while cleaning it out; co left elbow pain. Denies LOC; ambulatory without difficulty; skin to left elbow intact; no obvious deformity noted full rom noted. nad noted; states he drove himself here and his wife is on the way.

## 2017-12-13 ENCOUNTER — Other Ambulatory Visit: Payer: Self-pay

## 2017-12-13 ENCOUNTER — Emergency Department (HOSPITAL_BASED_OUTPATIENT_CLINIC_OR_DEPARTMENT_OTHER): Payer: 59

## 2017-12-13 ENCOUNTER — Encounter (HOSPITAL_BASED_OUTPATIENT_CLINIC_OR_DEPARTMENT_OTHER): Payer: Self-pay | Admitting: *Deleted

## 2017-12-13 ENCOUNTER — Emergency Department: Payer: 59

## 2017-12-13 ENCOUNTER — Emergency Department (HOSPITAL_BASED_OUTPATIENT_CLINIC_OR_DEPARTMENT_OTHER)
Admission: EM | Admit: 2017-12-13 | Discharge: 2017-12-13 | Disposition: A | Discharge status: Home / Self Care | Payer: 59 | Attending: Emergency Medicine | Admitting: Emergency Medicine

## 2017-12-13 DIAGNOSIS — M7022 Olecranon bursitis, left elbow: Secondary | ICD-10-CM | POA: Insufficient documentation

## 2017-12-13 DIAGNOSIS — Y929 Unspecified place or not applicable: Secondary | ICD-10-CM | POA: Diagnosis not present

## 2017-12-13 DIAGNOSIS — R600 Localized edema: Secondary | ICD-10-CM | POA: Diagnosis not present

## 2017-12-13 DIAGNOSIS — Y999 Unspecified external cause status: Secondary | ICD-10-CM | POA: Insufficient documentation

## 2017-12-13 DIAGNOSIS — Y9389 Activity, other specified: Secondary | ICD-10-CM | POA: Insufficient documentation

## 2017-12-13 DIAGNOSIS — Z79899 Other long term (current) drug therapy: Secondary | ICD-10-CM | POA: Diagnosis not present

## 2017-12-13 DIAGNOSIS — M25522 Pain in left elbow: Secondary | ICD-10-CM | POA: Diagnosis present

## 2017-12-13 DIAGNOSIS — W1839XA Other fall on same level, initial encounter: Secondary | ICD-10-CM | POA: Insufficient documentation

## 2017-12-13 DIAGNOSIS — E119 Type 2 diabetes mellitus without complications: Secondary | ICD-10-CM | POA: Insufficient documentation

## 2017-12-13 DIAGNOSIS — Z7982 Long term (current) use of aspirin: Secondary | ICD-10-CM | POA: Diagnosis not present

## 2017-12-13 DIAGNOSIS — I10 Essential (primary) hypertension: Secondary | ICD-10-CM | POA: Diagnosis not present

## 2017-12-13 MED ORDER — PREDNISONE 20 MG PO TABS: 40.0000 mg | ORAL_TABLET | Freq: Every day | ORAL | 0 refills | Status: AC

## 2017-12-13 MED ORDER — OXYCODONE-ACETAMINOPHEN 5-325 MG PO TABS
2.0000 | ORAL_TABLET | Freq: Once | ORAL | Status: AC
  Administered 2017-12-13: 2 via ORAL
  Filled 2017-12-13: qty 2

## 2017-12-13 MED ORDER — HYDROCODONE-ACETAMINOPHEN 5-325 MG PO TABS: 1.0000 | ORAL_TABLET | ORAL | 0 refills | Status: DC | PRN

## 2017-12-13 MED ORDER — PREDNISONE 50 MG PO TABS
60.0000 mg | ORAL_TABLET | Freq: Once | ORAL | Status: AC
  Administered 2017-12-13: 60 mg via ORAL
  Filled 2017-12-13: qty 1
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
## 2017-12-13 NOTE — ED Notes (Addendum)
EDP into room, prior to RN assessment, see MD notes, pending orders.   Alert, NAD, calm, interactive, resps e/u, speaking in clear complete sentences, no dyspnea noted, skin W&D, VSS, c/o L elbow pain, seen previously for the same, h/o gout, also recent fall/impact, skin intact, swelling and redness noted, has been wearing ACE wrap, no relief with allopurinaol, no other pain meds PTA, (denies: sob, nausea, dizziness or visual changes).

## 2017-12-13 NOTE — ED Triage Notes (Addendum)
Pt states that he was in here 48 hours ago for an elbow injury. States xrays were neg and he was told it was "bruised". Pt c/o elbow pain since 6pm. States constant, shooting type pain. States history of gout and concerned the injury caused a flare up. Pt with swelling to left arm and redness to hand.

## 2017-12-14 ENCOUNTER — Other Ambulatory Visit: Payer: Self-pay | Admitting: Family Medicine

## 2017-12-14 ENCOUNTER — Ambulatory Visit (INDEPENDENT_AMBULATORY_CARE_PROVIDER_SITE_OTHER): Payer: 59 | Admitting: Family Medicine

## 2017-12-14 ENCOUNTER — Ambulatory Visit: Payer: 59 | Admitting: Family Medicine

## 2017-12-14 ENCOUNTER — Encounter: Payer: Self-pay | Admitting: Family Medicine

## 2017-12-14 VITALS — BP 136/98 | HR 96 | Temp 97.9°F | Ht 69.0 in | Wt 306.4 lb

## 2017-12-14 DIAGNOSIS — M1A9XX0 Chronic gout, unspecified, without tophus (tophi): Secondary | ICD-10-CM

## 2017-12-14 DIAGNOSIS — I10 Essential (primary) hypertension: Secondary | ICD-10-CM | POA: Diagnosis not present

## 2017-12-14 DIAGNOSIS — M7022 Olecranon bursitis, left elbow: Secondary | ICD-10-CM | POA: Diagnosis not present

## 2017-12-14 LAB — URIC ACID: Uric Acid, Serum: 9.5 mg/dL — ABNORMAL HIGH (ref 4.0–7.8)

## 2017-12-14 MED ORDER — INSULIN NPH ISOPHANE & REGULAR (70-30) 100 UNIT/ML ~~LOC~~ SUSP: SUBCUTANEOUS | 1 refills | Status: DC

## 2017-12-14 MED ORDER — HYDROCODONE-ACETAMINOPHEN 5-325 MG PO TABS: 1.0000 | ORAL_TABLET | ORAL | 0 refills | Status: DC | PRN
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
## 2017-12-14 NOTE — Patient Instructions (Addendum)
Keep the diet clean.  Continue icing.  Finish the prednisone.   Mind the diet.  Let us know if you need anything.

## 2017-12-14 NOTE — Progress Notes (Signed)
Pre visit review using our clinic review tool, if applicable. No additional management support is needed unless otherwise documented below in the visit note. 

## 2017-12-28 ENCOUNTER — Encounter: Payer: 59 | Admitting: Orthotics

## 2018-01-05 ENCOUNTER — Telehealth: Payer: Self-pay | Admitting: Family Medicine

## 2018-01-05 NOTE — Telephone Encounter (Signed)
Copied from [place] [id number]. Topic: Quick Communication - See Telephone Encounter >> Jan 05, 2018 11:30 AM Percell Belt A wrote: CRM for notification. See Telephone encounter for: 01/05/18.  Pt wife called in and stated that pt Is currently having a Gout attack and would like to know if Dr Nani Ravens could call in med for him without coming in?    Best number  [phone] or [phone] [place] [facility] [place] [facility]

## 2018-01-06 MED ORDER — INDOMETHACIN 50 MG PO CAPS: 50.0000 mg | ORAL_CAPSULE | Freq: Two times a day (BID) | ORAL | 0 refills | Status: DC

## 2018-01-06 NOTE — Telephone Encounter (Signed)
Patient aware.

## 2018-01-06 NOTE — Telephone Encounter (Signed)
OK 

## 2018-01-21 ENCOUNTER — Other Ambulatory Visit: Payer: Self-pay | Admitting: Family Medicine

## 2018-01-22 ENCOUNTER — Other Ambulatory Visit: Payer: Self-pay | Admitting: Family Medicine

## 2018-01-25 ENCOUNTER — Ambulatory Visit (INDEPENDENT_AMBULATORY_CARE_PROVIDER_SITE_OTHER): Payer: 59 | Admitting: Family Medicine

## 2018-01-25 ENCOUNTER — Encounter: Payer: Self-pay | Admitting: Family Medicine

## 2018-01-25 VITALS — BP 150/100 | HR 84 | Temp 98.5°F | Ht 69.0 in | Wt 305.4 lb

## 2018-01-25 DIAGNOSIS — Z114 Encounter for screening for human immunodeficiency virus [HIV]: Secondary | ICD-10-CM

## 2018-01-25 DIAGNOSIS — E119 Type 2 diabetes mellitus without complications: Secondary | ICD-10-CM | POA: Diagnosis not present

## 2018-01-25 DIAGNOSIS — Z23 Encounter for immunization: Secondary | ICD-10-CM | POA: Diagnosis not present

## 2018-01-25 DIAGNOSIS — I10 Essential (primary) hypertension: Secondary | ICD-10-CM

## 2018-01-25 DIAGNOSIS — Z1159 Encounter for screening for other viral diseases: Secondary | ICD-10-CM | POA: Diagnosis not present

## 2018-01-25 DIAGNOSIS — F5104 Psychophysiologic insomnia: Secondary | ICD-10-CM | POA: Diagnosis not present

## 2018-01-25 DIAGNOSIS — M1A9XX0 Chronic gout, unspecified, without tophus (tophi): Secondary | ICD-10-CM

## 2018-01-25 DIAGNOSIS — R69 Illness, unspecified: Secondary | ICD-10-CM | POA: Diagnosis not present

## 2018-01-25 LAB — CBC
HCT: 35.5 % — ABNORMAL LOW (ref 39.0–52.0)
Hemoglobin: 11.9 g/dL — ABNORMAL LOW (ref 13.0–17.0)
MCHC: 33.5 g/dL (ref 30.0–36.0)
MCV: 74.9 fl — ABNORMAL LOW (ref 78.0–100.0)
Platelets: 242 10*3/uL (ref 150.0–400.0)
RBC: 4.73 Mil/uL (ref 4.22–5.81)
RDW: 15.8 % — ABNORMAL HIGH (ref 11.5–15.5)
WBC: 11.4 10*3/uL — ABNORMAL HIGH (ref 4.0–10.5)

## 2018-01-25 LAB — LIPID PANEL
Cholesterol: 195 mg/dL (ref 0–200)
HDL: 34.4 mg/dL — ABNORMAL LOW (ref >39.00)
NonHDL: 160.61
Total CHOL/HDL Ratio: 6
Triglycerides: 214 mg/dL — ABNORMAL HIGH (ref 0.0–149.0)
VLDL: 42.8 mg/dL — ABNORMAL HIGH (ref 0.0–40.0)

## 2018-01-25 LAB — MICROALBUMIN / CREATININE URINE RATIO
Creatinine,U: 143.8 mg/dL
Microalb Creat Ratio: 15.7 mg/g (ref 0.0–30.0)
Microalb, Ur: 22.6 mg/dL — ABNORMAL HIGH (ref 0.0–1.9)

## 2018-01-25 LAB — COMPREHENSIVE METABOLIC PANEL
ALT: 7 U/L (ref 0–53)
AST: 10 U/L (ref 0–37)
Albumin: 3.9 g/dL (ref 3.5–5.2)
Alkaline Phosphatase: 70 U/L (ref 39–117)
BUN: 28 mg/dL — ABNORMAL HIGH (ref 6–23)
CO2: 30 mEq/L (ref 19–32)
Calcium: 9 mg/dL (ref 8.4–10.5)
Chloride: 100 mEq/L (ref 96–112)
Creatinine, Ser: 1.16 mg/dL (ref 0.40–1.50)
GFR: 69.58 mL/min (ref >60.00)
Glucose, Bld: 156 mg/dL — ABNORMAL HIGH (ref 70–99)
Potassium: 4.1 mEq/L (ref 3.5–5.1)
Sodium: 137 mEq/L (ref 135–145)
Total Bilirubin: 0.4 mg/dL (ref 0.2–1.2)
Total Protein: 7.2 g/dL (ref 6.0–8.3)

## 2018-01-25 LAB — URIC ACID: Uric Acid, Serum: 8.8 mg/dL — ABNORMAL HIGH (ref 4.0–7.8)

## 2018-01-25 LAB — LDL CHOLESTEROL, DIRECT: Direct LDL: 120 mg/dL

## 2018-01-25 LAB — HEMOGLOBIN A1C: Hgb A1c MFr Bld: 7.8 % — ABNORMAL HIGH (ref 4.6–6.5)

## 2018-01-25 MED ORDER — METFORMIN HCL 500 MG PO TABS: ORAL_TABLET | ORAL | 1 refills | Status: DC

## 2018-01-25 MED ORDER — GLYBURIDE 5 MG PO TABS: 5.0000 mg | ORAL_TABLET | Freq: Every day | ORAL | 2 refills | Status: DC

## 2018-01-25 MED ORDER — AMITRIPTYLINE HCL 25 MG PO TABS: 25.0000 mg | ORAL_TABLET | Freq: Every day | ORAL | 2 refills | Status: DC
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
## 2018-01-25 NOTE — Progress Notes (Signed)
Pre visit review using our clinic review tool, if applicable. No additional management support is needed unless otherwise documented below in the visit note. 

## 2018-01-25 NOTE — Patient Instructions (Addendum)
Please consider counseling. Contact [phone] to schedule an appointment or inquire about cost/insurance coverage.  Sleep Hygiene Tips:  Do not watch TV or look at screens within 1 hour of going to bed. If you do, make sure there is a blue light filter (nighttime mode) involved.  Try to go to bed around the same time every night. Wake up at the same time within 1 hour of regular time. Ex: If you wake up at 7 AM for work, do not sleep past 8 AM on days that you don't work.  Do not drink alcohol before bedtime.  Do not consume caffeine-containing beverages after noon or within 9 hours of intended bedtime.  Get regular exercise/physical activity in your life, but not within 2 hours of planned bedtime.  Do not take naps.   Do not eat within 2 hours of planned bedtime.  Melatonin, 3-5 mg 30-60 minutes before planned bedtime may be helpful.   The bed should be for sleep or sex only. If after 20-30 minutes you are unable to fall asleep, get up and do something relaxing. Do this until you feel ready to go to sleep again.   Give [place] 2-3 business days to get the results of your labs back.   Check your sugars around 2-3 times per week. Alternate checking in the morning before you eat, in the afternoon and before bed. Write them down and bring it to your next appointment.   Foods to AVOID: Red meat, organ meat (liver), lunch meat, seafood (mussels, scallops, anchovies, etc) Alcohol Sugary foods/beverages (diet soft drinks have no link to flares)  Foods to migrate to: Dairy Vegetables Cherries have limited data to suggest they help lower uric acid levels (and prevent flares) Vit C (500 mg daily) may have a modest effect with preventing flares Poultry If you are going to eat red meat, beef and pork may give you less problems than lamb.  If you do not hear anything about your referral in the next 1-2 weeks, call our office and ask for an update.  Give [place] 2-3 business days to get the  results of your labs back.   Let us know if you need anything.

## 2018-01-26 LAB — HEPATITIS C ANTIBODY
Hepatitis C Ab: NONREACTIVE
SIGNAL TO CUT-OFF: 0.15 (ref <1.00)

## 2018-01-26 LAB — HIV ANTIBODY (ROUTINE TESTING W REFLEX): HIV 1&2 Ab, 4th Generation: NONREACTIVE

## 2018-01-27 ENCOUNTER — Other Ambulatory Visit: Payer: Self-pay | Admitting: Family Medicine

## 2018-01-27 ENCOUNTER — Other Ambulatory Visit (INDEPENDENT_AMBULATORY_CARE_PROVIDER_SITE_OTHER): Payer: 59

## 2018-01-27 DIAGNOSIS — D509 Iron deficiency anemia, unspecified: Secondary | ICD-10-CM | POA: Diagnosis not present

## 2018-01-27 DIAGNOSIS — Z1211 Encounter for screening for malignant neoplasm of colon: Secondary | ICD-10-CM

## 2018-01-27 LAB — IBC PANEL
Iron: 32 ug/dL — ABNORMAL LOW (ref 42–165)
Saturation Ratios: 8.9 % — ABNORMAL LOW (ref 20.0–50.0)
Transferrin: 258 mg/dL (ref 212.0–360.0)

## 2018-01-27 MED ORDER — COLCHICINE 0.6 MG PO TABS: 0.6000 mg | ORAL_TABLET | Freq: Every day | ORAL | 2 refills | Status: DC

## 2018-01-28 ENCOUNTER — Encounter: Payer: Self-pay | Admitting: Gastroenterology

## 2018-02-06 DIAGNOSIS — J069 Acute upper respiratory infection, unspecified: Secondary | ICD-10-CM | POA: Diagnosis not present

## 2018-02-11 ENCOUNTER — Emergency Department (HOSPITAL_BASED_OUTPATIENT_CLINIC_OR_DEPARTMENT_OTHER)
Admission: EM | Admit: 2018-02-11 | Discharge: 2018-02-11 | Disposition: A | Discharge status: Home / Self Care | Payer: 59 | Attending: Emergency Medicine | Admitting: Emergency Medicine

## 2018-02-11 ENCOUNTER — Other Ambulatory Visit: Payer: Self-pay

## 2018-02-11 ENCOUNTER — Emergency Department (HOSPITAL_BASED_OUTPATIENT_CLINIC_OR_DEPARTMENT_OTHER): Payer: 59

## 2018-02-11 ENCOUNTER — Encounter (HOSPITAL_BASED_OUTPATIENT_CLINIC_OR_DEPARTMENT_OTHER): Payer: Self-pay | Admitting: Emergency Medicine

## 2018-02-11 DIAGNOSIS — J40 Bronchitis, not specified as acute or chronic: Secondary | ICD-10-CM | POA: Diagnosis not present

## 2018-02-11 DIAGNOSIS — Z79899 Other long term (current) drug therapy: Secondary | ICD-10-CM | POA: Diagnosis not present

## 2018-02-11 DIAGNOSIS — E119 Type 2 diabetes mellitus without complications: Secondary | ICD-10-CM | POA: Diagnosis not present

## 2018-02-11 DIAGNOSIS — Z7982 Long term (current) use of aspirin: Secondary | ICD-10-CM | POA: Insufficient documentation

## 2018-02-11 DIAGNOSIS — J4540 Moderate persistent asthma, uncomplicated: Secondary | ICD-10-CM | POA: Insufficient documentation

## 2018-02-11 DIAGNOSIS — R0602 Shortness of breath: Secondary | ICD-10-CM | POA: Diagnosis not present

## 2018-02-11 DIAGNOSIS — R05 Cough: Secondary | ICD-10-CM | POA: Diagnosis not present

## 2018-02-11 DIAGNOSIS — I10 Essential (primary) hypertension: Secondary | ICD-10-CM | POA: Insufficient documentation

## 2018-02-11 LAB — BASIC METABOLIC PANEL
Anion gap: 11 (ref 5–15)
BUN: 27 mg/dL — ABNORMAL HIGH (ref 6–20)
CO2: 27 mmol/L (ref 22–32)
Calcium: 9.2 mg/dL (ref 8.9–10.3)
Chloride: 101 mmol/L (ref 98–111)
Creatinine, Ser: 1.22 mg/dL (ref 0.61–1.24)
GFR calc Af Amer: >60 mL/min (ref >60)
GFR calc non Af Amer: >60 mL/min (ref >60)
Glucose, Bld: 136 mg/dL — ABNORMAL HIGH (ref 70–99)
Potassium: 3.8 mmol/L (ref 3.5–5.1)
Sodium: 139 mmol/L (ref 135–145)

## 2018-02-11 LAB — CBC WITH DIFFERENTIAL/PLATELET
Abs Immature Granulocytes: 0.04 10*3/uL (ref 0.00–0.07)
Basophils Absolute: 0 10*3/uL (ref 0.0–0.1)
Basophils Relative: 0 %
Eosinophils Absolute: 0.5 10*3/uL (ref 0.0–0.5)
Eosinophils Relative: 3 %
HCT: 36 % — ABNORMAL LOW (ref 39.0–52.0)
Hemoglobin: 11.5 g/dL — ABNORMAL LOW (ref 13.0–17.0)
Immature Granulocytes: 0 %
Lymphocytes Relative: 27 %
Lymphs Abs: 3.5 10*3/uL (ref 0.7–4.0)
MCH: 24.9 pg — ABNORMAL LOW (ref 26.0–34.0)
MCHC: 31.9 g/dL (ref 30.0–36.0)
MCV: 78.1 fL — ABNORMAL LOW (ref 80.0–100.0)
Monocytes Absolute: 0.8 10*3/uL (ref 0.1–1.0)
Monocytes Relative: 6 %
Neutro Abs: 8.5 10*3/uL — ABNORMAL HIGH (ref 1.7–7.7)
Neutrophils Relative %: 64 %
Platelets: 234 10*3/uL (ref 150–400)
RBC: 4.61 MIL/uL (ref 4.22–5.81)
RDW: 14.9 % (ref 11.5–15.5)
WBC: 13.3 10*3/uL — ABNORMAL HIGH (ref 4.0–10.5)
nRBC: 0 % (ref 0.0–0.2)

## 2018-02-11 MED ORDER — DM-GUAIFENESIN ER 30-600 MG PO TB12: 1.0000 | ORAL_TABLET | Freq: Two times a day (BID) | ORAL | 1 refills | Status: DC

## 2018-02-11 MED ORDER — ALBUTEROL SULFATE HFA 108 (90 BASE) MCG/ACT IN AERS
2.0000 | INHALATION_SPRAY | Freq: Four times a day (QID) | RESPIRATORY_TRACT | Status: DC
  Administered 2018-02-11: 2 via RESPIRATORY_TRACT
  Filled 2018-02-11: qty 6.7

## 2018-02-11 MED ORDER — PREDNISONE 10 MG PO TABS: 40.0000 mg | ORAL_TABLET | Freq: Every day | ORAL | 0 refills | Status: DC

## 2018-02-11 MED ORDER — PREDNISONE 50 MG PO TABS
60.0000 mg | ORAL_TABLET | Freq: Once | ORAL | Status: AC
  Administered 2018-02-11: 60 mg via ORAL
  Filled 2018-02-11: qty 1

## 2018-02-11 MED ORDER — IPRATROPIUM-ALBUTEROL 0.5-2.5 (3) MG/3ML IN SOLN
3.0000 mL | Freq: Once | RESPIRATORY_TRACT | Status: AC
  Administered 2018-02-11: 3 mL via RESPIRATORY_TRACT
  Filled 2018-02-11: qty 3

## 2018-02-11 MED ORDER — IPRATROPIUM-ALBUTEROL 0.5-2.5 (3) MG/3ML IN SOLN
3.0000 mL | Freq: Four times a day (QID) | RESPIRATORY_TRACT | Status: DC
  Administered 2018-02-11: 3 mL via RESPIRATORY_TRACT
  Filled 2018-02-11: qty 3

## 2018-02-11 NOTE — Discharge Instructions (Addendum)
Stop the other cough medicine.  Start the Mucinex dm to help suppress the cough and loosen the phlegm.  Use your albuterol inhaler 2 puffs every 6 hours at least for the next 7 days.  Take the prednisone as directed over the next 5 days.  Return for any new or worse symptoms.  Work note provided.

## 2018-02-11 NOTE — ED Triage Notes (Signed)
Cough and head cold sx x1 week.  Started "as a tickle".  Sts he is blowing green mucous but the cough is nonproductive.  Seen by teledoc over the weekend but the cough med is not working.
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
## 2018-02-16 ENCOUNTER — Other Ambulatory Visit: Payer: Self-pay | Admitting: Family Medicine

## 2018-02-22 ENCOUNTER — Ambulatory Visit: Payer: 59 | Admitting: Family Medicine

## 2018-02-25 ENCOUNTER — Encounter: Payer: Self-pay | Admitting: Family Medicine

## 2018-03-01 ENCOUNTER — Telehealth: Payer: Self-pay | Admitting: *Deleted

## 2018-03-01 NOTE — Telephone Encounter (Signed)
Pt no showed his 130 pm PV 03-01-2018. I have attempted to reach the pt by phone, the number listed states the voice mail box is not set up to receive messages, try the call again later.  I mailed a NS letter. I did cancel the PV and scheduled colon for 03-15-18.  [name]

## 2018-03-15 ENCOUNTER — Encounter: Payer: 59 | Admitting: Gastroenterology

## 2018-03-20 DIAGNOSIS — M109 Gout, unspecified: Secondary | ICD-10-CM | POA: Diagnosis not present

## 2018-04-20 ENCOUNTER — Emergency Department (HOSPITAL_BASED_OUTPATIENT_CLINIC_OR_DEPARTMENT_OTHER)
Admission: EM | Admit: 2018-04-20 | Discharge: 2018-04-20 | Disposition: A | Discharge status: Home / Self Care | Payer: 59 | Attending: Emergency Medicine | Admitting: Emergency Medicine

## 2018-04-20 ENCOUNTER — Encounter (HOSPITAL_BASED_OUTPATIENT_CLINIC_OR_DEPARTMENT_OTHER): Payer: Self-pay | Admitting: *Deleted

## 2018-04-20 ENCOUNTER — Other Ambulatory Visit: Payer: Self-pay

## 2018-04-20 DIAGNOSIS — M109 Gout, unspecified: Secondary | ICD-10-CM | POA: Insufficient documentation

## 2018-04-20 DIAGNOSIS — I10 Essential (primary) hypertension: Secondary | ICD-10-CM | POA: Insufficient documentation

## 2018-04-20 DIAGNOSIS — E119 Type 2 diabetes mellitus without complications: Secondary | ICD-10-CM | POA: Diagnosis not present

## 2018-04-20 DIAGNOSIS — Z79899 Other long term (current) drug therapy: Secondary | ICD-10-CM | POA: Diagnosis not present

## 2018-04-20 DIAGNOSIS — Z7982 Long term (current) use of aspirin: Secondary | ICD-10-CM | POA: Diagnosis not present

## 2018-04-20 DIAGNOSIS — R0602 Shortness of breath: Secondary | ICD-10-CM | POA: Diagnosis not present

## 2018-04-20 LAB — CBG MONITORING, ED: Glucose-Capillary: 176 mg/dL — ABNORMAL HIGH (ref 70–99)

## 2018-04-20 MED ORDER — PREDNISONE 20 MG PO TABS: 40.0000 mg | ORAL_TABLET | Freq: Every day | ORAL | 0 refills | Status: DC

## 2018-04-20 MED ORDER — HYDROMORPHONE HCL 1 MG/ML IJ SOLN
2.0000 mg | Freq: Once | INTRAMUSCULAR | Status: AC
  Administered 2018-04-20: 2 mg via INTRAMUSCULAR
  Filled 2018-04-20: qty 2

## 2018-04-20 MED ORDER — HYDROCODONE-ACETAMINOPHEN 5-325 MG PO TABS: 1.0000 | ORAL_TABLET | Freq: Four times a day (QID) | ORAL | 0 refills | Status: DC | PRN

## 2018-04-20 MED ORDER — METHYLPREDNISOLONE SODIUM SUCC 125 MG IJ SOLR
125.0000 mg | Freq: Once | INTRAMUSCULAR | Status: AC
  Administered 2018-04-20: 125 mg via INTRAMUSCULAR
  Filled 2018-04-20: qty 2

## 2018-04-20 NOTE — ED Notes (Signed)
Patient left at this time with all belongings, driven home by wife.

## 2018-04-20 NOTE — ED Triage Notes (Signed)
States he is having a gout attack. Swelling to his left hand and left foot since yesterday. He has had SOB since yesterday.
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
## 2018-04-20 NOTE — ED Notes (Signed)
Pt reporting shortness of breath for the last several days with exertion, no cough. LS diminished, fine crackles on ausculation.

## 2018-05-03 ENCOUNTER — Emergency Department (HOSPITAL_BASED_OUTPATIENT_CLINIC_OR_DEPARTMENT_OTHER): Payer: BLUE CROSS/BLUE SHIELD

## 2018-05-03 ENCOUNTER — Inpatient Hospital Stay (HOSPITAL_BASED_OUTPATIENT_CLINIC_OR_DEPARTMENT_OTHER)
Admission: EM | Admit: 2018-05-03 | Discharge: 2018-05-05 | DRG: 872 | Disposition: A | Discharge status: Home / Self Care | Payer: BLUE CROSS/BLUE SHIELD | Attending: Internal Medicine | Admitting: Internal Medicine

## 2018-05-03 ENCOUNTER — Other Ambulatory Visit: Payer: Self-pay

## 2018-05-03 ENCOUNTER — Encounter (HOSPITAL_BASED_OUTPATIENT_CLINIC_OR_DEPARTMENT_OTHER): Payer: Self-pay | Admitting: Emergency Medicine

## 2018-05-03 ENCOUNTER — Telehealth: Payer: Self-pay | Admitting: Family Medicine

## 2018-05-03 DIAGNOSIS — Z7984 Long term (current) use of oral hypoglycemic drugs: Secondary | ICD-10-CM | POA: Diagnosis not present

## 2018-05-03 DIAGNOSIS — E119 Type 2 diabetes mellitus without complications: Secondary | ICD-10-CM

## 2018-05-03 DIAGNOSIS — Z79899 Other long term (current) drug therapy: Secondary | ICD-10-CM

## 2018-05-03 DIAGNOSIS — M10072 Idiopathic gout, left ankle and foot: Secondary | ICD-10-CM | POA: Diagnosis not present

## 2018-05-03 DIAGNOSIS — L03116 Cellulitis of left lower limb: Secondary | ICD-10-CM | POA: Diagnosis present

## 2018-05-03 DIAGNOSIS — M25572 Pain in left ankle and joints of left foot: Secondary | ICD-10-CM | POA: Diagnosis not present

## 2018-05-03 DIAGNOSIS — M1A9XX0 Chronic gout, unspecified, without tophus (tophi): Secondary | ICD-10-CM | POA: Diagnosis present

## 2018-05-03 DIAGNOSIS — A419 Sepsis, unspecified organism: Secondary | ICD-10-CM | POA: Diagnosis not present

## 2018-05-03 DIAGNOSIS — R29898 Other symptoms and signs involving the musculoskeletal system: Secondary | ICD-10-CM | POA: Diagnosis not present

## 2018-05-03 DIAGNOSIS — Z79891 Long term (current) use of opiate analgesic: Secondary | ICD-10-CM

## 2018-05-03 DIAGNOSIS — Z833 Family history of diabetes mellitus: Secondary | ICD-10-CM

## 2018-05-03 DIAGNOSIS — R000 Tachycardia, unspecified: Secondary | ICD-10-CM | POA: Diagnosis not present

## 2018-05-03 DIAGNOSIS — I10 Essential (primary) hypertension: Secondary | ICD-10-CM | POA: Diagnosis present

## 2018-05-03 DIAGNOSIS — G2581 Restless legs syndrome: Secondary | ICD-10-CM | POA: Diagnosis present

## 2018-05-03 DIAGNOSIS — Z6841 Body Mass Index (BMI) 40.0 and over, adult: Secondary | ICD-10-CM

## 2018-05-03 DIAGNOSIS — Z7982 Long term (current) use of aspirin: Secondary | ICD-10-CM | POA: Diagnosis not present

## 2018-05-03 DIAGNOSIS — M25561 Pain in right knee: Secondary | ICD-10-CM | POA: Diagnosis not present

## 2018-05-03 DIAGNOSIS — M10061 Idiopathic gout, right knee: Secondary | ICD-10-CM | POA: Diagnosis not present

## 2018-05-03 DIAGNOSIS — M259 Joint disorder, unspecified: Secondary | ICD-10-CM | POA: Diagnosis present

## 2018-05-03 DIAGNOSIS — Z7952 Long term (current) use of systemic steroids: Secondary | ICD-10-CM | POA: Diagnosis not present

## 2018-05-03 DIAGNOSIS — R6510 Systemic inflammatory response syndrome (SIRS) of non-infectious origin without acute organ dysfunction: Secondary | ICD-10-CM

## 2018-05-03 DIAGNOSIS — I160 Hypertensive urgency: Secondary | ICD-10-CM | POA: Diagnosis present

## 2018-05-03 DIAGNOSIS — M79674 Pain in right toe(s): Secondary | ICD-10-CM | POA: Diagnosis not present

## 2018-05-03 DIAGNOSIS — Z8249 Family history of ischemic heart disease and other diseases of the circulatory system: Secondary | ICD-10-CM | POA: Diagnosis not present

## 2018-05-03 DIAGNOSIS — M7989 Other specified soft tissue disorders: Secondary | ICD-10-CM | POA: Diagnosis not present

## 2018-05-03 DIAGNOSIS — M109 Gout, unspecified: Secondary | ICD-10-CM | POA: Diagnosis present

## 2018-05-03 LAB — BASIC METABOLIC PANEL
Anion gap: 10 (ref 5–15)
BUN: 30 mg/dL — ABNORMAL HIGH (ref 6–20)
CO2: 26 mmol/L (ref 22–32)
Calcium: 9.3 mg/dL (ref 8.9–10.3)
Chloride: 97 mmol/L — ABNORMAL LOW (ref 98–111)
Creatinine, Ser: 1.23 mg/dL (ref 0.61–1.24)
GFR calc Af Amer: >60 mL/min (ref >60)
GFR calc non Af Amer: >60 mL/min (ref >60)
Glucose, Bld: 251 mg/dL — ABNORMAL HIGH (ref 70–99)
Potassium: 4 mmol/L (ref 3.5–5.1)
Sodium: 133 mmol/L — ABNORMAL LOW (ref 135–145)

## 2018-05-03 LAB — CBC WITH DIFFERENTIAL/PLATELET
Abs Immature Granulocytes: 0.08 10*3/uL — ABNORMAL HIGH (ref 0.00–0.07)
Basophils Absolute: 0 10*3/uL (ref 0.0–0.1)
Basophils Relative: 0 %
Eosinophils Absolute: 0.3 10*3/uL (ref 0.0–0.5)
Eosinophils Relative: 2 %
HCT: 41.1 % (ref 39.0–52.0)
Hemoglobin: 12.8 g/dL — ABNORMAL LOW (ref 13.0–17.0)
Immature Granulocytes: 0 %
Lymphocytes Relative: 11 %
Lymphs Abs: 2.2 10*3/uL (ref 0.7–4.0)
MCH: 24.4 pg — ABNORMAL LOW (ref 26.0–34.0)
MCHC: 31.1 g/dL (ref 30.0–36.0)
MCV: 78.4 fL — ABNORMAL LOW (ref 80.0–100.0)
Monocytes Absolute: 1.5 10*3/uL — ABNORMAL HIGH (ref 0.1–1.0)
Monocytes Relative: 8 %
Neutro Abs: 15.5 10*3/uL — ABNORMAL HIGH (ref 1.7–7.7)
Neutrophils Relative %: 79 %
Platelets: 243 10*3/uL (ref 150–400)
RBC: 5.24 MIL/uL (ref 4.22–5.81)
RDW: 15.3 % (ref 11.5–15.5)
WBC: 19.6 10*3/uL — ABNORMAL HIGH (ref 4.0–10.5)
nRBC: 0 % (ref 0.0–0.2)

## 2018-05-03 LAB — I-STAT CG4 LACTIC ACID, ED: Lactic Acid, Venous: 1.51 mmol/L (ref 0.5–1.9)

## 2018-05-03 MED ORDER — HYDROMORPHONE HCL 1 MG/ML IJ SOLN
0.5000 mg | Freq: Once | INTRAMUSCULAR | Status: DC
  Filled 2018-05-03: qty 1

## 2018-05-03 MED ORDER — ACETAMINOPHEN 325 MG PO TABS
650.0000 mg | ORAL_TABLET | Freq: Once | ORAL | Status: AC
  Administered 2018-05-03: 650 mg via ORAL
  Filled 2018-05-03: qty 2

## 2018-05-03 MED ORDER — PIPERACILLIN-TAZOBACTAM 3.375 G IVPB 30 MIN
3.3750 g | Freq: Once | INTRAVENOUS | Status: AC
  Administered 2018-05-03: 3.375 g via INTRAVENOUS
  Filled 2018-05-03 (×2): qty 50

## 2018-05-03 MED ORDER — ROPINIROLE HCL 1 MG PO TABS
4.0000 mg | ORAL_TABLET | Freq: Once | ORAL | Status: AC
  Administered 2018-05-04: 4 mg via ORAL
  Filled 2018-05-03: qty 4

## 2018-05-03 MED ORDER — HYDROMORPHONE HCL 1 MG/ML IJ SOLN
1.0000 mg | Freq: Once | INTRAMUSCULAR | Status: AC
  Administered 2018-05-03: 1 mg via INTRAVENOUS

## 2018-05-03 MED ORDER — HYDROMORPHONE HCL 1 MG/ML IJ SOLN
1.0000 mg | Freq: Once | INTRAMUSCULAR | Status: AC
  Administered 2018-05-03: 1 mg via INTRAVENOUS
  Filled 2018-05-03: qty 1

## 2018-05-03 MED ORDER — HYDROMORPHONE HCL 1 MG/ML IJ SOLN
INTRAMUSCULAR | Status: AC
  Filled 2018-05-03: qty 1

## 2018-05-03 MED ORDER — HYDROMORPHONE HCL 1 MG/ML IJ SOLN: 1.0000 mg | Freq: Once | INTRAMUSCULAR | Status: DC

## 2018-05-03 MED ORDER — SODIUM CHLORIDE 0.9 % IV BOLUS
500.0000 mL | Freq: Once | INTRAVENOUS | Status: AC
  Administered 2018-05-03: 500 mL via INTRAVENOUS

## 2018-05-03 MED ORDER — VANCOMYCIN HCL 10 G IV SOLR
2000.0000 mg | Freq: Once | INTRAVENOUS | Status: DC
  Filled 2018-05-03: qty 2000

## 2018-05-03 MED ORDER — SODIUM CHLORIDE 0.9 % IV SOLN
INTRAVENOUS | Status: DC | PRN
  Administered 2018-05-03: 250 mL via INTRAVENOUS

## 2018-05-03 NOTE — ED Notes (Signed)
Pt was given sprite and chicken broth at this time.
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
## 2018-05-03 NOTE — Telephone Encounter (Unsigned)
Copied from [place] [phone]. Topic: Quick Communication - See Telephone Encounter >> May 03, 2018  1:14 PM Ivar Drape wrote: CRM for notification. See Telephone encounter for: 05/03/18.

## 2018-05-03 NOTE — Progress Notes (Signed)
Pharmacy Antibiotic Note  Justin Hall is a 53 y.o. male admitted on 05/03/2018 with cellulitis.  Pharmacy has been consulted for vancomycin dosing. SCr 1.23 on admit. Also Zosyn ordered x 1 dose in the ER  Plan: Zosyn 3.375g IV (77min infusion) x1 Vancomycin 2g IV x 1; then Vancomycin 1000 mg IV Q 12 hrs. Goal AUC 400-550. Expected AUC: 479 SCr used: 1.23 Monitor clinical progress, c/s, renal function F/u de-escalation plan/LOT, vancomycin levels as indicated -f/u if zosyn needs to continue?   Height: 5\' 9"  (175.3 cm) Weight: 300 lb (136.1 kg) IBW/kg (Calculated) : 70.7  Temp (24hrs), Avg:100.2 F (37.9 C), Min:99.9 F (37.7 C), Max:100.4 F (38 C)  Recent Labs  Lab 05/03/18 1527  WBC 19.6*  CREATININE 1.23    Estimated Creatinine Clearance: 94.1 mL/min (by C-G formula based on SCr of 1.23 mg/dL).    No Known Allergies  Elicia Lamp, PharmD, BCPS Please check AMION for all [facility] contact numbers Clinical Pharmacist 05/03/2018 6:34 PM

## 2018-05-03 NOTE — ED Triage Notes (Signed)
Reports left toe pain and right knee pain.  Believes this is related to gout and began at 0400 this morning.

## 2018-05-03 NOTE — Plan of Care (Signed)
53 y.o. male being  admitted with cellulitis vs infected joint. Pharmacy has been consulted for vancomycin  Zosyn ordered x 1 dose in the ER  Left 1st toe swelling Tachycardic and tachypneic Fever 100.4 Had steroids last week  Spoke to Dr. Alvan Dame dates admit to medicine if infection suspected and reconsult orthopedics on  arrival  He is diabetic BG 251 No cough Flu is a possibility  Now HR 102 BP 175/121 tachypneic up to 20   Lactic acid WNL CXR - WNL ECG no ischemic   Accepted to stepdown bed Given tachypnea  7:49 PM Evelena Masci

## 2018-05-03 NOTE — Telephone Encounter (Signed)
Copied from [place] [phone]. Topic: Quick Communication - See Telephone Encounter >> May 03, 2018  1:14 PM Ivar Drape wrote: CRM for notification. See Telephone encounter for: 05/03/18. Patient is experiencing a Gout Flare Up and he has made an appt for Friday, 05/07/18 but he would like something called in for the flare up now until his appt, and he would like it sent to his preferred pharmacy [facility] on [address], [place] - [phone].  If any questions please call [phone]

## 2018-05-04 ENCOUNTER — Encounter (HOSPITAL_COMMUNITY): Payer: Self-pay | Admitting: Family Medicine

## 2018-05-04 DIAGNOSIS — A419 Sepsis, unspecified organism: Secondary | ICD-10-CM | POA: Diagnosis present

## 2018-05-04 DIAGNOSIS — E119 Type 2 diabetes mellitus without complications: Secondary | ICD-10-CM

## 2018-05-04 DIAGNOSIS — I160 Hypertensive urgency: Secondary | ICD-10-CM

## 2018-05-04 DIAGNOSIS — R6510 Systemic inflammatory response syndrome (SIRS) of non-infectious origin without acute organ dysfunction: Secondary | ICD-10-CM

## 2018-05-04 DIAGNOSIS — M10072 Idiopathic gout, left ankle and foot: Secondary | ICD-10-CM | POA: Diagnosis present

## 2018-05-04 DIAGNOSIS — M10061 Idiopathic gout, right knee: Secondary | ICD-10-CM | POA: Diagnosis present

## 2018-05-04 DIAGNOSIS — M259 Joint disorder, unspecified: Secondary | ICD-10-CM | POA: Diagnosis present

## 2018-05-04 DIAGNOSIS — I10 Essential (primary) hypertension: Secondary | ICD-10-CM

## 2018-05-04 DIAGNOSIS — M109 Gout, unspecified: Secondary | ICD-10-CM | POA: Diagnosis present

## 2018-05-04 DIAGNOSIS — Z7984 Long term (current) use of oral hypoglycemic drugs: Secondary | ICD-10-CM | POA: Diagnosis not present

## 2018-05-04 DIAGNOSIS — Z79899 Other long term (current) drug therapy: Secondary | ICD-10-CM | POA: Diagnosis not present

## 2018-05-04 DIAGNOSIS — Z6841 Body Mass Index (BMI) 40.0 and over, adult: Secondary | ICD-10-CM | POA: Diagnosis not present

## 2018-05-04 DIAGNOSIS — L03116 Cellulitis of left lower limb: Secondary | ICD-10-CM | POA: Diagnosis not present

## 2018-05-04 DIAGNOSIS — Z79891 Long term (current) use of opiate analgesic: Secondary | ICD-10-CM | POA: Diagnosis not present

## 2018-05-04 DIAGNOSIS — Z833 Family history of diabetes mellitus: Secondary | ICD-10-CM | POA: Diagnosis not present

## 2018-05-04 DIAGNOSIS — Z7952 Long term (current) use of systemic steroids: Secondary | ICD-10-CM | POA: Diagnosis not present

## 2018-05-04 DIAGNOSIS — G2581 Restless legs syndrome: Secondary | ICD-10-CM | POA: Diagnosis not present

## 2018-05-04 DIAGNOSIS — Z8249 Family history of ischemic heart disease and other diseases of the circulatory system: Secondary | ICD-10-CM | POA: Diagnosis not present

## 2018-05-04 DIAGNOSIS — Z7982 Long term (current) use of aspirin: Secondary | ICD-10-CM | POA: Diagnosis not present

## 2018-05-04 LAB — GLUCOSE, CAPILLARY
Glucose-Capillary: 230 mg/dL — ABNORMAL HIGH (ref 70–99)
Glucose-Capillary: 270 mg/dL — ABNORMAL HIGH (ref 70–99)
Glucose-Capillary: 222 mg/dL — ABNORMAL HIGH (ref 70–99)
Glucose-Capillary: 253 mg/dL — ABNORMAL HIGH (ref 70–99)
Glucose-Capillary: 468 mg/dL — ABNORMAL HIGH (ref 70–99)
Glucose-Capillary: 459 mg/dL — ABNORMAL HIGH (ref 70–99)
Glucose-Capillary: 422 mg/dL — ABNORMAL HIGH (ref 70–99)

## 2018-05-04 LAB — CBC WITH DIFFERENTIAL/PLATELET
Abs Immature Granulocytes: 0.06 10*3/uL (ref 0.00–0.07)
Basophils Absolute: 0 10*3/uL (ref 0.0–0.1)
Basophils Relative: 0 %
Eosinophils Absolute: 0.3 10*3/uL (ref 0.0–0.5)
Eosinophils Relative: 3 %
HCT: 36.1 % — ABNORMAL LOW (ref 39.0–52.0)
Hemoglobin: 11.5 g/dL — ABNORMAL LOW (ref 13.0–17.0)
Immature Granulocytes: 0 %
Lymphocytes Relative: 21 %
Lymphs Abs: 2.8 10*3/uL (ref 0.7–4.0)
MCH: 24.9 pg — ABNORMAL LOW (ref 26.0–34.0)
MCHC: 31.9 g/dL (ref 30.0–36.0)
MCV: 78.1 fL — ABNORMAL LOW (ref 80.0–100.0)
Monocytes Absolute: 1.3 10*3/uL — ABNORMAL HIGH (ref 0.1–1.0)
Monocytes Relative: 10 %
Neutro Abs: 8.9 10*3/uL — ABNORMAL HIGH (ref 1.7–7.7)
Neutrophils Relative %: 66 %
Platelets: 230 10*3/uL (ref 150–400)
RBC: 4.62 MIL/uL (ref 4.22–5.81)
RDW: 15.5 % (ref 11.5–15.5)
WBC: 13.5 10*3/uL — ABNORMAL HIGH (ref 4.0–10.5)
nRBC: 0 % (ref 0.0–0.2)

## 2018-05-04 LAB — BASIC METABOLIC PANEL
Anion gap: 9 (ref 5–15)
BUN: 27 mg/dL — ABNORMAL HIGH (ref 6–20)
CO2: 27 mmol/L (ref 22–32)
Calcium: 8.7 mg/dL — ABNORMAL LOW (ref 8.9–10.3)
Chloride: 99 mmol/L (ref 98–111)
Creatinine, Ser: 1.4 mg/dL — ABNORMAL HIGH (ref 0.61–1.24)
GFR calc Af Amer: >60 mL/min (ref >60)
GFR calc non Af Amer: 57 mL/min — ABNORMAL LOW (ref >60)
Glucose, Bld: 226 mg/dL — ABNORMAL HIGH (ref 70–99)
Potassium: 4.1 mmol/L (ref 3.5–5.1)
Sodium: 135 mmol/L (ref 135–145)

## 2018-05-04 LAB — MRSA PCR SCREENING: MRSA by PCR: NEGATIVE

## 2018-05-04 MED ORDER — VANCOMYCIN HCL IN DEXTROSE 1-5 GM/200ML-% IV SOLN: 1000.0000 mg | Freq: Two times a day (BID) | INTRAVENOUS | Status: DC

## 2018-05-04 MED ORDER — PREDNISONE 20 MG PO TABS
40.0000 mg | ORAL_TABLET | Freq: Every day | ORAL | Status: DC
  Administered 2018-05-04 – 2018-05-05 (×2): 40 mg via ORAL
  Filled 2018-05-04 (×2): qty 2

## 2018-05-04 MED ORDER — OXYCODONE-ACETAMINOPHEN 5-325 MG PO TABS: 1.0000 | ORAL_TABLET | ORAL | Status: DC | PRN

## 2018-05-04 MED ORDER — AMITRIPTYLINE HCL 25 MG PO TABS
25.0000 mg | ORAL_TABLET | Freq: Every day | ORAL | Status: DC
  Administered 2018-05-04: 25 mg via ORAL
  Filled 2018-05-04: qty 1

## 2018-05-04 MED ORDER — INDOMETHACIN 25 MG PO CAPS
50.0000 mg | ORAL_CAPSULE | Freq: Three times a day (TID) | ORAL | Status: DC
  Filled 2018-05-04 (×2): qty 2

## 2018-05-04 MED ORDER — LOSARTAN POTASSIUM 50 MG PO TABS
50.0000 mg | ORAL_TABLET | Freq: Every day | ORAL | Status: DC
  Administered 2018-05-05: 50 mg via ORAL
  Filled 2018-05-04: qty 1

## 2018-05-04 MED ORDER — INSULIN ASPART 100 UNIT/ML ~~LOC~~ SOLN
5.0000 [IU] | Freq: Once | SUBCUTANEOUS | Status: AC
  Administered 2018-05-04: 5 [IU] via SUBCUTANEOUS

## 2018-05-04 MED ORDER — ROPINIROLE HCL 1 MG PO TABS
4.0000 mg | ORAL_TABLET | Freq: Three times a day (TID) | ORAL | Status: DC
  Administered 2018-05-04 – 2018-05-05 (×4): 4 mg via ORAL
  Filled 2018-05-04 (×5): qty 4

## 2018-05-04 MED ORDER — INSULIN ASPART 100 UNIT/ML ~~LOC~~ SOLN
0.0000 [IU] | Freq: Every day | SUBCUTANEOUS | Status: DC
  Administered 2018-05-04: 5 [IU] via SUBCUTANEOUS

## 2018-05-04 MED ORDER — HYDROMORPHONE HCL 1 MG/ML IJ SOLN: 1.0000 mg | INTRAMUSCULAR | Status: DC | PRN

## 2018-05-04 MED ORDER — GLYBURIDE 5 MG PO TABS: 5.0000 mg | ORAL_TABLET | Freq: Every day | ORAL | Status: DC

## 2018-05-04 MED ORDER — ALLOPURINOL 300 MG PO TABS
300.0000 mg | ORAL_TABLET | Freq: Two times a day (BID) | ORAL | Status: DC
  Administered 2018-05-04: 300 mg via ORAL
  Filled 2018-05-04 (×3): qty 1

## 2018-05-04 MED ORDER — CEFAZOLIN SODIUM-DEXTROSE 2-4 GM/100ML-% IV SOLN
2.0000 g | Freq: Three times a day (TID) | INTRAVENOUS | Status: DC
  Administered 2018-05-04 – 2018-05-05 (×3): 2 g via INTRAVENOUS
  Filled 2018-05-04 (×4): qty 100

## 2018-05-04 MED ORDER — ZOLPIDEM TARTRATE 5 MG PO TABS
5.0000 mg | ORAL_TABLET | Freq: Every evening | ORAL | Status: DC | PRN
  Administered 2018-05-04: 5 mg via ORAL
  Filled 2018-05-04: qty 1

## 2018-05-04 MED ORDER — VANCOMYCIN HCL 10 G IV SOLR
2000.0000 mg | INTRAVENOUS | Status: AC
  Administered 2018-05-04: 2000 mg via INTRAVENOUS
  Filled 2018-05-04: qty 2000

## 2018-05-04 MED ORDER — HYDROCODONE-ACETAMINOPHEN 5-325 MG PO TABS
1.0000 | ORAL_TABLET | ORAL | Status: DC | PRN
  Administered 2018-05-04 (×3): 2 via ORAL
  Filled 2018-05-04 (×3): qty 2

## 2018-05-04 MED ORDER — POLYETHYLENE GLYCOL 3350 17 G PO PACK: 17.0000 g | PACK | Freq: Every day | ORAL | Status: DC | PRN

## 2018-05-04 MED ORDER — INSULIN ASPART 100 UNIT/ML ~~LOC~~ SOLN
0.0000 [IU] | Freq: Three times a day (TID) | SUBCUTANEOUS | Status: DC
  Administered 2018-05-04: 8 [IU] via SUBCUTANEOUS
  Administered 2018-05-04: 0 [IU] via SUBCUTANEOUS

## 2018-05-04 MED ORDER — HYDRALAZINE HCL 20 MG/ML IJ SOLN
10.0000 mg | INTRAMUSCULAR | Status: DC | PRN
  Administered 2018-05-04: 10 mg via INTRAVENOUS
  Filled 2018-05-04: qty 1

## 2018-05-04 MED ORDER — ONDANSETRON HCL 4 MG/2ML IJ SOLN: 4.0000 mg | Freq: Four times a day (QID) | INTRAMUSCULAR | Status: DC | PRN

## 2018-05-04 MED ORDER — ACETAMINOPHEN 325 MG PO TABS: 650.0000 mg | ORAL_TABLET | Freq: Four times a day (QID) | ORAL | Status: DC | PRN

## 2018-05-04 MED ORDER — METHYLPREDNISOLONE SODIUM SUCC 125 MG IJ SOLR
60.0000 mg | Freq: Once | INTRAMUSCULAR | Status: AC
  Administered 2018-05-04: 60 mg via INTRAVENOUS
  Filled 2018-05-04: qty 2

## 2018-05-04 MED ORDER — ENOXAPARIN SODIUM 40 MG/0.4ML ~~LOC~~ SOLN
40.0000 mg | SUBCUTANEOUS | Status: DC
  Administered 2018-05-04: 40 mg via SUBCUTANEOUS
  Filled 2018-05-04: qty 0.4

## 2018-05-04 MED ORDER — LISINOPRIL 20 MG PO TABS
20.0000 mg | ORAL_TABLET | Freq: Every day | ORAL | Status: DC
  Administered 2018-05-04: 20 mg via ORAL
  Filled 2018-05-04: qty 1

## 2018-05-04 MED ORDER — INSULIN ASPART 100 UNIT/ML ~~LOC~~ SOLN
0.0000 [IU] | Freq: Three times a day (TID) | SUBCUTANEOUS | Status: DC
  Administered 2018-05-04: 3 [IU] via SUBCUTANEOUS

## 2018-05-04 MED ORDER — ASPIRIN EC 81 MG PO TBEC
81.0000 mg | DELAYED_RELEASE_TABLET | Freq: Every morning | ORAL | Status: DC
  Administered 2018-05-04 – 2018-05-05 (×2): 81 mg via ORAL
  Filled 2018-05-04 (×2): qty 1

## 2018-05-04 MED ORDER — SODIUM CHLORIDE 0.9 % IV SOLN
INTRAVENOUS | Status: DC
  Administered 2018-05-04: 04:00:00 via INTRAVENOUS

## 2018-05-04 MED ORDER — ACETAMINOPHEN 650 MG RE SUPP: 650.0000 mg | Freq: Four times a day (QID) | RECTAL | Status: DC | PRN

## 2018-05-04 MED ORDER — ONDANSETRON HCL 4 MG PO TABS: 4.0000 mg | ORAL_TABLET | Freq: Four times a day (QID) | ORAL | Status: DC | PRN

## 2018-05-04 MED ORDER — AMOXICILLIN-POT CLAVULANATE 875-125 MG PO TABS
1.0000 | ORAL_TABLET | Freq: Two times a day (BID) | ORAL | Status: DC
  Administered 2018-05-04: 1 via ORAL
  Filled 2018-05-04: qty 1

## 2018-05-04 MED ORDER — GLYBURIDE 5 MG PO TABS
5.0000 mg | ORAL_TABLET | Freq: Two times a day (BID) | ORAL | Status: DC
  Administered 2018-05-04 – 2018-05-05 (×2): 5 mg via ORAL
  Filled 2018-05-04 (×3): qty 1

## 2018-05-04 MED ORDER — INSULIN ASPART 100 UNIT/ML ~~LOC~~ SOLN
0.0000 [IU] | Freq: Three times a day (TID) | SUBCUTANEOUS | Status: DC
  Administered 2018-05-05: 15 [IU] via SUBCUTANEOUS

## 2018-05-04 MED ORDER — HYDROMORPHONE HCL 2 MG PO TABS
4.0000 mg | ORAL_TABLET | Freq: Four times a day (QID) | ORAL | Status: DC | PRN
  Administered 2018-05-04: 4 mg via ORAL
  Filled 2018-05-04: qty 2

## 2018-05-04 MED ORDER — HYDROMORPHONE HCL 1 MG/ML IJ SOLN
1.0000 mg | Freq: Once | INTRAMUSCULAR | Status: AC
  Administered 2018-05-04: 1 mg via INTRAVENOUS
  Filled 2018-05-04: qty 1

## 2018-05-04 NOTE — Progress Notes (Signed)
Patient BP 161/102 at 0400. Patient asymptomatic.  10 mg hydralazine administered at 0452. Reassessed BP at 0621: BP 152/91.

## 2018-05-04 NOTE — Progress Notes (Signed)
Kiet Geer is a 53 y.o. male patient admitted from ED awake, alert - oriented  X 4 - no acute distress noted.  VSS - Blood pressure (!) 152/91, pulse 85, temperature 97.7 F (36.5 C), temperature source Oral, resp. rate 14, height 5\' 9"  (1.753 m), weight 136.1 kg, SpO2 98 %.    IV in place, occlusive dsg intact without redness.  Orientation to room, and floor completed with information packet given to patient/family.  Patient declined safety video at this time.  Admission INP armband ID verified with patient/family, and in place.    SR up x 2, fall assessment complete, with patient and family able to verbalize understanding of risk associated with falls, and verbalized understanding to call nsg before up out of bed.  Call light within reach, patient able to voice, and demonstrate understanding.    Skin, clean-dry- intact without evidence of bruising, or skin tears.   No evidence of skin break down noted on exam.     Will cont to eval and treat per MD orders.  Howard Pouch, RN 05/04/2018 8:04 AM

## 2018-05-04 NOTE — ED Notes (Signed)
[facility] notified [facility]) - patient is ready for transport

## 2018-05-04 NOTE — Telephone Encounter (Signed)
Pt was admitted to the hospital yesterday.

## 2018-05-04 NOTE — Progress Notes (Addendum)
Pharmacy Antibiotic Note  Justin Hall is a 53 y.o. male admitted on 05/03/2018 with cellulitis. He was started on abx for the gout vs infection of toe/knee. Zosyn has been change to Augmentin today. Will likely narrow or dc soon if no cellulitis. D/w with Dr. Wynelle Cleveland, ok to dc vanc. Due to her insurance coverage issue, we will keep her on IV abx now. Recommended IV cefazolin since his cellulitis is not draining. It usually means strep cellulitis. I'm going to use the high dose Ancef since he is morbidly obese.   Plan: Dc Augmentin Cefazolin 2g IV q8  Height: 5\' 9"  (175.3 cm) Weight: 300 lb (136.1 kg) IBW/kg (Calculated) : 70.7  Temp (24hrs), Avg:98.7 F (37.1 C), Min:97.4 F (36.3 C), Max:100.4 F (38 C)  Recent Labs  Lab 05/03/18 1527 05/03/18 1832 05/04/18 0355  WBC 19.6*  --  13.5*  CREATININE 1.23  --  1.40*  LATICACIDVEN  --  1.51  --     Estimated Creatinine Clearance: 82.7 mL/min (A) (by C-G formula based on SCr of 1.4 mg/dL (H)).    No Known Allergies  Onnie Boer, PharmD, BCIDP, AAHIVP, CPP Infectious Disease Pharmacist 05/04/2018 9:11 AM

## 2018-05-04 NOTE — Progress Notes (Signed)
CPAP set up at bedside for patient. Patient states he doesn't need assistance from RT. He will place himself on when ready. Informed patient to have RN to call RT if changes his mind.
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
## 2018-05-04 NOTE — Progress Notes (Addendum)
PROGRESS NOTE    Justin Hall   URK:[id number]  DOB: 03/27/1966  DOA: 05/03/2018 PCP: [name], DO   Brief Narrative:  Justin Hall is a 53 y.o. male with medical history significant for type 2 diabetes mellitus, hypertension, and chronic gout, now presenting to the emergency department for evaluation of severe pain involving the left first MTP and right knee.  Patient reports frequent gout flares and woke early in the morning of 05/03/2018 with excruciating pain involving the left first MTP - he later noted right knee pain  States that left first MTP is red and warm, similar to prior gout flares. He completed a course of steroid a little over a week ago for acute gout.     He was admitted earlier this AM by my colleague. I am writing a follow up note.  Subjective: Has pain in the left 1st toe which is throbbing and comes and goes- right knee hurts when he stands but is not swollen. Left ankle redness and swelling is a little better.     Assessment & Plan:   Principal Problem:   Sepsis - cellulitis fever 100-.4, tachycardia, leukocytosis - has redness and edema about his left ankle which appears to be cellulitis (coincidental in setting of gout flare?) - I will continue antibiotics- will change to Ancef   Active Problems:   Acute gout - left 1st toe quite red and tender- he has had gout flares in the toe before - given Indocin but he has not improved and CR is up a little today- he states steroids are the only thing that work - will give a dose of Solumedrol and start Prednisone tomorrow -  - would recommend HCTZ be discontinued as it can cause gout- have d/c'd Lisinopril/ HCTZ and started Losartan which can be beneficial in gout   - on Allopurinol at home  Mild rise in Cr - f/u tomorrow- cont ACE/ARB for now  Right knee pain - no swelling or erythema noted- tender on medial lower compartment of knee - ? If he injured it when compensating for left  foot    Hypertensive urgency - in ED- possibly due to pain - d/c Lisinopril/ HCTZ- start Losartan tomorrow- see above- follow BP to determine the right dose    DM2 (diabetes mellitus, type 2) - very painful to stand and walk- will keep hospitalized at least another 24 hrs - cont SSI- follow sugars as steroids have been started- Glucophage and Glyburide on hold- resume Glyburide  RLS- requip     Time spent in minutes: stable DVT prophylaxis: Lovenox Code Status: Full code Family Communication:  Disposition Plan: - very painful to stand and walk- will keep hospitalized at least another 24 hrs- cont IV antibiotics Consultants:   none Procedures:   none Antimicrobials:  Anti-infectives (From admission, onward)   Start     Dose/Rate Route Frequency Ordered Stop   05/04/18 1600  vancomycin (VANCOCIN) IVPB 1000 mg/200 mL premix  Status:  Discontinued     1,000 mg 200 mL/hr over 60 Minutes Intravenous Every 12 hours 05/04/18 0333 05/04/18 0915   05/04/18 1400  ceFAZolin (ANCEF) IVPB 2g/100 mL premix     2 g 200 mL/hr over 30 Minutes Intravenous Every 8 hours 05/04/18 1330     05/04/18 1000  amoxicillin-clavulanate (AUGMENTIN) 875-125 MG per tablet 1 tablet  Status:  Discontinued     1 tablet Oral Every 12 hours 05/04/18 0905 05/04/18 1329   05/04/18 0345  vancomycin (VANCOCIN) 2,000 mg in sodium chloride 0.9 % 500 mL IVPB     2,000 mg 250 mL/hr over 120 Minutes Intravenous STAT 05/04/18 0332 05/04/18 0615   05/03/18 1845  piperacillin-tazobactam (ZOSYN) IVPB 3.375 g     3.375 g 100 mL/hr over 30 Minutes Intravenous  Once 05/03/18 1830 05/03/18 2043   05/03/18 1845  vancomycin (VANCOCIN) 2,000 mg in sodium chloride 0.9 % 500 mL IVPB  Status:  Discontinued     2,000 mg 250 mL/hr over 120 Minutes Intravenous  Once 05/03/18 1836 05/04/18 0332       Objective: Vitals:   05/04/18 0621 05/04/18 0626 05/04/18 0910 05/04/18 1222  BP: (!) 152/91  132/89 133/79  Pulse: 85   88    Resp:   16 18  Temp: (!) 97.4 F (36.3 C) 97.7 F (36.5 C) 98 F (36.7 C) 98.2 F (36.8 C)  TempSrc: Oral Oral Oral Oral  SpO2: 98%  96% 97%  Weight:      Height:        Intake/Output Summary (Last 24 hours) at 05/04/2018 1442 Last data filed at 05/03/2018 2043 Gross per 24 hour  Intake 600 ml  Output -  Net 600 ml   Filed Weights   05/03/18 1447  Weight: 136.1 kg    Examination: General exam: Appears comfortable  HEENT: PERRLA, oral mucosa moist, no sclera icterus or thrush Respiratory system: Clear to auscultation. Respiratory effort normal. Cardiovascular system: S1 & S2 heard, RRR.   Gastrointestinal system: Abdomen soft, non-tender, nondistended. Normal bowel sounds. Central nervous system: Alert and oriented. No focal neurological deficits. Extremities: No cyanosis, clubbing- severe erythema and swelling of left 1st toe, erythema of left leg ending just above the ankle- present in circumferential manner and not connecting to the toe Tender in medial inferior compartment of right knee- no edema or erythema Skin: No rashes or ulcers Psychiatry:  Mood & affect appropriate.     Data Reviewed: I have personally reviewed following labs and imaging studies  CBC: Recent Labs  Lab 05/03/18 1527 05/04/18 0355  WBC 19.6* 13.5*  NEUTROABS 15.5* 8.9*  HGB 12.8* 11.5*  HCT 41.1 36.1*  MCV 78.4* 78.1*  PLT 243 151   Basic Metabolic Panel: Recent Labs  Lab 05/03/18 1527 05/04/18 0355  NA 133* 135  K 4.0 4.1  CL 97* 99  CO2 26 27  GLUCOSE 251* 226*  BUN 30* 27*  CREATININE 1.23 1.40*  CALCIUM 9.3 8.7*   GFR: Estimated Creatinine Clearance: 82.7 mL/min (A) (by C-G formula based on SCr of 1.4 mg/dL (H)). Liver Function Tests: No results for input(s): AST, ALT, ALKPHOS, BILITOT, PROT, ALBUMIN in the last 168 hours. No results for input(s): LIPASE, AMYLASE in the last 168 hours. No results for input(s): AMMONIA in the last 168 hours. Coagulation Profile: No  results for input(s): INR, PROTIME in the last 168 hours. Cardiac Enzymes: No results for input(s): CKTOTAL, CKMB, CKMBINDEX, TROPONINI in the last 168 hours. BNP (last 3 results) No results for input(s): PROBNP in the last 8760 hours. HbA1C: No results for input(s): HGBA1C in the last 72 hours. CBG: Recent Labs  Lab 05/04/18 0233 05/04/18 0624 05/04/18 0729 05/04/18 1152  GLUCAP 230* 270* 222* 253*   Lipid Profile: No results for input(s): CHOL, HDL, LDLCALC, TRIG, CHOLHDL, LDLDIRECT in the last 72 hours. Thyroid Function Tests: No results for input(s): TSH, T4TOTAL, FREET4, T3FREE, THYROIDAB in the last 72 hours. Anemia Panel: No results for input(s): VITAMINB12, FOLATE,  FERRITIN, TIBC, IRON, RETICCTPCT in the last 72 hours. Urine analysis:    Component Value Date/Time   COLORURINE STRAW (A) 10/12/2016 2353   APPEARANCEUR CLEAR 10/12/2016 2353   LABSPEC 1.015 10/12/2016 2353   PHURINE 6.0 10/12/2016 2353   GLUCOSEU >=500 (A) 10/12/2016 2353   HGBUR NEGATIVE 10/12/2016 2353   BILIRUBINUR NEGATIVE 10/12/2016 2353   KETONESUR NEGATIVE 10/12/2016 2353   PROTEINUR NEGATIVE 10/12/2016 2353   UROBILINOGEN 0.2 02/21/2014 0800   NITRITE NEGATIVE 10/12/2016 [place] 10/12/2016 2353   Sepsis Labs: @LABRCNTIP (procalcitonin:4,lacticidven:4) ) Recent Results (from the past 240 hour(s))  Culture, blood (routine x 2)     Status: None (Preliminary result)   Collection Time: 05/03/18  6:17 PM  Result Value Ref Range Status   Specimen Description   Final    BLOOD BLOOD RIGHT HAND Performed at [facility], [address] [place], [place] [address]    Special Requests   Final    BOTTLES DRAWN AEROBIC AND ANAEROBIC Blood Culture adequate volume Performed at [facility], [address] [place], [place] [address]    Culture   Final    NO GROWTH < 24 HOURS Performed at [facility], [place] [address], [place], [place] [address]     Report Status PENDING  Incomplete  Culture, blood (routine x 2)     Status: None (Preliminary result)   Collection Time: 05/03/18  6:23 PM  Result Value Ref Range Status   Specimen Description   Final    BLOOD RIGHT ANTECUBITAL Performed at [facility], [address] [place], [place] [address]    Special Requests   Final    BOTTLES DRAWN AEROBIC AND ANAEROBIC Blood Culture adequate volume Performed at [facility], [address] [place], [place] [address]    Culture   Final    NO GROWTH < 24 HOURS Performed at [facility], [place] [address], [place], [place] [address]    Report Status PENDING  Incomplete  MRSA PCR Screening     Status: None   Collection Time: 05/04/18  3:12 AM  Result Value Ref Range Status   MRSA by PCR NEGATIVE NEGATIVE Final    Comment:        The GeneXpert MRSA Assay (FDA approved for NASAL specimens only), is one component of a comprehensive MRSA colonization surveillance program. It is not intended to diagnose MRSA infection nor to guide or monitor treatment for MRSA infections. Performed at [facility], [place] [address], [place],  [address]          Radiology Studies: Dg Chest 2 View  Result Date: 05/03/2018 CLINICAL DATA:  Fever, tachycardia, hypertension EXAM: CHEST - 2 VIEW COMPARISON:  02/11/2018 FINDINGS: Lungs are clear. Mild elevation of the right hemidiaphragm. No pleural effusion or pneumothorax. Cardiomegaly. Degenerative changes of the visualized thoracolumbar spine. IMPRESSION: Normal chest radiographs. Electronically Signed   By: Julian Hy M.D.   On: 05/03/2018 19:12   Dg Ankle Complete Left  Result Date: 05/03/2018 CLINICAL DATA:  53 year old male with left ankle pain since this morning. Reports history of gout. EXAM: LEFT ANKLE COMPLETE - 3+ VIEW COMPARISON:  Left foot series today and 11/20/2017. FINDINGS: Bulky osteophytosis about the left ankle and subtalar joint. Bulky chronic  appearing ossific fragments at the medial and lateral malleolus. Preserved mortise joint alignment. No joint effusion is evident. Calcaneus appears intact. No acute  osseous abnormality identified. IMPRESSION: Advanced chronic degenerative and possibly posttraumatic changes at the left ankle. No acute osseous abnormality identified. Electronically Signed   By: Genevie Ann M.D.   On: 05/03/2018 17:14   Dg Knee Complete 4 Views Right  Result Date: 05/03/2018 CLINICAL DATA:  53 year old male with medial right knee pain since this morning with no known injury. Reports history of gout. EXAM: RIGHT KNEE - COMPLETE 4+ VIEW COMPARISON:  None. FINDINGS: Bone mineralization is within normal limits. Moderate medial compartment joint space loss and degenerative spurring. Mild to moderate lateral and patellofemoral compartment degenerative spurring. Small if any joint effusion. No acute osseous abnormality identified. No acute soft tissue finding. IMPRESSION: Age advanced degenerative changes at the right knee, maximal in the medial compartment. No acute osseous abnormality identified. Electronically Signed   By: Genevie Ann M.D.   On: 05/03/2018 17:12   Dg Foot Complete Left  Addendum Date: 05/03/2018   ADDENDUM REPORT: 05/03/2018 17:15 ADDENDUM: The clinical data should read: 53 year old male with LEFT great toe pain. Electronically Signed   By: Genevie Ann M.D.   On: 05/03/2018 17:15   Result Date: 05/03/2018 CLINICAL DATA:  53 year old male with right great toe pain and swelling. Reports history of gout. EXAM: LEFT FOOT - COMPLETE 3+ VIEW COMPARISON:  11/20/2017. FINDINGS: Calcified peripheral vascular disease is advanced. Chronic deformities of the 3rd and 4th metatarsals appear stable since 2019. Chronic 1st MTP joint degeneration and mild hallux valgus also appears stable. Chronic mild valgus subluxation at the 2nd and 3rd MTPs. Chronic degenerative changes at the ankle and subtalar joint with osteophytosis. No acute osseous  abnormality identified. No acute soft tissue finding IMPRESSION: 1. No acute osseous abnormality identified. Chronic posttraumatic and degenerative changes at the left foot and ankle appear stable since 2019. 2. Advanced calcified peripheral vascular disease. Electronically Signed: By: Genevie Ann M.D. On: 05/03/2018 17:11      Scheduled Meds: . allopurinol  300 mg Oral BID  . amitriptyline  25 mg Oral QHS  . aspirin EC  81 mg Oral q morning - 10a  . insulin aspart  0-15 Units Subcutaneous TID WC  . insulin aspart  0-5 Units Subcutaneous QHS  . lisinopril  20 mg Oral Daily  . predniSONE  40 mg Oral Q breakfast  . rOPINIRole  4 mg Oral TID   Continuous Infusions: . sodium chloride 250 mL (05/03/18 1902)  .  ceFAZolin (ANCEF) IV       LOS: 0 days      Debbe Odea, MD [facility] Pager: [email or web address] Password [email or web address] 05/04/2018, 2:42 PM

## 2018-05-05 DIAGNOSIS — A419 Sepsis, unspecified organism: Principal | ICD-10-CM

## 2018-05-05 DIAGNOSIS — E119 Type 2 diabetes mellitus without complications: Secondary | ICD-10-CM

## 2018-05-05 DIAGNOSIS — I160 Hypertensive urgency: Secondary | ICD-10-CM

## 2018-05-05 LAB — CBC
HCT: 36.2 % — ABNORMAL LOW (ref 39.0–52.0)
Hemoglobin: 11.5 g/dL — ABNORMAL LOW (ref 13.0–17.0)
MCH: 24.7 pg — ABNORMAL LOW (ref 26.0–34.0)
MCHC: 31.8 g/dL (ref 30.0–36.0)
MCV: 77.7 fL — ABNORMAL LOW (ref 80.0–100.0)
Platelets: 218 10*3/uL (ref 150–400)
RBC: 4.66 MIL/uL (ref 4.22–5.81)
RDW: 14.9 % (ref 11.5–15.5)
WBC: 15.3 10*3/uL — ABNORMAL HIGH (ref 4.0–10.5)
nRBC: 0 % (ref 0.0–0.2)

## 2018-05-05 LAB — BASIC METABOLIC PANEL WITH GFR
Anion gap: 9 (ref 5–15)
BUN: 31 mg/dL — ABNORMAL HIGH (ref 6–20)
CO2: 26 mmol/L (ref 22–32)
Calcium: 9 mg/dL (ref 8.9–10.3)
Chloride: 102 mmol/L (ref 98–111)
Creatinine, Ser: 1.23 mg/dL (ref 0.61–1.24)
GFR calc Af Amer: >60 mL/min
GFR calc non Af Amer: >60 mL/min
Glucose, Bld: 288 mg/dL — ABNORMAL HIGH (ref 70–99)
Potassium: 4.3 mmol/L (ref 3.5–5.1)
Sodium: 137 mmol/L (ref 135–145)

## 2018-05-05 LAB — GLUCOSE, CAPILLARY
Glucose-Capillary: 320 mg/dL — ABNORMAL HIGH (ref 70–99)
Glucose-Capillary: 302 mg/dL — ABNORMAL HIGH (ref 70–99)

## 2018-05-05 MED ORDER — METFORMIN HCL 500 MG PO TABS: 250.0000 mg | ORAL_TABLET | Freq: Every day | ORAL | Status: DC

## 2018-05-05 MED ORDER — DM-GUAIFENESIN ER 30-600 MG PO TB12: 1.0000 | ORAL_TABLET | Freq: Two times a day (BID) | ORAL | Status: DC | PRN

## 2018-05-05 MED ORDER — HYDROCODONE-ACETAMINOPHEN 5-325 MG PO TABS: 1.0000 | ORAL_TABLET | Freq: Four times a day (QID) | ORAL | 0 refills | Status: DC | PRN

## 2018-05-05 MED ORDER — LOSARTAN POTASSIUM 50 MG PO TABS: 50.0000 mg | ORAL_TABLET | Freq: Every day | ORAL | 0 refills | Status: DC

## 2018-05-05 MED ORDER — PREDNISONE 20 MG PO TABS: 40.0000 mg | ORAL_TABLET | Freq: Every day | ORAL | 0 refills | Status: AC

## 2018-05-05 MED ORDER — CEPHALEXIN 500 MG PO CAPS: 500.0000 mg | ORAL_CAPSULE | Freq: Three times a day (TID) | ORAL | 0 refills | Status: DC

## 2018-05-05 NOTE — Discharge Summary (Signed)
Physician Discharge Summary  Moses Odoherty ZOX:[id number] DOB: 1965/11/18 DOA: 05/03/2018  PCP: [name], DO  Admit date: 05/03/2018 Discharge date: 05/05/2018  Admitted From: Home Disposition:  Home  Recommendations for Outpatient Follow-up:  1. Follow up with PCP in 1 week 2. Follow-up in the ED if symptoms worsen or new appear   Home Health: No Equipment/Devices: None  Discharge Condition: Stable CODE STATUS: Full Diet recommendation: Heart Healthy / Carb Modified   Brief/Interim Summary: 53 year old male with history of diabetes mellitus type 2, hypertension and chronic presented with severe pain involving left first MTP and right knee.  He was admitted for acute gout flare and treated with intravenous Solu-Medrol.  He was also started on intravenous antibiotics for cellulitis.  His symptoms improved.  His pain has much improved and his cellulitis has improved as well.  He will be discharged on oral prednisone and oral Keflex.  Discharge Diagnoses:  Principal Problem:   Sepsis ([place]) Active Problems:   Hypertensive urgency   DM2 (diabetes mellitus, type 2) ([place])   Essential hypertension   Chronic gout without tophus   Acute gout  Sepsis secondary to cellulitis: Present on admission -Sepsis has resolved.  Hemodynamically stable.  Cellulitis is improving.  Antibiotic plan as below  Left ankle cellulitis -Treated with intravenous Ancef.  Erythema has almost resolved.  Will switch to oral Keflex to complete 5-day course of total antibiotic therapy.  Acute gout flare in a patient with history of chronic gout -Patient presented with left first toe tenderness/swelling and erythema along with right knee pain.  Treated with intravenous Solu-Medrol.  Symptoms have much improved.  Still has some left first toe swelling and tenderness.  Will discharge on prednisone 40 mg daily for 7 days.  Lisinopril/hydrochlorothiazide has been discontinued and losartan has been started  instead.  Continue losartan on discharge.  Continue allopurinol.   Leukocytosis -Improving  Mild rising creatinine -Improved.  Outpatient follow-up  Diabetes mellitus type 2 -Resume home regimen.  Outpatient follow-up  Hypertensive urgency -Possibly due to pain.  Blood pressure improved.  Antihypertensive plan as below  RLS -Continue Requip  Morbid obesity -Outpatient follow-up   Discharge Instructions  Discharge Instructions    Call MD for:  difficulty breathing, headache or visual disturbances   Complete by:  As directed    Call MD for:  extreme fatigue   Complete by:  As directed    Call MD for:  hives   Complete by:  As directed    Call MD for:  persistant dizziness or light-headedness   Complete by:  As directed    Call MD for:  persistant nausea and vomiting   Complete by:  As directed    Call MD for:  severe uncontrolled pain   Complete by:  As directed    Call MD for:  temperature >100.4   Complete by:  As directed    Diet - low sodium heart healthy   Complete by:  As directed    Diet Carb Modified   Complete by:  As directed    Increase activity slowly   Complete by:  As directed      Allergies as of 05/05/2018   No Known Allergies     Medication List    STOP taking these medications   amitriptyline 25 MG tablet Commonly known as:  ELAVIL   lisinopril-hydrochlorothiazide 20-25 MG tablet Commonly known as:  PRINZIDE,ZESTORETIC     TAKE these medications   allopurinol 300 MG tablet Commonly  known as:  ZYLOPRIM Take 1 tablet (300 mg total) by mouth 2 (two) times daily.   aspirin EC 81 MG tablet Take 81 mg by mouth every morning.   cephALEXin 500 MG capsule Commonly known as:  KEFLEX Take 1 capsule (500 mg total) by mouth 3 (three) times daily for 4 days.   dextromethorphan-guaiFENesin 30-600 MG 12hr tablet Commonly known as:  MUCINEX DM Take 1 tablet by mouth 2 (two) times daily as needed for cough.   glyBURIDE 5 MG tablet Commonly  known as:  DIABETA Take 1 tablet (5 mg total) by mouth daily with breakfast.   HYDROcodone-acetaminophen 5-325 MG tablet Commonly known as:  NORCO/VICODIN Take 1-2 tablets by mouth every 6 (six) hours as needed for severe pain. What changed:  reasons to take this   losartan 50 MG tablet Commonly known as:  COZAAR Take 1 tablet (50 mg total) by mouth daily.   metFORMIN 500 MG tablet Commonly known as:  GLUCOPHAGE Take 0.5 tablets (250 mg total) by mouth daily with breakfast.   predniSONE 20 MG tablet Commonly known as:  DELTASONE Take 2 tablets (40 mg total) by mouth daily for 7 days.   rOPINIRole 4 MG tablet Commonly known as:  REQUIP TAKE 1 TABLET BY MOUTH THREE TIMES DAILY      Follow-up Information    [name], DO. Schedule an appointment as soon as possible for a visit in 1 week(s).   Specialty:  Family Medicine Contact information: [place] [address] [phone]          No Known Allergies  Consultations:  None   Procedures/Studies: Dg Chest 2 View  Result Date: 05/03/2018 CLINICAL DATA:  Fever, tachycardia, hypertension EXAM: CHEST - 2 VIEW COMPARISON:  02/11/2018 FINDINGS: Lungs are clear. Mild elevation of the right hemidiaphragm. No pleural effusion or pneumothorax. Cardiomegaly. Degenerative changes of the visualized thoracolumbar spine. IMPRESSION: Normal chest radiographs. Electronically Signed   By: Julian Hy M.D.   On: 05/03/2018 19:12   Dg Ankle Complete Left  Result Date: 05/03/2018 CLINICAL DATA:  53 year old male with left ankle pain since this morning. Reports history of gout. EXAM: LEFT ANKLE COMPLETE - 3+ VIEW COMPARISON:  Left foot series today and 11/20/2017. FINDINGS: Bulky osteophytosis about the left ankle and subtalar joint. Bulky chronic appearing ossific fragments at the medial and lateral malleolus. Preserved mortise joint alignment. No joint effusion is evident. Calcaneus  appears intact. No acute osseous abnormality identified. IMPRESSION: Advanced chronic degenerative and possibly posttraumatic changes at the left ankle. No acute osseous abnormality identified. Electronically Signed   By: Genevie Ann M.D.   On: 05/03/2018 17:14   Dg Knee Complete 4 Views Right  Result Date: 05/03/2018 CLINICAL DATA:  53 year old male with medial right knee pain since this morning with no known injury. Reports history of gout. EXAM: RIGHT KNEE - COMPLETE 4+ VIEW COMPARISON:  None. FINDINGS: Bone mineralization is within normal limits. Moderate medial compartment joint space loss and degenerative spurring. Mild to moderate lateral and patellofemoral compartment degenerative spurring. Small if any joint effusion. No acute osseous abnormality identified. No acute soft tissue finding. IMPRESSION: Age advanced degenerative changes at the right knee, maximal in the medial compartment. No acute osseous abnormality identified. Electronically Signed   By: Genevie Ann M.D.   On: 05/03/2018 17:12   Dg Foot Complete Left  Addendum Date: 05/03/2018   ADDENDUM REPORT: 05/03/2018 17:15 ADDENDUM: The clinical data should read: 53 year old male  with LEFT great toe pain. Electronically Signed   By: Genevie Ann M.D.   On: 05/03/2018 17:15   Result Date: 05/03/2018 CLINICAL DATA:  53 year old male with right great toe pain and swelling. Reports history of gout. EXAM: LEFT FOOT - COMPLETE 3+ VIEW COMPARISON:  11/20/2017. FINDINGS: Calcified peripheral vascular disease is advanced. Chronic deformities of the 3rd and 4th metatarsals appear stable since 2019. Chronic 1st MTP joint degeneration and mild hallux valgus also appears stable. Chronic mild valgus subluxation at the 2nd and 3rd MTPs. Chronic degenerative changes at the ankle and subtalar joint with osteophytosis. No acute osseous abnormality identified. No acute soft tissue finding IMPRESSION: 1. No acute osseous abnormality identified. Chronic posttraumatic and  degenerative changes at the left foot and ankle appear stable since 2019. 2. Advanced calcified peripheral vascular disease. Electronically Signed: By: Genevie Ann M.D. On: 05/03/2018 17:11     Subjective: Patient seen and examined at bedside.  She feels much better.  His left foot pain and swelling is much improved.  His right knee pain is improved.  No overnight fever or vomiting.  Discharge Exam: Vitals:   05/04/18 2300 05/05/18 0555  BP:  (!) 156/92  Pulse:  88  Resp: 18 12  Temp:  (!) 97.5 F (36.4 C)  SpO2:  97%   Vitals:   05/04/18 1222 05/04/18 2036 05/04/18 2300 05/05/18 0555  BP: 133/79 (!) 158/75  (!) 156/92  Pulse: 88 98  88  Resp: 18 20 18 12   Temp: 98.2 F (36.8 C) 97.7 F (36.5 C)  (!) 97.5 F (36.4 C)  TempSrc: Oral   Oral  SpO2: 97% 97%  97%  Weight:      Height:        General: Pt is alert, awake, not in acute distress Cardiovascular: rate controlled, S1/S2 + Respiratory: bilateral decreased breath sounds at bases Abdominal: Soft, morbidly obese, NT, ND, bowel sounds + Extremities: Left first toe erythema with mild swelling and tenderness.  Left ankle erythema has improved.    The results of significant diagnostics from this hospitalization (including imaging, microbiology, ancillary and laboratory) are listed below for reference.     Microbiology: Recent Results (from the past 240 hour(s))  Culture, blood (routine x 2)     Status: None (Preliminary result)   Collection Time: 05/03/18  6:17 PM  Result Value Ref Range Status   Specimen Description   Final    BLOOD BLOOD RIGHT HAND Performed at [facility], [address] [place], [place] [address]    Special Requests   Final    BOTTLES DRAWN AEROBIC AND ANAEROBIC Blood Culture adequate volume Performed at [facility], [address] [place], [place] [address]    Culture   Final    NO GROWTH < 24 HOURS Performed at [facility], [place] [address], [place], [place]  [address]    Report Status PENDING  Incomplete  Culture, blood (routine x 2)     Status: None (Preliminary result)   Collection Time: 05/03/18  6:23 PM  Result Value Ref Range Status   Specimen Description   Final    BLOOD RIGHT ANTECUBITAL Performed at [facility], [address] [place], [place] [address]    Special Requests   Final    BOTTLES DRAWN AEROBIC AND ANAEROBIC Blood Culture adequate volume Performed at [facility], [address] [place], [place] [address]  Culture   Final    NO GROWTH < 24 HOURS Performed at [facility], [place] [address], [place], [place] [address]    Report Status PENDING  Incomplete  MRSA PCR Screening     Status: None   Collection Time: 05/04/18  3:12 AM  Result Value Ref Range Status   MRSA by PCR NEGATIVE NEGATIVE Final    Comment:        The GeneXpert MRSA Assay (FDA approved for NASAL specimens only), is one component of a comprehensive MRSA colonization surveillance program. It is not intended to diagnose MRSA infection nor to guide or monitor treatment for MRSA infections. Performed at [facility], [place] [address] [place], [place] [address]      Labs: BNP (last 3 results) No results for input(s): BNP in the last 8760 hours. Basic Metabolic Panel: Recent Labs  Lab 05/03/18 1527 05/04/18 0355 05/05/18 0359  NA 133* 135 137  K 4.0 4.1 4.3  CL 97* 99 102  CO2 26 27 26   GLUCOSE 251* 226* 288*  BUN 30* 27* 31*  CREATININE 1.23 1.40* 1.23  CALCIUM 9.3 8.7* 9.0   Liver Function Tests: No results for input(s): AST, ALT, ALKPHOS, BILITOT, PROT, ALBUMIN in the last 168 hours. No results for input(s): LIPASE, AMYLASE in the last 168 hours. No results for input(s): AMMONIA in the last 168 hours. CBC: Recent Labs  Lab 05/03/18 1527 05/04/18 0355 05/05/18 0359  WBC 19.6* 13.5* 15.3*  NEUTROABS 15.5* 8.9*  --   HGB 12.8* 11.5* 11.5*  HCT 41.1 36.1* 36.2*  MCV 78.4* 78.1* 77.7*  PLT 243  230 218   Cardiac Enzymes: No results for input(s): CKTOTAL, CKMB, CKMBINDEX, TROPONINI in the last 168 hours. BNP: Invalid input(s): POCBNP CBG: Recent Labs  Lab 05/04/18 1727 05/04/18 1730 05/04/18 2036 05/05/18 0107 05/05/18 0739  GLUCAP 468* 459* 422* 320* 302*   D-Dimer No results for input(s): DDIMER in the last 72 hours. Hgb A1c No results for input(s): HGBA1C in the last 72 hours. Lipid Profile No results for input(s): CHOL, HDL, LDLCALC, TRIG, CHOLHDL, LDLDIRECT in the last 72 hours. Thyroid function studies No results for input(s): TSH, T4TOTAL, T3FREE, THYROIDAB in the last 72 hours.  Invalid input(s): FREET3 Anemia work up No results for input(s): VITAMINB12, FOLATE, FERRITIN, TIBC, IRON, RETICCTPCT in the last 72 hours. Urinalysis    Component Value Date/Time   COLORURINE STRAW (A) 10/12/2016 2353   APPEARANCEUR CLEAR 10/12/2016 2353   LABSPEC 1.015 10/12/2016 2353   PHURINE 6.0 10/12/2016 2353   GLUCOSEU >=500 (A) 10/12/2016 2353   HGBUR NEGATIVE 10/12/2016 2353   BILIRUBINUR NEGATIVE 10/12/2016 2353   KETONESUR NEGATIVE 10/12/2016 2353   PROTEINUR NEGATIVE 10/12/2016 2353   UROBILINOGEN 0.2 02/21/2014 0800   NITRITE NEGATIVE 10/12/2016 2353   LEUKOCYTESUR NEGATIVE 10/12/2016 2353   Sepsis Labs Invalid input(s): PROCALCITONIN,  WBC,  LACTICIDVEN Microbiology Recent Results (from the past 240 hour(s))  Culture, blood (routine x 2)     Status: None (Preliminary result)   Collection Time: 05/03/18  6:17 PM  Result Value Ref Range Status   Specimen Description   Final    BLOOD BLOOD RIGHT HAND Performed at [facility], [address] [place], [place] [address]    Special Requests   Final    BOTTLES DRAWN AEROBIC AND ANAEROBIC Blood Culture adequate volume Performed at [facility], [address], [place],  [address]  Culture   Final    NO GROWTH < 24 HOURS Performed at [facility], [place] [address],  [place], [place] [address]    Report Status PENDING  Incomplete  Culture, blood (routine x 2)     Status: None (Preliminary result)   Collection Time: 05/03/18  6:23 PM  Result Value Ref Range Status   Specimen Description   Final    BLOOD RIGHT ANTECUBITAL Performed at [facility], [address] [place], [place] [address]    Special Requests   Final    BOTTLES DRAWN AEROBIC AND ANAEROBIC Blood Culture adequate volume Performed at [facility], [address] [place], [place] [address]    Culture   Final    NO GROWTH < 24 HOURS Performed at [facility], [place] [address], [place], [place] [address]    Report Status PENDING  Incomplete  MRSA PCR Screening     Status: None   Collection Time: 05/04/18  3:12 AM  Result Value Ref Range Status   MRSA by PCR NEGATIVE NEGATIVE Final    Comment:        The GeneXpert MRSA Assay (FDA approved for NASAL specimens only), is one component of a comprehensive MRSA colonization surveillance program. It is not intended to diagnose MRSA infection nor to guide or monitor treatment for MRSA infections. Performed at [facility], [place] [address], [place], [place] [address]      Time coordinating discharge: 35 minutes  SIGNED:   Aline August, MD  [facility] 05/05/2018, 9:02 AM Pager: [phone]  If 7PM-7AM, please contact night-coverage [email or web address] Password [email or web address]

## 2018-05-05 NOTE — Progress Notes (Signed)
Discharge instructions (including medications) discussed with and copy provided to patient/caregiver 

## 2018-05-05 NOTE — Progress Notes (Signed)
Patient refused Allopurinol 300mg    this shift. Patient reported Allopurinol will exacerbate gout flare up because uric acid is still elavated

## 2018-05-07 ENCOUNTER — Ambulatory Visit: Payer: 59 | Admitting: Family Medicine

## 2018-05-07 ENCOUNTER — Encounter: Payer: Self-pay | Admitting: Family Medicine

## 2018-05-07 VITALS — BP 140/88 | HR 94 | Temp 98.4°F | Ht 69.0 in | Wt 312.2 lb

## 2018-05-07 DIAGNOSIS — Z872 Personal history of diseases of the skin and subcutaneous tissue: Secondary | ICD-10-CM | POA: Diagnosis not present

## 2018-05-07 DIAGNOSIS — M1A9XX0 Chronic gout, unspecified, without tophus (tophi): Secondary | ICD-10-CM

## 2018-05-07 DIAGNOSIS — G4700 Insomnia, unspecified: Secondary | ICD-10-CM | POA: Diagnosis not present

## 2018-05-07 DIAGNOSIS — M109 Gout, unspecified: Secondary | ICD-10-CM

## 2018-05-07 MED ORDER — SUVOREXANT 10 MG PO TABS: 1.0000 | ORAL_TABLET | Freq: Every day | ORAL | 0 refills | Status: DC

## 2018-05-07 MED ORDER — HYDROCODONE-ACETAMINOPHEN 5-325 MG PO TABS: 1.0000 | ORAL_TABLET | Freq: Four times a day (QID) | ORAL | 0 refills | Status: DC | PRN

## 2018-05-07 MED ORDER — METFORMIN HCL 1000 MG PO TABS: 1000.0000 mg | ORAL_TABLET | Freq: Two times a day (BID) | ORAL | 3 refills | Status: DC

## 2018-05-07 MED ORDER — ROPINIROLE HCL 4 MG PO TABS: 4.0000 mg | ORAL_TABLET | Freq: Three times a day (TID) | ORAL | 2 refills | Status: DC
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
## 2018-05-07 NOTE — Patient Instructions (Addendum)
OK to go on losartan for a while instead of lisinopril.   Please consider counseling. Contact [phone] to schedule an appointment or inquire about cost/insurance coverage.  I have called in the correct dose of Metformin.  Keep the diet clean and stay active.  Foods to AVOID: Red meat, organ meat (liver), lunch meat, seafood (mussels, scallops, anchovies, etc) Alcohol Sugary foods/beverages (diet soft drinks have no link to flares)  Foods to migrate to: Dairy Vegetables Cherries have limited data to suggest they help lower uric acid levels (and prevent flares) Vit C (500 mg daily) may have a modest effect with preventing flares Poultry If you are going to eat red meat, beef and pork may give you less problems than lamb.   Let us know if you need anything.

## 2018-05-08 LAB — CULTURE, BLOOD (ROUTINE X 2)
Culture: NO GROWTH
Culture: NO GROWTH
Special Requests: ADEQUATE
Special Requests: ADEQUATE

## 2018-05-10 ENCOUNTER — Other Ambulatory Visit: Payer: Self-pay | Admitting: Family Medicine

## 2018-05-31 ENCOUNTER — Other Ambulatory Visit: Payer: Self-pay | Admitting: Family Medicine

## 2018-06-01 ENCOUNTER — Other Ambulatory Visit: Payer: Self-pay | Admitting: *Deleted

## 2018-06-01 MED ORDER — SUVOREXANT 10 MG PO TABS: 1.0000 | ORAL_TABLET | Freq: Every day | ORAL | 0 refills | Status: DC

## 2018-06-01 MED ORDER — HYDROCODONE-ACETAMINOPHEN 5-325 MG PO TABS: 1.0000 | ORAL_TABLET | Freq: Four times a day (QID) | ORAL | 0 refills | Status: DC | PRN

## 2018-06-01 NOTE — Patient Outreach (Signed)
[facility]) Care Management  06/01/2018  Justin Hall Aug 06, 1965 [id number]   Telephone Screening Date referral received: 05/26/18 Initial outreach: 06/01/18 Insurance: [employer]  Initial unsuccessful telephone call to patient's preferred number in order to complete transition of care assessment; no answer, not able to leave message as voice mail box for contact number has not been set up. Justin Hall was hospitalized at [facility] from 1/3-1/15/20 for sepsis secondary to left ankle cellulitis and acute gout flare. He was treated with IV antibiotics and IV steroids with adequate resolution of symptoms. He saw his primary care provider on 05/07/18.  Comorbidities include: Cellulitis, DM, HTN, morbid obesity and chronic gout. According to the electronic medical record, the patient's  most recent Hgb A1C value was 7.8% on 01/25/18 which was significantly improved from 10.5% on 06/02/17.   He was discharged to home on 1/15 20 without the need for home health services or durable medical equipment per the discharge summary.   Plan: This RNCM will attempt another outreach within 4 business days and will route unsuccessful outreach letter to [place] [facility] clinical pool to be mailed to patient's home address.  Barrington Ellison RN,CCM,CDE [place] Management Coordinator Office Phone [phone] Office Fax [phone]

## 2018-06-04 ENCOUNTER — Other Ambulatory Visit: Payer: Self-pay | Admitting: *Deleted

## 2018-06-04 ENCOUNTER — Telehealth: Payer: Self-pay | Admitting: Family Medicine

## 2018-06-04 MED ORDER — INDOMETHACIN 50 MG PO CAPS: 50.0000 mg | ORAL_CAPSULE | Freq: Two times a day (BID) | ORAL | 0 refills | Status: DC

## 2018-06-04 NOTE — Telephone Encounter (Signed)
Copied from [place]. Topic: Quick Communication - Rx Refill/Question >> Jun 04, 2018  3:40 PM Windy Kalata wrote: Medication: indomethacin (INDOCIN) 50 MG capsule [[id number]]  ENDED   Has the patient contacted their pharmacy? Yes.   (Agent: If no, request that the patient contact the pharmacy for the refill.) (Agent: If yes, when and what did the pharmacy advise?) Call office  Preferred Pharmacy (with phone number or street name): [name] at [place], [place] - [place] [phone] (Phone) [phone] (Fax)    Agent: Please be advised that RX refills may take up to 3 business days. We ask that you follow-up with your pharmacy.

## 2018-06-04 NOTE — Telephone Encounter (Signed)
Sent in as PCP approved. Patient informed

## 2018-06-04 NOTE — Telephone Encounter (Signed)
Patient is requesting a medication that is not on list and not prescribed in the past by PCP. Sent for review of request.

## 2018-06-04 NOTE — Patient Outreach (Signed)
[facility]) Care Management  06/04/2018  Justin Hall 1966-04-09 [id number]   Telephone Screening Transition of Care Date referral received: 05/26/18 Initial outreach: 06/01/18 Insurance: [employer] [place]  Second unsuccessful telephone call to patient's preferred number in order to complete transition of care assessment; no answer, not able to leave message as voice mail box for contact number has not been set up. Mr. Justin Hall was hospitalized at [facility] from 1/3-1/15/20 for sepsis secondary to left ankle cellulitis and acute gout flare. He was treated with IV antibiotics and IV steroids with adequate resolution of symptoms. He saw his primary care provider on 05/07/18.  Comorbidities include: Cellulitis, DM, HTN, morbid obesity and chronic gout. According to the electronic medical record, the patient's  most recent Hgb A1C value was 7.8% on 01/25/18 which was significantly improved from 10.5% on 06/02/17.   He was discharged to home on 1/15 20 without the need for home health services or durable medical equipment per the discharge summary.   Plan: This RNCM will attempt another outreach within 4 business days to complete the transition of care assessment.   Barrington Ellison RN,CCM,CDE [place] Management Coordinator Office Phone [phone] Office Fax [phone]

## 2018-06-07 ENCOUNTER — Encounter: Payer: Self-pay | Admitting: Family Medicine

## 2018-06-07 ENCOUNTER — Telehealth: Payer: Self-pay | Admitting: Family Medicine

## 2018-06-07 ENCOUNTER — Other Ambulatory Visit: Payer: Self-pay | Admitting: Family Medicine

## 2018-06-07 ENCOUNTER — Ambulatory Visit: Payer: 59 | Admitting: Family Medicine

## 2018-06-07 VITALS — BP 158/102 | HR 94 | Temp 98.0°F | Ht 69.0 in | Wt 312.4 lb

## 2018-06-07 DIAGNOSIS — I10 Essential (primary) hypertension: Secondary | ICD-10-CM | POA: Diagnosis not present

## 2018-06-07 DIAGNOSIS — M1A9XX0 Chronic gout, unspecified, without tophus (tophi): Secondary | ICD-10-CM | POA: Diagnosis not present

## 2018-06-07 LAB — URIC ACID: Uric Acid, Serum: 7.3 mg/dL (ref 4.0–7.8)

## 2018-06-07 MED ORDER — ROPINIROLE HCL 4 MG PO TABS: 4.0000 mg | ORAL_TABLET | Freq: Two times a day (BID) | ORAL | 2 refills | Status: DC

## 2018-06-07 MED ORDER — SUVOREXANT 10 MG PO TABS: 1.0000 | ORAL_TABLET | Freq: Every day | ORAL | 5 refills | Status: DC

## 2018-06-07 MED ORDER — HYDROCODONE-ACETAMINOPHEN 7.5-325 MG PO TABS: 1.0000 | ORAL_TABLET | Freq: Four times a day (QID) | ORAL | 0 refills | Status: DC | PRN

## 2018-06-07 MED ORDER — ALLOPURINOL 100 MG PO TABS: 100.0000 mg | ORAL_TABLET | Freq: Every day | ORAL | 6 refills | Status: DC

## 2018-06-07 NOTE — Telephone Encounter (Signed)
Copied from [place] [id number]. Topic: Quick Communication - Rx Refill/Question >> Jun 07, 2018  1:16 PM Margot Ables wrote: Medication: allopurinol (ZYLOPRIM) 100 MG tablet  - calling to clarify dosage and qty to dispense. Please call back or send new RX.  Has the patient contacted their pharmacy? yes Preferred Pharmacy (with phone number or street name): [name] at [place], [place] - [address] [phone] (Phone) [phone] (Fax)

## 2018-06-07 NOTE — Telephone Encounter (Signed)
Clarify as several instructions in prescription.

## 2018-06-07 NOTE — Telephone Encounter (Signed)
Called the pharmacy and getting only a busy signal

## 2018-06-07 NOTE — Telephone Encounter (Signed)
He already takes 300 mg twice daily.  I am adding another 100 mg to take in the morning.  He is to take a total of 400 mg in the morning and 300 mg in the evening.

## 2018-06-07 NOTE — Patient Instructions (Addendum)
Give [place] 2-3 business days to get the results of your labs back.   Keep the diet clean and stay active.  Bring your blood pressure monitor to your next appt. Continue checking intermittently at home.  Let us know if you need anything.
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
## 2018-06-09 ENCOUNTER — Other Ambulatory Visit: Payer: Self-pay | Admitting: *Deleted

## 2018-06-09 ENCOUNTER — Ambulatory Visit: Payer: Self-pay | Admitting: *Deleted

## 2018-06-09 NOTE — Patient Outreach (Signed)
Justin Hall Justin Hall) Care Management  06/09/2018  Justin Hall [birth year] [id number]   Telephone Screening Transition of Care Date referral received:05/26/18 Initial outreach:06/01/18 Insurance:[employer]  Subjective: Third unsuccessful telephone call to patient's preferred number in order to complete transition of care assessment; no answer,not able to leave message as voice mail box for contact number has not been set up.  Objective: Justin Hall hospitalized at [facility] from1/3-1/15/20 for sepsis secondary to left ankle cellulitis and acute gout flare. He was treated with IV antibiotics and IV steroids with adequate resolution of symptoms. He saw his primary care provider on 05/07/18.Comorbidities include: Cellulitis, DM, HTN, morbid obesity and chronic gout. According to the electronic medical record, the patient's most recent Hgb A1C value was 7.8% on 01/25/18 which was significantly improved from 10.5% on 06/02/17.  He was discharged to home on 1/1520without the need for home health services or durable medical equipmentper the discharge summary.  He saw his primary care provider on 06/07/18 for follow up of chronic gout and HTN. MD note states his blood pressure is elevated at 158/102 and he did not take his blood pressure medicine this morning although patient states he is usually adherent with taking his medications as prescribed.   and that he monitors his blood pressure at home. He was counseled on a low uric acid meal plan. His uric acid level was normal at 7.3. He was instructed to bring his blood pressure monitor to his next appointment.   Plan: If no return call from patient in response to unsuccessful outreach letter mailed to patient on 06/01/18, will close case to [place] Management services in 10 business days after initial outreach.   Barrington Ellison RN,CCM,CDE [place] Management Coordinator Office Phone  [phone] Office Fax [phone]

## 2018-06-09 NOTE — Telephone Encounter (Signed)
Pharmacy informed and had already been contacted///patient picked up prescription

## 2018-06-14 ENCOUNTER — Other Ambulatory Visit: Payer: Self-pay | Admitting: *Deleted

## 2018-06-14 NOTE — Patient Outreach (Signed)
[facility]) Care Management  06/14/2018  Justin Hall [birth year] [id number]   Case Closure- Unsuccessful Outreach Insurance: A[employer]na Referral received: 05/26/18 Initial outreach: 06/01/18   Unable to complete [employer] referral high risk screening;  no return call from patient after 3 attempts and no response to request to contact this RNCM in unsuccessful outreach letter.    Plan: Case closed to [place] Management services as it has been 10 business days since initial outreach attempt.   Barrington Ellison RN,CCM,CDE [place] Management Coordinator Office Phone [phone] Office Fax [phone]

## 2018-06-29 ENCOUNTER — Other Ambulatory Visit: Payer: Self-pay | Admitting: Family Medicine

## 2018-06-29 MED ORDER — HYDROCODONE-ACETAMINOPHEN 7.5-325 MG PO TABS: 1.0000 | ORAL_TABLET | Freq: Four times a day (QID) | ORAL | 0 refills | Status: DC | PRN

## 2018-07-09 ENCOUNTER — Other Ambulatory Visit: Payer: Self-pay | Admitting: Family Medicine

## 2018-07-09 NOTE — Telephone Encounter (Signed)
I just refilled this 10 days ago.

## 2018-07-13 ENCOUNTER — Telehealth: Payer: Self-pay | Admitting: Family Medicine

## 2018-07-13 NOTE — Telephone Encounter (Signed)
Patient called requesting hydrocodone refill.  He is going out of town for 2 weeks and will not have access to a pharmacy is why he is requesting early.  Unable to contact patient, he has refills at [facility] for [address]. Please contact patient.

## 2018-07-14 ENCOUNTER — Ambulatory Visit (INDEPENDENT_AMBULATORY_CARE_PROVIDER_SITE_OTHER): Payer: Self-pay | Admitting: Family Medicine

## 2018-07-14 ENCOUNTER — Other Ambulatory Visit: Payer: Self-pay

## 2018-07-14 ENCOUNTER — Encounter: Payer: Self-pay | Admitting: Family Medicine

## 2018-07-14 DIAGNOSIS — M1A9XX0 Chronic gout, unspecified, without tophus (tophi): Secondary | ICD-10-CM

## 2018-07-14 DIAGNOSIS — G2581 Restless legs syndrome: Secondary | ICD-10-CM

## 2018-07-14 DIAGNOSIS — F5104 Psychophysiologic insomnia: Secondary | ICD-10-CM

## 2018-07-14 MED ORDER — HYDROCODONE-ACETAMINOPHEN 7.5-325 MG PO TABS: 1.0000 | ORAL_TABLET | Freq: Four times a day (QID) | ORAL | 0 refills | Status: DC | PRN

## 2018-07-14 MED ORDER — SUVOREXANT 10 MG PO TABS: 1.0000 | ORAL_TABLET | Freq: Every day | ORAL | 5 refills | Status: DC

## 2018-07-14 MED ORDER — ROPINIROLE HCL 4 MG PO TABS: 4.0000 mg | ORAL_TABLET | Freq: Two times a day (BID) | ORAL | 2 refills | Status: DC

## 2018-07-14 NOTE — Telephone Encounter (Signed)
Called the patient and no answer//voice mail not set up.

## 2018-07-14 NOTE — Telephone Encounter (Signed)
Pt returning phone call.  Please call pt: [phone]

## 2018-07-14 NOTE — Telephone Encounter (Signed)
Corrected phone number is [phone]

## 2018-07-14 NOTE — Telephone Encounter (Signed)
Called the patient scheduled E Visit today at 1:15 email---[email or web address] [phone]

## 2018-07-14 NOTE — Progress Notes (Signed)
Virtual Visit via Telephone Note  I connected with Justin Hall on 07/14/18 at  3:00 PM EDT by telephone and verified that I am speaking with the correct person using two identifiers.   I discussed the limitations, risks, security and privacy concerns of performing an evaluation and management service by telephone and the availability of in person appointments. I also discussed with the patient that there may be a patient responsible charge related to this service. The patient expressed understanding and agreed to proceed.   History of Present Illness: Pt has a hx of gout for which he will intermittently take hydrocodone for in addition to RLS nad insomina. He takes Requip and Belsomra respectively. All conditions controlled. 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 He would like refills of these in case he needs to be there and is unable to leave.    Observations/Objective: No conversational dyspnea. Awake, alert Nml affect/mood Age appropriate judgment and insight  Assessment and Plan: Gout- Cont prn Hydrocodone RLS- cont Requip Insomnia- Cont Belsomra  Follow Up Instructions: F/u as originally scheduled   I discussed the assessment and treatment plan with the patient. The patient was provided an opportunity to ask questions and all were answered. The patient agreed with the plan and demonstrated an understanding of the instructions.   The patient was advised to call back or seek an in-person evaluation if the symptoms worsen or if the condition fails to improve as anticipated.  I provided 6 minutes of non-face-to-face time during this encounter.   [place], DO

## 2018-07-14 NOTE — Telephone Encounter (Signed)
If he is requiring so much, might need to do E visit when he returns. TY.

## 2018-07-16 ENCOUNTER — Telehealth: Payer: Self-pay

## 2018-07-16 NOTE — Telephone Encounter (Signed)
PA initiated via Covermymeds; KEY: [id number]. Awaiting determination.

## 2018-07-16 NOTE — Telephone Encounter (Signed)
PA approved. Effective from 07/16/2018 through 07/15/2019

## 2018-07-26 ENCOUNTER — Ambulatory Visit (INDEPENDENT_AMBULATORY_CARE_PROVIDER_SITE_OTHER): Payer: BLUE CROSS/BLUE SHIELD | Admitting: Family Medicine

## 2018-07-26 ENCOUNTER — Encounter: Payer: Self-pay | Admitting: Family Medicine

## 2018-07-26 ENCOUNTER — Other Ambulatory Visit: Payer: Self-pay

## 2018-07-26 VITALS — BP 132/100 | Temp 96.9°F | Ht 69.0 in | Wt 300.0 lb

## 2018-07-26 DIAGNOSIS — M109 Gout, unspecified: Secondary | ICD-10-CM

## 2018-07-26 MED ORDER — PREDNISONE 20 MG PO TABS: 40.0000 mg | ORAL_TABLET | Freq: Every day | ORAL | 0 refills | Status: AC

## 2018-07-26 MED ORDER — INDOMETHACIN 50 MG PO CAPS: ORAL_CAPSULE | ORAL | 2 refills | Status: DC

## 2018-07-26 NOTE — Progress Notes (Signed)
Virtual Visit via Video Note  I connected with Justin Hall on 07/26/18 at  8:45 AM EDT by a video enabled telemedicine application and verified that I am speaking with the correct person using two identifiers.   I discussed the limitations of evaluation and management by telemedicine and the availability of in person appointments. The patient expressed understanding and agreed to proceed.  History of Present Illness: Gout flare for several days. Requesting prednisone. Also requesting indomethacin. No diet changes, denies fevers. +redness, warmth and pain. Takes allopurinol. Last urate 7.3 almost 2 mo ago.    Observations/Objective: No conversational dyspnea Age appropriate judgment and insight Nml affect and mood  Assessment and Plan: Acute gout of knee, unspecified cause, unspecified laterality - Plan: predniSONE (DELTASONE) 20 MG tablet, indomethacin (INDOCIN) 50 MG capsule, Uric acid  Pred for this attack. Indomethacin for future issues. Ck urate.  He also mentioned his Belsomra is very expensive due to ins change. PA was done, will double check to see if it was expensive after PA. If so, will change to low dose doxepin.  Follow Up Instructions: Uric acid at earliest convenience   I discussed the assessment and treatment plan with the patient. The patient was provided an opportunity to ask questions and all were answered. The patient agreed with the plan and demonstrated an understanding of the instructions.   The patient was advised to call back or seek an in-person evaluation if the symptoms worsen or if the condition fails to improve as anticipated.  [place], DO

## 2018-07-30 ENCOUNTER — Other Ambulatory Visit: Payer: Self-pay | Admitting: Family Medicine

## 2018-08-02 MED ORDER — HYDROCODONE-ACETAMINOPHEN 7.5-325 MG PO TABS: 1.0000 | ORAL_TABLET | Freq: Four times a day (QID) | ORAL | 0 refills | Status: DC | PRN

## 2018-08-18 ENCOUNTER — Other Ambulatory Visit: Payer: Self-pay | Admitting: Family Medicine

## 2018-08-18 ENCOUNTER — Encounter: Payer: Self-pay | Admitting: Family Medicine

## 2018-08-18 DIAGNOSIS — M25562 Pain in left knee: Secondary | ICD-10-CM

## 2018-08-18 DIAGNOSIS — G8929 Other chronic pain: Secondary | ICD-10-CM

## 2018-08-18 MED ORDER — DOXEPIN HCL 10 MG PO CAPS: 10.0000 mg | ORAL_CAPSULE | Freq: Every evening | ORAL | 1 refills | Status: DC | PRN

## 2018-08-25 ENCOUNTER — Other Ambulatory Visit: Payer: Self-pay | Admitting: Family Medicine

## 2018-08-25 DIAGNOSIS — G4700 Insomnia, unspecified: Secondary | ICD-10-CM

## 2018-08-25 MED ORDER — MIRTAZAPINE 30 MG PO TABS: 30.0000 mg | ORAL_TABLET | Freq: Every day | ORAL | 1 refills | Status: DC

## 2018-08-25 NOTE — Progress Notes (Signed)
Due to continued insomnia, will trial Mirtazapine and refer to sleep team. For pain, will refer to ortho for his knee if he is OK with it.

## 2018-08-26 ENCOUNTER — Other Ambulatory Visit: Payer: Self-pay | Admitting: Family Medicine

## 2018-08-26 NOTE — Telephone Encounter (Signed)
Copied from [place] [id number]. Topic: Quick Communication - Rx Refill/Question >> Aug 26, 2018  7:20 AM Scherrie Gerlach wrote: Medication: HYDROcodone-acetaminophen ([place]) 7.5-325 MG tablet Pt states he has gout really bad and arthritis and requesting refill  [name] at [place], [place] [phone] (Phone) [phone] (Fax)

## 2018-08-27 MED ORDER — HYDROCODONE-ACETAMINOPHEN 7.5-325 MG PO TABS: 1.0000 | ORAL_TABLET | Freq: Four times a day (QID) | ORAL | 0 refills | Status: DC | PRN

## 2018-08-27 NOTE — Telephone Encounter (Signed)
Refilled on 08/27/2018

## 2018-09-01 ENCOUNTER — Encounter (HOSPITAL_BASED_OUTPATIENT_CLINIC_OR_DEPARTMENT_OTHER): Payer: Self-pay | Admitting: Emergency Medicine

## 2018-09-01 ENCOUNTER — Emergency Department (HOSPITAL_BASED_OUTPATIENT_CLINIC_OR_DEPARTMENT_OTHER)
Admission: EM | Admit: 2018-09-01 | Discharge: 2018-09-01 | Disposition: A | Discharge status: Home / Self Care | Payer: Self-pay | Attending: Emergency Medicine | Admitting: Emergency Medicine

## 2018-09-01 ENCOUNTER — Emergency Department (HOSPITAL_BASED_OUTPATIENT_CLINIC_OR_DEPARTMENT_OTHER): Payer: Self-pay

## 2018-09-01 ENCOUNTER — Other Ambulatory Visit: Payer: Self-pay

## 2018-09-01 DIAGNOSIS — J069 Acute upper respiratory infection, unspecified: Secondary | ICD-10-CM | POA: Insufficient documentation

## 2018-09-01 DIAGNOSIS — Y929 Unspecified place or not applicable: Secondary | ICD-10-CM | POA: Insufficient documentation

## 2018-09-01 DIAGNOSIS — Z7982 Long term (current) use of aspirin: Secondary | ICD-10-CM | POA: Insufficient documentation

## 2018-09-01 DIAGNOSIS — E119 Type 2 diabetes mellitus without complications: Secondary | ICD-10-CM | POA: Insufficient documentation

## 2018-09-01 DIAGNOSIS — Z79899 Other long term (current) drug therapy: Secondary | ICD-10-CM | POA: Insufficient documentation

## 2018-09-01 DIAGNOSIS — Y939 Activity, unspecified: Secondary | ICD-10-CM | POA: Insufficient documentation

## 2018-09-01 DIAGNOSIS — S20212A Contusion of left front wall of thorax, initial encounter: Secondary | ICD-10-CM | POA: Insufficient documentation

## 2018-09-01 DIAGNOSIS — W19XXXA Unspecified fall, initial encounter: Secondary | ICD-10-CM | POA: Insufficient documentation

## 2018-09-01 DIAGNOSIS — Y999 Unspecified external cause status: Secondary | ICD-10-CM | POA: Insufficient documentation

## 2018-09-01 DIAGNOSIS — I10 Essential (primary) hypertension: Secondary | ICD-10-CM | POA: Insufficient documentation

## 2018-09-01 DIAGNOSIS — Z7984 Long term (current) use of oral hypoglycemic drugs: Secondary | ICD-10-CM | POA: Insufficient documentation

## 2018-09-01 MED ORDER — METHOCARBAMOL 500 MG PO TABS: 500.0000 mg | ORAL_TABLET | Freq: Three times a day (TID) | ORAL | 0 refills | Status: DC | PRN

## 2018-09-01 MED ORDER — ETODOLAC 400 MG PO TABS: 400.0000 mg | ORAL_TABLET | Freq: Two times a day (BID) | ORAL | 0 refills | Status: DC | PRN

## 2018-09-01 MED ORDER — OXYCODONE-ACETAMINOPHEN 5-325 MG PO TABS
1.0000 | ORAL_TABLET | Freq: Once | ORAL | Status: AC
  Administered 2018-09-01: 1 via ORAL
  Filled 2018-09-01: qty 1

## 2018-09-01 MED ORDER — IBUPROFEN 800 MG PO TABS
800.0000 mg | ORAL_TABLET | Freq: Once | ORAL | Status: AC
  Administered 2018-09-01: 800 mg via ORAL
  Filled 2018-09-01: qty 1

## 2018-09-01 NOTE — ED Triage Notes (Signed)
Pt states he fell 2 days ago and he has increase left rib cage pain 6/10 with SOB, pt states he has gout problems, sore throat and not feeling good. Pt denies traveling out of town or been close to someone with COVID-19, denies any change on taste or smell.
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
## 2018-09-08 ENCOUNTER — Encounter: Payer: Self-pay | Admitting: Family Medicine

## 2018-09-09 ENCOUNTER — Other Ambulatory Visit: Payer: Self-pay | Admitting: Family Medicine

## 2018-09-09 MED ORDER — PREDNISONE 20 MG PO TABS: 40.0000 mg | ORAL_TABLET | Freq: Every day | ORAL | 0 refills | Status: DC

## 2018-09-09 MED ORDER — CLONAZEPAM 1 MG PO TABS: 1.0000 mg | ORAL_TABLET | Freq: Every day | ORAL | 0 refills | Status: DC | PRN

## 2018-09-09 NOTE — Progress Notes (Signed)
Pred and Klonopin sent in, want pt to have appt though. Ty.

## 2018-09-10 NOTE — Progress Notes (Signed)
Patients number no answer/voice mail not set up. Called his wifes number left message

## 2018-09-14 ENCOUNTER — Emergency Department (HOSPITAL_BASED_OUTPATIENT_CLINIC_OR_DEPARTMENT_OTHER): Payer: Self-pay

## 2018-09-14 ENCOUNTER — Emergency Department (HOSPITAL_BASED_OUTPATIENT_CLINIC_OR_DEPARTMENT_OTHER)
Admission: EM | Admit: 2018-09-14 | Discharge: 2018-09-14 | Disposition: A | Discharge status: Home / Self Care | Payer: Self-pay | Attending: Emergency Medicine | Admitting: Emergency Medicine

## 2018-09-14 ENCOUNTER — Ambulatory Visit: Payer: Self-pay | Admitting: Orthopaedic Surgery

## 2018-09-14 ENCOUNTER — Other Ambulatory Visit: Payer: Self-pay

## 2018-09-14 ENCOUNTER — Encounter (HOSPITAL_BASED_OUTPATIENT_CLINIC_OR_DEPARTMENT_OTHER): Payer: Self-pay

## 2018-09-14 DIAGNOSIS — Z79899 Other long term (current) drug therapy: Secondary | ICD-10-CM | POA: Insufficient documentation

## 2018-09-14 DIAGNOSIS — M25552 Pain in left hip: Secondary | ICD-10-CM | POA: Insufficient documentation

## 2018-09-14 DIAGNOSIS — E119 Type 2 diabetes mellitus without complications: Secondary | ICD-10-CM | POA: Insufficient documentation

## 2018-09-14 DIAGNOSIS — Z7982 Long term (current) use of aspirin: Secondary | ICD-10-CM | POA: Insufficient documentation

## 2018-09-14 DIAGNOSIS — Z7984 Long term (current) use of oral hypoglycemic drugs: Secondary | ICD-10-CM | POA: Insufficient documentation

## 2018-09-14 DIAGNOSIS — I10 Essential (primary) hypertension: Secondary | ICD-10-CM | POA: Insufficient documentation

## 2018-09-14 DIAGNOSIS — D168 Benign neoplasm of pelvic bones, sacrum and coccyx: Secondary | ICD-10-CM | POA: Insufficient documentation

## 2018-09-14 DIAGNOSIS — D169 Benign neoplasm of bone and articular cartilage, unspecified: Secondary | ICD-10-CM

## 2018-09-14 MED ORDER — PREDNISONE 50 MG PO TABS: 50.0000 mg | ORAL_TABLET | Freq: Every day | ORAL | 0 refills | Status: AC

## 2018-09-14 MED ORDER — METHYLPREDNISOLONE SODIUM SUCC 125 MG IJ SOLR
125.0000 mg | Freq: Once | INTRAMUSCULAR | Status: AC
  Administered 2018-09-14: 125 mg via INTRAMUSCULAR
  Filled 2018-09-14: qty 2

## 2018-09-14 MED ORDER — HYDROMORPHONE HCL 1 MG/ML IJ SOLN
1.0000 mg | Freq: Once | INTRAMUSCULAR | Status: AC
  Administered 2018-09-14: 1 mg via INTRAMUSCULAR
  Filled 2018-09-14: qty 1

## 2018-09-14 NOTE — ED Triage Notes (Signed)
Pt c/o left groin pain "gout"-started yesterday-to tx area in w/c

## 2018-09-14 NOTE — Progress Notes (Signed)
Called again and unable to get on the phone, No voicemail set up

## 2018-09-14 NOTE — ED Notes (Signed)
During triage pt drowsy-asked if he had taken any drugs or ETOH use-denies both-stating he had taken rx med x 3 PTA-pt nearly fell asleep and swayed forward while seated on stretcher-pt assisted with undress by male EMT

## 2018-09-14 NOTE — Progress Notes (Signed)
Called the patient again/ no answer. Sent the patient a mychart message regarding refill//and the need to schedule an appt.
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
## 2018-09-14 NOTE — Discharge Instructions (Addendum)
It is very important that you call Dr. Irene Limbo office to make a follow-up appointment. Take the prednisone as prescribed. Continue taking home medications as prescribed. Return to the ER with any new, worsening, or concerning symptoms.

## 2018-10-05 ENCOUNTER — Other Ambulatory Visit: Payer: Self-pay | Admitting: Family Medicine

## 2018-10-05 MED ORDER — HYDROCODONE-ACETAMINOPHEN 7.5-325 MG PO TABS: 1.0000 | ORAL_TABLET | Freq: Four times a day (QID) | ORAL | 0 refills | Status: DC | PRN

## 2018-10-05 MED ORDER — CLONAZEPAM 1 MG PO TABS: 1.0000 mg | ORAL_TABLET | Freq: Every day | ORAL | 0 refills | Status: DC | PRN

## 2018-10-05 NOTE — Telephone Encounter (Signed)
I see the referral for sleep specialists are pending, maybe call them to make sure they are available in July or even earlier. What is the status of the ortho referral, it said he no-showed?

## 2018-10-05 NOTE — Telephone Encounter (Signed)
Regarding the sleep test---they did try to schedule him but in [place] as not sending pulomary MD's to the [place] until July and he does not want to drive to [place], they will schedule in July for that. Ortho--appt update--they called him several times and finally got him, made an appt then he no showed.

## 2018-10-06 NOTE — Telephone Encounter (Signed)
He had only [facility] and the pulmonary would not work, needs to wait until July The ortho no showed because of insurance Currently he has no insurance Scheduled appt for Friday 10/08/2018 at 2:45  He will have to be cash only and will check into the cost////may need to cancel if too expensive

## 2018-10-08 ENCOUNTER — Ambulatory Visit: Payer: Self-pay | Admitting: Family Medicine

## 2018-10-08 ENCOUNTER — Other Ambulatory Visit: Payer: Self-pay

## 2018-10-11 ENCOUNTER — Encounter: Payer: Self-pay | Admitting: Family Medicine

## 2018-10-11 ENCOUNTER — Ambulatory Visit (INDEPENDENT_AMBULATORY_CARE_PROVIDER_SITE_OTHER): Payer: Self-pay | Admitting: Family Medicine

## 2018-10-11 ENCOUNTER — Other Ambulatory Visit: Payer: Self-pay

## 2018-10-11 DIAGNOSIS — R0609 Other forms of dyspnea: Secondary | ICD-10-CM

## 2018-10-11 DIAGNOSIS — M7989 Other specified soft tissue disorders: Secondary | ICD-10-CM

## 2018-10-11 DIAGNOSIS — R0600 Dyspnea, unspecified: Secondary | ICD-10-CM

## 2018-10-11 MED ORDER — FUROSEMIDE 40 MG PO TABS: 40.0000 mg | ORAL_TABLET | Freq: Every day | ORAL | 3 refills | Status: DC

## 2018-10-11 MED ORDER — POTASSIUM CHLORIDE CRYS ER 10 MEQ PO TBCR: EXTENDED_RELEASE_TABLET | ORAL | 3 refills | Status: DC
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
## 2018-10-17 ENCOUNTER — Encounter (HOSPITAL_BASED_OUTPATIENT_CLINIC_OR_DEPARTMENT_OTHER): Payer: Self-pay | Admitting: Emergency Medicine

## 2018-10-17 ENCOUNTER — Other Ambulatory Visit: Payer: Self-pay

## 2018-10-17 ENCOUNTER — Emergency Department (HOSPITAL_BASED_OUTPATIENT_CLINIC_OR_DEPARTMENT_OTHER): Payer: Self-pay

## 2018-10-17 ENCOUNTER — Inpatient Hospital Stay (HOSPITAL_BASED_OUTPATIENT_CLINIC_OR_DEPARTMENT_OTHER)
Admission: EM | Admit: 2018-10-17 | Discharge: 2018-10-21 | DRG: 291 | Disposition: A | Discharge status: Home / Self Care | Payer: Self-pay | Attending: Internal Medicine | Admitting: Internal Medicine

## 2018-10-17 ENCOUNTER — Observation Stay (HOSPITAL_BASED_OUTPATIENT_CLINIC_OR_DEPARTMENT_OTHER): Payer: Self-pay

## 2018-10-17 DIAGNOSIS — G2581 Restless legs syndrome: Secondary | ICD-10-CM | POA: Diagnosis present

## 2018-10-17 DIAGNOSIS — I2510 Atherosclerotic heart disease of native coronary artery without angina pectoris: Secondary | ICD-10-CM | POA: Diagnosis present

## 2018-10-17 DIAGNOSIS — D509 Iron deficiency anemia, unspecified: Secondary | ICD-10-CM | POA: Diagnosis present

## 2018-10-17 DIAGNOSIS — Z6841 Body Mass Index (BMI) 40.0 and over, adult: Secondary | ICD-10-CM

## 2018-10-17 DIAGNOSIS — E785 Hyperlipidemia, unspecified: Secondary | ICD-10-CM | POA: Diagnosis present

## 2018-10-17 DIAGNOSIS — Z79899 Other long term (current) drug therapy: Secondary | ICD-10-CM

## 2018-10-17 DIAGNOSIS — I5189 Other ill-defined heart diseases: Secondary | ICD-10-CM

## 2018-10-17 DIAGNOSIS — Z20828 Contact with and (suspected) exposure to other viral communicable diseases: Secondary | ICD-10-CM | POA: Diagnosis present

## 2018-10-17 DIAGNOSIS — I5033 Acute on chronic diastolic (congestive) heart failure: Secondary | ICD-10-CM | POA: Diagnosis present

## 2018-10-17 DIAGNOSIS — G4733 Obstructive sleep apnea (adult) (pediatric): Secondary | ICD-10-CM | POA: Diagnosis present

## 2018-10-17 DIAGNOSIS — N179 Acute kidney failure, unspecified: Secondary | ICD-10-CM | POA: Diagnosis present

## 2018-10-17 DIAGNOSIS — I517 Cardiomegaly: Secondary | ICD-10-CM

## 2018-10-17 DIAGNOSIS — M10052 Idiopathic gout, left hip: Secondary | ICD-10-CM | POA: Diagnosis present

## 2018-10-17 DIAGNOSIS — F419 Anxiety disorder, unspecified: Secondary | ICD-10-CM | POA: Diagnosis present

## 2018-10-17 DIAGNOSIS — I10 Essential (primary) hypertension: Secondary | ICD-10-CM | POA: Diagnosis present

## 2018-10-17 DIAGNOSIS — E119 Type 2 diabetes mellitus without complications: Secondary | ICD-10-CM

## 2018-10-17 DIAGNOSIS — Z8249 Family history of ischemic heart disease and other diseases of the circulatory system: Secondary | ICD-10-CM

## 2018-10-17 DIAGNOSIS — J9601 Acute respiratory failure with hypoxia: Secondary | ICD-10-CM | POA: Diagnosis present

## 2018-10-17 DIAGNOSIS — Z9989 Dependence on other enabling machines and devices: Secondary | ICD-10-CM

## 2018-10-17 DIAGNOSIS — Z7982 Long term (current) use of aspirin: Secondary | ICD-10-CM

## 2018-10-17 DIAGNOSIS — Z7984 Long term (current) use of oral hypoglycemic drugs: Secondary | ICD-10-CM

## 2018-10-17 DIAGNOSIS — I130 Hypertensive heart and chronic kidney disease with heart failure and stage 1 through stage 4 chronic kidney disease, or unspecified chronic kidney disease: Principal | ICD-10-CM | POA: Diagnosis present

## 2018-10-17 DIAGNOSIS — Z833 Family history of diabetes mellitus: Secondary | ICD-10-CM

## 2018-10-17 DIAGNOSIS — E1122 Type 2 diabetes mellitus with diabetic chronic kidney disease: Secondary | ICD-10-CM | POA: Diagnosis present

## 2018-10-17 DIAGNOSIS — I248 Other forms of acute ischemic heart disease: Secondary | ICD-10-CM | POA: Diagnosis present

## 2018-10-17 DIAGNOSIS — I509 Heart failure, unspecified: Secondary | ICD-10-CM

## 2018-10-17 DIAGNOSIS — E1165 Type 2 diabetes mellitus with hyperglycemia: Secondary | ICD-10-CM | POA: Diagnosis present

## 2018-10-17 DIAGNOSIS — N182 Chronic kidney disease, stage 2 (mild): Secondary | ICD-10-CM | POA: Diagnosis present

## 2018-10-17 DIAGNOSIS — R0902 Hypoxemia: Secondary | ICD-10-CM

## 2018-10-17 DIAGNOSIS — M109 Gout, unspecified: Secondary | ICD-10-CM | POA: Diagnosis present

## 2018-10-17 HISTORY — DX: Other ill-defined heart diseases: I51.89

## 2018-10-17 LAB — LIPID PANEL
Cholesterol: 164 mg/dL (ref 0–200)
HDL: 42 mg/dL (ref >40)
LDL Cholesterol: 71 mg/dL (ref 0–99)
Total CHOL/HDL Ratio: 3.9 RATIO
Triglycerides: 256 mg/dL — ABNORMAL HIGH (ref <150)
VLDL: 51 mg/dL — ABNORMAL HIGH (ref 0–40)

## 2018-10-17 LAB — GLUCOSE, CAPILLARY
Glucose-Capillary: 147 mg/dL — ABNORMAL HIGH (ref 70–99)
Glucose-Capillary: 155 mg/dL — ABNORMAL HIGH (ref 70–99)
Glucose-Capillary: 370 mg/dL — ABNORMAL HIGH (ref 70–99)

## 2018-10-17 LAB — CBC WITH DIFFERENTIAL/PLATELET
Abs Immature Granulocytes: 0.09 10*3/uL — ABNORMAL HIGH (ref 0.00–0.07)
Basophils Absolute: 0.1 10*3/uL (ref 0.0–0.1)
Basophils Relative: 1 %
Eosinophils Absolute: 0.4 10*3/uL (ref 0.0–0.5)
Eosinophils Relative: 4 %
HCT: 40.2 % (ref 39.0–52.0)
Hemoglobin: 12.3 g/dL — ABNORMAL LOW (ref 13.0–17.0)
Immature Granulocytes: 1 %
Lymphocytes Relative: 25 %
Lymphs Abs: 2.7 10*3/uL (ref 0.7–4.0)
MCH: 25.9 pg — ABNORMAL LOW (ref 26.0–34.0)
MCHC: 30.6 g/dL (ref 30.0–36.0)
MCV: 84.6 fL (ref 80.0–100.0)
Monocytes Absolute: 0.9 10*3/uL (ref 0.1–1.0)
Monocytes Relative: 8 %
Neutro Abs: 6.4 10*3/uL (ref 1.7–7.7)
Neutrophils Relative %: 61 %
Platelets: 208 10*3/uL (ref 150–400)
RBC: 4.75 MIL/uL (ref 4.22–5.81)
RDW: 15.6 % — ABNORMAL HIGH (ref 11.5–15.5)
WBC: 10.5 10*3/uL (ref 4.0–10.5)
nRBC: 0 % (ref 0.0–0.2)

## 2018-10-17 LAB — BASIC METABOLIC PANEL
Anion gap: 12 (ref 5–15)
BUN: 32 mg/dL — ABNORMAL HIGH (ref 6–20)
CO2: 28 mmol/L (ref 22–32)
Calcium: 9 mg/dL (ref 8.9–10.3)
Chloride: 101 mmol/L (ref 98–111)
Creatinine, Ser: 1.46 mg/dL — ABNORMAL HIGH (ref 0.61–1.24)
GFR calc Af Amer: >60 mL/min (ref >60)
GFR calc non Af Amer: 53 mL/min — ABNORMAL LOW (ref >60)
Glucose, Bld: 226 mg/dL — ABNORMAL HIGH (ref 70–99)
Potassium: 4.1 mmol/L (ref 3.5–5.1)
Sodium: 141 mmol/L (ref 135–145)

## 2018-10-17 LAB — TROPONIN I (HIGH SENSITIVITY)
Troponin I (High Sensitivity): 23 ng/L — ABNORMAL HIGH (ref <18)
Troponin I (High Sensitivity): 24 ng/L — ABNORMAL HIGH (ref <18)

## 2018-10-17 LAB — CBG MONITORING, ED: Glucose-Capillary: 172 mg/dL — ABNORMAL HIGH (ref 70–99)

## 2018-10-17 LAB — BRAIN NATRIURETIC PEPTIDE: B Natriuretic Peptide: 92.8 pg/mL (ref 0.0–100.0)

## 2018-10-17 LAB — SARS CORONAVIRUS 2 AG (30 MIN TAT): SARS Coronavirus 2 Ag: NEGATIVE

## 2018-10-17 LAB — SARS CORONAVIRUS 2 BY RT PCR (HOSPITAL ORDER, PERFORMED IN ~~LOC~~ HOSPITAL LAB): SARS Coronavirus 2: NEGATIVE

## 2018-10-17 MED ORDER — ALLOPURINOL 100 MG PO TABS: 100.0000 mg | ORAL_TABLET | Freq: Every day | ORAL | Status: DC

## 2018-10-17 MED ORDER — MIRTAZAPINE 15 MG PO TABS
30.0000 mg | ORAL_TABLET | Freq: Every day | ORAL | Status: DC
  Administered 2018-10-17 – 2018-10-20 (×4): 30 mg via ORAL
  Filled 2018-10-17 (×4): qty 2

## 2018-10-17 MED ORDER — ENOXAPARIN SODIUM 40 MG/0.4ML ~~LOC~~ SOLN
40.0000 mg | SUBCUTANEOUS | Status: DC
  Administered 2018-10-17: 40 mg via SUBCUTANEOUS
  Filled 2018-10-17: qty 0.4

## 2018-10-17 MED ORDER — POTASSIUM CHLORIDE CRYS ER 20 MEQ PO TBCR
20.0000 meq | EXTENDED_RELEASE_TABLET | Freq: Every day | ORAL | Status: DC
  Administered 2018-10-17 – 2018-10-21 (×5): 20 meq via ORAL
  Filled 2018-10-17 (×5): qty 1

## 2018-10-17 MED ORDER — ALLOPURINOL 300 MG PO TABS: 300.0000 mg | ORAL_TABLET | Freq: Two times a day (BID) | ORAL | Status: DC

## 2018-10-17 MED ORDER — HYDROCODONE-ACETAMINOPHEN 7.5-325 MG PO TABS
1.0000 | ORAL_TABLET | Freq: Four times a day (QID) | ORAL | Status: DC | PRN
  Administered 2018-10-17 – 2018-10-21 (×14): 1 via ORAL
  Filled 2018-10-17 (×15): qty 1

## 2018-10-17 MED ORDER — ROPINIROLE HCL 1 MG PO TABS
4.0000 mg | ORAL_TABLET | Freq: Every day | ORAL | Status: DC
  Administered 2018-10-17 – 2018-10-20 (×4): 4 mg via ORAL
  Filled 2018-10-17 (×5): qty 4

## 2018-10-17 MED ORDER — ENOXAPARIN SODIUM 40 MG/0.4ML ~~LOC~~ SOLN
40.0000 mg | Freq: Once | SUBCUTANEOUS | Status: AC
  Administered 2018-10-17: 40 mg via SUBCUTANEOUS
  Filled 2018-10-17: qty 0.4

## 2018-10-17 MED ORDER — PERFLUTREN LIPID MICROSPHERE
1.0000 mL | INTRAVENOUS | Status: AC | PRN
  Administered 2018-10-17: 5 mL via INTRAVENOUS
  Filled 2018-10-17 (×2): qty 10

## 2018-10-17 MED ORDER — ASPIRIN EC 81 MG PO TBEC
81.0000 mg | DELAYED_RELEASE_TABLET | Freq: Every morning | ORAL | Status: DC
  Administered 2018-10-17 – 2018-10-21 (×5): 81 mg via ORAL
  Filled 2018-10-17 (×5): qty 1

## 2018-10-17 MED ORDER — SODIUM CHLORIDE 0.9% FLUSH
3.0000 mL | INTRAVENOUS | Status: DC | PRN
  Administered 2018-10-19: 3 mL via INTRAVENOUS
  Filled 2018-10-17: qty 3

## 2018-10-17 MED ORDER — FUROSEMIDE 10 MG/ML IJ SOLN
40.0000 mg | Freq: Once | INTRAMUSCULAR | Status: AC
  Administered 2018-10-17: 40 mg via INTRAVENOUS
  Filled 2018-10-17: qty 4

## 2018-10-17 MED ORDER — INSULIN ASPART 100 UNIT/ML ~~LOC~~ SOLN
0.0000 [IU] | Freq: Three times a day (TID) | SUBCUTANEOUS | Status: DC
  Administered 2018-10-17 (×2): 2 [IU] via SUBCUTANEOUS
  Administered 2018-10-17: 3 [IU] via SUBCUTANEOUS
  Administered 2018-10-18: 8 [IU] via SUBCUTANEOUS
  Filled 2018-10-17: qty 1

## 2018-10-17 MED ORDER — METHOCARBAMOL 500 MG PO TABS
500.0000 mg | ORAL_TABLET | Freq: Three times a day (TID) | ORAL | Status: DC | PRN
  Administered 2018-10-18 – 2018-10-21 (×9): 500 mg via ORAL
  Filled 2018-10-17 (×9): qty 1

## 2018-10-17 MED ORDER — SODIUM CHLORIDE 0.9 % IV SOLN: 250.0000 mL | INTRAVENOUS | Status: DC | PRN

## 2018-10-17 MED ORDER — METHYLPREDNISOLONE SODIUM SUCC 125 MG IJ SOLR
60.0000 mg | Freq: Once | INTRAMUSCULAR | Status: AC
  Administered 2018-10-17: 60 mg via INTRAMUSCULAR
  Filled 2018-10-17: qty 2

## 2018-10-17 MED ORDER — ACETAMINOPHEN 325 MG PO TABS
650.0000 mg | ORAL_TABLET | ORAL | Status: DC | PRN
  Administered 2018-10-20 – 2018-10-21 (×2): 650 mg via ORAL
  Filled 2018-10-17 (×2): qty 2

## 2018-10-17 MED ORDER — SODIUM CHLORIDE 0.9% FLUSH
3.0000 mL | Freq: Two times a day (BID) | INTRAVENOUS | Status: DC
  Administered 2018-10-17 – 2018-10-18 (×4): 3 mL via INTRAVENOUS

## 2018-10-17 MED ORDER — ROPINIROLE HCL 1 MG PO TABS
4.0000 mg | ORAL_TABLET | Freq: Three times a day (TID) | ORAL | Status: DC
  Filled 2018-10-17 (×2): qty 4

## 2018-10-17 MED ORDER — ONDANSETRON HCL 4 MG/2ML IJ SOLN: 4.0000 mg | Freq: Four times a day (QID) | INTRAMUSCULAR | Status: DC | PRN

## 2018-10-17 MED ORDER — CLONAZEPAM 0.5 MG PO TABS
1.0000 mg | ORAL_TABLET | Freq: Every day | ORAL | Status: DC | PRN
  Administered 2018-10-17 – 2018-10-20 (×4): 1 mg via ORAL
  Filled 2018-10-17 (×4): qty 2

## 2018-10-17 MED ORDER — ROPINIROLE HCL 1 MG PO TABS
8.0000 mg | ORAL_TABLET | Freq: Every day | ORAL | Status: DC
  Administered 2018-10-17 – 2018-10-20 (×4): 8 mg via ORAL
  Filled 2018-10-17 (×4): qty 8

## 2018-10-17 MED ORDER — ATORVASTATIN CALCIUM 80 MG PO TABS
80.0000 mg | ORAL_TABLET | Freq: Every day | ORAL | Status: DC
  Administered 2018-10-17 – 2018-10-20 (×4): 80 mg via ORAL
  Filled 2018-10-17 (×4): qty 1

## 2018-10-17 MED ORDER — ENOXAPARIN SODIUM 80 MG/0.8ML ~~LOC~~ SOLN
0.5000 mg/kg | SUBCUTANEOUS | Status: DC
  Administered 2018-10-18 – 2018-10-20 (×3): 80 mg via SUBCUTANEOUS
  Filled 2018-10-17 (×4): qty 0.8

## 2018-10-17 MED ORDER — INSULIN ASPART 100 UNIT/ML ~~LOC~~ SOLN
0.0000 [IU] | Freq: Every day | SUBCUTANEOUS | Status: DC
  Administered 2018-10-17: 5 [IU] via SUBCUTANEOUS

## 2018-10-17 MED ORDER — HYDROCODONE-ACETAMINOPHEN 5-325 MG PO TABS
ORAL_TABLET | ORAL | Status: AC
  Administered 2018-10-17: 1.5
  Filled 2018-10-17: qty 2

## 2018-10-17 MED ORDER — IOHEXOL 350 MG/ML SOLN
100.0000 mL | Freq: Once | INTRAVENOUS | Status: AC | PRN
  Administered 2018-10-17: 98 mL via INTRAVENOUS

## 2018-10-17 MED ORDER — HYDRALAZINE HCL 20 MG/ML IJ SOLN
10.0000 mg | INTRAMUSCULAR | Status: DC | PRN
  Administered 2018-10-17: 10 mg via INTRAVENOUS
  Filled 2018-10-17: qty 1

## 2018-10-17 MED ORDER — FUROSEMIDE 10 MG/ML IJ SOLN
40.0000 mg | Freq: Two times a day (BID) | INTRAMUSCULAR | Status: DC
  Administered 2018-10-17 – 2018-10-19 (×5): 40 mg via INTRAVENOUS
  Filled 2018-10-17 (×5): qty 4

## 2018-10-17 NOTE — Plan of Care (Addendum)
Transfer from [facility]. Justin Hall is a 53 y/o with pmh of HTN, DM, obesity, RLS; who presents with leg swelling and dyspnea with exertion.  Reported to be hypoxic.  BNP 92.8, high-sensitivity troponin 23, BUN 32, creatinine 1.46.  COVID-19 screening negative.  CTA revealed no PE, cardiomegaly without edema, coronary artery calcifications particularly dense within the left anterior descending coronary artery, platelike conversation in the right lung base suspected to be atelectasis, and 5 mm nodular density in the right upper lobe of the lung.  He was given 40 mg of Lasix IV.  Accepted as observation to a telemetry bed for suspected CHF exacerbation.  Will also check TSH, lipid panel, and hemoglobin A1c.

## 2018-10-17 NOTE — ED Provider Notes (Signed)
Care assumed from Dr. Randal Buba at shift change.  Patient presenting here with a history of increased leg swelling and dyspnea on exertion/orthopnea.  Patient awaiting results of a CT scan to rule out pulmonary embolism.  This has been obtained and is negative.  It does show cardiomegaly and possible coronary artery lesion, however no other acute abnormality.  Patient's presentation, and physical examination both concerning for congestive heart failure.  Although his BNP is only 93, patient is saturating in the low 90s.  Patient will require admission for diuresis.  Care discussed with Dr. Tamala Julian who agrees to admit.   Veryl Speak, MD 10/17/18 [phone]

## 2018-10-17 NOTE — ED Notes (Signed)
Pt to CT at this time.

## 2018-10-17 NOTE — ED Triage Notes (Addendum)
Bilateral leg swelling and pain for over a week. Reports he has an appointment with PCP on Monday but cannot wait. Second great toe nail black. Also reporting shortness of breath. Room air sat after ambulation 88-91%.

## 2018-10-17 NOTE — Progress Notes (Signed)
Discussed patient with primary team. Presents with SOB, LE edema. BNP normal, CT PE negative with pulmonary edema. Extensive cardiac workup last year with echo and cath benign other than mild diastolic dysfunction. Would try IV diuresis and follow edema and sweeling and repeat echo, if significant change in echo please contact us back.    Zandra Abts MD
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
## 2018-10-17 NOTE — ED Notes (Signed)
Spoke to patient's wife to tell her where patient was going at [facility].

## 2018-10-17 NOTE — ED Notes (Signed)
Norco dose cannot be given here. Not available in our formulary. Patient sleeping at the moment.

## 2018-10-17 NOTE — Progress Notes (Signed)
  Echocardiogram 2D Echocardiogram has been performed.  Justin Hall 10/17/2018, 6:16 PM
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
## 2018-10-17 NOTE — ED Notes (Signed)
Spoke with wife to update on condition and admission.
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
## 2018-10-18 ENCOUNTER — Encounter (HOSPITAL_COMMUNITY): Payer: Self-pay | Admitting: Internal Medicine

## 2018-10-18 ENCOUNTER — Encounter: Payer: Self-pay | Admitting: Family Medicine

## 2018-10-18 DIAGNOSIS — I5189 Other ill-defined heart diseases: Secondary | ICD-10-CM

## 2018-10-18 DIAGNOSIS — J9601 Acute respiratory failure with hypoxia: Secondary | ICD-10-CM | POA: Diagnosis present

## 2018-10-18 DIAGNOSIS — I5033 Acute on chronic diastolic (congestive) heart failure: Secondary | ICD-10-CM

## 2018-10-18 DIAGNOSIS — N179 Acute kidney failure, unspecified: Secondary | ICD-10-CM | POA: Diagnosis present

## 2018-10-18 LAB — BASIC METABOLIC PANEL
Anion gap: 12 (ref 5–15)
BUN: 31 mg/dL — ABNORMAL HIGH (ref 6–20)
CO2: 29 mmol/L (ref 22–32)
Calcium: 8.7 mg/dL — ABNORMAL LOW (ref 8.9–10.3)
Chloride: 98 mmol/L (ref 98–111)
Creatinine, Ser: 1.52 mg/dL — ABNORMAL HIGH (ref 0.61–1.24)
GFR calc Af Amer: 59 mL/min — ABNORMAL LOW (ref >60)
GFR calc non Af Amer: 51 mL/min — ABNORMAL LOW (ref >60)
Glucose, Bld: 318 mg/dL — ABNORMAL HIGH (ref 70–99)
Potassium: 4 mmol/L (ref 3.5–5.1)
Sodium: 139 mmol/L (ref 135–145)

## 2018-10-18 LAB — CBC WITH DIFFERENTIAL/PLATELET
Abs Immature Granulocytes: 0.09 10*3/uL — ABNORMAL HIGH (ref 0.00–0.07)
Basophils Absolute: 0 10*3/uL (ref 0.0–0.1)
Basophils Relative: 0 %
Eosinophils Absolute: 0 10*3/uL (ref 0.0–0.5)
Eosinophils Relative: 0 %
HCT: 38.3 % — ABNORMAL LOW (ref 39.0–52.0)
Hemoglobin: 12.3 g/dL — ABNORMAL LOW (ref 13.0–17.0)
Immature Granulocytes: 1 %
Lymphocytes Relative: 8 %
Lymphs Abs: 1 10*3/uL (ref 0.7–4.0)
MCH: 25.8 pg — ABNORMAL LOW (ref 26.0–34.0)
MCHC: 32.1 g/dL (ref 30.0–36.0)
MCV: 80.5 fL (ref 80.0–100.0)
Monocytes Absolute: 0.3 10*3/uL (ref 0.1–1.0)
Monocytes Relative: 2 %
Neutro Abs: 10.6 10*3/uL — ABNORMAL HIGH (ref 1.7–7.7)
Neutrophils Relative %: 89 %
Platelets: 229 10*3/uL (ref 150–400)
RBC: 4.76 MIL/uL (ref 4.22–5.81)
RDW: 15.4 % (ref 11.5–15.5)
WBC: 12 10*3/uL — ABNORMAL HIGH (ref 4.0–10.5)
nRBC: 0 % (ref 0.0–0.2)

## 2018-10-18 LAB — IRON AND TIBC
Iron: 39 ug/dL — ABNORMAL LOW (ref 45–182)
Saturation Ratios: 9 % — ABNORMAL LOW (ref 17.9–39.5)
TIBC: 444 ug/dL (ref 250–450)
UIBC: 405 ug/dL

## 2018-10-18 LAB — ECHOCARDIOGRAM COMPLETE
Height: 70 in
Weight: 5531.2 oz

## 2018-10-18 LAB — HEMOGLOBIN A1C
Hgb A1c MFr Bld: 10.3 % — ABNORMAL HIGH (ref 4.8–5.6)
Mean Plasma Glucose: 249 mg/dL

## 2018-10-18 LAB — GLUCOSE, CAPILLARY
Glucose-Capillary: 286 mg/dL — ABNORMAL HIGH (ref 70–99)
Glucose-Capillary: 155 mg/dL — ABNORMAL HIGH (ref 70–99)
Glucose-Capillary: 286 mg/dL — ABNORMAL HIGH (ref 70–99)
Glucose-Capillary: 368 mg/dL — ABNORMAL HIGH (ref 70–99)

## 2018-10-18 LAB — MRSA PCR SCREENING: MRSA by PCR: NEGATIVE

## 2018-10-18 LAB — FERRITIN: Ferritin: 46 ng/mL (ref 24–336)

## 2018-10-18 MED ORDER — HYDRALAZINE HCL 20 MG/ML IJ SOLN
10.0000 mg | INTRAMUSCULAR | Status: DC | PRN
  Administered 2018-10-18 – 2018-10-20 (×4): 10 mg via INTRAVENOUS
  Filled 2018-10-18 (×4): qty 1

## 2018-10-18 MED ORDER — METHYLPREDNISOLONE SODIUM SUCC 125 MG IJ SOLR
60.0000 mg | Freq: Once | INTRAMUSCULAR | Status: AC
  Administered 2018-10-18: 60 mg via INTRAMUSCULAR
  Filled 2018-10-18: qty 2

## 2018-10-18 MED ORDER — INSULIN ASPART 100 UNIT/ML ~~LOC~~ SOLN
0.0000 [IU] | Freq: Every day | SUBCUTANEOUS | Status: DC
  Administered 2018-10-18: 5 [IU] via SUBCUTANEOUS

## 2018-10-18 MED ORDER — CARVEDILOL 3.125 MG PO TABS
3.1250 mg | ORAL_TABLET | Freq: Two times a day (BID) | ORAL | Status: DC
  Administered 2018-10-18 – 2018-10-19 (×2): 3.125 mg via ORAL
  Filled 2018-10-18 (×2): qty 1

## 2018-10-18 MED ORDER — INSULIN ASPART 100 UNIT/ML ~~LOC~~ SOLN
0.0000 [IU] | Freq: Three times a day (TID) | SUBCUTANEOUS | Status: DC
  Administered 2018-10-18: 11 [IU] via SUBCUTANEOUS
  Administered 2018-10-19: 7 [IU] via SUBCUTANEOUS

## 2018-10-18 MED FILL — Perflutren Lipid Microsphere IV Susp 1.1 MG/ML: INTRAVENOUS | Qty: 10 | Status: AC
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
## 2018-10-18 NOTE — Progress Notes (Signed)
Inpatient Diabetes Program Recommendations  AACE/ADA: New Consensus Statement on Inpatient Glycemic Control (2015)  Target Ranges:  Prepandial:   less than 140 mg/dL      Peak postprandial:   less than 180 mg/dL (1-2 hours)      Critically ill patients:  140 - 180 mg/dL   Lab Results  Component Value Date   GLUCAP 155 (H) 10/18/2018   HGBA1C 10.3 (H) 10/17/2018    Review of Glycemic Control  Diabetes history: type 2 Outpatient Diabetes medications: diabeta, Metformin 1000 mg BID Current orders for Inpatient glycemic control: Novolog RESISTANT correction scale TID & HS scale  Inpatient Diabetes Program Recommendations:   Received diabetes coordinator consult. Recommend adding Novolog 5 units TID as meal coverage if patient eats at least 50% of meal, especially while on steroids. Noted that hgbA1C is 10.3%.   Will continue to monitor blood sugars while in the hospital.   Harvel Ricks RN BSN CDE Diabetes Coordinator Pager: [phone]  8am-5pm

## 2018-10-18 NOTE — Progress Notes (Addendum)
[facility] PROGRESS NOTE  Justin Hall AYT:[id number] DOB: October 02, 1965 DOA: 10/17/2018 PCP: Justin Pal, DO  Assessment/Plan:  #1. Acute respiratory failure with hypoxia related to acute chf/worsening diastolic dysfunction. Oxygen saturation level 88% on room air with ambulation. Chest xray with no active disease but cardiomegaly. BNP 92. Troponin slightly elevated but flat. Patient states PCP recently started him on Lasix for LE edema 5 days ago and his symptoms worsened.  Given IV lasix and diuresing well. Volume status -2L -continue IV lasix -follow echo results -monitor intake and output -daily weights   #2. Acute chf/worsening diastolic dysfunction. See #1. Echo with EF 50%, severely increased left ventricular wll thickness and diffuse hypokinesis. LE edema up to abdomen. Orthopnea and DOE. Recently started on Lasix by PCP. Given IV lasix and diuresing well. Weight this am 341.9 down from 345.7. I expect he will need a couple more days of diuresing -continue IV lasix -monitor intake and output -obtain daily weight -continue lisinopril.  -have requested cards input given echo results/change from previous  #3. Acute kidney injury. Likely related to above in setting of  Uncontrolled BP, indomethacin use.  Creatinine 1.52 this am. Chart review indicates creatinine within limits of normal 7 months ago.  -monitor -hold nephrotoxins as able -continue IV lasix. -monitor urine output -recheck in am  #4.hypertension. poor control. Home meds include lasix and lisinopril.  -holding lisinopril -hydralazine prn -monitor  #5. Diabetes. Uncontrolled. A1c 10.3. Home meds include glyguride and metformin. Serum glucose 318 this am. He received solumedrol IM yesterday -hold oral agents -will increase SSI to resistant given steroid use -may need meal coverage as he is eating well -diabetes coordinator consult  #6. Glout flare left hip. Patient with chronic gout pain per chart  review. Home meds include indomethacin and hydrocodone, allopurinol. He was given solumedrol right hip yesterday. No relief. States he "usually gets 2 shots" -solu medrol IM once now -hydrocode prn -holding allopurinol  #7. CAD/elevated troponin. No chest pain. Troponin slightly elevated but flat. Likely demand.  ekg without acute changes. Of note cardiac cath 2019 noted non-obstructive disease. CT angiogram of chest did note dense calcifications left anterior descending coronary artery.  -await echo results -cards consult depending on echo results  #8. Hypochromic anemia. Hg 12.6. iron 39 and saturation ratio 9 concerning for IDA.  -OP follow up -consider iron supplement  #9. Morbid obesity. BMI 44.4  #10. OSA  -cpap  Code Status:  Family Communication: patient  Disposition Plan: home when ready   Consultants:    Procedures:  echo  Antibiotics:    HPI/Subjective: 53 yo admitted with acute respiratory failure related to acute chf/worseining diastolic dysfunction. Po lasix started las week and symptoms worsened. IV lasix started and echo  Objective: Vitals:   10/17/18 2059 10/18/18 0552  BP: (!) 186/97 (!) 185/107  Pulse: 93 96  Resp: (!) 22 20  Temp: 98.2 F (36.8 C) 97.9 F (36.6 C)  SpO2: 94% 91%    Intake/Output Summary (Last 24 hours) at 10/18/2018 1049 Last data filed at 10/18/2018 0748 Gross per 24 hour  Intake 480 ml  Output 1700 ml  Net -1220 ml   Filed Weights   10/17/18 0539 10/17/18 1218 10/18/18 0552  Weight: (!) 140.6 kg (!) 156.8 kg (!) 155.1 kg    Exam:   General:  Obese sitting on side of bed eating pancakes no acute distress  Cardiovascular: HS distant. No mgr 2+ LE pitting edema.   Respiratory: mild increased work of  breathing with conversation. BS quite diminished particularly on right. Fine crackles left base no wheeze  Abdomen: obese slightly tight. +BS no guarding or rebounding  Musculoskeletal: joint without  swelling/erythema. Left hip tenderness to palpation   Data Reviewed: Basic Metabolic Panel: Recent Labs  Lab 10/17/18 0556 10/18/18 0350  NA 141 139  K 4.1 4.0  CL 101 98  CO2 28 29  GLUCOSE 226* 318*  BUN 32* 31*  CREATININE 1.46* 1.52*  CALCIUM 9.0 8.7*   Liver Function Tests: No results for input(s): AST, ALT, ALKPHOS, BILITOT, PROT, ALBUMIN in the last 168 hours. No results for input(s): LIPASE, AMYLASE in the last 168 hours. No results for input(s): AMMONIA in the last 168 hours. CBC: Recent Labs  Lab 10/17/18 0556 10/18/18 0350  WBC 10.5 12.0*  NEUTROABS 6.4 10.6*  HGB 12.3* 12.3*  HCT 40.2 38.3*  MCV 84.6 80.5  PLT 208 229   Cardiac Enzymes: No results for input(s): CKTOTAL, CKMB, CKMBINDEX, TROPONINI in the last 168 hours. BNP (last 3 results) Recent Labs    10/17/18 0556  BNP 92.8    ProBNP (last 3 results) No results for input(s): PROBNP in the last 8760 hours.  CBG: Recent Labs  Lab 10/17/18 0805 10/17/18 1233 10/17/18 1629 10/17/18 2055 10/18/18 0601  GLUCAP 172* 147* 155* 370* 286*    Recent Results (from the past 240 hour(s))  SARS Coronavirus 2 (Hosp order,Performed in [facility] lab via [employer] ID)     Status: None   Collection Time: 10/17/18  5:56 AM   Specimen: Dry Nasal Swab ([employer] ID Now)  Result Value Ref Range Status   SARS Coronavirus 2 ([employer] ID Now) NEGATIVE NEGATIVE Final    Comment: (NOTE) Interpretive Result Comment(s): COVID 19 Positive SARS CoV 2 target nucleic acids are DETECTED. The SARS CoV 2 RNA is generally detectable in upper and lower respiratory specimens during the acute phase of infection.  Positive results are indicative of active infection with SARS CoV 2.  Clinical correlation with patient history and other diagnostic information is necessary to determine patient infection status.  Positive results do not rule out bacterial infection or coinfection with other viruses. The expected result is  Negative. COVID 19 Negative SARS CoV 2 target nucleic acids are NOT DETECTED. The SARS CoV 2 RNA is generally detectable in upper and lower respiratory specimens during the acute phase of infection.  Negative results do not preclude SARS CoV 2 infection, do not rule out coinfections with other pathogens, and should not be used as the sole basis for treatment or other patient management decisions.  Negative results must be combined with clinical  observations, patient history, and epidemiological information. The expected result is Negative. Invalid Presence or absence of SARS CoV 2 nucleic acids cannot be determined. Repeat testing was performed on the submitted specimen and repeated Invalid results were obtained.  If clinically indicated, additional testing on a new specimen with an alternate test methodology [id number]) is advised.  The SARS CoV 2 RNA is generally detectable in upper and lower respiratory specimens during the acute phase of infection. The expected result is Negative. Fact Sheet for Patients:  GolfingFamily.no Fact Sheet for Healthcare Providers: https://www.hernandez-brewer.com/ This test is not yet approved or cleared by the [place] FDA and has been authorized for detection and/or diagnosis of SARS CoV 2 by FDA under an Emergency Use Authorization (EUA).  This EUA will remain in effect (meaning this test can be used) for the duration of the COVID19  d eclaration under Section 564(b)(1) of the Act, 21 U.S.C. section [phone] 3(b)(1), unless the authorization is terminated or revoked sooner. Performed at [facility], [address] [place], [place] [address]   MRSA PCR Screening     Status: Abnormal   Collection Time: 10/17/18  7:59 PM   Specimen: Nasal Mucosa; Nasopharyngeal  Result Value Ref Range Status   MRSA by PCR (A) NEGATIVE Final    INVALID, UNABLE TO DETERMINE THE PRESENCE OF TARGET DUE TO SPECIMEN  INTEGRITY. RECOLLECTION REQUESTED.    Comment: NOTIFIED ATAMBILE,J RN 10/17/2018 AT 0021 SKEEN,P Performed at [facility], [place] [address] [place], [place] [address]   SARS Coronavirus 2 [facility] order, Performed in [facility] hospital lab)     Status: None   Collection Time: 10/17/18  8:57 PM  Result Value Ref Range Status   SARS Coronavirus 2 NEGATIVE NEGATIVE Final    Comment: (NOTE) If result is NEGATIVE SARS-CoV-2 target nucleic acids are NOT DETECTED. The SARS-CoV-2 RNA is generally detectable in upper and lower  respiratory specimens during the acute phase of infection. The lowest  concentration of SARS-CoV-2 viral copies this assay can detect is 250  copies / mL. A negative result does not preclude SARS-CoV-2 infection  and should not be used as the sole basis for treatment or other  patient management decisions.  A negative result may occur with  improper specimen collection / handling, submission of specimen other  than nasopharyngeal swab, presence of viral mutation(s) within the  areas targeted by this assay, and inadequate number of viral copies  (<250 copies / mL). A negative result must be combined with clinical  observations, patient history, and epidemiological information. If result is POSITIVE SARS-CoV-2 target nucleic acids are DETECTED. The SARS-CoV-2 RNA is generally detectable in upper and lower  respiratory specimens dur ing the acute phase of infection.  Positive  results are indicative of active infection with SARS-CoV-2.  Clinical  correlation with patient history and other diagnostic information is  necessary to determine patient infection status.  Positive results do  not rule out bacterial infection or co-infection with other viruses. If result is PRESUMPTIVE POSTIVE SARS-CoV-2 nucleic acids MAY BE PRESENT.   A presumptive positive result was obtained on the submitted specimen  and confirmed on repeat testing.  While 2019 novel coronavirus   (SARS-CoV-2) nucleic acids may be present in the submitted sample  additional confirmatory testing may be necessary for epidemiological  and / or clinical management purposes  to differentiate between  SARS-CoV-2 and other Sarbecovirus currently known to infect humans.  If clinically indicated additional testing with an alternate test  methodology [phone]) is advised. The SARS-CoV-2 RNA is generally  detectable in upper and lower respiratory sp ecimens during the acute  phase of infection. The expected result is Negative. Fact Sheet for Patients:  StrictlyIdeas.no Fact Sheet for Healthcare Providers: BankingDealers.co.za This test is not yet approved or cleared by the [place] FDA and has been authorized for detection and/or diagnosis of SARS-CoV-2 by FDA under an Emergency Use Authorization (EUA).  This EUA will remain in effect (meaning this test can be used) for the duration of the COVID-19 declaration under Section 564(b)(1) of the Act, 21 U.S.C. section 360bbb-3(b)(1), unless the authorization is terminated or revoked sooner. Performed at [facility], [place] [address] [place], [place] [address]   MRSA PCR Screening     Status: None   Collection Time: 10/18/18 12:43 AM  Specimen: Nasal Mucosa; Nasopharyngeal  Result Value Ref Range Status   MRSA by PCR NEGATIVE NEGATIVE Final    Comment:        The GeneXpert MRSA Assay (FDA approved for NASAL specimens only), is one component of a comprehensive MRSA colonization surveillance program. It is not intended to diagnose MRSA infection nor to guide or monitor treatment for MRSA infections. Performed at [facility], [place] [address] [place], [place] [address]      Studies: Ct Angio Chest Pe W And/or Wo Contrast  Result Date: 10/17/2018 CLINICAL DATA:  Shortness of breath, worsening over the week, swollen legs. EXAM: CT ANGIOGRAPHY CHEST WITH CONTRAST  TECHNIQUE: Multidetector CT imaging of the chest was performed using the standard protocol during bolus administration of intravenous contrast. Multiplanar CT image reconstructions and MIPs were obtained to evaluate the vascular anatomy. CONTRAST:  41mL OMNIPAQUE IOHEXOL 350 MG/ML SOLN COMPARISON:  None. FINDINGS: Cardiovascular: Some of the most peripheral segmental and subsegmental pulmonary artery branches cannot be definitively characterized due to mild patient breathing motion artifact, however, there is no pulmonary embolism identified within the main, lobar or central segmental pulmonary arteries bilaterally. No thoracic aortic aneurysm or evidence of aortic dissection. Cardiomegaly. No pericardial effusion. Coronary artery calcifications noted, particularly dense within the LEFT anterior descending coronary artery. Mediastinum/Nodes: No mass or enlarged lymph nodes within the mediastinum or perihilar regions. Esophagus appears normal. Trachea and central bronchi are unremarkable. Lungs/Pleura: Platelike consolidation at the RIGHT lung base, likely atelectasis mosaic pattern bilaterally suggests air trapping. 5 mm nodular density appreciated within the inferior-lateral portion of the RIGHT upper lobe (series 6, image 37). No pleural effusion or pneumothorax. Upper Abdomen: Limited images of the upper abdomen are unremarkable. 3 mm LEFT renal stone. Musculoskeletal: No acute or suspicious osseous finding. Degenerative spondylosis of the thoracic spine, mild to moderate in degree. Degenerative changes at both shoulders, incompletely imaged. Review of the MIP images confirms the above findings. IMPRESSION: 1. No pulmonary embolism identified, with mild study limitations detailed above. 2. Cardiomegaly. No pericardial effusion. Coronary artery calcifications, particularly dense within the left anterior descending coronary artery. Recommend correlation with any possible associated cardiac symptoms. 3. Platelike  consolidation at the right lung base, compatible with atelectasis, likely chronic. Evidence of air trapping bilaterally. No evidence of pneumonia. 4. 5 mm nodular density within the inferior-lateral portion of the right upper lobe. No follow-up needed if patient is low-risk. Non-contrast chest CT can be considered in 12 months if patient is high-risk. This recommendation follows the consensus statement: Guidelines for Management of Incidental Pulmonary Nodules Detected on CT Images: From the Fleischner Society 2017; Radiology 2017; 284:228-243. 5. 3 mm nonobstructing left renal stone. Electronically Signed   By: Franki Cabot M.D.   On: 10/17/2018 07:07   Dg Chest Portable 1 View  Result Date: 10/17/2018 CLINICAL DATA:  Shortness of breath EXAM: PORTABLE CHEST 1 VIEW COMPARISON:  09/01/2018 FINDINGS: The heart size and mediastinal contours are within normal limits. Both lungs are clear. The visualized skeletal structures are unremarkable. IMPRESSION: No active disease. Electronically Signed   By: Ulyses Jarred M.D.   On: 10/17/2018 06:11    Scheduled Meds: . aspirin EC  81 mg Oral q morning - 10a  . atorvastatin  80 mg Oral q1800  . enoxaparin (LOVENOX) injection  0.5 mg/kg Subcutaneous Q24H  . furosemide  40 mg Intravenous BID  . insulin aspart  0-15 Units Subcutaneous TID WC  . insulin aspart  0-5 Units Subcutaneous  QHS  . mirtazapine  30 mg Oral QHS  . potassium chloride  20 mEq Oral Daily  . rOPINIRole  4 mg Oral Daily  . rOPINIRole  8 mg Oral QHS  . sodium chloride flush  3 mL Intravenous Q12H   Continuous Infusions: . sodium chloride      Principal Problem:   Acute respiratory failure with hypoxia ([name]) Active Problems:   Essential hypertension   Gout flare   Acute exacerbation of CHF (congestive heart failure) ([name])   Coronary artery disease   Diastolic dysfunction   Acute kidney injury ([name])   DM2 (diabetes mellitus, type 2) ([name])   Morbid obesity ([name])   Restless leg  syndrome    Time spent: 46 minutes    [place] NP  [facility]  If 7PM-7AM, please contact night-coverage at [email or web address], password [facility] 10/18/2018, 10:49 AM  LOS: 0 days

## 2018-10-18 NOTE — Progress Notes (Signed)
Pt states he will place himself on CPAP once he is ready for bed,  RT will continue to monitor.

## 2018-10-19 LAB — BASIC METABOLIC PANEL
Anion gap: 10 (ref 5–15)
BUN: 34 mg/dL — ABNORMAL HIGH (ref 6–20)
CO2: 33 mmol/L — ABNORMAL HIGH (ref 22–32)
Calcium: 8.6 mg/dL — ABNORMAL LOW (ref 8.9–10.3)
Chloride: 97 mmol/L — ABNORMAL LOW (ref 98–111)
Creatinine, Ser: 1.33 mg/dL — ABNORMAL HIGH (ref 0.61–1.24)
GFR calc Af Amer: >60 mL/min (ref >60)
GFR calc non Af Amer: 60 mL/min — ABNORMAL LOW (ref >60)
Glucose, Bld: 189 mg/dL — ABNORMAL HIGH (ref 70–99)
Potassium: 3.7 mmol/L (ref 3.5–5.1)
Sodium: 140 mmol/L (ref 135–145)

## 2018-10-19 LAB — GLUCOSE, CAPILLARY
Glucose-Capillary: 207 mg/dL — ABNORMAL HIGH (ref 70–99)
Glucose-Capillary: 201 mg/dL — ABNORMAL HIGH (ref 70–99)
Glucose-Capillary: 182 mg/dL — ABNORMAL HIGH (ref 70–99)
Glucose-Capillary: 177 mg/dL — ABNORMAL HIGH (ref 70–99)

## 2018-10-19 MED ORDER — INSULIN ASPART 100 UNIT/ML ~~LOC~~ SOLN: 0.0000 [IU] | Freq: Every day | SUBCUTANEOUS | Status: DC

## 2018-10-19 MED ORDER — AMLODIPINE BESYLATE 2.5 MG PO TABS
5.0000 mg | ORAL_TABLET | Freq: Every day | ORAL | Status: DC
  Administered 2018-10-19 – 2018-10-20 (×2): 5 mg via ORAL
  Filled 2018-10-19 (×3): qty 2

## 2018-10-19 MED ORDER — INSULIN ASPART PROT & ASPART (70-30 MIX) 100 UNIT/ML ~~LOC~~ SUSP
17.0000 [IU] | Freq: Two times a day (BID) | SUBCUTANEOUS | Status: DC
  Administered 2018-10-19 – 2018-10-21 (×4): 17 [IU] via SUBCUTANEOUS
  Filled 2018-10-19: qty 10

## 2018-10-19 MED ORDER — HYDRALAZINE HCL 10 MG PO TABS: 10.0000 mg | ORAL_TABLET | Freq: Three times a day (TID) | ORAL | Status: DC

## 2018-10-19 MED ORDER — CARVEDILOL 3.125 MG PO TABS
3.1250 mg | ORAL_TABLET | Freq: Once | ORAL | Status: AC
  Administered 2018-10-19: 3.125 mg via ORAL
  Filled 2018-10-19: qty 1

## 2018-10-19 MED ORDER — INSULIN ASPART 100 UNIT/ML ~~LOC~~ SOLN
0.0000 [IU] | Freq: Three times a day (TID) | SUBCUTANEOUS | Status: DC
  Administered 2018-10-19 – 2018-10-21 (×5): 2 [IU] via SUBCUTANEOUS

## 2018-10-19 MED ORDER — CARVEDILOL 6.25 MG PO TABS
6.2500 mg | ORAL_TABLET | Freq: Two times a day (BID) | ORAL | Status: DC
  Administered 2018-10-19 – 2018-10-21 (×4): 6.25 mg via ORAL
  Filled 2018-10-19 (×4): qty 1

## 2018-10-19 MED ORDER — INSULIN ASPART 100 UNIT/ML ~~LOC~~ SOLN
5.0000 [IU] | Freq: Three times a day (TID) | SUBCUTANEOUS | Status: DC
  Administered 2018-10-19: 5 [IU] via SUBCUTANEOUS

## 2018-10-19 MED ORDER — INSULIN STARTER KIT- PEN NEEDLES (ENGLISH)
1.0000 | Freq: Once | Status: AC
  Administered 2018-10-19: 1
  Filled 2018-10-19 (×2): qty 1

## 2018-10-19 MED ORDER — LIVING WELL WITH DIABETES BOOK
Freq: Once | Status: AC
  Administered 2018-10-19: 14:00:00
  Filled 2018-10-19: qty 1

## 2018-10-19 NOTE — Progress Notes (Signed)
Inpatient Diabetes Program Recommendations  AACE/ADA: New Consensus Statement on Inpatient Glycemic Control (2015)  Target Ranges:  Prepandial:   less than 140 mg/dL      Peak postprandial:   less than 180 mg/dL (1-2 hours)      Critically ill patients:  140 - 180 mg/dL   Lab Results  Component Value Date   GLUCAP 207 (H) 10/19/2018   HGBA1C 10.3 (H) 10/17/2018    Review of Glycemic Control Results for [name], Justin Hall" (MRN [id number]) as of 10/19/2018 10:37  Ref. Range 10/18/2018 06:01 10/18/2018 11:10 10/18/2018 16:12 10/18/2018 22:01 10/19/2018 06:30  Glucose-Capillary Latest Ref Range: 70 - 99 mg/dL 286 (H) 155 (H) 286 (H) 368 (H) 207 (H)   Diabetes history: type 2 Outpatient Diabetes medications: diabeta, Metformin 1000 mg BID Current orders for Inpatient glycemic control: Novolog 5 units tid meal coverage tid + Novolog RESISTANT correction scale TID & HS scale  Inpatient Diabetes Program Recommendations: -Novolog 70/30 insulin 17 units ac bid (equals approx. 24 units basal + 10 units meal coverage) -D/C Novolog meal coverage -Decrease Novolog resistant scale to moderate tid + hs     Spoke with patient via phone (DM coordinator working  remotely). Patient was on Novolog 70/30 insulin in the past but his doctor discontinued and started on orals when A1c was very good. Reviewed current A1c with patient of 10.3 (average blood glucose 249 over the past 2-3 months). Patient states he does best on insulin and took 70/30 10 units bid ac meals. Reviewed with patient Novolin insulin now has insulin pens available and patient prefers pens. RN [name] to start working with patient today. Ordered Living Well with Diabetes and insulin pen starter kit.  Thank you, [name]. [name], RN, MSN, CDE  Diabetes Coordinator Inpatient Glycemic Control Team Team Pager [phone] (8am-5pm) 10/19/2018 11:00 AM

## 2018-10-19 NOTE — Progress Notes (Signed)
[facility] PROGRESS NOTE  Justin Hall JYN:[id number] DOB: 1965/07/27 DOA: 10/17/2018 PCP: [name], DO  Assessment/Plan:  #1. Acute respiratory failure with hypoxia related to acute chf/worsening diastolic dysfunction. Resolved this am. Oxygen saturation level greater than 90% on room air. Able to ly flat last night. Echo with EF 50% severely increased left wall thickness. Weight the same as yesterday. Volume status -4L  -continue IV lasix ?dose -monitor intake and output -daily weights  #2. Acute chf/worsening diastolic dysfunction.  Diuresing well but remains volume overloaded. Creatinine trending down with diuresis.  See #1. Echo with EF 50%, severely increased left ventricular wll thickness and diffuse hypokinesis. LE edema up to abdomen.  -continue IV lasix, will defer dosing to cardiology -monitor intake and output -obtain daily weight -continue lisinopril.  -increase BB per cards  #3. Acute kidney injury. Likely related to above in setting of  Uncontrolled BP, indomethacin use.  Creatinine 1.3 this am trending down from yesterday.  Chart review indicates creatinine within limits of normal 7 months ago.  -monitor -hold nephrotoxins as able -continue IV lasix. -monitor urine output -recheck in am  #4.hypertension. poor control. Home meds include lasix and lisinopril. He was started on low dose BB per cards. That dose increased today.  -holding lisinopril -add po scheduled hydralazine -hydralazine prn -monitor  #5. Diabetes. Uncontrolled. A1c 10.3. Home meds include glyguride and metformin. Patient reports being on 70/30 in past but discontinued due to "insurance".  Serum glucoss 189 this am.  Appreciate diabetes coordinator input.  -hold oral agents -will increase SSI to resistant given steroid use -may need meal coverage as he is eating well -diabetes coordinator consult  #6. Glout flare left hip. Patient with chronic gout pain per chart review.  Home meds include indomethacin and hydrocodone, allopurinol. He was given solumedrol right hip yesterday. No relief. States he "usually gets 2 shots" -solu medrol IM once now -hydrocodone prn -holding allopurinol  #7. CAD/elevated troponin. No chest pain. Troponin slightly elevated but flat. Likely demand.  ekg without acute changes. Of note cardiac cath 2019 noted non-obstructive disease. CT angiogram of chest did note dense calcifications left anterior descending coronary artery.    #8. Hypochromic anemia. Hg 12.6. iron 39 and saturation ratio 9 concerning for IDA.  -OP follow up -consider iron supplement  #9. Morbid obesity. BMI 44.4  #10. OSA  -cpap   Code Status: full Family Communication: patient Disposition Plan: home when clinically ready   Consultants:  cardiology  Procedures:    Antibiotics:    HPI/Subjective: 53 yo hx chf, cad, obesity diabetes admitted with acute respiratory failure secondary to acute on chronic heart failure. Improving slowly  Objective: Vitals:   10/19/18 0545 10/19/18 0911  BP: (!) 188/122 (!) 180/119  Pulse: 89 86  Resp:    Temp:    SpO2:      Intake/Output Summary (Last 24 hours) at 10/19/2018 1007 Last data filed at 10/19/2018 0932 Gross per 24 hour  Intake 1020 ml  Output 4225 ml  Net -3205 ml   Filed Weights   10/17/18 1218 10/18/18 0552 10/19/18 0438  Weight: (!) 156.8 kg (!) 155.1 kg (!) 154.9 kg    Exam:   General:  Sitting on side of bed requesting breakfast  Cardiovascular: rrr no mgr 1-2+ LE edema up to abdomen but slightly less than yesterday  Respiratory: normal effort BS distant but clear no crackles or wheezes  Abdomen: obese firm but less so than yesterday +BS no guarding or rebounding  Musculoskeletal: joints without swelling/erythema   Data Reviewed: Basic Metabolic Panel: Recent Labs  Lab 10/17/18 0556 10/18/18 0350 10/19/18 0736  NA 141 139 140  K 4.1 4.0 3.7  CL 101 98 97*  CO2  28 29 33*  GLUCOSE 226* 318* 189*  BUN 32* 31* 34*  CREATININE 1.46* 1.52* 1.33*  CALCIUM 9.0 8.7* 8.6*   Liver Function Tests: No results for input(s): AST, ALT, ALKPHOS, BILITOT, PROT, ALBUMIN in the last 168 hours. No results for input(s): LIPASE, AMYLASE in the last 168 hours. No results for input(s): AMMONIA in the last 168 hours. CBC: Recent Labs  Lab 10/17/18 0556 10/18/18 0350  WBC 10.5 12.0*  NEUTROABS 6.4 10.6*  HGB 12.3* 12.3*  HCT 40.2 38.3*  MCV 84.6 80.5  PLT 208 229   Cardiac Enzymes: No results for input(s): CKTOTAL, CKMB, CKMBINDEX, TROPONINI in the last 168 hours. BNP (last 3 results) Recent Labs    10/17/18 0556  BNP 92.8    ProBNP (last 3 results) No results for input(s): PROBNP in the last 8760 hours.  CBG: Recent Labs  Lab 10/18/18 0601 10/18/18 1110 10/18/18 1612 10/18/18 2201 10/19/18 0630  GLUCAP 286* 155* 286* 368* 207*    Recent Results (from the past 240 hour(s))  SARS Coronavirus 2 (Hosp order,Performed in [facility] lab via [employer] ID)     Status: None   Collection Time: 10/17/18  5:56 AM   Specimen: Dry Nasal Swab ([employer] [facility] Now)  Result Value Ref Range Status   SARS Coronavirus 2 ([employer] Now) NEGATIVE NEGATIVE Final    Comment: (NOTE) Interpretive Result Comment(s): COVID 19 Positive SARS CoV 2 target nucleic acids are DETECTED. The SARS CoV 2 RNA is generally detectable in upper and lower respiratory specimens during the acute phase of infection.  Positive results are indicative of active infection with SARS CoV 2.  Clinical correlation with patient history and other diagnostic information is necessary to determine patient infection status.  Positive results do not rule out bacterial infection or coinfection with other viruses. The expected result is Negative. COVID 19 Negative SARS CoV 2 target nucleic acids are NOT DETECTED. The SARS CoV 2 RNA is generally detectable in upper and lower respiratory specimens during  the acute phase of infection.  Negative results do not preclude SARS CoV 2 infection, do not rule out coinfections with other pathogens, and should not be used as the sole basis for treatment or other patient management decisions.  Negative results must be combined with clinical  observations, patient history, and epidemiological information. The expected result is Negative. Invalid Presence or absence of SARS CoV 2 nucleic acids cannot be determined. Repeat testing was performed on the submitted specimen and repeated Invalid results were obtained.  If clinically indicated, additional testing on a new specimen with an alternate test methodology [id number]) is advised.  The SARS CoV 2 RNA is generally detectable in upper and lower respiratory specimens during the acute phase of infection. The expected result is Negative. Fact Sheet for Patients:  GolfingFamily.no Fact Sheet for Healthcare Providers: https://www.hernandez-brewer.com/ This test is not yet approved or cleared by the [place] FDA and has been authorized for detection and/or diagnosis of SARS CoV 2 by FDA under an Emergency Use Authorization (EUA).  This EUA will remain in effect (meaning this test can be used) for the duration of the COVID19 d eclaration under Section 564(b)(1) of the Act, 21 U.S.C. section [phone] 3(b)(1), unless the authorization is terminated or revoked sooner.  Performed at [facility], [address] [place], [place] [address]   MRSA PCR Screening     Status: Abnormal   Collection Time: 10/17/18  7:59 PM   Specimen: Nasal Mucosa; Nasopharyngeal  Result Value Ref Range Status   MRSA by PCR (A) NEGATIVE Final    INVALID, UNABLE TO DETERMINE THE PRESENCE OF TARGET DUE TO SPECIMEN INTEGRITY. RECOLLECTION REQUESTED.    Comment: NOTIFIED ATAMBILE,J RN 10/17/2018 AT 0021 SKEEN,P Performed at [facility], [place] [address], [place], [place] [address]    SARS Coronavirus 2 [facility] order, Performed in [facility] hospital lab)     Status: None   Collection Time: 10/17/18  8:57 PM  Result Value Ref Range Status   SARS Coronavirus 2 NEGATIVE NEGATIVE Final    Comment: (NOTE) If result is NEGATIVE SARS-CoV-2 target nucleic acids are NOT DETECTED. The SARS-CoV-2 RNA is generally detectable in upper and lower  respiratory specimens during the acute phase of infection. The lowest  concentration of SARS-CoV-2 viral copies this assay can detect is 250  copies / mL. A negative result does not preclude SARS-CoV-2 infection  and should not be used as the sole basis for treatment or other  patient management decisions.  A negative result may occur with  improper specimen collection / handling, submission of specimen other  than nasopharyngeal swab, presence of viral mutation(s) within the  areas targeted by this assay, and inadequate number of viral copies  (<250 copies / mL). A negative result must be combined with clinical  observations, patient history, and epidemiological information. If result is POSITIVE SARS-CoV-2 target nucleic acids are DETECTED. The SARS-CoV-2 RNA is generally detectable in upper and lower  respiratory specimens dur ing the acute phase of infection.  Positive  results are indicative of active infection with SARS-CoV-2.  Clinical  correlation with patient history and other diagnostic information is  necessary to determine patient infection status.  Positive results do  not rule out bacterial infection or co-infection with other viruses. If result is PRESUMPTIVE POSTIVE SARS-CoV-2 nucleic acids MAY BE PRESENT.   A presumptive positive result was obtained on the submitted specimen  and confirmed on repeat testing.  While 2019 novel coronavirus  (SARS-CoV-2) nucleic acids may be present in the submitted sample  additional confirmatory testing may be necessary for epidemiological  and / or clinical management purposes   to differentiate between  SARS-CoV-2 and other Sarbecovirus currently known to infect humans.  If clinically indicated additional testing with an alternate test  methodology [phone]) is advised. The SARS-CoV-2 RNA is generally  detectable in upper and lower respiratory sp ecimens during the acute  phase of infection. The expected result is Negative. Fact Sheet for Patients:  StrictlyIdeas.no Fact Sheet for Healthcare Providers: BankingDealers.co.za This test is not yet approved or cleared by the [place] FDA and has been authorized for detection and/or diagnosis of SARS-CoV-2 by FDA under an Emergency Use Authorization (EUA).  This EUA will remain in effect (meaning this test can be used) for the duration of the COVID-19 declaration under Section 564(b)(1) of the Act, 21 U.S.C. section 360bbb-3(b)(1), unless the authorization is terminated or revoked sooner. Performed at [facility], [place] [address], [place], [place] [address]   MRSA PCR Screening     Status: None   Collection Time: 10/18/18 12:43 AM   Specimen: Nasal Mucosa; Nasopharyngeal  Result Value Ref Range Status   MRSA by PCR NEGATIVE NEGATIVE Final  Comment:        The GeneXpert MRSA Assay (FDA approved for NASAL specimens only), is one component of a comprehensive MRSA colonization surveillance program. It is not intended to diagnose MRSA infection nor to guide or monitor treatment for MRSA infections. Performed at [facility], [place] [address] [place], [place] [address]      Studies: No results found.  Scheduled Meds: . aspirin EC  81 mg Oral q morning - 10a  . atorvastatin  80 mg Oral q1800  . carvedilol  6.25 mg Oral BID WC  . enoxaparin (LOVENOX) injection  0.5 mg/kg Subcutaneous Q24H  . furosemide  40 mg Intravenous BID  . hydrALAZINE  10 mg Oral Q8H  . insulin aspart  0-20 Units Subcutaneous TID WC  . insulin aspart  0-5 Units  Subcutaneous QHS  . insulin aspart  5 Units Subcutaneous TID WC  . mirtazapine  30 mg Oral QHS  . potassium chloride  20 mEq Oral Daily  . rOPINIRole  4 mg Oral Daily  . rOPINIRole  8 mg Oral QHS  . sodium chloride flush  3 mL Intravenous Q12H   Continuous Infusions: . sodium chloride      Principal Problem:   Acute respiratory failure with hypoxia ([name]) Active Problems:   Essential hypertension   Gout flare   Acute exacerbation of CHF (congestive heart failure) ([name])   Coronary artery disease   Diastolic dysfunction   Acute kidney injury ([name])   DM2 (diabetes mellitus, type 2) ([name])   Morbid obesity ([name])   Restless leg syndrome    Time spent: 48 minutes    [place] NP  [facility]  If 7PM-7AM, please contact night-coverage at [email or web address], password [facility] 10/19/2018, 10:07 AM  LOS: 1 day

## 2018-10-19 NOTE — Progress Notes (Signed)
Patient able to give self Insulin Injections without difficulty.Pt. verbalized he used to be on Insulin before.

## 2018-10-19 NOTE — Plan of Care (Signed)
  Problem: Education: Goal: Knowledge of General Education information will improve Description: Including pain rating scale, medication(s)/side effects and non-pharmacologic comfort measures Outcome: Progressing   Problem: Health Behavior/Discharge Planning: Goal: Ability to manage health-related needs will improve Outcome: Progressing   Problem: Clinical Measurements: Goal: Respiratory complications will improve Outcome: Progressing   

## 2018-10-19 NOTE — Progress Notes (Signed)
Pt stated he did not need any help being placed on CPAP at night. RT will continue to monitor as needed.

## 2018-10-19 NOTE — Progress Notes (Addendum)
Progress Note  Patient Name: Justin Hall Date of Encounter: 10/19/2018  Primary Cardiologist: No primary care provider on file.   Subjective   Patient says he is feeling better today. Shortness of breath improving. He had a rough night due to joint pain/gout. Was able to sleep flat.   Inpatient Medications    Scheduled Meds: . aspirin EC  81 mg Oral q morning - 10a  . atorvastatin  80 mg Oral q1800  . carvedilol  3.125 mg Oral BID WC  . enoxaparin (LOVENOX) injection  0.5 mg/kg Subcutaneous Q24H  . furosemide  40 mg Intravenous BID  . insulin aspart  0-20 Units Subcutaneous TID WC  . insulin aspart  0-5 Units Subcutaneous QHS  . insulin aspart  5 Units Subcutaneous TID WC  . mirtazapine  30 mg Oral QHS  . potassium chloride  20 mEq Oral Daily  . rOPINIRole  4 mg Oral Daily  . rOPINIRole  8 mg Oral QHS  . sodium chloride flush  3 mL Intravenous Q12H   Continuous Infusions: . sodium chloride     PRN Meds: sodium chloride, acetaminophen, clonazePAM, hydrALAZINE, HYDROcodone-acetaminophen, methocarbamol, ondansetron (ZOFRAN) IV, sodium chloride flush   Vital Signs    Vitals:   10/18/18 2205 10/19/18 0044 10/19/18 0438 10/19/18 0545  BP: (!) 118/51 (!) 121/55 (!) 190/115 (!) 188/122  Pulse: 88 91 75 89  Resp:  16 20   Temp:  98.6 F (37 C) 97.7 F (36.5 C)   TempSrc:   Oral   SpO2:  94% 95%   Weight:   (!) 154.9 kg   Height:        Intake/Output Summary (Last 24 hours) at 10/19/2018 0801 Last data filed at 10/19/2018 0446 Gross per 24 hour  Intake 780 ml  Output 3625 ml  Net -2845 ml   Last 3 Weights 10/19/2018 10/18/2018 10/17/2018  Weight (lbs) 341 lb 8 oz 341 lb 14.4 oz 345 lb 11.2 oz  Weight (kg) 154.903 kg 155.085 kg 156.808 kg     Since admission -5L Telemetry    NSR, HR 80s- Personally Reviewed  ECG    No new tracing  Physical Exam   GEN: No acute distress.   Neck: No JVD Cardiac: RRR, no murmurs, rubs, or gallops.  Respiratory: Clear to  auscultation bilaterally. GI: Soft, nontender, non-distended  MS: 2+ edema bilateral; No deformity. Neuro:  Nonfocal  Psych: Normal affect   Labs    High Sensitivity Troponin:   Recent Labs  Lab 10/17/18 0556 10/17/18 0744  TROPONINIHS 23* 24*      Cardiac EnzymesNo results for input(s): TROPONINI in the last 168 hours. No results for input(s): TROPIPOC in the last 168 hours.   Chemistry Recent Labs  Lab 10/17/18 0556 10/18/18 0350  NA 141 139  K 4.1 4.0  CL 101 98  CO2 28 29  GLUCOSE 226* 318*  BUN 32* 31*  CREATININE 1.46* 1.52*  CALCIUM 9.0 8.7*  GFRNONAA 53* 51*  GFRAA >60 59*  ANIONGAP 12 12     Hematology Recent Labs  Lab 10/17/18 0556 10/18/18 0350  WBC 10.5 12.0*  RBC 4.75 4.76  HGB 12.3* 12.3*  HCT 40.2 38.3*  MCV 84.6 80.5  MCH 25.9* 25.8*  MCHC 30.6 32.1  RDW 15.6* 15.4  PLT 208 229    BNP Recent Labs  Lab 10/17/18 0556  BNP 92.8     DDimer No results for input(s): DDIMER in the last 168 hours.  Radiology    No results found.  Cardiac Studies   Echo 6/28 1. The left ventricle has low normal systolic function, with an ejection fraction of 50-55%. The cavity size was normal. There is severely increased left ventricular wall thickness. Left ventricular diastolic Doppler parameters are consistent with  pseudonormalization. Left ventricular diffuse hypokinesis.  2. The right ventricle has normal systolic function. The cavity was normal. There is no increase in right ventricular wall thickness.  3. There is moderate mitral annular calcification present.  4. The inferior vena cava was dilated in size with <50% respiratory variability.  FINDINGS  Left Ventricle: The left ventricle has low normal systolic function, with an ejection fraction of 50-55%.  Patient Profile     53 y.o. male with hx of essential hypertension,nonobstructive coronary artery disease (Cath 2019),diabetes mellitus type 2, obesity, restless leg syndrome,OSA on  CPAP, and gout  admitted with acute respiratory failure related to acute chf/worseining diastolic dysfunction. IV lasix started and echo performed  Assessment & Plan   1. Acute on Chronic Diastolic Heart Failure -on IV lasix 40mg  BID  - patient still volume overloaded -BMET stable - Volume status -5L since admission -weight unchanged since yesterday 341lbs - kidney function is improving with diuresis  2. HTN - patient started on Carvedilol 3.125mg  BID yesterday and B/P last night was better 122/55 -B/P this am recorded 188/122 before medication, HR 80s -will retake with appropriate sized cuff -will increase Coreg to 6.25mg  BID  3. AKI - improving, 1.52>>1.33 -continue to hold NSAIDS and Lisinopril   For questions or updates, please contact [place] Please consult [email or web address] for contact info under        Signed, Cadence Ninfa Meeker, PA-C  10/19/2018, 8:01 AM    ---------------------------------------------------------------------------------------------   History and all data above reviewed.  Patient examined.  I agree with the findings as above.  Justin Hall is feeling much better and continues to diurese adequately. Very motivated to make diet and lifestyle changes. We discussed these in detail today.   Constitutional: No acute distress ENMT: normal dentition, moist mucous membranes Cardiovascular: regular rhythm, normal rate, no murmurs. S1 and S2 normal. Radial pulses normal bilaterally. No jugular venous distention.  Respiratory: clear to auscultation bilaterally GI : normal bowel sounds, soft and nontender. No distention. MSK: extremities warm, well perfused. Trace to 1+ edema.  NEURO: grossly nonfocal exam, moves all extremities. PSYCH: alert and oriented x 3, normal mood and affect.   All available labs, radiology testing, previous records reviewed. Agree with documented assessment and plan of my colleague as stated above with the following additions or  changes:  Principal Problem:   Acute respiratory failure with hypoxia ([place]) Active Problems:   Restless leg syndrome   DM2 (diabetes mellitus, type 2) ([name])   Morbid obesity ([place])   Essential hypertension   Gout flare   Acute exacerbation of CHF (congestive heart failure) ([name])   Coronary artery disease   Diastolic dysfunction   Acute kidney injury ([place])    Plan:   Acute diastolic HF - Continue lasix 40 mg IV BID today. Reassess diuresis tomorrow, consider transition to oral tomorrow afternoon or Thursday morning depending on progress.   Acute renal failure - improving with diuresis, suggests congestion as etiology.   HTN - continues to be elevated. Would prefer addition of once daily amlodipine over hydralazine (challenging for patient compliance at home). Will discontinue po  hydralazine scheduled and prescribe amlodipine. Goal to transition or add back ACE as outpatient  for long term benefit. Will likely need multidrug therapy for HTN. Continue coreg, uptitrate today.  Morbid obesity - discussed diet and lifestyle modification in detail. Patient very motivated to change.   HLD - LDL excellent, will discuss ways to lower triglycerides with patient.   Time Spent Directly with Patient:  I have spent a total of 35 minutes with the patient reviewing hospital notes, telemetry, EKGs, labs and examining the patient as well as establishing an assessment and plan that was discussed personally with the patient.  > 50% of time was spent in direct patient care.  Length of Stay:  LOS: 1 day   Elouise Munroe, MD [facility] 12:04 PM  10/19/2018

## 2018-10-20 DIAGNOSIS — I5031 Acute diastolic (congestive) heart failure: Secondary | ICD-10-CM

## 2018-10-20 LAB — GLUCOSE, CAPILLARY
Glucose-Capillary: 187 mg/dL — ABNORMAL HIGH (ref 70–99)
Glucose-Capillary: 195 mg/dL — ABNORMAL HIGH (ref 70–99)
Glucose-Capillary: 127 mg/dL — ABNORMAL HIGH (ref 70–99)
Glucose-Capillary: 183 mg/dL — ABNORMAL HIGH (ref 70–99)

## 2018-10-20 LAB — BASIC METABOLIC PANEL
Anion gap: 10 (ref 5–15)
BUN: 36 mg/dL — ABNORMAL HIGH (ref 6–20)
CO2: 35 mmol/L — ABNORMAL HIGH (ref 22–32)
Calcium: 8.8 mg/dL — ABNORMAL LOW (ref 8.9–10.3)
Chloride: 98 mmol/L (ref 98–111)
Creatinine, Ser: 1.45 mg/dL — ABNORMAL HIGH (ref 0.61–1.24)
GFR calc Af Amer: >60 mL/min (ref >60)
GFR calc non Af Amer: 54 mL/min — ABNORMAL LOW (ref >60)
Glucose, Bld: 229 mg/dL — ABNORMAL HIGH (ref 70–99)
Potassium: 3.8 mmol/L (ref 3.5–5.1)
Sodium: 143 mmol/L (ref 135–145)

## 2018-10-20 MED ORDER — AMLODIPINE BESYLATE 2.5 MG PO TABS
5.0000 mg | ORAL_TABLET | Freq: Once | ORAL | Status: AC
  Administered 2018-10-20: 5 mg via ORAL
  Filled 2018-10-20: qty 2

## 2018-10-20 MED ORDER — FUROSEMIDE 40 MG PO TABS
40.0000 mg | ORAL_TABLET | Freq: Two times a day (BID) | ORAL | Status: DC
  Administered 2018-10-20 – 2018-10-21 (×3): 40 mg via ORAL
  Filled 2018-10-20 (×3): qty 1

## 2018-10-20 MED ORDER — FUROSEMIDE 10 MG/ML IJ SOLN: 40.0000 mg | Freq: Every day | INTRAMUSCULAR | Status: DC

## 2018-10-20 MED ORDER — HYDRALAZINE HCL 50 MG PO TABS
50.0000 mg | ORAL_TABLET | Freq: Three times a day (TID) | ORAL | Status: DC
  Administered 2018-10-20 – 2018-10-21 (×3): 50 mg via ORAL
  Filled 2018-10-20 (×3): qty 1

## 2018-10-20 MED ORDER — LISINOPRIL 20 MG PO TABS
20.0000 mg | ORAL_TABLET | Freq: Every day | ORAL | Status: DC
  Administered 2018-10-20: 10:00:00 20 mg via ORAL
  Filled 2018-10-20: qty 1

## 2018-10-20 MED ORDER — AMLODIPINE BESYLATE 10 MG PO TABS
10.0000 mg | ORAL_TABLET | Freq: Every day | ORAL | Status: DC
  Administered 2018-10-21: 10 mg via ORAL
  Filled 2018-10-20: qty 1

## 2018-10-20 MED ORDER — HYDRALAZINE HCL 25 MG PO TABS: 25.0000 mg | ORAL_TABLET | Freq: Three times a day (TID) | ORAL | Status: DC

## 2018-10-20 NOTE — Progress Notes (Signed)
Patient placed self on CPAP.  Sterile water in chamber.  RT assistance not needed at this time.  RT will continue to monitor.

## 2018-10-20 NOTE — Progress Notes (Signed)
[facility] PROGRESS NOTE  Justin Hall MHD:[id number] DOB: May 23, 1965 DOA: 10/17/2018 PCP: [name], DO  Assessment/Plan:  #1. Acute respiratory failure with hypoxia related to acute chf/worsening diastolic dysfunction. Resolved this am. Oxygen saturation level greater than 90% on room air. Able to ly flat last night. Echo with EF 50% severely increased left wall thickness.Volume status -9L  -continue Lasix -monitor intake and output -daily weights  #2. Acute chf/worsening diastolic dysfunction.  Diuresing well but remains volume overloaded. Creatinine trending down with diuresis.  See #1. Echo with EF 50%, severely increased left ventricular wll thickness and diffuse hypokinesis. LE edema up to abdomen.  -continue lasix, will defer dosing to cardiology -monitor intake and output -obtain daily weight -Holding lisinopril.  -increase BB per cards  #3. Acute kidney injury. Likely related to above in setting of  Uncontrolled BP, indomethacin use.  Creatinine 1.3 this am trending down from yesterday.  Chart review indicates creatinine within limits of normal 7 months ago.  -monitor -hold nephrotoxins as able -continue Lasix  #4.hypertension. poor control. Home meds include lasix and lisinopril. He was started on low dose BB per cards. That dose increased today.  -holding lisinopril -add po scheduled hydralazine -hydralazine prn -monitor  #5. Diabetes. Uncontrolled. A1c 10.3. Home meds include glyguride and metformin. Patient reports being on 70/30 in past but discontinued due to "insurance".  Appreciate diabetes coordinator input.  -hold oral agents Continue current management.  Will require home on insulin.  #6. Glout flare left hip. Patient with chronic gout pain per chart review. Home meds include indomethacin and hydrocodone, allopurinol. He was given solumedrol right hip yesterday. No relief. States he "usually gets 2 shots" -solu medrol IM once  now -hydrocodone prn -holding allopurinol  #7. CAD/elevated troponin. No chest pain. Troponin slightly elevated but flat. Likely demand.  ekg without acute changes. Of note cardiac cath 2019 noted non-obstructive disease. CT angiogram of chest did note dense calcifications left anterior descending coronary artery.    #8. Hypochromic anemia. Hg 12.6. iron 39 and saturation ratio 9 concerning for IDA.  -OP follow up -consider iron supplement  #9. Morbid obesity. BMI 44.4  #10. OSA  -cpap   Code Status: full Family Communication: patient Disposition Plan: home when clinically ready   Consultants:  cardiology  Procedures:    Antibiotics:    HPI/Subjective: 53 yo hx chf, cad, obesity diabetes admitted with acute respiratory failure secondary to acute on chronic heart failure. Improving slowly  Objective: Vitals:   10/20/18 1435 10/20/18 1940  BP: 128/81 128/83  Pulse: 79 82  Resp:  20  Temp:  (!) 97.4 F (36.3 C)  SpO2: 98% 96%    Intake/Output Summary (Last 24 hours) at 10/20/2018 2021 Last data filed at 10/20/2018 1832 Gross per 24 hour  Intake 1277 ml  Output 3175 ml  Net -1898 ml   Filed Weights   10/18/18 0552 10/19/18 0438 10/20/18 0327  Weight: (!) 155.1 kg (!) 154.9 kg (!) 154 kg    Exam:   General:  Sitting on side of bed requesting breakfast  Cardiovascular: rrr no mgr 1-2+ LE edema up to abdomen but slightly less than yesterday  Respiratory: normal effort BS distant but clear no crackles or wheezes  Abdomen: obese firm but less so than yesterday +BS no guarding or rebounding  Musculoskeletal: joints without swelling/erythema   Data Reviewed: Basic Metabolic Panel: Recent Labs  Lab 10/17/18 0556 10/18/18 0350 10/19/18 0736 10/20/18 0301  NA 141 139 140 143  K 4.1 4.0 3.7 3.8  CL 101 98 97* 98  CO2 28 29 33* 35*  GLUCOSE 226* 318* 189* 229*  BUN 32* 31* 34* 36*  CREATININE 1.46* 1.52* 1.33* 1.45*  CALCIUM 9.0 8.7* 8.6* 8.8*    Liver Function Tests: No results for input(s): AST, ALT, ALKPHOS, BILITOT, PROT, ALBUMIN in the last 168 hours. No results for input(s): LIPASE, AMYLASE in the last 168 hours. No results for input(s): AMMONIA in the last 168 hours. CBC: Recent Labs  Lab 10/17/18 0556 10/18/18 0350  WBC 10.5 12.0*  NEUTROABS 6.4 10.6*  HGB 12.3* 12.3*  HCT 40.2 38.3*  MCV 84.6 80.5  PLT 208 229   Cardiac Enzymes: No results for input(s): CKTOTAL, CKMB, CKMBINDEX, TROPONINI in the last 168 hours. BNP (last 3 results) Recent Labs    10/17/18 0556  BNP 92.8    ProBNP (last 3 results) No results for input(s): PROBNP in the last 8760 hours.  CBG: Recent Labs  Lab 10/19/18 1613 10/19/18 2117 10/20/18 0556 10/20/18 1137 10/20/18 1612  GLUCAP 182* 177* 187* 195* 127*    Recent Results (from the past 240 hour(s))  SARS Coronavirus 2 (Hosp order,Performed in [facility] lab via [employer] ID)     Status: None   Collection Time: 10/17/18  5:56 AM   Specimen: Dry Nasal Swab ([employer]ott ID Now)  Result Value Ref Range Status   SARS Coronavirus 2 (Abbott ID Now) NEGATIVE NEGATIVE Final    Comment: (NOTE) Interpretive Result Comment(s): COVID 19 Positive SARS CoV 2 target nucleic acids are DETECTED. The SARS CoV 2 RNA is generally detectable in upper and lower respiratory specimens during the acute phase of infection.  Positive results are indicative of active infection with SARS CoV 2.  Clinical correlation with patient history and other diagnostic information is necessary to determine patient infection status.  Positive results do not rule out bacterial infection or coinfection with other viruses. The expected result is Negative. COVID 19 Negative SARS CoV 2 target nucleic acids are NOT DETECTED. The SARS CoV 2 RNA is generally detectable in upper and lower respiratory specimens during the acute phase of infection.  Negative results do not preclude SARS CoV 2 infection, do not rule  out coinfections with other pathogens, and should not be used as the sole basis for treatment or other patient management decisions.  Negative results must be combined with clinical  observations, patient history, and epidemiological information. The expected result is Negative. Invalid Presence or absence of SARS CoV 2 nucleic acids cannot be determined. Repeat testing was performed on the submitted specimen and repeated Invalid results were obtained.  If clinically indicated, additional testing on a new specimen with an alternate test methodology [id number]) is advised.  The SARS CoV 2 RNA is generally detectable in upper and lower respiratory specimens during the acute phase of infection. The expected result is Negative. Fact Sheet for Patients:  GolfingFamily.no Fact Sheet for Healthcare Providers: https://www.hernandez-brewer.com/ This test is not yet approved or cleared by the [place] FDA and has been authorized for detection and/or diagnosis of SARS CoV 2 by FDA under an Emergency Use Authorization (EUA).  This EUA will remain in effect (meaning this test can be used) for the duration of the COVID19 d eclaration under Section 564(b)(1) of the Act, 21 U.S.C. section 412 275 3645 3(b)(1), unless the authorization is terminated or revoked sooner. Performed at [facility], [address] [place], [place] [address]   MRSA PCR Screening  Status: Abnormal   Collection Time: 10/17/18  7:59 PM   Specimen: Nasal Mucosa; Nasopharyngeal  Result Value Ref Range Status   MRSA by PCR (A) NEGATIVE Final    INVALID, UNABLE TO DETERMINE THE PRESENCE OF TARGET DUE TO SPECIMEN INTEGRITY. RECOLLECTION REQUESTED.    Comment: NOTIFIED ATAMBILE,J RN 10/17/2018 AT 0021 SKEEN,P Performed at [facility], [place] [address], [place], [place] [address]   SARS Coronavirus 2 [facility] order, Performed in [facility] hospital lab)     Status: None    Collection Time: 10/17/18  8:57 PM  Result Value Ref Range Status   SARS Coronavirus 2 NEGATIVE NEGATIVE Final    Comment: (NOTE) If result is NEGATIVE SARS-CoV-2 target nucleic acids are NOT DETECTED. The SARS-CoV-2 RNA is generally detectable in upper and lower  respiratory specimens during the acute phase of infection. The lowest  concentration of SARS-CoV-2 viral copies this assay can detect is 250  copies / mL. A negative result does not preclude SARS-CoV-2 infection  and should not be used as the sole basis for treatment or other  patient management decisions.  A negative result may occur with  improper specimen collection / handling, submission of specimen other  than nasopharyngeal swab, presence of viral mutation(s) within the  areas targeted by this assay, and inadequate number of viral copies  (<250 copies / mL). A negative result must be combined with clinical  observations, patient history, and epidemiological information. If result is POSITIVE SARS-CoV-2 target nucleic acids are DETECTED. The SARS-CoV-2 RNA is generally detectable in upper and lower  respiratory specimens dur ing the acute phase of infection.  Positive  results are indicative of active infection with SARS-CoV-2.  Clinical  correlation with patient history and other diagnostic information is  necessary to determine patient infection status.  Positive results do  not rule out bacterial infection or co-infection with other viruses. If result is PRESUMPTIVE POSTIVE SARS-CoV-2 nucleic acids MAY BE PRESENT.   A presumptive positive result was obtained on the submitted specimen  and confirmed on repeat testing.  While 2019 novel coronavirus  (SARS-CoV-2) nucleic acids may be present in the submitted sample  additional confirmatory testing may be necessary for epidemiological  and / or clinical management purposes  to differentiate between  SARS-CoV-2 and other Sarbecovirus currently known to infect humans.   If clinically indicated additional testing with an alternate test  methodology [id number]) is advised. The SARS-CoV-2 RNA is generally  detectable in upper and lower respiratory sp ecimens during the acute  phase of infection. The expected result is Negative. Fact Sheet for Patients:  StrictlyIdeas.no Fact Sheet for Healthcare Providers: BankingDealers.co.za This test is not yet approved or cleared by the [place] FDA and has been authorized for detection and/or diagnosis of SARS-CoV-2 by FDA under an Emergency Use Authorization (EUA).  This EUA will remain in effect (meaning this test can be used) for the duration of the COVID-19 declaration under Section 564(b)(1) of the Act, 21 U.S.C. section 360bbb-3(b)(1), unless the authorization is terminated or revoked sooner. Performed at [facility], [place] [address], [place],  [address]   MRSA PCR Screening     Status: None   Collection Time: 10/18/18 12:43 AM   Specimen: Nasal Mucosa; Nasopharyngeal  Result Value Ref Range Status   MRSA by PCR NEGATIVE NEGATIVE Final    Comment:        The GeneXpert MRSA Assay (FDA approved for NASAL specimens only), is one component of  a comprehensive MRSA colonization surveillance program. It is not intended to diagnose MRSA infection nor to guide or monitor treatment for MRSA infections. Performed at [facility], [place] [address], [place],  [address]      Studies: No results found.  Scheduled Meds: . [START ON 10/21/2018] amLODipine  10 mg Oral Daily  . aspirin EC  81 mg Oral q morning - 10a  . atorvastatin  80 mg Oral q1800  . carvedilol  6.25 mg Oral BID WC  . enoxaparin (LOVENOX) injection  0.5 mg/kg Subcutaneous Q24H  . furosemide  40 mg Oral BID  . hydrALAZINE  50 mg Oral Q8H  . insulin aspart  0-5 Units Subcutaneous QHS  . insulin aspart  0-9 Units Subcutaneous TID WC  . insulin aspart protamine- aspart  17  Units Subcutaneous BID WC  . mirtazapine  30 mg Oral QHS  . potassium chloride  20 mEq Oral Daily  . rOPINIRole  4 mg Oral Daily  . rOPINIRole  8 mg Oral QHS  . sodium chloride flush  3 mL Intravenous Q12H   Continuous Infusions: . sodium chloride      Principal Problem:   Acute respiratory failure with hypoxia ([name]) Active Problems:   Restless leg syndrome   DM2 (diabetes mellitus, type 2) ([name])   Morbid obesity ([name])   Essential hypertension   Gout flare   Acute exacerbation of CHF (congestive heart failure) ([name])   Coronary artery disease   Diastolic dysfunction   Acute kidney injury ([place])    Time spent: 45 minutes   Author:  Berle Mull, MD [facility] 10/20/2018   To reach On-call, see care teams to locate the attending and reach out to them via [email or web address]. If 7PM-7AM, please contact night-coverage If you still have difficulty reaching the attending provider, please page the [facility] (Director on Call) for [facility] on amion for assistance.

## 2018-10-20 NOTE — Progress Notes (Signed)
Progress Note  Patient Name: Justin Hall Date of Encounter: 10/20/2018  Primary Cardiologist: Elouise Munroe, MD   Subjective   Frustrated by pain from gout and restless leg. Feels that this is contributing to hypertension.   Inpatient Medications    Scheduled Meds: . amLODipine  5 mg Oral Daily  . aspirin EC  81 mg Oral q morning - 10a  . atorvastatin  80 mg Oral q1800  . carvedilol  6.25 mg Oral BID WC  . enoxaparin (LOVENOX) injection  0.5 mg/kg Subcutaneous Q24H  . [START ON 10/21/2018] furosemide  40 mg Intravenous Daily  . hydrALAZINE  25 mg Oral Q8H  . insulin aspart  0-5 Units Subcutaneous QHS  . insulin aspart  0-9 Units Subcutaneous TID WC  . insulin aspart protamine- aspart  17 Units Subcutaneous BID WC  . lisinopril  20 mg Oral Daily  . mirtazapine  30 mg Oral QHS  . potassium chloride  20 mEq Oral Daily  . rOPINIRole  4 mg Oral Daily  . rOPINIRole  8 mg Oral QHS  . sodium chloride flush  3 mL Intravenous Q12H   Continuous Infusions: . sodium chloride     PRN Meds: sodium chloride, acetaminophen, clonazePAM, hydrALAZINE, HYDROcodone-acetaminophen, methocarbamol, ondansetron (ZOFRAN) IV, sodium chloride flush   Vital Signs    Vitals:   10/19/18 1957 10/20/18 0327 10/20/18 0446 10/20/18 0857  BP: (!) 169/106 (!) 181/113 (!) 176/115 (!) 179/114  Pulse: 80 75 78 82  Resp: 17 18    Temp: (!) 97.2 F (36.2 C) 97.6 F (36.4 C)    TempSrc:  Oral    SpO2: 98% 95%  97%  Weight:  (!) 154 kg    Height:        Intake/Output Summary (Last 24 hours) at 10/20/2018 1042 Last data filed at 10/20/2018 0900 Gross per 24 hour  Intake 1157 ml  Output 3175 ml  Net -2018 ml   Filed Weights   10/18/18 0552 10/19/18 0438 10/20/18 0327  Weight: (!) 155.1 kg (!) 154.9 kg (!) 154 kg    Telemetry    nsr - Personally Reviewed  ECG    No new - Personally Reviewed  Physical Exam   GEN: No acute distress.   Neck: No JVD Cardiac: regular rhythm, normal rate, no  murmurs, rubs, or gallops.  Respiratory: Clear to auscultation bilaterally. GI: Soft, nontender, non-distended  MS: No edema; No deformity. Neuro:  Nonfocal  Psych: Normal affect   Labs    Chemistry Recent Labs  Lab 10/18/18 0350 10/19/18 0736 10/20/18 0301  NA 139 140 143  K 4.0 3.7 3.8  CL 98 97* 98  CO2 29 33* 35*  GLUCOSE 318* 189* 229*  BUN 31* 34* 36*  CREATININE 1.52* 1.33* 1.45*  CALCIUM 8.7* 8.6* 8.8*  GFRNONAA 51* 60* 54*  GFRAA 59* >60 >60  ANIONGAP 12 10 10      Hematology Recent Labs  Lab 10/17/18 0556 10/18/18 0350  WBC 10.5 12.0*  RBC 4.75 4.76  HGB 12.3* 12.3*  HCT 40.2 38.3*  MCV 84.6 80.5  MCH 25.9* 25.8*  MCHC 30.6 32.1  RDW 15.6* 15.4  PLT 208 229    Cardiac EnzymesNo results for input(s): TROPONINI in the last 168 hours. No results for input(s): TROPIPOC in the last 168 hours.   BNP Recent Labs  Lab 10/17/18 0556  BNP 92.8     DDimer No results for input(s): DDIMER in the last 168 hours.   Radiology  No results found.  Cardiac Studies     Patient Profile     53 y.o. male with hx of essential hypertension,nonobstructive coronary artery disease(Cath 2019),diabetes mellitus type 2, obesity, restless leg syndrome,OSA on CPAP, and gout admitted with acute respiratory failure related to acute chf/worseining diastolic dysfunction.  Assessment & Plan   Principal Problem:   Acute respiratory failure with hypoxia ([name]) Active Problems:   Restless leg syndrome   DM2 (diabetes mellitus, type 2) ([name])   Morbid obesity ([name])   Essential hypertension   Gout flare   Acute exacerbation of CHF (congestive heart failure) ([name])   Coronary artery disease   Diastolic dysfunction   Acute kidney injury ([place])   Acute diastolic HF - transition to oral lasix today 40 mg BID. Patient has diuresed well, -7L since admission. No significant change in weight.   HTN - might be driven by gout pain and agitation. Ensure appropriate size cuff  for accurate reading. Will uptitrate amlodipine 10 mg daily. Continue carvedilol, can uptitrate as needed. Would prefer long term medical therapy to be initiated in hospital. Would avoid ACE with renal dysfunction during diuresis. Pt asymptomatic.   [facility] will sign off.   Medication Recommendations:    Carvedilol 6.25 mg BID Amlodipine 10 mg daily Furosemide 40 mg BID ASA 81 mg daily Atorvastatin 80 mg daily  Other recommendations (labs, testing, etc):  Hold lisinopril until follow up.  Follow up as an outpatient:  Dr. Margaretann Loveless July 10 at 10:20 am, will get BMET as well.   For questions or updates, please contact [place] Please consult [email or web address] for contact info under        Signed, Elouise Munroe, MD  10/20/2018, 10:42 AM

## 2018-10-21 DIAGNOSIS — J9601 Acute respiratory failure with hypoxia: Secondary | ICD-10-CM

## 2018-10-21 LAB — BASIC METABOLIC PANEL
Anion gap: 8 (ref 5–15)
BUN: 33 mg/dL — ABNORMAL HIGH (ref 6–20)
CO2: 35 mmol/L — ABNORMAL HIGH (ref 22–32)
Calcium: 8.9 mg/dL (ref 8.9–10.3)
Chloride: 99 mmol/L (ref 98–111)
Creatinine, Ser: 1.55 mg/dL — ABNORMAL HIGH (ref 0.61–1.24)
GFR calc Af Amer: 58 mL/min — ABNORMAL LOW (ref >60)
GFR calc non Af Amer: 50 mL/min — ABNORMAL LOW (ref >60)
Glucose, Bld: 159 mg/dL — ABNORMAL HIGH (ref 70–99)
Potassium: 4 mmol/L (ref 3.5–5.1)
Sodium: 142 mmol/L (ref 135–145)

## 2018-10-21 LAB — GLUCOSE, CAPILLARY
Glucose-Capillary: 170 mg/dL — ABNORMAL HIGH (ref 70–99)
Glucose-Capillary: 120 mg/dL — ABNORMAL HIGH (ref 70–99)

## 2018-10-21 LAB — CBC
HCT: 41.8 % (ref 39.0–52.0)
Hemoglobin: 13.2 g/dL (ref 13.0–17.0)
MCH: 25.8 pg — ABNORMAL LOW (ref 26.0–34.0)
MCHC: 31.6 g/dL (ref 30.0–36.0)
MCV: 81.8 fL (ref 80.0–100.0)
Platelets: 237 10*3/uL (ref 150–400)
RBC: 5.11 MIL/uL (ref 4.22–5.81)
RDW: 15.7 % — ABNORMAL HIGH (ref 11.5–15.5)
WBC: 13.1 10*3/uL — ABNORMAL HIGH (ref 4.0–10.5)
nRBC: 0 % (ref 0.0–0.2)

## 2018-10-21 LAB — MAGNESIUM: Magnesium: 2.3 mg/dL (ref 1.7–2.4)

## 2018-10-21 MED ORDER — NOVOLIN 70/30 FLEXPEN RELION (70-30) 100 UNIT/ML ~~LOC~~ SUPN: 17.0000 [IU] | PEN_INJECTOR | Freq: Two times a day (BID) | SUBCUTANEOUS | 0 refills | Status: DC

## 2018-10-21 MED ORDER — INSULIN PEN NEEDLE 31G X 5 MM MISC: 1.0000 | Freq: Two times a day (BID) | 0 refills | Status: DC

## 2018-10-21 MED ORDER — CARVEDILOL 6.25 MG PO TABS: 6.2500 mg | ORAL_TABLET | Freq: Two times a day (BID) | ORAL | 0 refills | Status: DC

## 2018-10-21 MED ORDER — COLCHICINE 0.6 MG PO TABS: 0.6000 mg | ORAL_TABLET | Freq: Every day | ORAL | 0 refills | Status: DC

## 2018-10-21 MED ORDER — METOPROLOL SUCCINATE ER 25 MG PO TB24: 50.0000 mg | ORAL_TABLET | Freq: Every day | ORAL | 11 refills | Status: DC

## 2018-10-21 MED ORDER — METHOCARBAMOL 500 MG PO TABS: 500.0000 mg | ORAL_TABLET | Freq: Three times a day (TID) | ORAL | 0 refills | Status: DC | PRN

## 2018-10-21 MED ORDER — COLCHICINE 0.6 MG PO TABS
0.6000 mg | ORAL_TABLET | Freq: Every day | ORAL | Status: DC
  Administered 2018-10-21: 0.6 mg via ORAL
  Filled 2018-10-21: qty 1

## 2018-10-21 MED ORDER — BLOOD GLUCOSE METER KIT: PACK | 0 refills | Status: AC

## 2018-10-21 MED ORDER — AMLODIPINE BESYLATE 10 MG PO TABS: 10.0000 mg | ORAL_TABLET | Freq: Every day | ORAL | 0 refills | Status: DC

## 2018-10-21 MED ORDER — HYDROCODONE-ACETAMINOPHEN 7.5-325 MG PO TABS: 1.0000 | ORAL_TABLET | Freq: Four times a day (QID) | ORAL | 0 refills | Status: AC | PRN

## 2018-10-21 MED ORDER — FUROSEMIDE 40 MG PO TABS: 40.0000 mg | ORAL_TABLET | Freq: Two times a day (BID) | ORAL | 0 refills | Status: DC

## 2018-10-21 NOTE — Progress Notes (Signed)
Pt voices concern about left hip and leg pain due to gout. Pt states he was told he could try Colchicine while in hospital. RN cannot find evidence of this in MD notes. Will page on-call MD and continue to monitor.

## 2018-10-21 NOTE — Discharge Instructions (Signed)
Blood Glucose Monitoring, Adult °Monitoring your blood sugar (glucose) is an important part of managing your diabetes (diabetes mellitus). Blood glucose monitoring involves checking your blood glucose as often as directed and keeping a record (log) of your results over time. °Checking your blood glucose regularly and keeping a blood glucose log can: °· Help you and your health care provider adjust your diabetes management plan as needed, including your medicines or insulin. °· Help you understand how food, exercise, illnesses, and medicines affect your blood glucose. °· Let you know what your blood glucose is at any time. You can quickly find out if you have low blood glucose (hypoglycemia) or high blood glucose (hyperglycemia). °Your health care provider will set individualized treatment goals for you. Your goals will be based on your age, other medical conditions you have, and how you respond to diabetes treatment. Generally, the goal of treatment is to maintain the following blood glucose levels: °· Before meals (preprandial): 80-130 mg/dL (4.4-7.2 mmol/L). °· After meals (postprandial): below 180 mg/dL (10 mmol/L). °· A1c level: less than 7%. °Supplies needed: °· Blood glucose meter. °· Test strips for your meter. Each meter has its own strips. You must use the strips that came with your meter. °· A needle to prick your finger (lancet). Do not use a lancet more than one time. °· A device that holds the lancet (lancing device). °· A journal or log book to write down your results. °How to check your blood glucose ° °1. Wash your hands with soap and water. °2. Prick the side of your finger (not the tip) with the lancet. Use a different finger each time. °3. Gently rub the finger until a small drop of blood appears. °4. Follow instructions that come with your meter for inserting the test strip, applying blood to the strip, and using your blood glucose meter. °5. Write down your result and any notes. °Some meters  allow you to use areas of your body other than your finger (alternative sites) to test your blood. The most common alternative sites are: °· Forearm. °· Thigh. °· Palm of the hand. °If you think you may have hypoglycemia, or if you have a history of not knowing when your blood glucose is getting low (hypoglycemia unawareness), do not use alternative sites. Use your finger instead. Alternative sites may not be as accurate as the fingers, because blood flow is slower in these areas. This means that the result you get may be delayed, and it may be different from the result that you would get from your finger. °Follow these instructions at home: °Blood glucose log ° °· Every time you check your blood glucose, write down your result. Also write down any notes about things that may be affecting your blood glucose, such as your diet and exercise for the day. This information can help you and your health care provider: °? Look for patterns in your blood glucose over time. °? Adjust your diabetes management plan as needed. °· Check if your meter allows you to download your records to a computer. Most glucose meters store a record of glucose readings in the meter. °If you have type 1 diabetes: °· Check your blood glucose 2 or more times a day. °· Also check your blood glucose: °? Before every insulin injection. °? Before and after exercise. °? Before meals. °? 2 hours after a meal. °? Occasionally between 2:00 a.m. and 3:00 a.m., as directed. °? Before potentially dangerous tasks, like driving or using heavy machinery. °?   At bedtime. °· You may need to check your blood glucose more often, up to 6-10 times a day, if you: °? Use an insulin pump. °? Need multiple daily injections (MDI). °? Have diabetes that is not well-controlled. °? Are ill. °? Have a history of severe hypoglycemia. °? Have hypoglycemia unawareness. °If you have type 2 diabetes: °· If you take insulin or other diabetes medicines, check your blood glucose 2 or  more times a day. °· If you are on intensive insulin therapy, check your blood glucose 4 or more times a day. Occasionally, you may also need to check between 2:00 a.m. and 3:00 a.m., as directed. °· Also check your blood glucose: °? Before and after exercise. °? Before potentially dangerous tasks, like driving or using heavy machinery. °· You may need to check your blood glucose more often if: °? Your medicine is being adjusted. °? Your diabetes is not well-controlled. °? You are ill. °General tips °· Always keep your supplies with you. °· If you have questions or need help, all blood glucose meters have a 24-hour "hotline" phone number that you can call. You may also contact your health care provider. °· After you use a few boxes of test strips, adjust (calibrate) your blood glucose meter by following instructions that came with your meter. °Contact a health care provider if: °· Your blood glucose is at or above 240 mg/dL (13.3 mmol/L) for 2 days in a row. °· You have been sick or have had a fever for 2 days or longer, and you are not getting better. °· You have any of the following problems for more than 6 hours: °? You cannot eat or drink. °? You have nausea or vomiting. °? You have diarrhea. °Get help right away if: °· Your blood glucose is lower than 54 mg/dL (3 mmol/L). °· You become confused or you have trouble thinking clearly. °· You have difficulty breathing. °· You have moderate or large ketone levels in your urine. °Summary °· Monitoring your blood sugar (glucose) is an important part of managing your diabetes (diabetes mellitus). °· Blood glucose monitoring involves checking your blood glucose as often as directed and keeping a record (log) of your results over time. °· Your health care provider will set individualized treatment goals for you. Your goals will be based on your age, other medical conditions you have, and how you respond to diabetes treatment. °· Every time you check your blood glucose,  write down your result. Also write down any notes about things that may be affecting your blood glucose, such as your diet and exercise for the day. °This information is not intended to replace advice given to you by your health care provider. Make sure you discuss any questions you have with your health care provider. °Document Released: 04/10/2003 Document Revised: 01/29/2018 Document Reviewed: 09/17/2015 °Elsevier Patient Education © 2020 [employer] ° °

## 2018-10-23 NOTE — Discharge Summary (Signed)
[facility] Discharge Summary   Patient: Justin Hall VEH:[id number]   PCP: [name], DO DOB: Dec 05, 1965   Date of admission: 10/17/2018   Date of discharge: 10/21/2018     Discharge Diagnoses:  Principal Problem:   Acute respiratory failure with hypoxia ([place]) Active Problems:   Restless leg syndrome   DM2 (diabetes mellitus, type 2) ([place])   Morbid obesity ([place])   Essential hypertension   Gout flare   Acute exacerbation of CHF (congestive heart failure) ([place])   Coronary artery disease   Diastolic dysfunction   Acute kidney injury ([place])  Admitted From: home Disposition:  Home   Recommendations for Outpatient Follow-up:  1. Please follow-up with PCP in 1 week.  Cardiology as recommended.  Follow-up Information    [name], DO. Go on 10/27/2018.   Specialty: Family Medicine Why: _0 :00 am -needs a repeat BMP in 1 week. Also needs to adjust insulin.  Contact information: [address] [place] [address] [phone]          Diet recommendation: Cardiac diet carb verified  Activity: The patient is advised to gradually reintroduce usual activities,as tolerated .  Discharge Condition: good  Code Status: Full code  History of present illness: As per the H and P dictated on admission, " Justin Hall is a 53 y.o. male with medical history significant of essential hypertension, nonobstructive coronary artery disease, diabetes mellitus type 2, obesity, restless leg syndrome, OSA on CPAP, and gout; who presents with complaints of progressively worsening shortness of breath over the last 2 weeks.  He complains of having bilateral lower extremity swelling most notably around his ankles with dyspnea with any significant exertion.  Reports being forced to sleep in a recliner due to worsening shortness of breath with laying flat.  Associated symptoms include abdominal swelling, left hip pain that feels like he is having a gout flare.   He has been taking indomethacin for the symptoms without complete relief.  Denies having any significant chest pain, palpitations, fever, dysuria, nausea, vomiting, diarrhea.  His last fall was a couple weeks ago, but denies any significant injury.  Patient had been following up through tele-visits with his primary doctor and had been started on furosemide for the symptoms.  However symptoms progressively worsened overnight to the point that he was unable to make it to his follow-up visit scheduled for tomorrow.  Patient had previously had a cardiac catheterization in 05/2017 with EF 65 - 70% with grade 1 diastolic dysfunction at that time.  ED Course: Upon admission into the emergency department patient was noted to be afebrile, blood pressure 169/124, O2 saturations noted to be as low as 88-91% after ambulation, and all other vital signs maintained.  Labs revealed WBC 10.5, hemoglobin 12.3 (with low MCH and elevated RDW), BNP 92.8, high-sensitivity troponin 23, BUN 32, creatinine 1.46. COVID-19 screening negative. CTArevealed noPE, cardiomegaly without edema, coronary artery calcifications particularly dense within the left anterior descending coronary artery, platelike conversation in the right lung base suspected to be atelectasis, and 5 mm nodular density in the right upper lobe of the lung.He was given 40 mg of Lasix IV. Accepted as observation to a telemetry bedfor suspected CHF exacerbation."  Hospital Course:  Summary of his active problems in the hospital is as following. #1. Acute respiratory failure with hypoxia related to acute chf/worsening diastolic dysfunction.  Hypoxia Resolved Oxygen saturation level greater than 90% on room air.  Echo with EF 50% severely increased left  wall thickness.Volume status -continue Lasix  #2. Acute chf/worsening diastolic dysfunction.   Diuresing well but remains volume overloaded. -Holding lisinopril. -increase BB per cards  #3. Acute kidney  injury. Likely related to above in setting of Uncontrolled BP, indomethacin use. indicates creatinine within limits of normal 7 months ago.  -hold nephrotoxins as able -continue Lasix  #4.hypertension. poor control. Home meds include lasix and lisinopril. Hold Lasix, continue beta-blocker.  #5.  Type II diabetes. Uncontrolled. A1c 10.3.  Home meds include glyguride and metformin. Patient reports being on 70/30 in past but discontinued due to "insurance".  Appreciate diabetes coordinator input.  Discharging on home insulin along with home regimen.  Eventually benefit from [place] or similar medication.  #6. Glout flare left hip. Patient with chronic gout pain per chart review. Home meds include indomethacin and hydrocodone, allopurinol. He was given solumedrol right hip yesterday. No relief. States he "usually gets 2 shots" -hydrocodone prn -holding allopurinol, added colchicine.  #7. CAD/elevated troponin. No chest pain. Troponin slightly elevated but flat. Likely demand. ekg without acute changes. Of note cardiac cath 2019 noted non-obstructive disease. CT angiogram of chest did note dense calcifications left anterior descending coronary artery.   #8. Hypochromic anemia. Hg 12.6. iron 39 and saturation ratio 9 concerning for IDA.  -OP follow up -consider iron supplement  #9. Morbid obesity. BMI 44.4  #10. OSA  -cpap  Pain control  - [facility] Controlled Substance Reporting System database was reviewed. - 3 day supply was provided. - Patient was instructed, not to drive, operate heavy machinery, perform activities at heights, swimming or participation in water activities or provide baby sitting services while on Pain, Sleep and Anxiety Medications; until his outpatient Physician has advised to do so again.  - Also recommended to not to take more than prescribed Pain, Sleep and Anxiety Medications.  Patient was ambulatory without any assistance. On the day of the  discharge the patient's vitals were stable, and no other acute medical condition were reported by patient. the patient was felt safe to be discharge at Home with no therapy needed on discharge.  Consultants: cardiology  Procedures: none  DISCHARGE MEDICATION: Allergies as of 10/21/2018   No Known Allergies     Medication List    STOP taking these medications   indomethacin 50 MG capsule Commonly known as: INDOCIN   lisinopril 20 MG tablet Commonly known as: ZESTRIL     TAKE these medications   allopurinol 300 MG tablet Commonly known as: ZYLOPRIM Take 300 mg by mouth 2 (two) times daily. What changed: Another medication with the same name was changed. Make sure you understand how and when to take each.   allopurinol 100 MG tablet Commonly known as: ZYLOPRIM Take 1 tablet (100 mg total) by mouth daily. Total of 400 mg in morning and 300 mg in evening. What changed: additional instructions   amLODipine 10 MG tablet Commonly known as: NORVASC Take 1 tablet (10 mg total) by mouth daily.   aspirin EC 81 MG tablet Take 81 mg by mouth every morning.   atorvastatin 80 MG tablet Commonly known as: LIPITOR Take 80 mg by mouth daily at 6 PM.   blood glucose meter kit and supplies Dispense based on patient and insurance preference. Use up to four times daily as directed. (FOR ICD-10 E10.9, E11.9).   clonazePAM 1 MG tablet Commonly known as: KLONOPIN Take 1 tablet (1 mg total) by mouth daily as needed for anxiety.   colchicine 0.6 MG tablet Take  1 tablet (0.6 mg total) by mouth daily for 7 days.   furosemide 40 MG tablet Commonly known as: LASIX Take 1 tablet (40 mg total) by mouth 2 (two) times daily. What changed: when to take this   glyBURIDE 5 MG tablet Commonly known as: DIABETA Take 1 tablet (5 mg total) by mouth daily with breakfast.   HYDROcodone-acetaminophen 7.5-325 MG tablet Commonly known as: NORCO Take 1 tablet by mouth every 6 (six) hours as needed for up  to 3 days for moderate pain.   Insulin Pen Needle 31G X 5 MM Misc 1 Act by Does not apply route 2 (two) times daily before a meal.   metFORMIN 1000 MG tablet Commonly known as: GLUCOPHAGE Take 1 tablet (1,000 mg total) by mouth 2 (two) times daily with a meal.   methocarbamol 500 MG tablet Commonly known as: ROBAXIN Take 1 tablet (500 mg total) by mouth every 8 (eight) hours as needed for muscle spasms.   metoprolol succinate 25 MG 24 hr tablet Commonly known as: Toprol XL Take 2 tablets (50 mg total) by mouth daily.   mirtazapine 30 MG tablet Commonly known as: REMERON Take 1 tablet (30 mg total) by mouth at bedtime. What changed:   when to take this  reasons to take this   NovoLIN 70/30 FlexPen Relion (70-30) 100 UNIT/ML PEN Generic drug: Insulin Isophane & Regular Human Inject 17 Units into the skin 2 (two) times daily before a meal.   potassium chloride 10 MEQ tablet Commonly known as: K-DUR Take 2 tabs with each dose of Lasix. What changed:   how much to take  how to take this  when to take this  additional instructions   rOPINIRole 4 MG tablet Commonly known as: REQUIP Take 1 tablet (4 mg total) by mouth 2 (two) times daily. What changed:   when to take this  additional instructions      No Known Allergies Discharge Instructions    Diet - low sodium heart healthy   Complete by: As directed    Discharge instructions   Complete by: As directed    It is important that you read the given instructions as well as go over your medication list with RN to help you understand your care after this hospitalization.  Discharge Instructions: Please follow-up with PCP in 1-2 weeks  Please request your primary care physician to go over all Hospital Tests and Procedure/Radiological results at the follow up. Please get all Hospital records sent to your PCP by signing hospital release before you go home.   Do not take more than prescribed Pain, Sleep and Anxiety  Medications. You were cared for by a hospitalist during your hospital stay. If you have any questions about your discharge medications or the care you received while you were in the hospital after you are discharged, you can call the unit _0 @ you were admitted to and ask to speak with the hospitalist on call if the hospitalist that took care of you is not available.  Once you are discharged, your primary care physician will handle any further medical issues. Please note that NO REFILLS for any discharge medications will be authorized once you are discharged, as it is imperative that you return to your primary care physician (or establish a relationship with a primary care physician if you do not have one) for your aftercare needs so that they can reassess your need for medications and monitor your lab values. You Must read complete instructions/literature along  with all the possible adverse reactions/side effects for all the Medicines you take and that have been prescribed to you. Take any new Medicines after you have completely understood and accept all the possible adverse reactions/side effects. Wear Seat belts while driving. If you have smoked or chewed Tobacco in the last 2 yrs please stop smoking and/or stop any Recreational drug use.  If you drink alcohol, please STOP the use and do not drive, operating heavy machinery, perform activities at heights, swimming or participation in water activities or provide baby sitting services under influence.   Increase activity slowly   Complete by: As directed      Discharge Exam: Filed Weights   10/19/18 0438 10/20/18 0327 10/21/18 0352  Weight: (!) 154.9 kg (!) 154 kg (!) 154 kg   Vitals:   10/21/18 0035 10/21/18 0435  BP: 123/78 (!) 147/89  Pulse: 80 81  Resp: 20 20  Temp: 97.7 F (36.5 C) 98 F (36.7 C)  SpO2: 94% 97%   General: Appear in no distress, no Rash; Oral Mucosa Clear, moist. no Abnormal Mass Or lumps Cardiovascular: S1 and S2  Present, no Murmur, Respiratory: normal respiratory effort, Bilateral Air entry present and Clear to Auscultation, no Crackles, no wheezes Abdomen: Bowel Sound present, Soft and no tenderness, no hernia Extremities: bilateral  Pedal edema, no calf tenderness Neurology: alert and oriented to time, place, and person affect appropriate. normal without focal findings, mental status, speech normal, alert and oriented x3, PERLA, Motor strength 5/5 and symmetric and sensation grossly normal to light touch   The results of significant diagnostics from this hospitalization (including imaging, microbiology, ancillary and laboratory) are listed below for reference.    Significant Diagnostic Studies: Ct Angio Chest Pe W And/or Wo Contrast  Result Date: 10/17/2018 CLINICAL DATA:  Shortness of breath, worsening over the week, swollen legs. EXAM: CT ANGIOGRAPHY CHEST WITH CONTRAST TECHNIQUE: Multidetector CT imaging of the chest was performed using the standard protocol during bolus administration of intravenous contrast. Multiplanar CT image reconstructions and MIPs were obtained to evaluate the vascular anatomy. CONTRAST:  93m OMNIPAQUE IOHEXOL 350 MG/ML SOLN COMPARISON:  None. FINDINGS: Cardiovascular: Some of the most peripheral segmental and subsegmental pulmonary artery branches cannot be definitively characterized due to mild patient breathing motion artifact, however, there is no pulmonary embolism identified within the main, lobar or central segmental pulmonary arteries bilaterally. No thoracic aortic aneurysm or evidence of aortic dissection. Cardiomegaly. No pericardial effusion. Coronary artery calcifications noted, particularly dense within the LEFT anterior descending coronary artery. Mediastinum/Nodes: No mass or enlarged lymph nodes within the mediastinum or perihilar regions. Esophagus appears normal. Trachea and central bronchi are unremarkable. Lungs/Pleura: Platelike consolidation at the RIGHT lung  base, likely atelectasis mosaic pattern bilaterally suggests air trapping. 5 mm nodular density appreciated within the inferior-lateral portion of the RIGHT upper lobe (series 6, image 37). No pleural effusion or pneumothorax. Upper Abdomen: Limited images of the upper abdomen are unremarkable. 3 mm LEFT renal stone. Musculoskeletal: No acute or suspicious osseous finding. Degenerative spondylosis of the thoracic spine, mild to moderate in degree. Degenerative changes at both shoulders, incompletely imaged. Review of the MIP images confirms the above findings. IMPRESSION: 1. No pulmonary embolism identified, with mild study limitations detailed above. 2. Cardiomegaly. No pericardial effusion. Coronary artery calcifications, particularly dense within the left anterior descending coronary artery. Recommend correlation with any possible associated cardiac symptoms. 3. Platelike consolidation at the right lung base, compatible with atelectasis, likely chronic. Evidence of air trapping bilaterally.  No evidence of pneumonia. 4. 5 mm nodular density within the inferior-lateral portion of the right upper lobe. No follow-up needed if patient is low-risk. Non-contrast chest CT can be considered in 12 months if patient is high-risk. This recommendation follows the consensus statement: Guidelines for Management of Incidental Pulmonary Nodules Detected on CT Images: From the Fleischner Society 2017; Radiology 2017; 284:228-243. 5. 3 mm nonobstructing left renal stone. Electronically Signed   By: Franki Cabot M.D.   On: 10/17/2018 07:07   Dg Chest Portable 1 View  Result Date: 10/17/2018 CLINICAL DATA:  Shortness of breath EXAM: PORTABLE CHEST 1 VIEW COMPARISON:  09/01/2018 FINDINGS: The heart size and mediastinal contours are within normal limits. Both lungs are clear. The visualized skeletal structures are unremarkable. IMPRESSION: No active disease. Electronically Signed   By: Ulyses Jarred M.D.   On: 10/17/2018 06:11     Microbiology: Recent Results (from the past 240 hour(s))  SARS Coronavirus 2 (Hosp order,Performed in [facility] lab via [employer] ID)     Status: None   Collection Time: 10/17/18  5:56 AM   Specimen: Dry Nasal Swab ([employer] ID Now)  Result Value Ref Range Status   SARS Coronavirus 2 ([employer] Now) NEGATIVE NEGATIVE Final    Comment: (NOTE) Interpretive Result Comment(s): COVID 19 Positive SARS CoV 2 target nucleic acids are DETECTED. The SARS CoV 2 RNA is generally detectable in upper and lower respiratory specimens during the acute phase of infection.  Positive results are indicative of active infection with SARS CoV 2.  Clinical correlation with patient history and other diagnostic information is necessary to determine patient infection status.  Positive results do not rule out bacterial infection or coinfection with other viruses. The expected result is Negative. COVID 19 Negative SARS CoV 2 target nucleic acids are NOT DETECTED. The SARS CoV 2 RNA is generally detectable in upper and lower respiratory specimens during the acute phase of infection.  Negative results do not preclude SARS CoV 2 infection, do not rule out coinfections with other pathogens, and should not be used as the sole basis for treatment or other patient management decisions.  Negative results must be combined with clinical  observations, patient history, and epidemiological information. The expected result is Negative. Invalid Presence or absence of SARS CoV 2 nucleic acids cannot be determined. Repeat testing was performed on the submitted specimen and repeated Invalid results were obtained.  If clinically indicated, additional testing on a new specimen with an alternate test methodology [phone]) is advised.  The SARS CoV 2 RNA is generally detectable in upper and lower respiratory specimens during the acute phase of infection. The expected result is Negative. Fact Sheet for Patients:   GolfingFamily.no Fact Sheet for Healthcare Providers: https://www.hernandez-brewer.com/ This test is not yet approved or cleared by the [place] FDA and has been authorized for detection and/or diagnosis of SARS CoV 2 by FDA under an Emergency Use Authorization (EUA).  This EUA will remain in effect (meaning this test can be used) for the duration of the COVID19 d eclaration under Section 564(b)(1) of the Act, 21 U.S.C. section [phone] 3(b)(1), unless the authorization is terminated or revoked sooner. Performed at [facility], [address] [place], [place] [address]   MRSA PCR Screening     Status: Abnormal   Collection Time: 10/17/18  7:59 PM   Specimen: Nasal Mucosa; Nasopharyngeal  Result Value Ref Range Status   MRSA by PCR (A) NEGATIVE Final    INVALID,  UNABLE TO DETERMINE THE PRESENCE OF TARGET DUE TO SPECIMEN INTEGRITY. RECOLLECTION REQUESTED.    Comment: NOTIFIED ATAMBILE,J RN 10/17/2018 AT 0021 SKEEN,P Performed at [facility], [place] [address], [place], [place] [address]   SARS Coronavirus 2 [facility] order, Performed in [facility] hospital lab)     Status: None   Collection Time: 10/17/18  8:57 PM  Result Value Ref Range Status   SARS Coronavirus 2 NEGATIVE NEGATIVE Final    Comment: (NOTE) If result is NEGATIVE SARS-CoV-2 target nucleic acids are NOT DETECTED. The SARS-CoV-2 RNA is generally detectable in upper and lower  respiratory specimens during the acute phase of infection. The lowest  concentration of SARS-CoV-2 viral copies this assay can detect is 250  copies / mL. A negative result does not preclude SARS-CoV-2 infection  and should not be used as the sole basis for treatment or other  patient management decisions.  A negative result may occur with  improper specimen collection / handling, submission of specimen other  than nasopharyngeal swab, presence of viral mutation(s) within the  areas targeted  by this assay, and inadequate number of viral copies  (<250 copies / mL). A negative result must be combined with clinical  observations, patient history, and epidemiological information. If result is POSITIVE SARS-CoV-2 target nucleic acids are DETECTED. The SARS-CoV-2 RNA is generally detectable in upper and lower  respiratory specimens dur ing the acute phase of infection.  Positive  results are indicative of active infection with SARS-CoV-2.  Clinical  correlation with patient history and other diagnostic information is  necessary to determine patient infection status.  Positive results do  not rule out bacterial infection or co-infection with other viruses. If result is PRESUMPTIVE POSTIVE SARS-CoV-2 nucleic acids MAY BE PRESENT.   A presumptive positive result was obtained on the submitted specimen  and confirmed on repeat testing.  While 2019 novel coronavirus  (SARS-CoV-2) nucleic acids may be present in the submitted sample  additional confirmatory testing may be necessary for epidemiological  and / or clinical management purposes  to differentiate between  SARS-CoV-2 and other Sarbecovirus currently known to infect humans.  If clinically indicated additional testing with an alternate test  methodology [phone]) is advised. The SARS-CoV-2 RNA is generally  detectable in upper and lower respiratory sp ecimens during the acute  phase of infection. The expected result is Negative. Fact Sheet for Patients:  StrictlyIdeas.no Fact Sheet for Healthcare Providers: BankingDealers.co.za This test is not yet approved or cleared by the [place] FDA and has been authorized for detection and/or diagnosis of SARS-CoV-2 by FDA under an Emergency Use Authorization (EUA).  This EUA will remain in effect (meaning this test can be used) for the duration of the COVID-19 declaration under Section 564(b)(1) of the Act, 21 U.S.C. section  360bbb-3(b)(1), unless the authorization is terminated or revoked sooner. Performed at [facility], [place] [address], [place], [place] [address]   MRSA PCR Screening     Status: None   Collection Time: 10/18/18 12:43 AM   Specimen: Nasal Mucosa; Nasopharyngeal  Result Value Ref Range Status   MRSA by PCR NEGATIVE NEGATIVE Final    Comment:        The GeneXpert MRSA Assay (FDA approved for NASAL specimens only), is one component of a comprehensive MRSA colonization surveillance program. It is not intended to diagnose MRSA infection nor to guide or monitor treatment for MRSA infections. Performed at [facility], [place],  [place] [address]      Labs: CBC: Recent Labs  Lab 10/17/18 0556 10/18/18 0350 10/21/18 0439  WBC 10.5 12.0* 13.1*  NEUTROABS 6.4 10.6*  --   HGB 12.3* 12.3* 13.2  HCT 40.2 38.3* 41.8  MCV 84.6 80.5 81.8  PLT 208 229 371   Basic Metabolic Panel: Recent Labs  Lab 10/17/18 0556 10/18/18 0350 10/19/18 0736 10/20/18 0301 10/21/18 0439  NA 141 139 140 143 142  K 4.1 4.0 3.7 3.8 4.0  CL 101 98 97* 98 99  CO2 28 29 33* 35* 35*  GLUCOSE 226* 318* 189* 229* 159*  BUN 32* 31* 34* 36* 33*  CREATININE 1.46* 1.52* 1.33* 1.45* 1.55*  CALCIUM 9.0 8.7* 8.6* 8.8* 8.9  MG  --   --   --   --  2.3   Liver Function Tests: No results for input(s): AST, ALT, ALKPHOS, BILITOT, PROT, ALBUMIN in the last 168 hours. No results for input(s): LIPASE, AMYLASE in the last 168 hours. No results for input(s): AMMONIA in the last 168 hours. Cardiac Enzymes: No results for input(s): CKTOTAL, CKMB, CKMBINDEX, TROPONINI in the last 168 hours. BNP (last 3 results) Recent Labs    10/17/18 0556  BNP 92.8   CBG: Recent Labs  Lab 10/20/18 1137 10/20/18 1612 10/20/18 2145 10/21/18 0629 10/21/18 1126  GLUCAP 195* 127* 183* 170* 120*   Time spent: 35 minutes  Signed:  Berle Mull  [facility] 10/21/2018

## 2018-10-27 ENCOUNTER — Inpatient Hospital Stay: Payer: Self-pay | Admitting: Family Medicine

## 2018-10-28 ENCOUNTER — Telehealth: Payer: Self-pay | Admitting: Internal Medicine

## 2018-10-28 NOTE — Telephone Encounter (Signed)
I called pt. There was no answer and no voicemail.

## 2018-10-29 ENCOUNTER — Other Ambulatory Visit: Payer: Self-pay

## 2018-10-29 ENCOUNTER — Ambulatory Visit (INDEPENDENT_AMBULATORY_CARE_PROVIDER_SITE_OTHER): Payer: Self-pay | Admitting: Internal Medicine

## 2018-10-29 ENCOUNTER — Encounter: Payer: Self-pay | Admitting: Internal Medicine

## 2018-10-29 VITALS — BP 169/109 | HR 81 | Temp 97.5°F | Ht 70.0 in | Wt 333.6 lb

## 2018-10-29 DIAGNOSIS — N179 Acute kidney failure, unspecified: Secondary | ICD-10-CM

## 2018-10-29 DIAGNOSIS — E119 Type 2 diabetes mellitus without complications: Secondary | ICD-10-CM

## 2018-10-29 DIAGNOSIS — I10 Essential (primary) hypertension: Secondary | ICD-10-CM

## 2018-10-29 DIAGNOSIS — I5032 Chronic diastolic (congestive) heart failure: Secondary | ICD-10-CM

## 2018-10-29 MED ORDER — CARVEDILOL 6.25 MG PO TABS: 6.2500 mg | ORAL_TABLET | Freq: Two times a day (BID) | ORAL | 3 refills | Status: DC

## 2018-10-29 NOTE — Patient Instructions (Addendum)
Medication Instructions:   stop taking metoprolol   restart CARVEDILOL 6.25 MG TWICE A DAY    If you need a refill on your cardiac medications before your next appointment, please call your pharmacy.   Lab work:  PLEASE HAVE PRIMARY SEND LABS THAT ARE SCHEDULE FOR NEXT WEEK TO THE OFFICE  .  Testing/Procedures: NOT NEEDED  Follow-Up: At [facility], you and your health needs are our priority.  As part of our continuing mission to provide you with exceptional heart care, we have created designated Provider Care Teams.  These Care Teams include your primary Cardiologist (physician) and Advanced Practice Providers (APPs -  Physician Assistants and Nurse Practitioners) who all work together to provide you with the care you need, when you need it. . You will need a follow up appointment in  3 TO 4 WEEKS - VIRTUAL.  Please call our office 2 months in advance to schedule this appointment.  You may see Elouise Munroe, MD*or one of the following Advanced Practice Providers on your designated Care Team:   . Rosaria Ferries, PA-C . Jory Sims, DNP, ANP  Any Other Special Instructions Will Be Listed Below .
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
## 2018-10-30 ENCOUNTER — Other Ambulatory Visit: Payer: Self-pay | Admitting: Family Medicine

## 2018-11-01 ENCOUNTER — Encounter: Payer: Self-pay | Admitting: Family Medicine

## 2018-11-01 ENCOUNTER — Other Ambulatory Visit: Payer: Self-pay

## 2018-11-01 ENCOUNTER — Other Ambulatory Visit: Payer: Self-pay | Admitting: Family Medicine

## 2018-11-01 ENCOUNTER — Ambulatory Visit (INDEPENDENT_AMBULATORY_CARE_PROVIDER_SITE_OTHER): Payer: Self-pay | Admitting: Family Medicine

## 2018-11-01 VITALS — BP 142/90 | HR 81 | Temp 98.1°F | Ht 70.0 in | Wt 329.0 lb

## 2018-11-01 DIAGNOSIS — R0609 Other forms of dyspnea: Secondary | ICD-10-CM

## 2018-11-01 DIAGNOSIS — I10 Essential (primary) hypertension: Secondary | ICD-10-CM

## 2018-11-01 DIAGNOSIS — R0600 Dyspnea, unspecified: Secondary | ICD-10-CM

## 2018-11-01 LAB — BASIC METABOLIC PANEL
BUN: 28 mg/dL — ABNORMAL HIGH (ref 6–23)
CO2: 30 mEq/L (ref 19–32)
Calcium: 9 mg/dL (ref 8.4–10.5)
Chloride: 100 mEq/L (ref 96–112)
Creatinine, Ser: 1.23 mg/dL (ref 0.40–1.50)
GFR: 61.01 mL/min (ref >60.00)
Glucose, Bld: 121 mg/dL — ABNORMAL HIGH (ref 70–99)
Potassium: 4.2 mEq/L (ref 3.5–5.1)
Sodium: 142 mEq/L (ref 135–145)

## 2018-11-01 MED ORDER — LISINOPRIL 20 MG PO TABS: 20.0000 mg | ORAL_TABLET | Freq: Every day | ORAL | 2 refills | Status: DC

## 2018-11-01 MED ORDER — POTASSIUM CHLORIDE CRYS ER 10 MEQ PO TBCR: 20.0000 meq | EXTENDED_RELEASE_TABLET | Freq: Every day | ORAL | 3 refills | Status: DC

## 2018-11-01 MED ORDER — FUROSEMIDE 40 MG PO TABS: 40.0000 mg | ORAL_TABLET | Freq: Two times a day (BID) | ORAL | 3 refills | Status: DC

## 2018-11-01 MED ORDER — HYDROCODONE-ACETAMINOPHEN 7.5-325 MG PO TABS: 1.0000 | ORAL_TABLET | Freq: Four times a day (QID) | ORAL | 0 refills | Status: DC | PRN

## 2018-11-01 NOTE — Patient Instructions (Addendum)
Give [place] 2-3 business days to get the results of your labs back.   Keep the diet clean and stay active.  Check your blood sugars and pressures at home. Send me a message in 3 weeks.  Keep an eye on your weights.   Let us know if you need anything.
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
## 2018-11-02 ENCOUNTER — Other Ambulatory Visit: Payer: Self-pay | Admitting: Family Medicine

## 2018-11-02 MED ORDER — LISINOPRIL 20 MG PO TABS: 20.0000 mg | ORAL_TABLET | Freq: Every day | ORAL | 2 refills | Status: DC

## 2018-11-08 ENCOUNTER — Other Ambulatory Visit: Payer: Self-pay

## 2018-11-08 ENCOUNTER — Other Ambulatory Visit (INDEPENDENT_AMBULATORY_CARE_PROVIDER_SITE_OTHER): Payer: Self-pay

## 2018-11-08 DIAGNOSIS — R0609 Other forms of dyspnea: Secondary | ICD-10-CM

## 2018-11-08 DIAGNOSIS — M109 Gout, unspecified: Secondary | ICD-10-CM

## 2018-11-08 DIAGNOSIS — R0600 Dyspnea, unspecified: Secondary | ICD-10-CM

## 2018-11-08 LAB — BASIC METABOLIC PANEL
BUN: 28 mg/dL — ABNORMAL HIGH (ref 6–23)
CO2: 28 mEq/L (ref 19–32)
Calcium: 9.1 mg/dL (ref 8.4–10.5)
Chloride: 101 mEq/L (ref 96–112)
Creatinine, Ser: 1.27 mg/dL (ref 0.40–1.50)
GFR: 58.8 mL/min — ABNORMAL LOW (ref >60.00)
Glucose, Bld: 193 mg/dL — ABNORMAL HIGH (ref 70–99)
Potassium: 4.2 mEq/L (ref 3.5–5.1)
Sodium: 140 mEq/L (ref 135–145)

## 2018-11-08 LAB — URIC ACID: Uric Acid, Serum: 6.7 mg/dL (ref 4.0–7.8)

## 2018-11-13 ENCOUNTER — Encounter (HOSPITAL_COMMUNITY): Payer: Self-pay | Admitting: Emergency Medicine

## 2018-11-13 ENCOUNTER — Emergency Department (HOSPITAL_COMMUNITY)
Admission: EM | Admit: 2018-11-13 | Discharge: 2018-11-13 | Disposition: A | Discharge status: Home / Self Care | Payer: Self-pay | Attending: Emergency Medicine | Admitting: Emergency Medicine

## 2018-11-13 ENCOUNTER — Other Ambulatory Visit: Payer: Self-pay

## 2018-11-13 ENCOUNTER — Emergency Department (HOSPITAL_COMMUNITY): Payer: Self-pay

## 2018-11-13 DIAGNOSIS — Z7982 Long term (current) use of aspirin: Secondary | ICD-10-CM | POA: Insufficient documentation

## 2018-11-13 DIAGNOSIS — I509 Heart failure, unspecified: Secondary | ICD-10-CM | POA: Insufficient documentation

## 2018-11-13 DIAGNOSIS — E119 Type 2 diabetes mellitus without complications: Secondary | ICD-10-CM | POA: Insufficient documentation

## 2018-11-13 DIAGNOSIS — I110 Hypertensive heart disease with heart failure: Secondary | ICD-10-CM | POA: Insufficient documentation

## 2018-11-13 DIAGNOSIS — R079 Chest pain, unspecified: Secondary | ICD-10-CM

## 2018-11-13 DIAGNOSIS — M62838 Other muscle spasm: Secondary | ICD-10-CM

## 2018-11-13 DIAGNOSIS — Z79899 Other long term (current) drug therapy: Secondary | ICD-10-CM | POA: Insufficient documentation

## 2018-11-13 DIAGNOSIS — R0789 Other chest pain: Secondary | ICD-10-CM | POA: Insufficient documentation

## 2018-11-13 DIAGNOSIS — Z794 Long term (current) use of insulin: Secondary | ICD-10-CM | POA: Insufficient documentation

## 2018-11-13 LAB — BASIC METABOLIC PANEL
Anion gap: 11 (ref 5–15)
BUN: 24 mg/dL — ABNORMAL HIGH (ref 6–20)
CO2: 29 mmol/L (ref 22–32)
Calcium: 9.3 mg/dL (ref 8.9–10.3)
Chloride: 99 mmol/L (ref 98–111)
Creatinine, Ser: 1.48 mg/dL — ABNORMAL HIGH (ref 0.61–1.24)
GFR calc Af Amer: >60 mL/min (ref >60)
GFR calc non Af Amer: 53 mL/min — ABNORMAL LOW (ref >60)
Glucose, Bld: 172 mg/dL — ABNORMAL HIGH (ref 70–99)
Potassium: 4.1 mmol/L (ref 3.5–5.1)
Sodium: 139 mmol/L (ref 135–145)

## 2018-11-13 LAB — CBC
HCT: 37.8 % — ABNORMAL LOW (ref 39.0–52.0)
Hemoglobin: 12.2 g/dL — ABNORMAL LOW (ref 13.0–17.0)
MCH: 26 pg (ref 26.0–34.0)
MCHC: 32.3 g/dL (ref 30.0–36.0)
MCV: 80.4 fL (ref 80.0–100.0)
Platelets: 228 10*3/uL (ref 150–400)
RBC: 4.7 MIL/uL (ref 4.22–5.81)
RDW: 14.5 % (ref 11.5–15.5)
WBC: 10.3 10*3/uL (ref 4.0–10.5)
nRBC: 0 % (ref 0.0–0.2)

## 2018-11-13 LAB — TROPONIN I (HIGH SENSITIVITY)
Troponin I (High Sensitivity): 10 ng/L (ref <18)
Troponin I (High Sensitivity): 10 ng/L (ref <18)

## 2018-11-13 MED ORDER — METHOCARBAMOL 500 MG PO TABS: 500.0000 mg | ORAL_TABLET | Freq: Two times a day (BID) | ORAL | 0 refills | Status: DC

## 2018-11-13 MED ORDER — HYDROCODONE-ACETAMINOPHEN 5-325 MG PO TABS
2.0000 | ORAL_TABLET | Freq: Once | ORAL | Status: AC
  Administered 2018-11-13: 2 via ORAL
  Filled 2018-11-13: qty 2

## 2018-11-13 MED ORDER — METHOCARBAMOL 500 MG PO TABS
500.0000 mg | ORAL_TABLET | Freq: Once | ORAL | Status: AC
  Administered 2018-11-13: 07:00:00 500 mg via ORAL
  Filled 2018-11-13: qty 1

## 2018-11-13 MED ORDER — SODIUM CHLORIDE 0.9% FLUSH: 3.0000 mL | Freq: Once | INTRAVENOUS | Status: DC

## 2018-11-13 NOTE — Discharge Instructions (Addendum)
Your cardiac work-up today was reassuring.  I  think this pain is more likely coming from your neck, likely some muscle spasm. I would continue your home hydrocodone, can add robaxin.  Can also use heat to help relax muscles. Follow-up with cardiology and primary care doctor as scheduled. Return here for any new/acute changes.

## 2018-11-13 NOTE — ED Triage Notes (Signed)
Patient with chest pain that started last night, states that he is a CHF patient also.  Patient denies any shortness of breath at this time.
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
## 2018-11-15 ENCOUNTER — Other Ambulatory Visit: Payer: Self-pay

## 2018-11-15 ENCOUNTER — Telehealth: Payer: Self-pay | Admitting: Family Medicine

## 2018-11-15 ENCOUNTER — Other Ambulatory Visit: Payer: Self-pay | Admitting: Family Medicine

## 2018-11-15 ENCOUNTER — Encounter (HOSPITAL_BASED_OUTPATIENT_CLINIC_OR_DEPARTMENT_OTHER): Payer: Self-pay | Admitting: *Deleted

## 2018-11-15 DIAGNOSIS — Z7982 Long term (current) use of aspirin: Secondary | ICD-10-CM | POA: Insufficient documentation

## 2018-11-15 DIAGNOSIS — Z7984 Long term (current) use of oral hypoglycemic drugs: Secondary | ICD-10-CM | POA: Insufficient documentation

## 2018-11-15 DIAGNOSIS — M25552 Pain in left hip: Secondary | ICD-10-CM | POA: Insufficient documentation

## 2018-11-15 DIAGNOSIS — I5032 Chronic diastolic (congestive) heart failure: Secondary | ICD-10-CM | POA: Insufficient documentation

## 2018-11-15 DIAGNOSIS — I2510 Atherosclerotic heart disease of native coronary artery without angina pectoris: Secondary | ICD-10-CM | POA: Insufficient documentation

## 2018-11-15 DIAGNOSIS — I110 Hypertensive heart disease with heart failure: Secondary | ICD-10-CM | POA: Insufficient documentation

## 2018-11-15 DIAGNOSIS — Z79899 Other long term (current) drug therapy: Secondary | ICD-10-CM | POA: Insufficient documentation

## 2018-11-15 DIAGNOSIS — M25512 Pain in left shoulder: Secondary | ICD-10-CM | POA: Insufficient documentation

## 2018-11-15 DIAGNOSIS — E119 Type 2 diabetes mellitus without complications: Secondary | ICD-10-CM | POA: Insufficient documentation

## 2018-11-15 MED ORDER — ACETAMINOPHEN 325 MG PO TABS
650.0000 mg | ORAL_TABLET | Freq: Once | ORAL | Status: AC
  Administered 2018-11-15: 23:00:00 650 mg via ORAL
  Filled 2018-11-15: qty 2

## 2018-11-15 NOTE — Telephone Encounter (Signed)
Last OV----11/01/2018 Last RH----6//16/2020

## 2018-11-15 NOTE — ED Triage Notes (Signed)
Left hip pain and knots in his left shoulder. States he is taking muscle relaxer's for the pain with no improvement.

## 2018-11-15 NOTE — Telephone Encounter (Signed)
Pt requested a refill for predniSONE (DELTASONE) 20 MG tablet    Pharmacy: [name] at [place], [place]

## 2018-11-16 ENCOUNTER — Emergency Department (HOSPITAL_BASED_OUTPATIENT_CLINIC_OR_DEPARTMENT_OTHER)
Admission: EM | Admit: 2018-11-16 | Discharge: 2018-11-16 | Disposition: A | Discharge status: Home / Self Care | Payer: Self-pay | Attending: Emergency Medicine | Admitting: Emergency Medicine

## 2018-11-16 DIAGNOSIS — M25552 Pain in left hip: Secondary | ICD-10-CM

## 2018-11-16 MED ORDER — ONDANSETRON 8 MG PO TBDP
8.0000 mg | ORAL_TABLET | Freq: Once | ORAL | Status: AC
  Administered 2018-11-16: 8 mg via ORAL
  Filled 2018-11-16: qty 1

## 2018-11-16 MED ORDER — DEXAMETHASONE SODIUM PHOSPHATE 10 MG/ML IJ SOLN
10.0000 mg | Freq: Once | INTRAMUSCULAR | Status: AC
  Administered 2018-11-16: 10 mg via INTRAMUSCULAR
  Filled 2018-11-16: qty 1

## 2018-11-16 MED ORDER — HYDROMORPHONE HCL 1 MG/ML IJ SOLN
1.0000 mg | Freq: Once | INTRAMUSCULAR | Status: AC
  Administered 2018-11-16: 1 mg via INTRAMUSCULAR
  Filled 2018-11-16: qty 1

## 2018-11-16 MED ORDER — METHYLPREDNISOLONE 4 MG PO TBPK: ORAL_TABLET | ORAL | 0 refills | Status: DC

## 2018-11-16 NOTE — Telephone Encounter (Signed)
He just got a medrol dosepak from ED?

## 2018-11-16 NOTE — Telephone Encounter (Signed)
There was confusion as at the pharmacy he wanted PCP's prednisone refill not the ED. So currently he now has what the ED gave him and he is ok for now. Will discuss with PCP on mychart.
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
## 2018-11-16 NOTE — Telephone Encounter (Signed)
Please advise 

## 2018-11-17 ENCOUNTER — Telehealth: Payer: Self-pay | Admitting: Internal Medicine

## 2018-11-17 NOTE — Telephone Encounter (Signed)
I called pt to remind him of his appt for 11-18-18, pt did not have a voicemail.

## 2018-11-18 ENCOUNTER — Telehealth (INDEPENDENT_AMBULATORY_CARE_PROVIDER_SITE_OTHER): Payer: Self-pay | Admitting: Internal Medicine

## 2018-11-18 ENCOUNTER — Telehealth: Payer: Self-pay

## 2018-11-18 VITALS — BP 146/88 | HR 91 | Wt 322.0 lb

## 2018-11-18 DIAGNOSIS — I10 Essential (primary) hypertension: Secondary | ICD-10-CM

## 2018-11-18 DIAGNOSIS — N179 Acute kidney failure, unspecified: Secondary | ICD-10-CM

## 2018-11-18 DIAGNOSIS — I5032 Chronic diastolic (congestive) heart failure: Secondary | ICD-10-CM

## 2018-11-18 DIAGNOSIS — E119 Type 2 diabetes mellitus without complications: Secondary | ICD-10-CM
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
## 2018-11-18 NOTE — Patient Instructions (Addendum)
Medication Instructions:  Your physician recommends that you continue on your current medications as directed. Please refer to the Current Medication list given to you today.  If you need a refill on your cardiac medications before your next appointment, please call your pharmacy.   Lab work: NONE If you have labs (blood work) drawn today and your tests are completely normal, you will receive your results only by: [name] MyChart Message (if you have MyChart) OR . A paper copy in the mail If you have any lab test that is abnormal or we need to change your treatment, we will call you to review the results.  Testing/Procedures: NONE  Follow-Up: At [facility], you and your health needs are our priority.  As part of our continuing mission to provide you with exceptional heart care, we have created designated Provider Care Teams.  These Care Teams include your primary Cardiologist (physician) and Advanced Practice Providers (APPs -  Physician Assistants and Nurse Practitioners) who all work together to provide you with the care you need, when you need it. You will need a follow up appointment in 6 weeks with an APP. Please contact our office in advance to set up this appointment. You may see one of the following Advanced Practice Providers on your designated Care Team:   Rosaria Ferries, PA-C . Jory Sims, DNP, ANP

## 2018-11-18 NOTE — Telephone Encounter (Signed)
Attempted to contact pt to review AVS. Unable to leva VM. Pt already aware that AVS to be released to mychart for review

## 2018-11-18 NOTE — Telephone Encounter (Signed)

## 2018-11-25 ENCOUNTER — Telehealth: Payer: Self-pay | Admitting: Family Medicine

## 2018-11-25 NOTE — Telephone Encounter (Signed)
Please advise 

## 2018-11-25 NOTE — Telephone Encounter (Signed)
Copied from [place] [id number]. Topic: Quick Communication - Rx Refill/Question >> Nov 25, 2018 10:32 AM Nils Flack wrote: Medication: HYDROcodone-acetaminophen (NORCO) 7.5-325 MG tablet  Has the patient contacted their pharmacy? Yes.   (Agent: If no, request that the patient contact the pharmacy for the refill.) (Agent: If yes, when and what did the pharmacy advise?)  Preferred Pharmacy (with phone number or street name): [address]  Agent: Please be advised that RX refills may take up to 3 business days. We ask that you follow-up with your pharmacy.

## 2018-11-26 MED ORDER — HYDROCODONE-ACETAMINOPHEN 7.5-325 MG PO TABS: 1.0000 | ORAL_TABLET | Freq: Four times a day (QID) | ORAL | 0 refills | Status: DC | PRN

## 2018-12-03 ENCOUNTER — Other Ambulatory Visit: Payer: Self-pay | Admitting: Family Medicine

## 2018-12-03 ENCOUNTER — Telehealth: Payer: Self-pay | Admitting: Family Medicine

## 2018-12-03 MED ORDER — LISINOPRIL 20 MG PO TABS: 20.0000 mg | ORAL_TABLET | Freq: Every day | ORAL | 2 refills | Status: DC

## 2018-12-03 MED ORDER — PREDNISONE 50 MG PO TABS: ORAL_TABLET | ORAL | 0 refills | Status: DC

## 2018-12-03 MED ORDER — NOVOLIN 70/30 FLEXPEN RELION (70-30) 100 UNIT/ML ~~LOC~~ SUPN: 20.0000 [IU] | PEN_INJECTOR | Freq: Two times a day (BID) | SUBCUTANEOUS | 0 refills | Status: DC

## 2018-12-03 NOTE — Telephone Encounter (Signed)
Medication Refill - Medication: amLODipine (NORVASC) 10 MG tablet   allopurinol (ZYLOPRIM) 100 MG tablet  Insulin Isophane & Regular Human (NOVOLIN 70/30 FLEXPEN RELION) (70-30) 100 UNIT/ML PEN  potassium chloride (K-DUR) 10 MEQ tablet  predniSONE (DELTASONE) 50 MG tablet  Pt states that This med was prescribed by ER doctor and was to be taken over by Dr. Nani Ravens / please advise   Has the patient contacted their pharmacy? Yes.   (Agent: If no, request that the patient contact the pharmacy for the refill.) (Agent: If yes, when and what did the pharmacy advise?) request sent to office   Preferred Pharmacy (with phone number or street name):  [place], [place] [phone] (Phone) [phone] (Fax)     Agent: Please be advised that RX refills may take up to 3 business days. We ask that you follow-up with your pharmacy.

## 2018-12-03 NOTE — Telephone Encounter (Signed)
Will call in to replace the Medrol Dosepak. Schedule ED f/u with [place] this week. Ty.

## 2018-12-03 NOTE — Telephone Encounter (Signed)
Pt stated this medication wasn't at the pharmacy  predniSONE (DELTASONE) 50 MG tablet   Send to [facility]  Please advise

## 2018-12-03 NOTE — Telephone Encounter (Signed)
Advise on this refill 

## 2018-12-06 ENCOUNTER — Encounter: Payer: Self-pay | Admitting: Family Medicine

## 2018-12-06 ENCOUNTER — Ambulatory Visit (HOSPITAL_BASED_OUTPATIENT_CLINIC_OR_DEPARTMENT_OTHER)
Admission: RE | Admit: 2018-12-06 | Discharge: 2018-12-06 | Disposition: A | Discharge status: Home / Self Care | Payer: Self-pay | Source: Ambulatory Visit | Attending: Family Medicine | Admitting: Family Medicine

## 2018-12-06 ENCOUNTER — Other Ambulatory Visit: Payer: Self-pay

## 2018-12-06 ENCOUNTER — Ambulatory Visit (INDEPENDENT_AMBULATORY_CARE_PROVIDER_SITE_OTHER): Payer: Self-pay | Admitting: Family Medicine

## 2018-12-06 ENCOUNTER — Other Ambulatory Visit: Payer: Self-pay | Admitting: Family Medicine

## 2018-12-06 VITALS — BP 132/86 | HR 106 | Temp 98.3°F | Ht 69.5 in | Wt 328.5 lb

## 2018-12-06 DIAGNOSIS — M25552 Pain in left hip: Secondary | ICD-10-CM | POA: Insufficient documentation

## 2018-12-06 DIAGNOSIS — M25512 Pain in left shoulder: Secondary | ICD-10-CM

## 2018-12-06 DIAGNOSIS — G8929 Other chronic pain: Secondary | ICD-10-CM

## 2018-12-06 MED ORDER — ATORVASTATIN CALCIUM 80 MG PO TABS: 80.0000 mg | ORAL_TABLET | Freq: Every day | ORAL | 3 refills | Status: DC

## 2018-12-06 MED ORDER — ATORVASTATIN CALCIUM 80 MG PO TABS: 80.0000 mg | ORAL_TABLET | Freq: Every day | ORAL | 0 refills | Status: DC

## 2018-12-06 MED ORDER — MELOXICAM 15 MG PO TABS: 15.0000 mg | ORAL_TABLET | Freq: Every day | ORAL | 0 refills | Status: DC

## 2018-12-06 MED ORDER — CYCLOBENZAPRINE HCL 10 MG PO TABS: 10.0000 mg | ORAL_TABLET | Freq: Three times a day (TID) | ORAL | 0 refills | Status: DC | PRN

## 2018-12-06 MED ORDER — METFORMIN HCL 1000 MG PO TABS: 1000.0000 mg | ORAL_TABLET | Freq: Two times a day (BID) | ORAL | 2 refills | Status: DC

## 2018-12-06 MED ORDER — LISINOPRIL 20 MG PO TABS: 20.0000 mg | ORAL_TABLET | Freq: Every day | ORAL | 2 refills | Status: DC

## 2018-12-06 MED ORDER — TRAZODONE HCL 50 MG PO TABS: 25.0000 mg | ORAL_TABLET | Freq: Every evening | ORAL | 2 refills | Status: DC | PRN

## 2018-12-06 MED ORDER — AMLODIPINE BESYLATE 10 MG PO TABS: 10.0000 mg | ORAL_TABLET | Freq: Every day | ORAL | 2 refills | Status: DC

## 2018-12-06 MED ORDER — CARVEDILOL 6.25 MG PO TABS: 6.2500 mg | ORAL_TABLET | Freq: Two times a day (BID) | ORAL | 2 refills | Status: DC

## 2018-12-06 MED ORDER — TRAMADOL HCL 50 MG PO TABS: 50.0000 mg | ORAL_TABLET | Freq: Three times a day (TID) | ORAL | 0 refills | Status: DC | PRN

## 2018-12-06 MED ORDER — FUROSEMIDE 40 MG PO TABS: 40.0000 mg | ORAL_TABLET | Freq: Two times a day (BID) | ORAL | 2 refills | Status: DC

## 2018-12-06 MED ORDER — POTASSIUM CHLORIDE CRYS ER 10 MEQ PO TBCR: 20.0000 meq | EXTENDED_RELEASE_TABLET | Freq: Every day | ORAL | 3 refills | Status: DC

## 2018-12-06 MED ORDER — ROPINIROLE HCL 4 MG PO TABS: ORAL_TABLET | ORAL | 2 refills | Status: DC
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
## 2018-12-06 NOTE — Telephone Encounter (Signed)
Called and scheduled appt for today.  °

## 2018-12-06 NOTE — Telephone Encounter (Signed)
Please advise if Dr. Nani Ravens would like to refill as it show historical provider

## 2018-12-06 NOTE — Patient Instructions (Addendum)
I think you have some arthritis in your hip. If you do not hear anything about your referral in the next 1-2 weeks, call our office and ask for an update.  Take Flexeril (cyclobenzaprine) 1-2 hours before planned bedtime. If it makes you drowsy, do not take during the day. You can try half a tab the following night.  Do not drink alcohol, do any illicit/street drugs, drive or do anything that requires alertness while on this medicine.   Let us know if you need anything.  Pectoralis Major Rehab Ask your health care provider which exercises are safe for you. Do exercises exactly as told by your health care provider and adjust them as directed. It is normal to feel mild stretching, pulling, tightness, or discomfort as you do these exercises, but you should stop right away if you feel sudden pain or your pain gets worse.Do not begin these exercises until told by your health care provider. Stretching and range of motion exercises These exercises warm up your muscles and joints and improve the movement and flexibility of your shoulder. These exercises can also help to relieve pain, numbness, and tingling. Exercise A: Pendulum  1. Stand near a wall or a surface that you can hold onto for balance. 2. Bend at the waist and let your left / right arm hang straight down. Use your other arm to keep your balance. 3. Relax your arm and shoulder muscles, and move your hips and your trunk so your left / right arm swings freely. Your arm should swing because of the motion of your body, not because you are using your arm or shoulder muscles. 4. Keep moving so your arm swings in the following directions, as told by your health care provider: ? Side to side. ? Forward and backward. ? In clockwise and counterclockwise circles. 5. Slowly return to the starting position. Repeat 2 times. Complete this exercise 3 times per week. Exercise B: Abduction, standing 1. Stand and hold a broomstick, a cane, or a similar  object. Place your hands a little more than shoulder-width apart on the object. Your left / right hand should be palm-up, and your other hand should be palm-down. 2. While keeping your elbow straight and your shoulder muscles relaxed, push the stick across your body toward your left / right side. Raise your left / right arm to the side of your body and then over your head until you feel a stretch in your shoulder. ? Stop when you reach the angle that is recommended by your health care provider. ? Avoid shrugging your shoulder while you raise your arm. Keep your shoulder blade tucked down toward the middle of your spine. 3. Hold for 10 seconds. 4. Slowly return to the starting position. Repeat 2 times. Complete this exercise 3 times per week. Exercise C: Wand flexion, supine  1. Lie on your back. You may bend your knees for comfort. 2. Hold a broomstick, a cane, or a similar object so that your hands are about shoulder-width apart on the object. Your palms should face toward your feet. 3. Raise your left / right arm in front of your face, then behind your head (toward the floor). Use your other hand to help you do this. Stop when you feel a gentle stretch in your shoulder, or when you reach the angle that is recommended by your health care provider. 4. Hold for 3 seconds. 5. Use the broomstick and your other arm to help you return your left / right  arm to the starting position. Repeat 2 times. Complete this exercise 3 times per week. Exercise D: Wand shoulder external rotation 1. Stand and hold a broomstick, a cane, or a similar object so your handsare about shoulder-width apart on the object. 2. Start with your arms hanging down, then bend both elbows to an "L" shape (90 degrees). 3. Keep your left / right elbow at your side. Use your other hand to push the stick so your left / right forearm moves away from your body, out to your side. ? Keep your left / right elbow bent to 90 degrees and keep it  against your side. ? Stop when you feel a gentle stretch in your shoulder, or when you reach the angle recommended by your health care provider. 4. Hold for 10 seconds. 5. Use the stick to help you return your left / right arm to the starting position. Repeat 2 times. Complete this exercise 3 times per week. Strengthening exercises These exercises build strength and endurance in your shoulder. Endurance is the ability to use your muscles for a long time, even after your muscles get tired. Exercise E: Scapular protraction, standing 1. Stand so you are facing a wall. Place your feet about one arm-length away from the wall. 2. Place your hands on the wall and straighten your elbows. 3. Keep your hands on the wall as you push your upper back away from the wall. You should feel your shoulder blades sliding forward.Keep your elbows and your head still. ? If you are not sure that you are doing this exercise correctly, ask your health care provider for more instructions. 4. Hold for 3 seconds. 5. Slowly return to the starting position. Let your muscles relax completely before you repeat this exercise. Repeat 2 times. Complete this exercise 3 times per week. Exercise F: Shoulder blade squeezes  (scapular retraction) 1. Sit with good posture in a stable chair. Do not let your back touch the back of the chair. 2. Your arms should be at your sides with your elbows bent. You may rest your forearms on a pillow if that is more comfortable. 3. Squeeze your shoulder blades together. Bring them down and back. ? Keep your shoulders level. ? Do not lift your shoulders up toward your ears. 4. Hold for 3 seconds. 5. Return to the starting position. Repeat 2 times. Complete this exercise 3 times per week. This information is not intended to replace advice given to you by your health care provider. Make sure you discuss any questions you have with your health care provider. Document Released: 04/07/2005 Document  Revised: 01/17/2016 Document Reviewed: 12/24/2014 Elsevier Interactive Patient Education  2018 [place] (ROM) AND STRETCHING EXERCISES These exercises may help you when beginning to rehabilitate your injury. While completing these exercises, remember:   Restoring tissue flexibility helps normal motion to return to the joints. This allows healthier, less painful movement and activity.  An effective stretch should be held for at least 30 seconds.  A stretch should never be painful. You should only feel a gentle lengthening or release in the stretched tissue.  ROM - Pendulum  Bend at the waist so that your right / left arm falls away from your body. Support yourself with your opposite hand on a solid surface, such as a table or a countertop.  Your right / left arm should be perpendicular to the ground. If it is not perpendicular, you need to lean over  farther. Relax the muscles in your right / left arm and shoulder as much as possible.  Gently sway your hips and trunk so they move your right / left arm without any use of your right / left shoulder muscles.  Progress your movements so that your right / left arm moves side to side, then forward and backward, and finally, both clockwise and counterclockwise.  Complete 10-15 repetitions in each direction. Many people use this exercise to relieve discomfort in their shoulder as well as to gain range of motion. Repeat 2 times. Complete this exercise 3 times per week.  STRETCH - Flexion, Standing  Stand with good posture. With an underhand grip on your right / left hand and an overhand grip on the opposite hand, grasp a broomstick or cane so that your hands are a little more than shoulder-width apart.  Keeping your right / left elbow straight and shoulder muscles relaxed, push the stick with your opposite hand to raise your right / left arm in front of your body and then overhead. Raise your arm until you  feel a stretch in your right / left shoulder, but before you have increased shoulder pain.  Try to avoid shrugging your right / left shoulder as your arm rises by keeping your shoulder blade tucked down and toward your mid-back spine. Hold 30 seconds.  Slowly return to the starting position. Repeat 2 times. Complete this exercise 3 times per week.  STRETCH - Internal Rotation  Place your right / left hand behind your back, palm-up.  Throw a towel or belt over your opposite shoulder. Grasp the towel/belt with your right / left hand.  While keeping an upright posture, gently pull up on the towel/belt until you feel a stretch in the front of your right / left shoulder.  Avoid shrugging your right / left shoulder as your arm rises by keeping your shoulder blade tucked down and toward your mid-back spine.  Hold 30. Release the stretch by lowering your opposite hand. Repeat 2 times. Complete this exercise 3 times per week.  STRETCH - External Rotation and Abduction  Stagger your stance through a doorframe. It does not matter which foot is forward.  As instructed by your physician, physical therapist or athletic trainer, place your hands: ? And forearms above your head and on the door frame. ? And forearms at head-height and on the door frame. ? At elbow-height and on the door frame.  Keeping your head and chest upright and your stomach muscles tight to prevent over-extending your low-back, slowly shift your weight onto your front foot until you feel a stretch across your chest and/or in the front of your shoulders.  Hold 30 seconds. Shift your weight to your back foot to release the stretch. Repeat 2 times. Complete this stretch 3 times per week.   STRENGTHENING EXERCISES  These exercises may help you when beginning to rehabilitate your injury. They may resolve your symptoms with or without further involvement from your physician, physical therapist or athletic trainer. While completing  these exercises, remember:   Muscles can gain both the endurance and the strength needed for everyday activities through controlled exercises.  Complete these exercises as instructed by your physician, physical therapist or athletic trainer. Progress the resistance and repetitions only as guided.  You may experience muscle soreness or fatigue, but the pain or discomfort you are trying to eliminate should never worsen during these exercises. If this pain does worsen, stop and make certain you are following  the directions exactly. If the pain is still present after adjustments, discontinue the exercise until you can discuss the trouble with your clinician.  If advised by your physician, during your recovery, avoid activity or exercises which involve actions that place your right / left hand or elbow above your head or behind your back or head. These positions stress the tissues which are trying to heal.  STRENGTH - Scapular Depression and Adduction  With good posture, sit on a firm chair. Supported your arms in front of you with pillows, arm rests or a table top. Have your elbows in line with the sides of your body.  Gently draw your shoulder blades down and toward your mid-back spine. Gradually increase the tension without tensing the muscles along the top of your shoulders and the back of your neck.  Hold for 3 seconds. Slowly release the tension and relax your muscles completely before completing the next repetition.  After you have practiced this exercise, remove the arm support and complete it in standing as well as sitting. Repeat 2 times. Complete this exercise 3 times per week.   STRENGTH - External Rotators  Secure a rubber exercise band/tubing to a fixed object so that it is at the same height as your right / left elbow when you are standing or sitting on a firm surface.  Stand or sit so that the secured exercise band/tubing is at your side that is not injured.  Bend your elbow 90  degrees. Place a folded towel or small pillow under your right / left arm so that your elbow is a few inches away from your side.  Keeping the tension on the exercise band/tubing, pull it away from your body, as if pivoting on your elbow. Be sure to keep your body steady so that the movement is only coming from your shoulder rotating.  Hold 3 seconds. Release the tension in a controlled manner as you return to the starting position. Repeat 2 times. Complete this exercise 3 times per week.   STRENGTH - Supraspinatus  Stand or sit with good posture. Grasp a 2-3 lb weight or an exercise band/tubing so that your hand is "thumbs-up," like when you shake hands.  Slowly lift your right / left hand from your thigh into the air, traveling about 30 degrees from straight out at your side. Lift your hand to shoulder height or as far as you can without increasing any shoulder pain. Initially, many people do not lift their hands above shoulder height.  Avoid shrugging your right / left shoulder as your arm rises by keeping your shoulder blade tucked down and toward your mid-back spine.  Hold for 3 seconds. Control the descent of your hand as you slowly return to your starting position. Repeat 2 times. Complete this exercise 3 times per week.   STRENGTH - Shoulder Extensors  Secure a rubber exercise band/tubing so that it is at the height of your shoulders when you are either standing or sitting on a firm arm-less chair.  With a thumbs-up grip, grasp an end of the band/tubing in each hand. Straighten your elbows and lift your hands straight in front of you at shoulder height. Step back away from the secured end of band/tubing until it becomes tense.  Squeezing your shoulder blades together, pull your hands down to the sides of your thighs. Do not allow your hands to go behind you.  Hold for 3 seconds. Slowly ease the tension on the band/tubing as you reverse the  directions and return to the starting  position. Repeat 2 times. Complete this exercise 3 times per week.   STRENGTH - Scapular Retractors  Secure a rubber exercise band/tubing so that it is at the height of your shoulders when you are either standing or sitting on a firm arm-less chair.  With a palm-down grip, grasp an end of the band/tubing in each hand. Straighten your elbows and lift your hands straight in front of you at shoulder height. Step back away from the secured end of band/tubing until it becomes tense.  Squeezing your shoulder blades together, draw your elbows back as you bend them. Keep your upper arm lifted away from your body throughout the exercise.  Hold 3 seconds. Slowly ease the tension on the band/tubing as you reverse the directions and return to the starting position. Repeat 2 times. Complete this exercise 3 times per week.  STRENGTH - Scapular Depressors  Find a sturdy chair without wheels, such as a from a dining room table.  Keeping your feet on the floor, lift your bottom from the seat and lock your elbows.  Keeping your elbows straight, allow gravity to pull your body weight down. Your shoulders will rise toward your ears.  Raise your body against gravity by drawing your shoulder blades down your back, shortening the distance between your shoulders and ears. Although your feet should always maintain contact with the floor, your feet should progressively support less body weight as you get stronger.  Hold 3 seconds. In a controlled and slow manner, lower your body weight to begin the next repetition. Repeat 2 times. Complete this exercise 3 times per week.   This information is not intended to replace advice given to you by your health care provider. Make sure you discuss any questions you have with your health care provider.  Document Released: 02/19/2005 Document Revised: 04/28/2014 Document Reviewed: 07/20/2008 Elsevier Interactive Patient Education [employer].

## 2018-12-06 NOTE — Telephone Encounter (Signed)
Patient needs a refill on his atorvastatin (LIPITOR) 80 MG tablet medication and have it sent to his preferred pharmacy [facility] in [facility] on [address].

## 2018-12-06 NOTE — Telephone Encounter (Signed)
Called left message to call back 

## 2018-12-11 ENCOUNTER — Other Ambulatory Visit: Payer: Self-pay | Admitting: Family Medicine

## 2018-12-13 ENCOUNTER — Ambulatory Visit: Payer: Self-pay | Admitting: Family Medicine

## 2018-12-16 ENCOUNTER — Ambulatory Visit (INDEPENDENT_AMBULATORY_CARE_PROVIDER_SITE_OTHER): Payer: Self-pay | Admitting: Orthopaedic Surgery

## 2018-12-16 ENCOUNTER — Encounter: Payer: Self-pay | Admitting: Orthopaedic Surgery

## 2018-12-16 ENCOUNTER — Other Ambulatory Visit: Payer: Self-pay | Admitting: *Deleted

## 2018-12-16 ENCOUNTER — Ambulatory Visit: Payer: Self-pay

## 2018-12-16 VITALS — Ht 69.0 in | Wt 320.0 lb

## 2018-12-16 DIAGNOSIS — M1612 Unilateral primary osteoarthritis, left hip: Secondary | ICD-10-CM

## 2018-12-16 DIAGNOSIS — Z6841 Body Mass Index (BMI) 40.0 and over, adult: Secondary | ICD-10-CM

## 2018-12-16 DIAGNOSIS — M5412 Radiculopathy, cervical region: Secondary | ICD-10-CM

## 2018-12-16 MED ORDER — AMLODIPINE BESYLATE 10 MG PO TABS: 10.0000 mg | ORAL_TABLET | Freq: Every day | ORAL | 3 refills | Status: DC
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
## 2018-12-16 NOTE — Progress Notes (Signed)
Subjective: Patient is here for ultrasound-guided intra-articular left hip injection.   End-stage DJD.  Objective:  Pain and limited ROM.  Procedure: Ultrasound-guided left hip injection: After sterile prep with Betadine, injected 8 cc 1% lidocaine without epinephrine and 40 mg methylprednisolone using a 22-gauge spinal needle, passing the needle through the iliofemoral ligament into the femoral head/neck junction.  Injectate seen filling joint capsule.  Good immediate relief.

## 2018-12-22 ENCOUNTER — Other Ambulatory Visit: Payer: Self-pay | Admitting: Family Medicine

## 2018-12-22 MED ORDER — CYCLOBENZAPRINE HCL 10 MG PO TABS: 10.0000 mg | ORAL_TABLET | Freq: Three times a day (TID) | ORAL | 0 refills | Status: DC | PRN

## 2018-12-22 MED ORDER — HYDROCODONE-ACETAMINOPHEN 7.5-325 MG PO TABS: 1.0000 | ORAL_TABLET | Freq: Four times a day (QID) | ORAL | 0 refills | Status: DC | PRN

## 2018-12-23 ENCOUNTER — Telehealth: Payer: Self-pay | Admitting: *Deleted

## 2018-12-23 ENCOUNTER — Telehealth: Payer: Self-pay

## 2018-12-23 MED ORDER — HYDROCODONE-ACETAMINOPHEN 7.5-325 MG PO TABS: 1.0000 | ORAL_TABLET | Freq: Four times a day (QID) | ORAL | 0 refills | Status: DC | PRN

## 2018-12-23 NOTE — Telephone Encounter (Signed)
Can you resend hydrocodone to [facility]   Type Call Who Is [place] Call Type Pharmacy Send to RN Chief Complaint Prescription Refill or Medication Request (non symptomatic) Reason for Call Request to change medication order Initial Comment Additional Comment Dorothea Ogle states they do not have Hydrocodone in stock. He is asking to [facility] a [facility] in [place]. Pharmacy Name [facility] Pharmacist Name [facility] Number [phone] Translation No Nurse Assessment Nurse: Loletha Carrow, RN, Ronalee Belts Date/Time Eilene Ghazi Time): 12/22/2018 9:33:12 PM Please select the assessment type ---Pharmacy clarification Additional Documentation ---Tylor @ [facility] [phone] We do not have Hydrocodone in [place] in [place] has them in stock please re-send Rx to that location Is there an on-call physician for the client? ---No Additional Documentation ---Tylor @ [facility] [employer] [phone] We do not have Hydrocodone in [place] in [place] has them in stock please re-send Rx to that location

## 2018-12-23 NOTE — Addendum Note (Signed)
Addended by: Ames Coupe on: 12/23/2018 04:47 PM   Modules accepted: Orders

## 2018-12-23 NOTE — Telephone Encounter (Signed)
Can't find it, can you add it for me please?

## 2018-12-23 NOTE — Telephone Encounter (Signed)
That's it, ty.

## 2018-12-23 NOTE — Telephone Encounter (Signed)
Copied from [place] [id number]. Topic: General - Other >> Dec 23, 2018  9:14 AM Leward Quan A wrote: Reason for CRM: [facility] called to inform Dr Nani Ravens that they do not have the HYDROcodone-acetaminophen ([place]) 7.5-325 MG tablet so patient would like the Rx sent to the [facility] on [address]

## 2018-12-23 NOTE — Telephone Encounter (Signed)
[facility] are [facility] right?  If so I added it for you.  Its [address]

## 2018-12-24 NOTE — Telephone Encounter (Signed)
No answer no vm

## 2018-12-24 NOTE — Telephone Encounter (Signed)
Justin Hall advised patient that hydrocodone was sent into the [facility] on [facility].

## 2019-01-07 ENCOUNTER — Other Ambulatory Visit: Payer: Self-pay | Admitting: Family Medicine

## 2019-01-08 MED ORDER — CLONAZEPAM 1 MG PO TABS: 0.5000 mg | ORAL_TABLET | Freq: Every day | ORAL | 0 refills | Status: DC | PRN

## 2019-01-08 MED ORDER — CYCLOBENZAPRINE HCL 10 MG PO TABS: 10.0000 mg | ORAL_TABLET | Freq: Three times a day (TID) | ORAL | 0 refills | Status: DC | PRN

## 2019-01-08 MED ORDER — TRAMADOL HCL 50 MG PO TABS: 50.0000 mg | ORAL_TABLET | Freq: Three times a day (TID) | ORAL | 0 refills | Status: DC | PRN
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
## 2019-01-25 ENCOUNTER — Other Ambulatory Visit: Payer: Self-pay

## 2019-01-25 ENCOUNTER — Ambulatory Visit (INDEPENDENT_AMBULATORY_CARE_PROVIDER_SITE_OTHER): Payer: Self-pay | Admitting: Adult Health

## 2019-01-25 ENCOUNTER — Encounter: Payer: Self-pay | Admitting: Adult Health

## 2019-01-25 VITALS — BP 122/82 | HR 89 | Ht 69.0 in | Wt 322.6 lb

## 2019-01-25 DIAGNOSIS — M1612 Unilateral primary osteoarthritis, left hip: Secondary | ICD-10-CM

## 2019-01-25 DIAGNOSIS — Z794 Long term (current) use of insulin: Secondary | ICD-10-CM

## 2019-01-25 DIAGNOSIS — I5032 Chronic diastolic (congestive) heart failure: Secondary | ICD-10-CM

## 2019-01-25 DIAGNOSIS — I10 Essential (primary) hypertension: Secondary | ICD-10-CM

## 2019-01-25 DIAGNOSIS — E118 Type 2 diabetes mellitus with unspecified complications: Secondary | ICD-10-CM

## 2019-01-25 NOTE — Patient Instructions (Signed)
Medication Instructions:  Continue current medications  If you need a refill on your cardiac medications before your next appointment, please call your pharmacy.  Labwork: None Ordered   Testing/Procedures: None Ordered  Follow-Up: You will need a follow up appointment in 3 months.  Please call our office 2 months in advance to schedule this appointment.  You may see Gayatri A Acharya, MD or one of the following Advanced Practice Providers on your designated Care Team:   Rhonda Barrett, PA-C . Kathryn Lawrence, DNP, ANP     At [facility], you and your health needs are our priority.  As part of our continuing mission to provide you with exceptional heart care, we have created designated Provider Care Teams.  These Care Teams include your primary Cardiologist (physician) and Advanced Practice Providers (APPs -  Physician Assistants and Nurse Practitioners) who all work together to provide you with the care you need, when you need it.  Thank you for choosing [facility] at [facility]!!     

## 2019-01-27 ENCOUNTER — Telehealth: Payer: Self-pay

## 2019-01-27 NOTE — Telephone Encounter (Signed)
Copied from [place] [id number]. Topic: General - Other >> Jan 27, 2019  3:34 PM Leward Quan A wrote: Reason for CRM: Patient called he had several missed calls can be reached at Ph# [phone]

## 2019-01-28 ENCOUNTER — Ambulatory Visit (INDEPENDENT_AMBULATORY_CARE_PROVIDER_SITE_OTHER): Payer: Self-pay | Admitting: Family Medicine

## 2019-01-28 ENCOUNTER — Encounter: Payer: Self-pay | Admitting: Family Medicine

## 2019-01-28 ENCOUNTER — Other Ambulatory Visit: Payer: Self-pay

## 2019-01-28 VITALS — BP 114/72 | HR 96 | Temp 97.3°F | Ht 69.0 in | Wt 315.4 lb

## 2019-01-28 DIAGNOSIS — Z23 Encounter for immunization: Secondary | ICD-10-CM

## 2019-01-28 DIAGNOSIS — E1165 Type 2 diabetes mellitus with hyperglycemia: Secondary | ICD-10-CM

## 2019-01-28 DIAGNOSIS — M1612 Unilateral primary osteoarthritis, left hip: Secondary | ICD-10-CM | POA: Insufficient documentation

## 2019-01-28 LAB — BASIC METABOLIC PANEL
BUN: 33 mg/dL — ABNORMAL HIGH (ref 6–23)
CO2: 31 mEq/L (ref 19–32)
Calcium: 9.5 mg/dL (ref 8.4–10.5)
Chloride: 98 mEq/L (ref 96–112)
Creatinine, Ser: 1.68 mg/dL — ABNORMAL HIGH (ref 0.40–1.50)
GFR: 42.54 mL/min — ABNORMAL LOW (ref >60.00)
Glucose, Bld: 91 mg/dL (ref 70–99)
Potassium: 4.3 mEq/L (ref 3.5–5.1)
Sodium: 139 mEq/L (ref 135–145)

## 2019-01-28 LAB — HEMOGLOBIN A1C: Hgb A1c MFr Bld: 7.6 % — ABNORMAL HIGH (ref 4.6–6.5)

## 2019-01-28 MED ORDER — AMLODIPINE BESYLATE 5 MG PO TABS: 5.0000 mg | ORAL_TABLET | Freq: Every day | ORAL | 3 refills | Status: DC

## 2019-01-28 NOTE — Patient Instructions (Signed)
Give [place] 2-3 business days to get the results of your labs back.   Keep the diet clean and stay active.  I will reach out to your heart team regarding a weight loss medication to see if they are OK with it.   Let us know if you need anything.
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
## 2019-01-28 NOTE — Telephone Encounter (Signed)
Patient is scheduled today with PCP.

## 2019-01-29 ENCOUNTER — Other Ambulatory Visit: Payer: Self-pay | Admitting: Family Medicine

## 2019-01-29 DIAGNOSIS — R7989 Other specified abnormal findings of blood chemistry: Secondary | ICD-10-CM

## 2019-01-31 ENCOUNTER — Encounter: Payer: Self-pay | Admitting: Family Medicine

## 2019-02-01 ENCOUNTER — Other Ambulatory Visit (INDEPENDENT_AMBULATORY_CARE_PROVIDER_SITE_OTHER): Payer: Self-pay

## 2019-02-01 ENCOUNTER — Other Ambulatory Visit: Payer: Self-pay

## 2019-02-01 ENCOUNTER — Encounter: Payer: Self-pay | Admitting: Family Medicine

## 2019-02-01 ENCOUNTER — Other Ambulatory Visit: Payer: Self-pay | Admitting: Family Medicine

## 2019-02-01 DIAGNOSIS — R7989 Other specified abnormal findings of blood chemistry: Secondary | ICD-10-CM

## 2019-02-01 LAB — BASIC METABOLIC PANEL
BUN: 25 mg/dL — ABNORMAL HIGH (ref 6–23)
CO2: 32 mEq/L (ref 19–32)
Calcium: 9.4 mg/dL (ref 8.4–10.5)
Chloride: 97 mEq/L (ref 96–112)
Creatinine, Ser: 1.44 mg/dL (ref 0.40–1.50)
GFR: 50.82 mL/min — ABNORMAL LOW (ref >60.00)
Glucose, Bld: 110 mg/dL — ABNORMAL HIGH (ref 70–99)
Potassium: 4.3 mEq/L (ref 3.5–5.1)
Sodium: 138 mEq/L (ref 135–145)

## 2019-02-01 MED ORDER — OZEMPIC (0.25 OR 0.5 MG/DOSE) 2 MG/1.5ML ~~LOC~~ SOPN: 0.5000 mg | PEN_INJECTOR | SUBCUTANEOUS | 3 refills | Status: DC

## 2019-02-02 ENCOUNTER — Other Ambulatory Visit: Payer: Self-pay | Admitting: Family Medicine

## 2019-02-03 ENCOUNTER — Encounter: Payer: Self-pay | Admitting: Family Medicine

## 2019-02-03 MED ORDER — CYCLOBENZAPRINE HCL 10 MG PO TABS: 10.0000 mg | ORAL_TABLET | Freq: Three times a day (TID) | ORAL | 0 refills | Status: DC | PRN

## 2019-02-03 MED ORDER — NOVOLIN 70/30 FLEXPEN RELION (70-30) 100 UNIT/ML ~~LOC~~ SUPN: 20.0000 [IU] | PEN_INJECTOR | Freq: Two times a day (BID) | SUBCUTANEOUS | 0 refills | Status: DC

## 2019-02-03 NOTE — Telephone Encounter (Signed)
Refill Request: Norco  Last RX:12/23/18 Last OV:01/28/19 Next UU:[id number] scheduled  UDS:08/12/17 CSC: CSR:

## 2019-02-04 MED ORDER — HYDROCODONE-ACETAMINOPHEN 7.5-325 MG PO TABS: 1.0000 | ORAL_TABLET | Freq: Four times a day (QID) | ORAL | 0 refills | Status: DC | PRN

## 2019-02-16 ENCOUNTER — Encounter (HOSPITAL_BASED_OUTPATIENT_CLINIC_OR_DEPARTMENT_OTHER): Payer: Self-pay

## 2019-02-16 ENCOUNTER — Emergency Department (HOSPITAL_BASED_OUTPATIENT_CLINIC_OR_DEPARTMENT_OTHER): Payer: Self-pay

## 2019-02-16 ENCOUNTER — Emergency Department (HOSPITAL_BASED_OUTPATIENT_CLINIC_OR_DEPARTMENT_OTHER)
Admission: EM | Admit: 2019-02-16 | Discharge: 2019-02-17 | Disposition: A | Discharge status: Home / Self Care | Payer: Self-pay | Attending: Emergency Medicine | Admitting: Emergency Medicine

## 2019-02-16 ENCOUNTER — Other Ambulatory Visit: Payer: Self-pay

## 2019-02-16 DIAGNOSIS — Z79899 Other long term (current) drug therapy: Secondary | ICD-10-CM | POA: Insufficient documentation

## 2019-02-16 DIAGNOSIS — Z20828 Contact with and (suspected) exposure to other viral communicable diseases: Secondary | ICD-10-CM | POA: Insufficient documentation

## 2019-02-16 DIAGNOSIS — E119 Type 2 diabetes mellitus without complications: Secondary | ICD-10-CM | POA: Insufficient documentation

## 2019-02-16 DIAGNOSIS — I10 Essential (primary) hypertension: Secondary | ICD-10-CM | POA: Insufficient documentation

## 2019-02-16 DIAGNOSIS — Z7982 Long term (current) use of aspirin: Secondary | ICD-10-CM | POA: Insufficient documentation

## 2019-02-16 DIAGNOSIS — R200 Anesthesia of skin: Secondary | ICD-10-CM | POA: Insufficient documentation

## 2019-02-16 DIAGNOSIS — Z794 Long term (current) use of insulin: Secondary | ICD-10-CM | POA: Insufficient documentation

## 2019-02-16 DIAGNOSIS — R197 Diarrhea, unspecified: Secondary | ICD-10-CM | POA: Insufficient documentation

## 2019-02-16 DIAGNOSIS — R0602 Shortness of breath: Secondary | ICD-10-CM

## 2019-02-16 DIAGNOSIS — R2243 Localized swelling, mass and lump, lower limb, bilateral: Secondary | ICD-10-CM | POA: Insufficient documentation

## 2019-02-16 DIAGNOSIS — R2233 Localized swelling, mass and lump, upper limb, bilateral: Secondary | ICD-10-CM | POA: Insufficient documentation

## 2019-02-16 DIAGNOSIS — M5412 Radiculopathy, cervical region: Secondary | ICD-10-CM

## 2019-02-16 LAB — BASIC METABOLIC PANEL
Anion gap: 14 (ref 5–15)
BUN: 34 mg/dL — ABNORMAL HIGH (ref 6–20)
CO2: 25 mmol/L (ref 22–32)
Calcium: 9 mg/dL (ref 8.9–10.3)
Chloride: 99 mmol/L (ref 98–111)
Creatinine, Ser: 1.38 mg/dL — ABNORMAL HIGH (ref 0.61–1.24)
GFR calc Af Amer: >60 mL/min (ref >60)
GFR calc non Af Amer: 57 mL/min — ABNORMAL LOW (ref >60)
Glucose, Bld: 145 mg/dL — ABNORMAL HIGH (ref 70–99)
Potassium: 4 mmol/L (ref 3.5–5.1)
Sodium: 138 mmol/L (ref 135–145)

## 2019-02-16 LAB — CBC
HCT: 34.1 % — ABNORMAL LOW (ref 39.0–52.0)
Hemoglobin: 11.4 g/dL — ABNORMAL LOW (ref 13.0–17.0)
MCH: 27.1 pg (ref 26.0–34.0)
MCHC: 33.4 g/dL (ref 30.0–36.0)
MCV: 81.2 fL (ref 80.0–100.0)
Platelets: 273 10*3/uL (ref 150–400)
RBC: 4.2 MIL/uL — ABNORMAL LOW (ref 4.22–5.81)
RDW: 14.6 % (ref 11.5–15.5)
WBC: 11.6 10*3/uL — ABNORMAL HIGH (ref 4.0–10.5)
nRBC: 0 % (ref 0.0–0.2)

## 2019-02-16 MED ORDER — SODIUM CHLORIDE 0.9% FLUSH
3.0000 mL | Freq: Once | INTRAVENOUS | Status: DC
  Filled 2019-02-16: qty 3

## 2019-02-16 NOTE — ED Triage Notes (Signed)
Pt c/o SOB and increase in UE and LE swelling-to triage in w/c-NAD

## 2019-02-16 NOTE — ED Notes (Signed)
Notified Justin Hall in lab to add on bnp and ddimmer
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
## 2019-02-17 ENCOUNTER — Emergency Department (HOSPITAL_BASED_OUTPATIENT_CLINIC_OR_DEPARTMENT_OTHER): Payer: Self-pay

## 2019-02-17 LAB — URINALYSIS, ROUTINE W REFLEX MICROSCOPIC
Bilirubin Urine: NEGATIVE
Glucose, UA: NEGATIVE mg/dL
Hgb urine dipstick: NEGATIVE
Ketones, ur: NEGATIVE mg/dL
Leukocytes,Ua: NEGATIVE
Nitrite: NEGATIVE
Protein, ur: NEGATIVE mg/dL
Specific Gravity, Urine: 1.01 (ref 1.005–1.030)
pH: 7 (ref 5.0–8.0)

## 2019-02-17 LAB — D-DIMER, QUANTITATIVE: D-Dimer, Quant: 0.99 ug/mL-FEU — ABNORMAL HIGH (ref 0.00–0.50)

## 2019-02-17 LAB — SARS CORONAVIRUS 2 (TAT 6-24 HRS): SARS Coronavirus 2: NEGATIVE

## 2019-02-17 LAB — BRAIN NATRIURETIC PEPTIDE: B Natriuretic Peptide: 24.1 pg/mL (ref 0.0–100.0)

## 2019-02-17 MED ORDER — IOHEXOL 350 MG/ML SOLN
100.0000 mL | Freq: Once | INTRAVENOUS | Status: AC
  Administered 2019-02-17: 100 mL via INTRAVENOUS

## 2019-02-17 MED ORDER — ALBUTEROL SULFATE HFA 108 (90 BASE) MCG/ACT IN AERS
2.0000 | INHALATION_SPRAY | RESPIRATORY_TRACT | Status: DC | PRN
  Administered 2019-02-17: 2 via RESPIRATORY_TRACT
  Filled 2019-02-17: qty 6.7

## 2019-02-23 ENCOUNTER — Other Ambulatory Visit: Payer: Self-pay | Admitting: Family Medicine

## 2019-02-23 ENCOUNTER — Encounter: Payer: Self-pay | Admitting: Family Medicine

## 2019-02-23 ENCOUNTER — Telehealth: Payer: Self-pay | Admitting: Family Medicine

## 2019-02-23 ENCOUNTER — Ambulatory Visit (INDEPENDENT_AMBULATORY_CARE_PROVIDER_SITE_OTHER): Payer: Self-pay | Admitting: Family Medicine

## 2019-02-23 ENCOUNTER — Other Ambulatory Visit: Payer: Self-pay

## 2019-02-23 VITALS — BP 132/78 | HR 120 | Temp 97.5°F | Ht 69.0 in | Wt 311.0 lb

## 2019-02-23 DIAGNOSIS — M1612 Unilateral primary osteoarthritis, left hip: Secondary | ICD-10-CM

## 2019-02-23 DIAGNOSIS — E119 Type 2 diabetes mellitus without complications: Secondary | ICD-10-CM

## 2019-02-23 MED ORDER — OXYCODONE HCL 7.5 MG PO TABS: 1.0000 | ORAL_TABLET | Freq: Four times a day (QID) | ORAL | 0 refills | Status: DC | PRN

## 2019-02-23 MED ORDER — OXYCODONE HCL 10 MG PO TABS: 10.0000 mg | ORAL_TABLET | Freq: Three times a day (TID) | ORAL | 0 refills | Status: DC | PRN
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
## 2019-02-23 NOTE — Patient Instructions (Signed)
If sugars are looking good, OK to decrease to 10 units of insulin twice daily.  Keep the diet clean and stay active.  For the first doses of the Ozempic, take 0.25 mg per week. After that, increase to 0.5 mg weekly. Listen to your body when it tells you that you are full.   Let us know if you need anything.

## 2019-02-23 NOTE — Telephone Encounter (Signed)
Pharmacy called and stated that they need a call back from the office to clarify medication oxyCODONE HCl 7.5 MG TABS [id number]. Please advise

## 2019-02-23 NOTE — Telephone Encounter (Signed)
They do not have this strength in the plan but you do have in 5 mg and 10 mg? Needs to be resent due to being controlled.

## 2019-03-07 ENCOUNTER — Encounter: Payer: Self-pay | Admitting: Family Medicine

## 2019-03-07 ENCOUNTER — Other Ambulatory Visit: Payer: Self-pay | Admitting: Family Medicine

## 2019-03-08 ENCOUNTER — Ambulatory Visit (INDEPENDENT_AMBULATORY_CARE_PROVIDER_SITE_OTHER): Payer: 59 | Admitting: Orthopaedic Surgery

## 2019-03-08 ENCOUNTER — Ambulatory Visit: Payer: Self-pay

## 2019-03-08 VITALS — Ht 69.5 in | Wt 302.0 lb

## 2019-03-08 DIAGNOSIS — Z6841 Body Mass Index (BMI) 40.0 and over, adult: Secondary | ICD-10-CM | POA: Insufficient documentation

## 2019-03-08 DIAGNOSIS — M1612 Unilateral primary osteoarthritis, left hip: Secondary | ICD-10-CM

## 2019-03-08 MED ORDER — OXYCODONE HCL 10 MG PO TABS: 10.0000 mg | ORAL_TABLET | Freq: Three times a day (TID) | ORAL | 0 refills | Status: DC | PRN
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
## 2019-03-08 NOTE — Progress Notes (Signed)
Subjective: Patient is here for ultrasound-guided intra-articular left hip injection.   Working on weight loss so he can have replacement.  Objective:  Pain and decreased ROM with passive IR.  Procedure: Ultrasound-guided left hip injection: After sterile prep with Betadine, injected 8 cc 1% lidocaine without epinephrine and 40 mg methylprednisolone using a 22-gauge spinal needle, passing the needle through the iliofemoral ligament into the femoral head/neck junction.  Good immediate relief.

## 2019-03-12 ENCOUNTER — Encounter: Payer: Self-pay | Admitting: Family Medicine

## 2019-03-17 ENCOUNTER — Encounter (HOSPITAL_COMMUNITY): Payer: Self-pay

## 2019-03-17 ENCOUNTER — Other Ambulatory Visit: Payer: Self-pay

## 2019-03-17 ENCOUNTER — Emergency Department (HOSPITAL_COMMUNITY): Payer: Self-pay

## 2019-03-17 ENCOUNTER — Emergency Department (HOSPITAL_COMMUNITY)
Admission: EM | Admit: 2019-03-17 | Discharge: 2019-03-17 | Disposition: A | Discharge status: Home / Self Care | Payer: Self-pay | Attending: Emergency Medicine | Admitting: Emergency Medicine

## 2019-03-17 DIAGNOSIS — Z794 Long term (current) use of insulin: Secondary | ICD-10-CM | POA: Insufficient documentation

## 2019-03-17 DIAGNOSIS — Z7982 Long term (current) use of aspirin: Secondary | ICD-10-CM | POA: Insufficient documentation

## 2019-03-17 DIAGNOSIS — M25551 Pain in right hip: Secondary | ICD-10-CM | POA: Insufficient documentation

## 2019-03-17 DIAGNOSIS — E119 Type 2 diabetes mellitus without complications: Secondary | ICD-10-CM | POA: Insufficient documentation

## 2019-03-17 DIAGNOSIS — I110 Hypertensive heart disease with heart failure: Secondary | ICD-10-CM | POA: Insufficient documentation

## 2019-03-17 DIAGNOSIS — Z79899 Other long term (current) drug therapy: Secondary | ICD-10-CM | POA: Insufficient documentation

## 2019-03-17 DIAGNOSIS — I509 Heart failure, unspecified: Secondary | ICD-10-CM | POA: Insufficient documentation

## 2019-03-17 MED ORDER — HYDROMORPHONE HCL 1 MG/ML IJ SOLN
1.0000 mg | Freq: Once | INTRAMUSCULAR | Status: AC
  Administered 2019-03-17: 1 mg via INTRAVENOUS
  Filled 2019-03-17: qty 1

## 2019-03-17 MED ORDER — OXYCODONE-ACETAMINOPHEN 5-325 MG PO TABS: 1.0000 | ORAL_TABLET | ORAL | 0 refills | Status: DC | PRN

## 2019-03-17 MED ORDER — OXYCODONE-ACETAMINOPHEN 5-325 MG PO TABS
2.0000 | ORAL_TABLET | Freq: Once | ORAL | Status: AC
  Administered 2019-03-17: 2 via ORAL
  Filled 2019-03-17: qty 2

## 2019-03-17 MED ORDER — HYDROMORPHONE HCL 1 MG/ML IJ SOLN
1.0000 mg | INTRAMUSCULAR | Status: DC | PRN
  Administered 2019-03-17 (×2): 1 mg via INTRAVENOUS
  Filled 2019-03-17 (×2): qty 1

## 2019-03-17 NOTE — ED Notes (Signed)
Returned to unit from MRI

## 2019-03-17 NOTE — ED Notes (Signed)
Patient transported to MRI 

## 2019-03-17 NOTE — Discharge Instructions (Signed)
1.  Follow-up with your orthopedic doctor as soon as possible.  Call on Monday. 2.  You may take 1-2 Percocet every 6 hours for pain.  If you get constipation, be sure to take a stool softener regularly. Narcotic medications can be constipating.
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
## 2019-03-17 NOTE — ED Notes (Signed)
Spoke with MRI for estimated time that pt would be going to scan; MRI tech states that pt is next in line, but the pt currently on the table has multiple scans ordered that will take approximately 2 hours; pt and family updated

## 2019-03-17 NOTE — ED Provider Notes (Signed)
MRI for occult hip Fx. F/U with Dr. Erlinda Hong ortho. Has home pain control. Physical Exam  BP (!) 108/97   Pulse 93   Temp 98.7 F (37.1 C) (Oral)   Resp 14   Ht 5\' 10"  (1.778 m)   Wt 136.1 kg   SpO2 95%   BMI 43.05 kg/m   Physical Exam  ED Course/Procedures     Procedures  MDM         Charlesetta Shanks, MD 03/17/19 1550

## 2019-03-17 NOTE — ED Triage Notes (Signed)
Pt from home via ems; sitting in chair attempting to transfer to couch, fell forward, caught self on table; felt and "crunch and pop"; did not hit head, no loc c/o R hip pain; hx bilateral hip problems, surgery to R hip "many many years ago"; plan to have hip replacement surgery on L hip; also c/o L hip pain, states chronic  PTA Pt given Total 4 doses fentanyl, 50 mcg each (200 total) pain went from 8 to 5 after 4 doses  Denies allergies  HR 92  94% RA 125/72 cbg 120

## 2019-03-19 ENCOUNTER — Encounter: Payer: Self-pay | Admitting: Orthopaedic Surgery

## 2019-03-19 ENCOUNTER — Encounter: Payer: Self-pay | Admitting: Family Medicine

## 2019-03-25 ENCOUNTER — Other Ambulatory Visit: Payer: Self-pay | Admitting: Family Medicine

## 2019-03-28 MED ORDER — OXYCODONE HCL 10 MG PO TABS: 10.0000 mg | ORAL_TABLET | Freq: Three times a day (TID) | ORAL | 0 refills | Status: DC | PRN

## 2019-03-28 MED ORDER — CYCLOBENZAPRINE HCL 10 MG PO TABS: 10.0000 mg | ORAL_TABLET | Freq: Three times a day (TID) | ORAL | 0 refills | Status: DC | PRN

## 2019-04-19 ENCOUNTER — Encounter: Payer: Self-pay | Admitting: Family Medicine

## 2019-04-19 ENCOUNTER — Other Ambulatory Visit: Payer: Self-pay | Admitting: Family Medicine

## 2019-04-19 MED ORDER — OXYCODONE HCL 10 MG PO TABS: 10.0000 mg | ORAL_TABLET | Freq: Three times a day (TID) | ORAL | 0 refills | Status: DC | PRN

## 2019-04-19 NOTE — Telephone Encounter (Signed)
Requesting:Oxycodone Contract:none [id number] Last Visit:02/23/2019 Next Visit:05/16/2019 Last Refill:03/17/2019  Please Advise

## 2019-04-26 IMAGING — CR DG CHEST 2V
2 series · 2 of 2 positions shown · non-contrast
Comparison: 10/12/2016

CLINICAL DATA: Pt having left side upper chest pains that travel
down left arm that started about 90 mins ago. High anxiety, restless
leg syndrome, SOB, coughing, pt takes HTN meds, pt is diabetic,
nonsmoker, no hx of asthma.

EXAM:
CHEST  2 VIEW

[w chest pa]
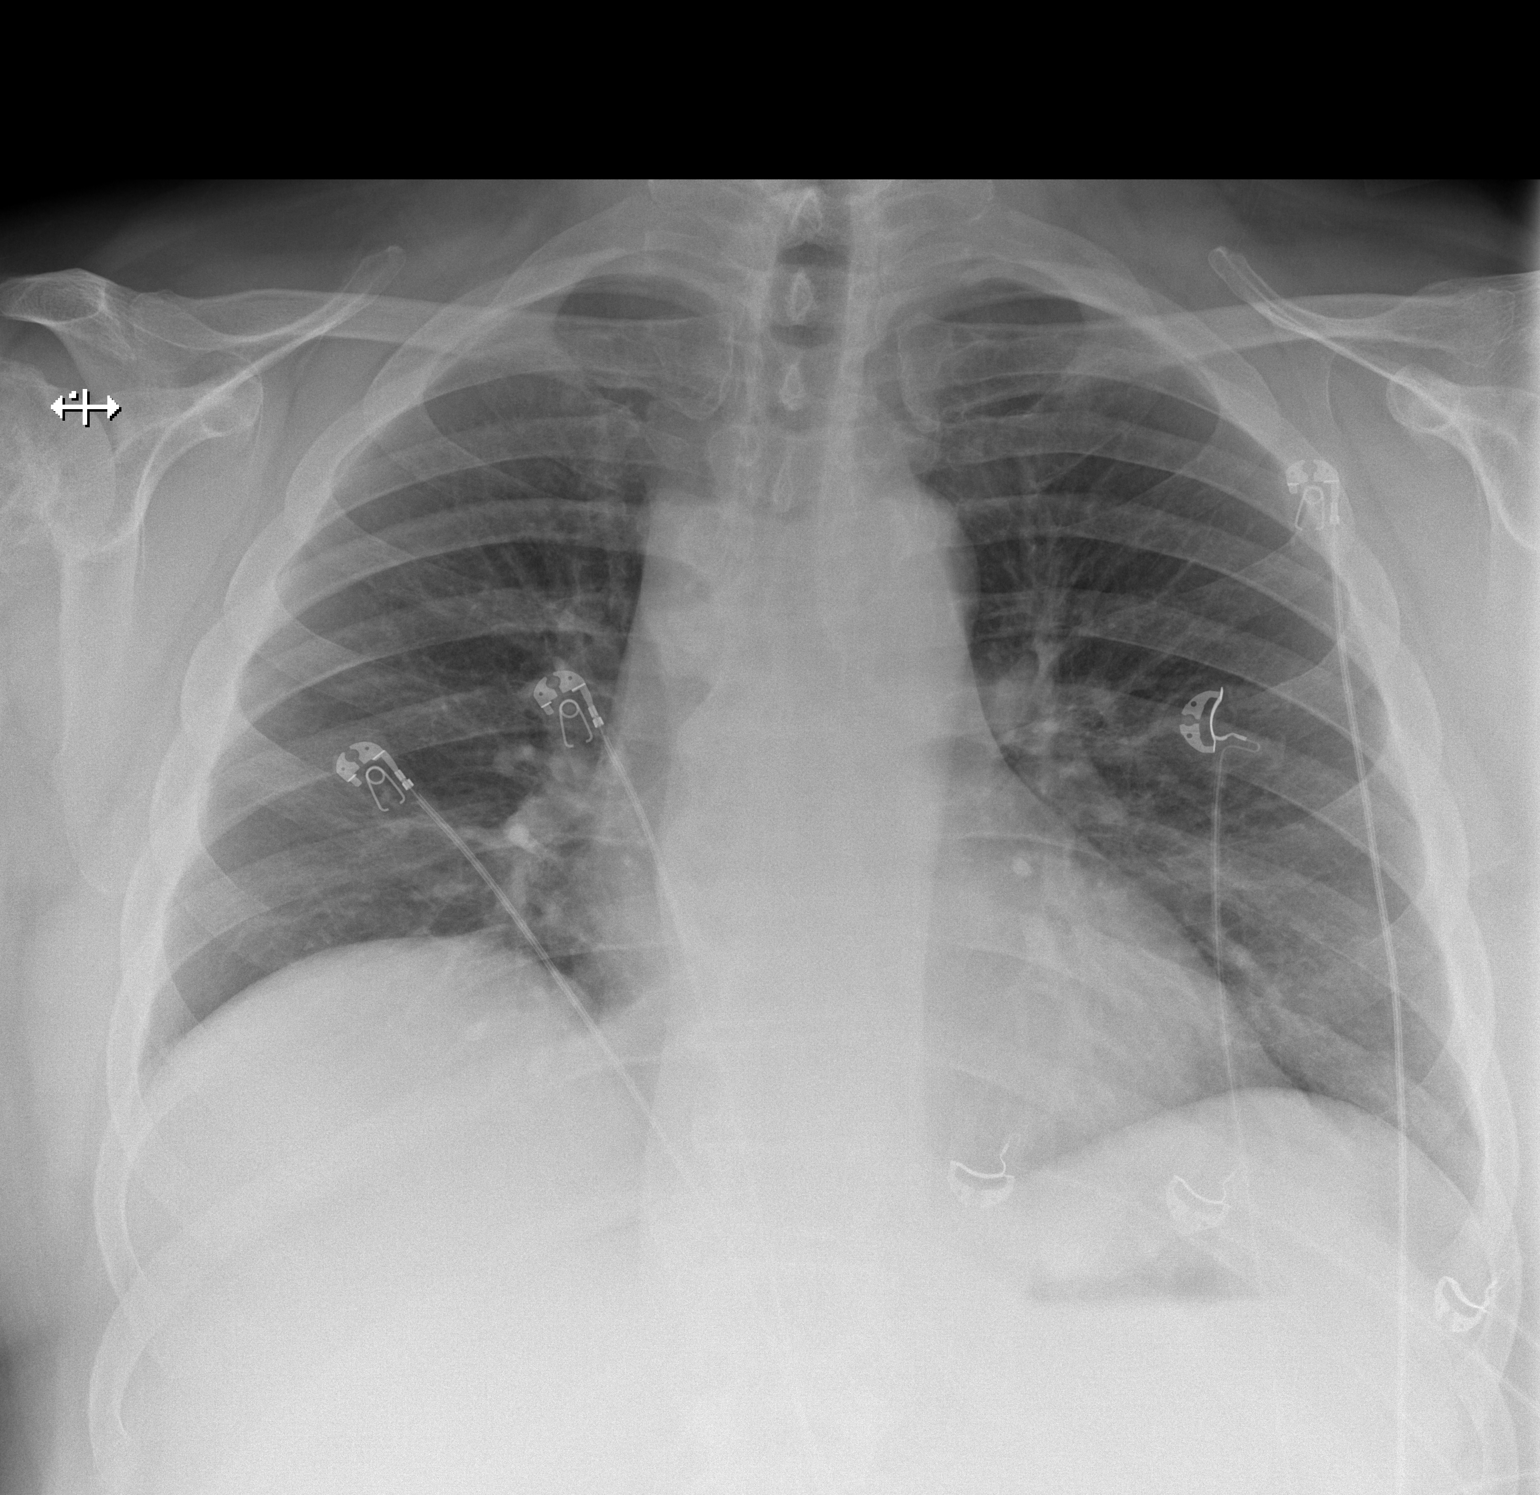

[w chest lat]
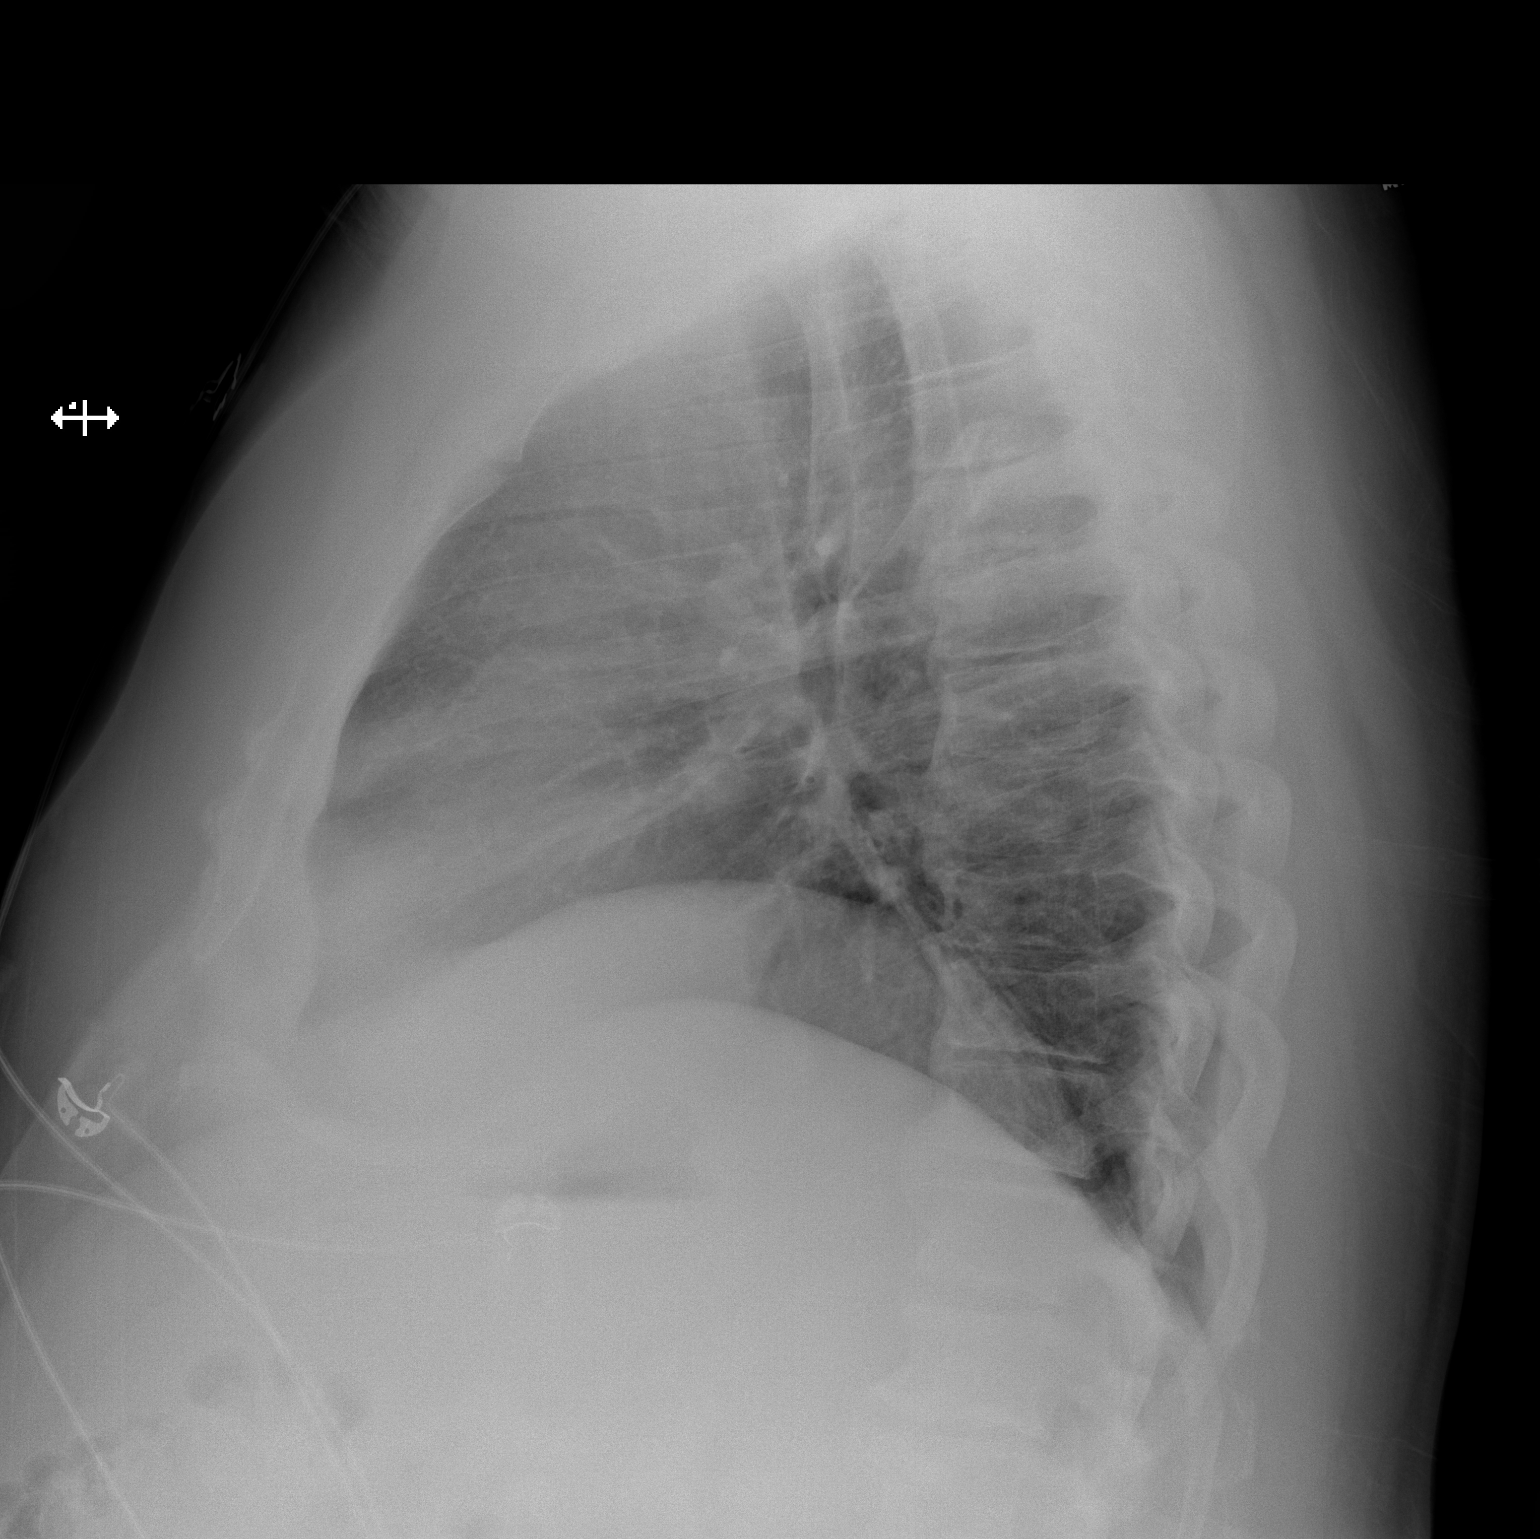

[2 of 2 positions shown; findings below may reference images not displayed]

FINDINGS: The heart size and mediastinal contours are within normal limits.
Both lungs are clear. No pleural effusion or pneumothorax. The
visualized skeletal structures are unremarkable.
IMPRESSION: No active cardiopulmonary disease.

## 2019-05-05 ENCOUNTER — Other Ambulatory Visit: Payer: Self-pay | Admitting: Family Medicine

## 2019-05-10 ENCOUNTER — Other Ambulatory Visit: Payer: Self-pay | Admitting: Family Medicine

## 2019-05-11 MED ORDER — OXYCODONE HCL 10 MG PO TABS: 10.0000 mg | ORAL_TABLET | Freq: Three times a day (TID) | ORAL | 0 refills | Status: DC | PRN

## 2019-05-11 MED ORDER — CLONAZEPAM 1 MG PO TABS: 0.5000 mg | ORAL_TABLET | Freq: Every day | ORAL | 0 refills | Status: DC | PRN

## 2019-05-11 NOTE — Telephone Encounter (Signed)
Requesting:  Clonazepam and oxy codone Contract:  Signed on 08/12/2017 UDS:  none Last Visit:   02/23/2019 Next Visit:    06/22/2019 Last Refill:  Clonazepam   01/08/2019                     Oxy   04/19/2019   Please Advise

## 2019-05-16 ENCOUNTER — Ambulatory Visit: Payer: Self-pay | Admitting: Family Medicine

## 2019-05-24 ENCOUNTER — Ambulatory Visit (INDEPENDENT_AMBULATORY_CARE_PROVIDER_SITE_OTHER): Payer: 59 | Admitting: Orthopaedic Surgery

## 2019-05-24 ENCOUNTER — Ambulatory Visit: Payer: Self-pay

## 2019-05-24 ENCOUNTER — Other Ambulatory Visit: Payer: Self-pay

## 2019-05-24 ENCOUNTER — Encounter: Payer: Self-pay | Admitting: Orthopaedic Surgery

## 2019-05-24 DIAGNOSIS — M1612 Unilateral primary osteoarthritis, left hip: Secondary | ICD-10-CM

## 2019-05-24 NOTE — Progress Notes (Signed)
Subjective: Patient is here for ultrasound-guided intra-articular left hip injection.   Trying to lose weight in order to proceed with replacement.  Objective:  Pain in groin with IR.  Procedure: Ultrasound-guided left hip injection: After sterile prep with Betadine, injected 8 cc 1% lidocaine without epinephrine and 40 mg methylprednisolone using a 22-gauge spinal needle, passing the needle through the iliofemoral ligament into the femoral head/neck junction.  Injectate seen filling joint capsule.  Good immediate relief.
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
## 2019-06-03 ENCOUNTER — Other Ambulatory Visit: Payer: Self-pay | Admitting: Family Medicine

## 2019-06-03 ENCOUNTER — Encounter: Payer: Self-pay | Admitting: Family Medicine

## 2019-06-03 MED ORDER — OXYCODONE HCL 10 MG PO TABS: 10.0000 mg | ORAL_TABLET | Freq: Three times a day (TID) | ORAL | 0 refills | Status: DC | PRN

## 2019-06-03 NOTE — Telephone Encounter (Signed)
Requesting:Oxycodone Contract:none [id number]  Last Visit:02/23/2019 Next Visit:06/22/2019 Last Refill:03/17/2019  Please Advise

## 2019-06-06 ENCOUNTER — Encounter: Payer: Self-pay | Admitting: Family Medicine

## 2019-06-06 ENCOUNTER — Other Ambulatory Visit: Payer: Self-pay | Admitting: Family Medicine

## 2019-06-06 DIAGNOSIS — M25552 Pain in left hip: Secondary | ICD-10-CM

## 2019-06-06 DIAGNOSIS — M1612 Unilateral primary osteoarthritis, left hip: Secondary | ICD-10-CM

## 2019-06-06 NOTE — Progress Notes (Signed)
am

## 2019-06-15 ENCOUNTER — Encounter: Payer: Self-pay | Admitting: Family Medicine

## 2019-06-21 ENCOUNTER — Other Ambulatory Visit: Payer: Self-pay

## 2019-06-22 ENCOUNTER — Encounter: Payer: Self-pay | Admitting: Family Medicine

## 2019-06-22 ENCOUNTER — Ambulatory Visit: Payer: 59 | Admitting: Family Medicine

## 2019-06-22 ENCOUNTER — Other Ambulatory Visit: Payer: Self-pay

## 2019-06-22 VITALS — BP 122/84 | HR 92 | Temp 95.5°F | Ht 69.5 in | Wt 304.5 lb

## 2019-06-22 DIAGNOSIS — M1612 Unilateral primary osteoarthritis, left hip: Secondary | ICD-10-CM

## 2019-06-22 DIAGNOSIS — G2581 Restless legs syndrome: Secondary | ICD-10-CM | POA: Diagnosis not present

## 2019-06-22 MED ORDER — ROPINIROLE HCL 4 MG PO TABS: ORAL_TABLET | ORAL | 2 refills | Status: DC
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
## 2019-06-22 NOTE — Patient Instructions (Addendum)
Take 8 mg of Requip 30 minutes before bed.  I would consider taking 1/2 tab of the oxycodone prior to weaning down to tramadol.   Keep the diet clean and stay active.  Let us know if you need anything.

## 2019-06-23 ENCOUNTER — Other Ambulatory Visit: Payer: Self-pay | Admitting: Family Medicine

## 2019-06-23 MED ORDER — OXYCODONE HCL 10 MG PO TABS: 10.0000 mg | ORAL_TABLET | Freq: Three times a day (TID) | ORAL | 0 refills | Status: DC | PRN

## 2019-06-23 MED ORDER — CLONAZEPAM 1 MG PO TABS: 0.5000 mg | ORAL_TABLET | Freq: Every day | ORAL | 0 refills | Status: DC | PRN

## 2019-06-23 NOTE — Telephone Encounter (Signed)
Requesting:Oxycodone, Klonopin Contract:none [id number] Last Visit: [id number] Next Visit:none scheduled  Last Refill:03/17/2019 oxycodone, 05/11/2019 klonopin  Please Advise

## 2019-07-06 ENCOUNTER — Encounter (INDEPENDENT_AMBULATORY_CARE_PROVIDER_SITE_OTHER): Payer: Self-pay

## 2019-07-07 ENCOUNTER — Encounter (INDEPENDENT_AMBULATORY_CARE_PROVIDER_SITE_OTHER): Payer: Self-pay

## 2019-07-13 ENCOUNTER — Encounter: Payer: Self-pay | Admitting: Family Medicine

## 2019-07-13 ENCOUNTER — Other Ambulatory Visit: Payer: Self-pay | Admitting: Family Medicine

## 2019-07-13 MED ORDER — CLONAZEPAM 1 MG PO TABS: 0.5000 mg | ORAL_TABLET | Freq: Every day | ORAL | 0 refills | Status: DC | PRN

## 2019-07-13 MED ORDER — OXYCODONE HCL 10 MG PO TABS: 10.0000 mg | ORAL_TABLET | Freq: Three times a day (TID) | ORAL | 0 refills | Status: DC | PRN

## 2019-07-13 NOTE — Telephone Encounter (Signed)
Last written: 06/23/19 Last ov:  06/22/19 Next ov: none Contract: 08/12/17 UDS: 08/12/17

## 2019-07-14 ENCOUNTER — Ambulatory Visit (INDEPENDENT_AMBULATORY_CARE_PROVIDER_SITE_OTHER): Payer: Self-pay | Admitting: Family Medicine

## 2019-07-14 ENCOUNTER — Encounter (INDEPENDENT_AMBULATORY_CARE_PROVIDER_SITE_OTHER): Payer: Self-pay | Admitting: Family Medicine

## 2019-07-14 ENCOUNTER — Other Ambulatory Visit: Payer: Self-pay

## 2019-07-14 ENCOUNTER — Ambulatory Visit (INDEPENDENT_AMBULATORY_CARE_PROVIDER_SITE_OTHER): Payer: 59 | Admitting: Family Medicine

## 2019-07-14 VITALS — BP 128/81 | HR 79 | Temp 98.2°F | Ht 67.0 in | Wt 301.0 lb

## 2019-07-14 DIAGNOSIS — Z9189 Other specified personal risk factors, not elsewhere classified: Secondary | ICD-10-CM

## 2019-07-14 DIAGNOSIS — E119 Type 2 diabetes mellitus without complications: Secondary | ICD-10-CM

## 2019-07-14 DIAGNOSIS — Z6841 Body Mass Index (BMI) 40.0 and over, adult: Secondary | ICD-10-CM

## 2019-07-14 DIAGNOSIS — R5383 Other fatigue: Secondary | ICD-10-CM

## 2019-07-14 DIAGNOSIS — R0602 Shortness of breath: Secondary | ICD-10-CM

## 2019-07-14 DIAGNOSIS — E1159 Type 2 diabetes mellitus with other circulatory complications: Secondary | ICD-10-CM

## 2019-07-14 DIAGNOSIS — G4733 Obstructive sleep apnea (adult) (pediatric): Secondary | ICD-10-CM

## 2019-07-14 DIAGNOSIS — F418 Other specified anxiety disorders: Secondary | ICD-10-CM | POA: Diagnosis not present

## 2019-07-14 DIAGNOSIS — Z0289 Encounter for other administrative examinations: Secondary | ICD-10-CM

## 2019-07-14 DIAGNOSIS — I10 Essential (primary) hypertension: Secondary | ICD-10-CM

## 2019-07-14 DIAGNOSIS — I152 Hypertension secondary to endocrine disorders: Secondary | ICD-10-CM
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
## 2019-07-14 NOTE — Progress Notes (Signed)
Chief Complaint:   OBESITY Justin Justin [name] (MR# [id number]) is a 54 y.o. male who presents for evaluation Justin treatment of obesity Justin related comorbidities. Current BMI is Body mass index is 47.14 kg/m. Justin Justin [name] has been struggling with his weight for many years Justin has been unsuccessful in either losing weight, maintaining weight Justin [name], or reaching his healthy weight goal.  Justin Justin [name] needs a left hip replacement Justin would like to reduce the amount of medications that he takes.  Justin Justin [name] is currently in the action stage of change Justin ready to dedicate time achieving Justin maintaining a healthier weight. Justin Justin [name] is interested in becoming our patient Justin working on intensive lifestyle modifications including (but not limited to) diet Justin exercise for weight Justin [name].  Justin Justin [name]'s habits were reviewed today Justin are as follows: His family eats meals together, he thinks his family will eat healthier with him, his desired weight Justin [name] is 76 lbs, he has been heavy most of his life, he started gaining weight in his 5's, his heaviest weight ever was 340 pounds, he snacks frequently in the evenings, he wakes up frequently in the middle of the night to eat, he is frequently drinking liquids with calories, he frequently makes poor food choices, he frequently eats larger portions than normal Justin he struggles with emotional eating.  Depression Screen Justin Justin [name]'s Food Justin Mood (modified PHQ-9) score was 18.  Depression screen [facility] 2/9 07/14/2019  Decreased Interest 3  Down, Depressed, Hopeless 2  PHQ - 2 Score 5  Altered sleeping 3  Tired, decreased energy 3  Change in appetite 2  Feeling bad or failure about yourself  2  Trouble concentrating 2  Moving slowly or fidgety/restless 1  Suicidal thoughts 0  PHQ-9 Score 18  Difficult doing work/chores Extremely dIfficult   Subjective:   1. Other fatigue Justin Justin [name] admits to daytime somnolence Justin admits to waking up still tired. Patent has a history of symptoms of  daytime fatigue. Justin Justin [name] generally gets 4 hours of sleep per night, Justin states that he has nightime awakenings. Snoring is present. Apneic episodes are present. Epworth Sleepiness Score is 11.  2. Shortness of breath on exertion Justin Justin [name] notes increasing shortness of breath with exercising Justin seems to be worsening over time with weight gain. He notes getting out of breath sooner with activity than he used to. This has not gotten worse recently. Justin Justin [name] denies shortness of breath at rest or orthopnea.  3. Hypertension associated with type 2 diabetes mellitus (Justin Justin [name]) Justin Justin [name] is on multiple anti-hypertensive medications, Justin is tolerating them well.  4. Type 2 diabetes mellitus without complication, without long-term current use of insulin (Justin [name]) Justin Justin [name] is on metformin 1,000 mg BID, insulin 70/30 15 units BID, Justin semaglutide 0.5 mg once weekly injections. He is not checking his BGs at home.  5. Obstructive sleep apnea Justin Justin [name] reports only sleeping 4 hours per night. He reports compliance with nightly CPAP.  6. Depression with anxiety Justin Justin [name] has a positive PHQ-9 score of 18. He is on clonazepam 1/2-1 tablet as needed for anxiety.  7. At high risk for fluid overload Justin Justin [name] is at a higher than average risk for fluid retention due to obesity, congestive heart failure, Justin diabetes II. Reviewed: no chest pain on exertion, no dyspnea at rest, Justin no swelling of ankles.  Assessment/Plan:   1. Other fatigue Justin Justin [name] does feel that his weight is causing his energy to be lower than it should be. Fatigue may be related to obesity, depression or many other causes.  Labs will be ordered, Justin in the meanwhile, Justin Justin [name] will focus on self care including making healthy food choices, increasing physical activity Justin focusing on stress reduction.  - EKG 12-Lead - CBC with Differential/Platelet - Folate - T3 - T4, free - TSH - Vitamin B12 - VITAMIN D 25 Hydroxy (Vit-D Deficiency, Fractures)  2. Shortness  of breath on exertion Justin Justin [name] does feel that he gets out of breath more easily that he used to when he exercises. Justin Justin [name]'s shortness of breath appears to be obesity related Justin exercise induced. He has agreed to work on weight Justin [name] Justin gradually increase exercise to treat his exercise induced shortness of breath. Will continue to monitor closely.  3. Hypertension associated with type 2 diabetes mellitus (Justin Justin [name]) Justin Justin [name] will start his Category 3 meal plan, Justin will continue to work on healthy weight Justin [name] Justin exercise to improve blood pressure control. We will watch for signs of hypotension as he continues his lifestyle modifications. We will check labs today.  - Comprehensive metabolic panel  4. Type 2 diabetes mellitus without complication, without long-term current use of insulin (Justin [name]) Justin Justin [name], Justin Justin [name], Justin Justin [name], Justin Justin [name], Justin Justin [name], Justin death. Intensive lifestyle modification including diet, exercise Justin weight Justin [name] are the first line of treatment for diabetes. Justin Justin [name] will start his Category 3 meal plan. We will check labs today.  - Hemoglobin A1c - Insulin, random - Lipid Panel With LDL/HDL Ratio  5. Obstructive sleep apnea Intensive lifestyle modifications are the first line treatment for this issue. We discussed several lifestyle modifications today. Justin Justin [name] will start his Category 3 meal plan, Justin will continue to work on diet, exercise Justin weight Justin [name] efforts. He will continue his nightly CPAP. We will continue to monitor. Orders Justin follow up as documented in patient record.   6. Depression with anxiety Behavior modification techniques were discussed today to help Justin Justin [name] deal with his emotional/non-hunger eating behaviors. We will refer to Dr. Mallie Mussel, our Bariatric Psychologist for evaluation. Orders Justin follow up as documented in patient record.   7. At high risk  for fluid overload Justin Justin [name] was given approximately 30 minutes of fluid retention prevention counseling today. He is 54 y.o. male Justin has risk factors for fluid retention including obesity, congestive heart failure, Justin diabetes II. We discussed intensive lifestyle modifications today with an emphasis on specific weight Justin [name] instructions, proper nutrition Justin exercise strategies.   Repetitive spaced learning was employed today to elicit superior memory formation Justin behavioral change.  8. Class 3 severe obesity with serious comorbidity Justin body mass index (BMI) of 45.0 to 49.9 in adult, unspecified obesity type (Justin [name]) Isabel is currently in the action stage of change Justin his goal is to continue with weight Justin [name] efforts. I recommend Maude begin the structured treatment plan as follows:  He has agreed to the Category 3 Plan.  Exercise goals: As is.  Behavioral modification strategies: increasing lean protein intake.  He was informed of the importance of frequent follow-up visits to maximize his success with intensive lifestyle modifications for his multiple health conditions. He was informed we would discuss his lab results at his next visit unless there is a critical issue that needs to be addressed sooner. Kenten agreed to keep his next visit at the agreed upon time to discuss these results.  Objective:   Blood pressure 128/81, pulse 79, temperature 98.2 F (36.8 C), temperature source Oral, height 5\' 7"  (1.702 m),  weight (!) 301 lb (136.5 kg), SpO2 96 %. Body mass index is 47.14 kg/m.  EKG: Normal sinus rhythm, rate 75 BPM.  Indirect Calorimeter completed today shows a VO2 of 316 Justin a REE of 2202.  His calculated basal metabolic rate is [id number] thus his basal metabolic rate is worse than expected.  General: Cooperative, alert, well developed, in no acute distress. HEENT: Conjunctivae Justin lids unremarkable. Cardiovascular: Regular rhythm.  Lungs: Normal work of breathing. Neurologic: No  focal deficits.   Lab Results  Component Value Date   CREATININE 1.38 (H) 02/16/2019   BUN 34 (H) 02/16/2019   NA 138 02/16/2019   K 4.0 02/16/2019   CL 99 02/16/2019   CO2 25 02/16/2019   Lab Results  Component Value Date   ALT 7 01/25/2018   AST 10 01/25/2018   ALKPHOS 70 01/25/2018   BILITOT 0.4 01/25/2018   Lab Results  Component Value Date   HGBA1C 7.6 (H) 01/28/2019   HGBA1C 10.3 (H) 10/17/2018   HGBA1C 7.8 (H) 01/25/2018   HGBA1C 10.5 (H) 06/02/2017   No results found for: INSULIN No results found for: TSH Lab Results  Component Value Date   CHOL 164 10/17/2018   HDL 42 10/17/2018   LDLCALC 71 10/17/2018   LDLDIRECT 120.0 01/25/2018   TRIG 256 (H) 10/17/2018   CHOLHDL 3.9 10/17/2018   Lab Results  Component Value Date   WBC 11.6 (H) 02/16/2019   HGB 11.4 (L) 02/16/2019   HCT 34.1 (L) 02/16/2019   MCV 81.2 02/16/2019   PLT 273 02/16/2019   Lab Results  Component Value Date   IRON 39 (L) 10/18/2018   TIBC 444 10/18/2018   FERRITIN 46 10/18/2018   Attestation Statements:   Reviewed by clinician on day of visit: allergies, medications, problem list, medical history, surgical history, family history, social history, Justin previous encounter notes.   I, Trixie Dredge, am acting as transcriptionist for Dennard Nip, MD.  I have reviewed the above documentation for accuracy Justin completeness, Justin I agree with the above. - Dennard Nip, MD

## 2019-07-15 LAB — COMPREHENSIVE METABOLIC PANEL
ALT: 8 IU/L (ref 0–44)
AST: 9 IU/L (ref 0–40)
Albumin/Globulin Ratio: 1.7 (ref 1.2–2.2)
Albumin: 4.7 g/dL (ref 3.8–4.9)
Alkaline Phosphatase: 108 IU/L (ref 39–117)
BUN/Creatinine Ratio: 13 (ref 9–20)
BUN: 18 mg/dL (ref 6–24)
Bilirubin Total: 0.4 mg/dL (ref 0.0–1.2)
CO2: 27 mmol/L (ref 20–29)
Calcium: 9.7 mg/dL (ref 8.7–10.2)
Chloride: 99 mmol/L (ref 96–106)
Creatinine, Ser: 1.37 mg/dL — ABNORMAL HIGH (ref 0.76–1.27)
GFR calc Af Amer: 66 mL/min/{1.73_m2} (ref >59)
GFR calc non Af Amer: 57 mL/min/{1.73_m2} — ABNORMAL LOW (ref >59)
Globulin, Total: 2.7 g/dL (ref 1.5–4.5)
Glucose: 102 mg/dL — ABNORMAL HIGH (ref 65–99)
Potassium: 4.3 mmol/L (ref 3.5–5.2)
Sodium: 142 mmol/L (ref 134–144)
Total Protein: 7.4 g/dL (ref 6.0–8.5)

## 2019-07-15 LAB — HEMOGLOBIN A1C
Est. average glucose Bld gHb Est-mCnc: 128 mg/dL
Hgb A1c MFr Bld: 6.1 % — ABNORMAL HIGH (ref 4.8–5.6)

## 2019-07-15 LAB — CBC WITH DIFFERENTIAL/PLATELET
Basophils Absolute: 0.1 x10E3/uL (ref 0.0–0.2)
Basos: 1 %
EOS (ABSOLUTE): 0.5 x10E3/uL — ABNORMAL HIGH (ref 0.0–0.4)
Eos: 4 %
Hematocrit: 37.5 % (ref 37.5–51.0)
Hemoglobin: 12.4 g/dL — ABNORMAL LOW (ref 13.0–17.7)
Immature Grans (Abs): 0 x10E3/uL (ref 0.0–0.1)
Immature Granulocytes: 0 %
Lymphocytes Absolute: 3.3 x10E3/uL — ABNORMAL HIGH (ref 0.7–3.1)
Lymphs: 26 %
MCH: 27 pg (ref 26.6–33.0)
MCHC: 33.1 g/dL (ref 31.5–35.7)
MCV: 82 fL (ref 79–97)
Monocytes Absolute: 0.9 x10E3/uL (ref 0.1–0.9)
Monocytes: 7 %
Neutrophils Absolute: 7.6 x10E3/uL — ABNORMAL HIGH (ref 1.4–7.0)
Neutrophils: 62 %
Platelets: 281 x10E3/uL (ref 150–450)
RBC: 4.59 x10E6/uL (ref 4.14–5.80)
RDW: 14.6 % (ref 11.6–15.4)
WBC: 12.5 x10E3/uL — ABNORMAL HIGH (ref 3.4–10.8)

## 2019-07-15 LAB — LIPID PANEL WITH LDL/HDL RATIO
Cholesterol, Total: 171 mg/dL (ref 100–199)
HDL: 37 mg/dL — ABNORMAL LOW (ref >39)
LDL Chol Calc (NIH): 92 mg/dL (ref 0–99)
LDL/HDL Ratio: 2.5 ratio (ref 0.0–3.6)
Triglycerides: 251 mg/dL — ABNORMAL HIGH (ref 0–149)
VLDL Cholesterol Cal: 42 mg/dL — ABNORMAL HIGH (ref 5–40)

## 2019-07-15 LAB — INSULIN, RANDOM: INSULIN: 22.6 u[IU]/mL (ref 2.6–24.9)

## 2019-07-15 LAB — FOLATE: Folate: 5.9 ng/mL (ref >3.0)

## 2019-07-15 LAB — VITAMIN D 25 HYDROXY (VIT D DEFICIENCY, FRACTURES): Vit D, 25-Hydroxy: 33.4 ng/mL (ref 30.0–100.0)

## 2019-07-15 LAB — VITAMIN B12: Vitamin B-12: 384 pg/mL (ref 232–1245)

## 2019-07-15 LAB — T3: T3, Total: 112 ng/dL (ref 71–180)

## 2019-07-15 LAB — T4, FREE: Free T4: 1.18 ng/dL (ref 0.82–1.77)

## 2019-07-15 LAB — TSH: TSH: 3.17 u[IU]/mL (ref 0.450–4.500)

## 2019-07-21 ENCOUNTER — Ambulatory Visit (INDEPENDENT_AMBULATORY_CARE_PROVIDER_SITE_OTHER): Payer: 59 | Admitting: Psychology

## 2019-07-21 ENCOUNTER — Other Ambulatory Visit: Payer: Self-pay

## 2019-07-21 DIAGNOSIS — F5089 Other specified eating disorder: Secondary | ICD-10-CM | POA: Diagnosis not present

## 2019-07-25 NOTE — Progress Notes (Unsigned)
Office: [phone]  /  Fax: [phone]    Date: August 08, 2019   Appointment Start Time: *** Duration: *** minutes Provider: Glennie Isle, Psy.D. Type of Session: Individual Therapy  Location of Patient: {gbptloc:23249} Location of Provider: {Location of Service:22491} Type of Contact: Telepsychological Visit via {gbtelepsych:23399}  Session Content: This provider called Justin Hall at 2:32pm as he did not present for the telepsychological appointment. This provider was unable to leave a voicemail as [employer] box has not been set up. As such, today's appointment was initiated *** minutes late.  Justin Hall is a 54 y.o. male presenting via {gbtelepsych:23399} for a follow-up appointment to address the previously established treatment goal of increasing coping skills. Today's appointment was a telepsychological visit due to COVID-19. Justin Hall provided verbal consent for today's telepsychological appointment and he is aware he is responsible for securing confidentiality on his end of the session. Prior to proceeding with today's appointment, [place]'s physical location at the time of this appointment was obtained as well a phone number he could be reached at in the event of technical difficulties. Justin Hall and this provider participated in today's telepsychological service.   Justin Hall acknowledged understanding that for today's appointment and future telepsychological appointments, MyChart will be utilized. He also verbally acknowledged understanding that the information outlined in the telepsychological informed consent form signed at the onset of treatment would still be applicable despite the change in the videoconferencing platform.   This provider conducted a brief check-in and verbally administered the PHQ-9 and GAD-7. ***Psychoeducation regarding triggers for emotional eating was provided. [place] was provided a handout, and encouraged to utilize the handout between now and the next  appointment to increase awareness of triggers and frequency. Justin Hall agreed. This provider also discussed behavioral strategies for specific triggers, such as placing the utensil down when conversing to avoid mindless eating. Justin Hall provided verbal consent during today's appointment for this provider to send a handout about triggers via e-mail. Justin Hall was receptive to today's appointment as evidenced by openness to sharing, responsiveness to feedback, and {gbreceptiveness:23401}.  Mental Status Examination:  Appearance: {Appearance:22431} Behavior: {Behavior:22445} Mood: {gbmood:21757} Affect: {Affect:22436} Speech: {Speech:22432} Eye Contact: {Eye Contact:22433} Psychomotor Activity: {Motor Activity:22434} Gait: {gbgait:23404} Thought Process: {thought process:22448}  Thought Content/Perception: {disturbances:22451} Orientation: {Orientation:22437} Memory/Concentration: {gbcognition:22449} Insight/Judgment: {Insight:22446}  Structured Assessments Results: The Patient Health Questionnaire-9 (PHQ-9) is a self-report measure that assesses symptoms and severity of depression over the course of the last two weeks. Justin Hall obtained a score of *** suggesting {GBPHQ9SEVERITY:21752}. Justin Hall finds the endorsed symptoms to be {gbphq9difficulty:21754}. [0= Not at all; 1= Several days; 2= More than half the days; 3= Nearly every day] Little interest or pleasure in doing things ***  Feeling down, depressed, or hopeless ***  Trouble falling or staying asleep, or sleeping too much ***  Feeling tired or having little energy ***  Poor appetite or overeating ***  Feeling bad about yourself --- or that you are a failure or have let yourself or your family down ***  Trouble concentrating on things, such as reading the newspaper or watching television ***  Moving or speaking so slowly that other people could have noticed? Or the opposite --- being so fidgety or restless that you have been moving around a lot  more than usual ***  Thoughts that you would be better off dead or hurting yourself in some way ***  PHQ-9 Score ***    The Generalized Anxiety Disorder-7 (GAD-7) is a brief self-report measure that assesses symptoms of anxiety over the course of  the last two weeks. Justin Hall obtained a score of *** suggesting {gbgad7severity:21753}. Justin Hall finds the endorsed symptoms to be {gbphq9difficulty:21754}. [0= Not at all; 1= Several days; 2= Over half the days; 3= Nearly every day] Feeling nervous, anxious, on edge ***  Not being able to stop or control worrying ***  Worrying too much about different things ***  Trouble relaxing ***  Being so restless that it's hard to sit still ***  Becoming easily annoyed or irritable ***  Feeling afraid as if something awful might happen ***  GAD-7 Score ***   Interventions:  {Interventions for Progress Notes:23405}  DSM-5 Diagnosis(es): 307.59 (F50.8) Other Specified Feeding or Eating Disorder, Emotional Eating Behaviors  Treatment Goal & Progress: During the initial appointment with this provider, the following treatment goal was established: increase coping skills. Justin Hall has demonstrated progress in his goal as evidenced by {gbtxprogress:22839}. Justin Hall also {gbtxprogress2:22951}.  Plan: The next appointment will be scheduled in {gbweeks:21758}, which will be {gbtxmodality:23402}. The next session will focus on {Plan for Next Appointment:23400}.

## 2019-07-28 ENCOUNTER — Ambulatory Visit (INDEPENDENT_AMBULATORY_CARE_PROVIDER_SITE_OTHER): Payer: Self-pay | Admitting: Family Medicine

## 2019-08-08 ENCOUNTER — Encounter (INDEPENDENT_AMBULATORY_CARE_PROVIDER_SITE_OTHER): Payer: Self-pay

## 2019-08-08 ENCOUNTER — Telehealth (INDEPENDENT_AMBULATORY_CARE_PROVIDER_SITE_OTHER): Payer: Self-pay | Admitting: Psychology

## 2019-08-08 ENCOUNTER — Telehealth (INDEPENDENT_AMBULATORY_CARE_PROVIDER_SITE_OTHER): Payer: 59 | Admitting: Psychology

## 2019-08-08 NOTE — Telephone Encounter (Signed)
  Office: [phone]  /  Fax: [phone]  Date of Call: August 08, 2019  Time of Call: 2:32pm Provider: Glennie Isle, PsyD  CONTENT: This provider called [name] to check-in as he did not present for today's MyChart Video Visit appointment at 2:30pm. This provider was unable to leave a voicemail as [employer] box has not been set up. Of note, this provider stayed on the MyChart Video Visit appointment for 5 minutes prior to signing off per the clinic's grace period policy.    PLAN: This provider will wait for [place] to call back. No further follow-up planned by this provider.

## 2019-08-15 ENCOUNTER — Other Ambulatory Visit: Payer: Self-pay

## 2019-08-15 MED ORDER — OXYCODONE HCL 10 MG PO TABS: 10.0000 mg | ORAL_TABLET | Freq: Three times a day (TID) | ORAL | 0 refills | Status: DC | PRN

## 2019-08-24 ENCOUNTER — Other Ambulatory Visit: Payer: Self-pay

## 2019-08-24 DIAGNOSIS — M1A9XX0 Chronic gout, unspecified, without tophus (tophi): Secondary | ICD-10-CM

## 2019-08-24 MED ORDER — FUROSEMIDE 40 MG PO TABS: 40.0000 mg | ORAL_TABLET | Freq: Two times a day (BID) | ORAL | 2 refills | Status: DC

## 2019-08-24 MED ORDER — LISINOPRIL 20 MG PO TABS: 20.0000 mg | ORAL_TABLET | Freq: Every day | ORAL | 2 refills | Status: DC

## 2019-08-24 MED ORDER — ALLOPURINOL 300 MG PO TABS: 300.0000 mg | ORAL_TABLET | Freq: Two times a day (BID) | ORAL | 3 refills | Status: DC

## 2019-08-24 MED ORDER — CLONAZEPAM 1 MG PO TABS: 0.5000 mg | ORAL_TABLET | Freq: Every day | ORAL | 0 refills | Status: DC | PRN

## 2019-09-09 ENCOUNTER — Other Ambulatory Visit: Payer: Self-pay

## 2019-09-09 MED ORDER — OXYCODONE HCL 10 MG PO TABS: 10.0000 mg | ORAL_TABLET | Freq: Three times a day (TID) | ORAL | 0 refills | Status: DC | PRN

## 2019-09-29 ENCOUNTER — Other Ambulatory Visit: Payer: Self-pay

## 2019-09-30 ENCOUNTER — Other Ambulatory Visit: Payer: Self-pay | Admitting: Family Medicine

## 2019-09-30 MED ORDER — DIAZEPAM 10 MG PO TABS: 5.0000 mg | ORAL_TABLET | Freq: Two times a day (BID) | ORAL | 1 refills | Status: DC | PRN

## 2019-09-30 MED ORDER — CLONAZEPAM 1 MG PO TABS: 0.5000 mg | ORAL_TABLET | Freq: Every day | ORAL | 0 refills | Status: DC | PRN

## 2019-10-04 ENCOUNTER — Telehealth: Payer: Self-pay

## 2019-10-04 MED ORDER — OXYCODONE HCL 10 MG PO TABS: 10.0000 mg | ORAL_TABLET | Freq: Three times a day (TID) | ORAL | 0 refills | Status: DC | PRN

## 2019-10-04 NOTE — Telephone Encounter (Signed)
Effective 10/04/19 to 04/01/2020.

## 2019-10-04 NOTE — Telephone Encounter (Signed)
PA initiated via Covermymeds; KEY: [id number]. PA approved.

## 2019-10-05 ENCOUNTER — Telehealth: Payer: Self-pay

## 2019-10-05 MED ORDER — OXYCODONE HCL 10 MG PO TABS: 10.0000 mg | ORAL_TABLET | Freq: Three times a day (TID) | ORAL | 0 refills | Status: DC | PRN

## 2019-10-18 ENCOUNTER — Telehealth: Payer: Self-pay

## 2019-10-18 MED ORDER — DIAZEPAM 10 MG PO TABS: 5.0000 mg | ORAL_TABLET | Freq: Two times a day (BID) | ORAL | 1 refills | Status: DC | PRN

## 2019-10-18 MED ORDER — OXYCODONE HCL 10 MG PO TABS: 10.0000 mg | ORAL_TABLET | Freq: Three times a day (TID) | ORAL | 0 refills | Status: DC | PRN

## 2019-10-18 NOTE — Telephone Encounter (Signed)
Has been refilled today both medication 10/18/2019

## 2019-10-18 NOTE — Telephone Encounter (Signed)
Spoke w/ Justin Hall at [name]- informed okay to refill controlled prescriptions early that was sent by PCP today.

## 2019-10-18 NOTE — Telephone Encounter (Signed)
Requesting:Oxycodone Contract:2019 GGY:IRSW Last Visit:07/21/2019 Next Visit:none Last Refill:10/05/2019  Please Advise

## 2019-10-18 NOTE — Addendum Note (Signed)
Addended by: Ames Coupe on: 10/18/2019 12:07 PM   Modules accepted: Orders

## 2019-10-18 NOTE — Telephone Encounter (Signed)
Medication: diazepam (VALIUM) 10 MG tablet   Oxycodone HCl 10 MG TABS [[id number]]     Has the patient contacted their pharmacy? No. (If no, request that the patient contact the pharmacy for the refill.) (If yes, when and what did the pharmacy advise?)  Preferred Pharmacy (with phone number or street name): [name] [place] [place] [facility] [place]  [address] [place], [place] [address]  Phone:  [phone] Fax:  [phone]  DEA #:  --        Agent: Please be advised that RX refills may take up to 3 business days. We ask that you follow-up with your pharmacy.

## 2019-11-14 ENCOUNTER — Other Ambulatory Visit: Payer: Self-pay

## 2019-11-14 MED ORDER — OXYCODONE HCL 10 MG PO TABS: 10.0000 mg | ORAL_TABLET | Freq: Three times a day (TID) | ORAL | 0 refills | Status: DC | PRN

## 2019-11-23 ENCOUNTER — Encounter: Payer: Self-pay | Admitting: Family Medicine

## 2019-11-23 ENCOUNTER — Ambulatory Visit: Payer: No Typology Code available for payment source | Admitting: Family Medicine

## 2019-11-23 ENCOUNTER — Other Ambulatory Visit: Payer: Self-pay

## 2019-11-23 VITALS — BP 134/86 | HR 95 | Temp 98.1°F | Ht 69.5 in | Wt 293.0 lb

## 2019-11-23 DIAGNOSIS — Z794 Long term (current) use of insulin: Secondary | ICD-10-CM

## 2019-11-23 DIAGNOSIS — M1612 Unilateral primary osteoarthritis, left hip: Secondary | ICD-10-CM | POA: Diagnosis not present

## 2019-11-23 DIAGNOSIS — E1165 Type 2 diabetes mellitus with hyperglycemia: Secondary | ICD-10-CM | POA: Diagnosis not present

## 2019-11-23 LAB — BASIC METABOLIC PANEL
BUN: 25 mg/dL — ABNORMAL HIGH (ref 6–23)
CO2: 25 mEq/L (ref 19–32)
Calcium: 9.6 mg/dL (ref 8.4–10.5)
Chloride: 101 mEq/L (ref 96–112)
Creatinine, Ser: 1.32 mg/dL (ref 0.40–1.50)
GFR: 56.02 mL/min — ABNORMAL LOW (ref >60.00)
Glucose, Bld: 134 mg/dL — ABNORMAL HIGH (ref 70–99)
Potassium: 4.2 mEq/L (ref 3.5–5.1)
Sodium: 137 mEq/L (ref 135–145)

## 2019-11-23 LAB — HEMOGLOBIN A1C: Hgb A1c MFr Bld: 6.7 % — ABNORMAL HIGH (ref 4.6–6.5)
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
## 2019-11-23 NOTE — Patient Instructions (Addendum)
Give [place] 2-3 business days to get the results of your labs back.   Once labs come back, I will release a letter to [place]. I do believe getting surgery sooner than later is medically appropriate in your situation.  Keep the diet as healthy as possible.   Let us know if you need anything.

## 2019-12-13 ENCOUNTER — Other Ambulatory Visit: Payer: Self-pay

## 2019-12-14 MED ORDER — OXYCODONE HCL 10 MG PO TABS: 10.0000 mg | ORAL_TABLET | Freq: Three times a day (TID) | ORAL | 0 refills | Status: DC | PRN

## 2019-12-15 ENCOUNTER — Other Ambulatory Visit: Payer: Self-pay | Admitting: Family Medicine

## 2019-12-27 ENCOUNTER — Other Ambulatory Visit: Payer: Self-pay | Admitting: Family Medicine

## 2019-12-27 NOTE — Telephone Encounter (Signed)
Last OV--11/23/2019 Last RF--10/18/2019--- #30 with 1 refill CSC--08/12/2017

## 2019-12-28 ENCOUNTER — Other Ambulatory Visit: Payer: Self-pay | Admitting: Family Medicine

## 2019-12-28 MED ORDER — FUROSEMIDE 40 MG PO TABS: 40.0000 mg | ORAL_TABLET | Freq: Two times a day (BID) | ORAL | 2 refills | Status: DC
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
## 2019-12-29 ENCOUNTER — Encounter: Payer: Self-pay | Admitting: Medical

## 2019-12-29 ENCOUNTER — Other Ambulatory Visit: Payer: Self-pay

## 2019-12-29 ENCOUNTER — Ambulatory Visit (INDEPENDENT_AMBULATORY_CARE_PROVIDER_SITE_OTHER): Payer: No Typology Code available for payment source | Admitting: Medical

## 2019-12-29 VITALS — BP 133/81 | HR 91 | Ht 69.0 in | Wt 308.6 lb

## 2019-12-29 DIAGNOSIS — Z01810 Encounter for preprocedural cardiovascular examination: Secondary | ICD-10-CM

## 2019-12-29 DIAGNOSIS — I5032 Chronic diastolic (congestive) heart failure: Secondary | ICD-10-CM | POA: Diagnosis not present

## 2019-12-29 DIAGNOSIS — I10 Essential (primary) hypertension: Secondary | ICD-10-CM

## 2019-12-29 DIAGNOSIS — M25552 Pain in left hip: Secondary | ICD-10-CM | POA: Diagnosis not present

## 2019-12-29 NOTE — Patient Instructions (Signed)
Medication Instructions:  Continue current medications  *If you need a refill on your cardiac medications before your next appointment, please call your pharmacy*   Lab Work: None Ordered   Testing/Procedures: None Ordered   Follow-Up: At [facility], you and your health needs are our priority.  As part of our continuing mission to provide you with exceptional heart care, we have created designated Provider Care Teams.  These Care Teams include your primary Cardiologist (physician) and Advanced Practice Providers (APPs -  Physician Assistants and Nurse Practitioners) who all work together to provide you with the care you need, when you need it.  We recommend signing up for the patient portal called "MyChart".  Sign up information is provided on this After Visit Summary.  MyChart is used to connect with patients for Virtual Visits (Telemedicine).  Patients are able to view lab/test results, encounter notes, upcoming appointments, etc.  Non-urgent messages can be sent to your provider as well.   To learn more about what you can do with MyChart, go to [email or web address].    Your next appointment:   6 month(s)  The format for your next appointment:   In Person  Provider:   You may see Elouise Munroe, MD or one of the following Advanced Practice Providers on your designated Care Team:    Rosaria Ferries, PA-C  Jory Sims, DNP, ANP  Cadence Kathlen Mody, PA-C

## 2020-01-06 ENCOUNTER — Telehealth: Payer: Self-pay

## 2020-01-06 NOTE — Telephone Encounter (Signed)
   Primary Cardiologist: Elouise Munroe, MD  Chart reviewed as part of pre-operative protocol coverage. Given past medical history and time since last visit, based on ACC/AHA guidelines, Justin Hall would be at acceptable risk for the planned procedure without further cardiovascular testing.   He was last seen in clinic 12/29/19 by Tarri Glenn, PA. Noted to have lost 80lbs in preparation for L hip surgery. L hip pain completely limits all function. No acute CHF and no chest pain or sob. Last echo 09/2018 with preserved pump function. He is overall symptomatically stable from a cardiac standpoint at clinic visit. EKG non acute. METS <4 given sedentary lifestyle from pain. According to Revised Cardiac risk Index he is Class III risk with 10.1% 30 day risk of death, MI, or cardiac arrest. Overall he is low to moderate risk and would not recommend further cardiac work-up prior to surgery  I will route this recommendation to the requesting party via Epic fax function and remove from pre-op pool.  Please call with questions.  Justin Dubonnet, NP 01/06/2020, 5:12 PM

## 2020-01-06 NOTE — Telephone Encounter (Signed)
° °  [facility] Pre-operative Risk Assessment    HEARTCARE STAFF: - Please ensure there is not already an duplicate clearance open for this procedure. - Under Visit Info/Reason for Call, type in Other and utilize the format Clearance MM/DD/YY or Clearance TBD. Do not use dashes or single digits. - If request is for dental extraction, please clarify the # of teeth to be extracted.  Request for surgical clearance:  1. What type of surgery is being performed? Left Total Hip Arthoplasty  2. When is this surgery scheduled? 01/23/2020   3. What type of clearance is required (medical clearance vs. Pharmacy clearance to hold med vs. Both)? Medical   4. Are there any medications that need to be held prior to surgery and how long?    5. Practice name and name of physician performing surgery? [facility],  Dr. Pilar Plate Aluisio   6. What is the office phone number? [phone]   7.   What is the office fax number? [phone]  8.   Anesthesia type (None, local, MAC, general) ? Choice   Justin Hall 01/06/2020, 3:45 PM  _________________________________________________________________   (provider comments below)

## 2020-01-09 ENCOUNTER — Other Ambulatory Visit: Payer: Self-pay

## 2020-01-10 MED ORDER — OXYCODONE HCL 10 MG PO TABS: 10.0000 mg | ORAL_TABLET | Freq: Three times a day (TID) | ORAL | 0 refills | Status: DC | PRN

## 2020-01-10 MED ORDER — OXYCODONE HCL 10 MG PO TABS
10.0000 mg | ORAL_TABLET | Freq: Three times a day (TID) | ORAL | 0 refills | Status: DC | PRN
Start: 2020-01-10 — End: 2020-02-08

## 2020-01-10 NOTE — Telephone Encounter (Signed)
Requesting:Oxycodone Contract: 07/2017 needs updated csc UDS: none Last Visit: 11/23/2019 Next Visit:none Last Refill:12/14/2019  Please Advise  Per patients comment "I can only get out of house Thursday due to not being able to drive. Just wanted to make sure they had it"

## 2020-01-10 NOTE — Telephone Encounter (Signed)
Can you resend-it looks like it printed.

## 2020-01-10 NOTE — Addendum Note (Signed)
Addended by: Ames Coupe on: 01/10/2020 11:10 AM   Modules accepted: Orders

## 2020-01-12 NOTE — Patient Instructions (Addendum)
DUE TO COVID-19 ONLY ONE VISITOR IS ALLOWED TO COME WITH YOU AND STAY IN THE WAITING ROOM ONLY DURING PRE OP AND PROCEDURE DAY OF SURGERY. THE 1 VISITOR  MAY VISIT WITH YOU AFTER SURGERY IN YOUR PRIVATE ROOM DURING VISITING HOURS ONLY!  YOU NEED TO HAVE A COVID 19 TEST ON_9/30_____ @_2 :40p______, THIS TEST MUST BE DONE BEFORE SURGERY,  COVID TESTING SITE [place] [id number], IT IS ON THE RIGHT GOING OUT [address] APPROXIMATELY  2 MINUTES PAST [employer] ON THE RIGHT. ONCE YOUR COVID TEST IS COMPLETED,  PLEASE BEGIN THE QUARANTINE INSTRUCTIONS AS OUTLINED IN YOUR HANDOUT.                Justin Hall    Your procedure is scheduled on: 01/23/20   Report to [facility] Main  Entrance   Report to admitting at  7:15 AM     Call this number if you have problems the morning of surgery [phone]   . BRUSH YOUR TEETH MORNING OF SURGERY AND RINSE YOUR MOUTH OUT, NO CHEWING GUM CANDY OR MINTS.   No food after midnight.    You may have clear liquid until 6:30 AM.    At 6:30 AM drink pre surgery drink.   Nothing by mouth after  6:30 AM.   Take these medicines the morning of surgery with A SIP OF WATER: Requip, Carvedilol, Allopurinol  DO NOT TAKE ANY DIABETIC MEDICATIONS DAY OF YOUR SURGERY  How to Manage Your Diabetes Before and After Surgery  Why is it important to control my blood sugar before and after surgery? . Improving blood sugar levels before and after surgery helps healing and can limit problems. . A way of improving blood sugar control is eating a healthy diet by: o  Eating less sugar and carbohydrates o  Increasing activity/exercise o  Talking with your doctor about reaching your blood sugar goals . High blood sugars (greater than 180 mg/dL) can raise your risk of infections and slow your recovery, so you will need to focus on controlling your diabetes during the weeks before surgery. . Make sure that the doctor who takes care  of your diabetes knows about your planned surgery including the date and location.  How do I manage my blood sugar before surgery? . Check your blood sugar at least 4 times a day, starting 2 days before surgery, to make sure that the level is not too high or low. o Check your blood sugar the morning of your surgery when you wake up and every 2 hours until you get to the Short Stay unit. . If your blood sugar is less than 70 mg/dL, you will need to treat for low blood sugar: o Do not take insulin. o Treat a low blood sugar (less than 70 mg/dL) with  cup of clear juice (cranberry or apple), 4 glucose tablets, OR glucose gel. o Recheck blood sugar in 15 minutes after treatment (to make sure it is greater than 70 mg/dL). If your blood sugar is not greater than 70 mg/dL on recheck, call [phone] for further instructions. . Report your blood sugar to the short stay nurse when you get to Short Stay.  . If you are admitted to the hospital after surgery: o Your blood sugar will be checked by the staff and you will probably be given insulin after surgery (instead of oral diabetes medicines) to make sure you have good blood sugar levels. o The goal for  blood sugar control after surgery is 80-180 mg/dL.   WHAT DO I DO ABOUT MY DIABETES MEDICATION?  [name] Do not take oral diabetes medicines (pills) the morning of surgery.                                You may not have any metal on your body including               piercings  Do not wear jewelry, lotions, powders or deodorant        .              Men may shave face and neck.   Do not bring valuables to the hospital. [place].  Contacts, dentures or bridgework may not be worn into surgery.       Patients discharged the day of surgery will not be allowed to drive home.   IF YOU ARE HAVING SURGERY AND GOING HOME THE SAME DAY, YOU MUST HAVE AN ADULT TO DRIVE YOU HOME AND BE WITH YOU FOR 24 HOURS.    YOU MAY GO HOME BY TAXI OR UBER OR ORTHERWISE, BUT AN ADULT MUST ACCOMPANY YOU HOME AND STAY WITH YOU FOR 24 HOURS.  Name and phone number of your driver:  Special Instructions: N/A              Please read over the following fact sheets you were given: _____________________________________________________________________             [facility] - Preparing for Surgery  Before surgery, you can play an important role.   Because skin is not sterile, your skin needs to be as free of germs as possible.   You can reduce the number of germs on your skin by washing with CHG (chlorahexidine gluconate) soap before surgery.   CHG is an antiseptic cleaner which kills germs and bonds with the skin to continue killing germs even after washing. Please DO NOT use if you have an allergy to CHG or antibacterial soaps.   If your skin becomes reddened/irritated stop using the CHG and inform your nurse when you arrive at Short Stay. You may shave your face/neck.  Please follow these instructions carefully:  1.  Shower with CHG Soap the night before surgery and the  morning of Surgery.  2.  If you choose to wash your hair, wash your hair first as usual with your  normal  shampoo.  3.  After you shampoo, rinse your hair and body thoroughly to remove the  shampoo.                                        4.  Use CHG as you would any other liquid soap.  You can apply chg directly  to the skin and wash                       Gently with a scrungie or clean washcloth.  5.  Apply the CHG Soap to your body ONLY FROM THE NECK DOWN.   Do not use on face/ open  Wound or open sores. Avoid contact with eyes, ears mouth and genitals (private parts).                       Wash face,  Genitals (private parts) with your normal soap.             6.  Wash thoroughly, paying special attention to the area where your surgery  will be performed.  7.  Thoroughly rinse your body with warm water from the  neck down.  8.  DO NOT shower/wash with your normal soap after using and rinsing off  the CHG Soap.             9.  Pat yourself dry with a clean towel.            10.  Wear clean pajamas.            11.  Place clean sheets on your bed the night of your first shower and do not  sleep with pets. Day of Surgery : Do not apply any lotions/deodorants the morning of surgery.  Please wear clean clothes to the hospital/surgery center.  FAILURE TO FOLLOW THESE INSTRUCTIONS MAY RESULT IN THE CANCELLATION OF YOUR SURGERY PATIENT SIGNATURE_________________________________  NURSE SIGNATURE__________________________________  ________________________________________________________________________   Adam Phenix  An incentive spirometer is a tool that can help keep your lungs clear and active. This tool measures how well you are filling your lungs with each breath. Taking long deep breaths may help reverse or decrease the chance of developing breathing (pulmonary) problems (especially infection) following:  A long period of time when you are unable to move or be active. BEFORE THE PROCEDURE   If the spirometer includes an indicator to show your best effort, your nurse or respiratory therapist will set it to a desired goal.  If possible, sit up straight or lean slightly forward. Try not to slouch.  Hold the incentive spirometer in an upright position. INSTRUCTIONS FOR USE  1. Sit on the edge of your bed if possible, or sit up as far as you can in bed or on a chair. 2. Hold the incentive spirometer in an upright position. 3. Breathe out normally. 4. Place the mouthpiece in your mouth and seal your lips tightly around it. 5. Breathe in slowly and as deeply as possible, raising the piston or the ball toward the top of the column. 6. Hold your breath for 3-5 seconds or for as long as possible. Allow the piston or ball to fall to the bottom of the column. 7. Remove the mouthpiece from your  mouth and breathe out normally. 8. Rest for a few seconds and repeat Steps 1 through 7 at least 10 times every 1-2 hours when you are awake. Take your time and take a few normal breaths between deep breaths. 9. The spirometer may include an indicator to show your best effort. Use the indicator as a goal to work toward during each repetition. 10. After each set of 10 deep breaths, practice coughing to be sure your lungs are clear. If you have an incision (the cut made at the time of surgery), support your incision when coughing by placing a pillow or rolled up towels firmly against it. Once you are able to get out of bed, walk around indoors and cough well. You may stop using the incentive spirometer when instructed by your caregiver.  RISKS AND COMPLICATIONS  Take your time so you do not get dizzy or  light-headed.  If you are in pain, you may need to take or ask for pain medication before doing incentive spirometry. It is harder to take a deep breath if you are having pain. AFTER USE  Rest and breathe slowly and easily.  It can be helpful to keep track of a log of your progress. Your caregiver can provide you with a simple table to help with this. If you are using the spirometer at home, follow these instructions: [place] IF:   You are having difficultly using the spirometer.  You have trouble using the spirometer as often as instructed.  Your pain medication is not giving enough relief while using the spirometer.  You develop fever of 100.5 F (38.1 C) or higher. SEEK IMMEDIATE MEDICAL CARE IF:   You cough up bloody sputum that had not been present before.  You develop fever of 102 F (38.9 C) or greater.  You develop worsening pain at or near the incision site. MAKE SURE YOU:   Understand these instructions.  Will watch your condition.  Will get help right away if you are not doing well or get worse. Document Released: 08/18/2006 Document Revised: 06/30/2011  Document Reviewed: 10/19/2006 [place] Patient Information 2014 [place], [place].   ________________________________________________________________________

## 2020-01-16 ENCOUNTER — Other Ambulatory Visit: Payer: Self-pay

## 2020-01-16 ENCOUNTER — Encounter (HOSPITAL_COMMUNITY)
Admission: RE | Admit: 2020-01-16 | Discharge: 2020-01-16 | Disposition: A | Discharge status: Home / Self Care | Payer: No Typology Code available for payment source | Source: Ambulatory Visit | Attending: Orthopedic Surgery | Admitting: Orthopedic Surgery

## 2020-01-16 DIAGNOSIS — Z01812 Encounter for preprocedural laboratory examination: Secondary | ICD-10-CM | POA: Insufficient documentation

## 2020-01-16 LAB — COMPREHENSIVE METABOLIC PANEL
ALT: 13 U/L (ref 0–44)
AST: 16 U/L (ref 15–41)
Albumin: 3.9 g/dL (ref 3.5–5.0)
Alkaline Phosphatase: 75 U/L (ref 38–126)
Anion gap: 12 (ref 5–15)
BUN: 22 mg/dL — ABNORMAL HIGH (ref 6–20)
CO2: 28 mmol/L (ref 22–32)
Calcium: 9.1 mg/dL (ref 8.9–10.3)
Chloride: 101 mmol/L (ref 98–111)
Creatinine, Ser: 1.25 mg/dL — ABNORMAL HIGH (ref 0.61–1.24)
GFR calc Af Amer: >60 mL/min (ref >60)
GFR calc non Af Amer: >60 mL/min (ref >60)
Glucose, Bld: 133 mg/dL — ABNORMAL HIGH (ref 70–99)
Potassium: 4.5 mmol/L (ref 3.5–5.1)
Sodium: 141 mmol/L (ref 135–145)
Total Bilirubin: 0.4 mg/dL (ref 0.3–1.2)
Total Protein: 7.8 g/dL (ref 6.5–8.1)

## 2020-01-16 LAB — CBC
HCT: 39.5 % (ref 39.0–52.0)
Hemoglobin: 13.1 g/dL (ref 13.0–17.0)
MCH: 27.1 pg (ref 26.0–34.0)
MCHC: 33.2 g/dL (ref 30.0–36.0)
MCV: 81.6 fL (ref 80.0–100.0)
Platelets: 235 10*3/uL (ref 150–400)
RBC: 4.84 MIL/uL (ref 4.22–5.81)
RDW: 14.1 % (ref 11.5–15.5)
WBC: 11 10*3/uL — ABNORMAL HIGH (ref 4.0–10.5)
nRBC: 0 % (ref 0.0–0.2)

## 2020-01-16 LAB — APTT: aPTT: 30 seconds (ref 24–36)

## 2020-01-16 LAB — SURGICAL PCR SCREEN
MRSA, PCR: POSITIVE — AB
Staphylococcus aureus: POSITIVE — AB

## 2020-01-16 LAB — PROTIME-INR
INR: 1 (ref 0.8–1.2)
Prothrombin Time: 12.6 seconds (ref 11.4–15.2)

## 2020-01-16 LAB — GLUCOSE, CAPILLARY: Glucose-Capillary: 127 mg/dL — ABNORMAL HIGH (ref 70–99)
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
## 2020-01-17 ENCOUNTER — Encounter (HOSPITAL_COMMUNITY): Payer: Self-pay

## 2020-01-17 ENCOUNTER — Other Ambulatory Visit: Payer: Self-pay

## 2020-01-17 NOTE — Progress Notes (Signed)
COVID Vaccine Completed:Yes Date COVID Vaccine completed:07/25/19 COVID vaccine manufacturer:  Moderna      PCP - Nolene Ebbs DO Cardiologist - Dr. Kelvin Cellar  Chest x-ray - 02/16/19-Epic EKG - 12/29/19-Epic Stress Test - no ECHO - 10/17/18-Epic Cardiac Cath - 2019-Epic Pacemaker/ICD device last checked:NA  Sleep Study - yes CPAP - yes  Fasting Blood Sugar - 100-110 Checks Blood Sugar _____ times a day. 1/week  Blood Thinner Instructions:NA Aspirin Instructions: Last Dose:  Anesthesia review:   Patient denies shortness of breath, fever, cough and chest pain at PAT appointment Yes   Patient verbalized understanding of instructions that were given to them at the PAT appointment. Patient was also instructed that they will need to review over the PAT instructions again at home before surgery. Yes  Pt is able to climb 3 flights of stairs without SOB. He hasn't had SOB since his CHF has been under control in 2019.
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
## 2020-01-18 NOTE — Anesthesia Preprocedure Evaluation (Addendum)
Anesthesia Evaluation  Patient identified by MRN, date of birth, ID band Patient awake    Reviewed: Allergy & Precautions, NPO status , Patient's Chart, lab work & pertinent test results  History of Anesthesia Complications (+) AWARENESS UNDER ANESTHESIA and history of anesthetic complications  Airway Mallampati: III       Dental no notable dental hx. (+) Teeth Intact   Pulmonary sleep apnea ,    Pulmonary exam normal        Cardiovascular hypertension, + CAD and +CHF  Normal cardiovascular exam Rhythm:Regular Rate:Normal     Neuro/Psych PSYCHIATRIC DISORDERS Anxiety Depression    GI/Hepatic negative GI ROS, Neg liver ROS,   Endo/Other  diabetes, Type 2, Oral Hypoglycemic AgentsMorbid obesity  Renal/GU Renal InsufficiencyRenal disease     Musculoskeletal  (+) Arthritis ,   Abdominal (+) + obese,   Peds  Hematology   Anesthesia Other Findings   1. The left ventricle has low normal systolic function, with an ejection  fraction of 50-55%. The cavity size was normal. There is severely  increased left ventricular wall thickness. Left ventricular diastolic  Doppler parameters are consistent with  pseudonormalization. Left ventricular diffuse hypokinesis.  2. The right ventricle has normal systolic function. The cavity was  normal. There is no increase in right ventricular wall thickness.  3. There is moderate mitral annular calcification present.  4. The inferior vena cava was dilated in size with <50% respiratory  variability.   FINDINGS  Left Ventricle: The left ventricle has low normal systolic function, with  an ejection fraction of 50-55%. The cavity size was normal. There is  severely increased left ventricular wall thickness. Left ventricular  diastolic Doppler parameters are  consistent with pseudonormalization. Left ventricular diffuse hypokinesis.  Definity contrast agent was given IV to delineate  the left ventricular  endocardial borders.   Right Ventricle: The right ventricle has normal systolic function. The  cavity was normal. There is no increase in right ventricular wall  thickness.   Left Atrium: Left atrial size was normal in size.   Right Atrium: Right atrial size was normal in size. Right atrial pressure  is estimated at 10 mmHg.   Interatrial Septum: No atrial level shunt detected by color flow Doppler.   Pericardium: There is no evidence of pericardial effusion. There is a  pericardial fat pad noted.   Mitral Valve: The mitral valve is normal in structure. There is moderate  mitral annular calcification present. Mitral valve regurgitation is not  visualized by color flow Doppler.   Tricuspid Valve: The tricuspid valve is normal in structure. Tricuspid  valve regurgitation was not visualized by color flow Doppler.   Aortic Valve: The aortic valve is normal in structure. Aortic valve  regurgitation was not visualized by color flow Doppler.   Pulmonic Valve: The pulmonic valve was normal in structure. Pulmonic valve  regurgitation is not visualized by color flow Doppler.   Venous: The inferior vena cava is dilated in size with less than 50%  respiratory variability.     +--------------+--------++  LEFT VENTRICLE     +----------------+---------++  +--------------+--------++ Diastology          PLAX 2D         +----------------+---------++  +--------------+--------++ LV e' lateral: 5.00 cm/s  LVIDd:    4.60 cm  +----------------+---------++  +--------------+--------++ LV E/e' lateral:26.8     LVIDs:    3.20 cm  +----------------+---------++  +--------------+--------++ LV e' medial:  4.03 cm/s  LV PW:  2.00 cm  +----------------+---------++  +--------------+--------++ LV E/e' medial: 33.3     LV IVS:    2.10 cm  +----------------+---------++  +--------------+--------++   LVOT diam:  2.00 cm   +--------------+--------++  LV SV:    56 ml    +--------------+--------++  LV SV Index: 19.66    +--------------+--------++  LVOT Area:  3.14 cm  +--------------+--------++               +--------------+--------++   +---------------+----------++  RIGHT VENTRICLE       +---------------+----------++  RV S prime:  13.60 cm/s  +---------------+----------++  TAPSE (M-mode):1.9 cm    +---------------+----------++   +---------------+-------++-----------++  LEFT ATRIUM      Index     +---------------+-------++-----------++  LA diam:    3.90 cm1.48 cm/m   +---------------+-------++-----------++  LA Vol (A2C): 55.3 ml21.00 ml/m  +---------------+-------++-----------++  LA Vol (A4C): 57.4 ml21.79 ml/m  +---------------+-------++-----------++  LA Biplane Vol:59.7 ml22.67 ml/m  +---------------+-------++-----------++  +------------+----------++-----------++  RIGHT ATRIUM     Index     +------------+----------++-----------++  RA Pressure:15.00 mmHg        +------------+----------++-----------++  RA Area:  18.40 cm         +------------+----------++-----------++  RA Volume: 49.40 ml 18.76 ml/m  +------------+----------++-----------++  +------------------+-----------++  AORTIC VALVE           +------------------+-----------++  AV Area (Vmax):  2.93 cm    +------------------+-----------++  AV Area (Vmean): 2.77 cm    +------------------+-----------++  AV Area (VTI):  3.08 cm    +------------------+-----------++  AV Vmax:     106.00 cm/s  +------------------+-----------++  AV Vmean:     73.500 cm/s  +------------------+-----------++  AV VTI:      0.184 m    +------------------+-----------++  AV Peak Grad:   4.5 mmHg     +------------------+-----------++  AV Mean Grad:   2.3 mmHg    +------------------+-----------++  LVOT Vmax:    98.87 cm/s   +------------------+-----------++  LVOT Vmean:    64.767 cm/s  +------------------+-----------++  LVOT VTI:     0.181 m    +------------------+-----------++  LVOT/AV VTI ratio:0.98      +------------------+-----------++    +-------------+-------++  AORTA          +-------------+-------++  Ao Root diam:3.40 cm  +-------------+-------++   +--------------+----------++ +---------------+----------++  MITRAL VALVE       TRICUSPID VALVE       +--------------+----------++ +---------------+----------++  MV Area (PHT):3.68 cm  Estimated RAP: 15.00 mmHg  +--------------+----------++ +---------------+----------++  MV Peak grad: 8.5 mmHg   +--------------+----------++ +--------------+-------+  MV Mean grad: 3.0 mmHg  SHUNTS          +--------------+----------++ +--------------+-------+  MV Vmax:   1.46 m/s  Systemic VTI: 0.18 m   +--------------+----------++ +--------------+-------+  MV Vmean:   82.8 cm/s  Systemic Diam:2.00 cm  +--------------+----------++ +--------------+-------+  MV VTI:    0.32 m    +--------------+----------++  MV PHT:    59.74 msec  +--------------+----------++  MV Decel Time:206 msec   +--------------+----------++  +--------------+-----------++  MV E velocity:134.00 cm/s  +--------------+-----------++  MV A velocity:30.30 cm/s   +--------------+-----------++  MV E/A ratio: 4.42      +--------------+-----------++     Candee Furbish MD  Electronically signed by Candee Furbish MD  Signature Date/Time: 10/18/2018/11:42:48 AM      Final  Imaging Info  ECHOCARDIOGRAM COMPLETE (Order #[id number]) on 10/17/18 Study History  ECHOCARDIOGRAM COMPLETE (Order #[id number])  on 10/17/18 Syngo Images  Show images for ECHOCARDIOGRAM COMPLETE Images on Long  Term Storage  Show images for Isom, Kochan "[name]" Performing Technologist/Nurse  Performing Technologist/Nurse: Marybelle Killings, RT Reason for Exam Priority: Anticipated Discharge Not on file Patient Data  Height  70 in  BP  187/99 mmHg    Surgical History  Surgical History   No past medical history on file.  Other Surgical History   Procedure Laterality Date Comment Source ANTERIOR CRUCIATE LIGAMENT REPAIR Left 1983   HERNIA REPAIR     HUMERUS SURGERY Right 1985   LEFT HEART CATH AND CORONARY ANGIOGRAPHY N/A 06/02/2017 Procedure: LEFT HEART CATH AND CORONARY ANGIOGRAPHY; Surgeon: Burnell Blanks, MD; Location: [facility]; Service: Cardiovascular; Laterality: N/A;  TONSILLECTOMY  as child   UMBILICAL HERNIA REPAIR N/A 02/27/2014 Procedure: LAPAROSCOPIC UMBILICAL HERNIA REPAIR WITH MESH; Surgeon: Rolm Bookbinder, MD; Location: [facility]; Service: General; Laterality: N/A;   Implants   No active implants to display in this view. Encounter-Level Documents - 10/17/2018:  Document on 10/21/2018 12:59 PM by Abner Greenspan, RN: IP After Visit Summary Document on 10/21/2018 12:08 PM by Darnelle Maffucci: ED PB Billing Extract Document on 10/21/2018 11:23 AM by Eldridge Dace, RN: IP After Visit Summary Scan on 10/20/2018 9:51 PM by Default, Provider, MD Scan on 10/20/2018 11:46 PM by Default, Provider, MD Scan on 10/17/2018 7:20 AM by Belva Chimes Documents:  There are no order-level documents. Resulted by:  Signed Date/Time  Phone Pager Candee Furbish C 10/18/2018 11:42 AM [phone]  External Result Report  External Result Report ECHOCARDIOGRAM COMPLETE: Patient Communication  Add Comments Add Notifications   Prox Cx to Mid Cx lesion is 20% stenosed.  Ost LAD to Prox LAD lesion is 20% stenosed.  Prox LAD lesion is 30% stenosed.  Mid LAD  to Dist LAD lesion is 20% stenosed.  Ost 1st Diag lesion is 30% stenosed.  Ost 2nd Diag lesion is 40% stenosed.  The left ventricular systolic function is normal.  LV end diastolic pressure is normal.  The left ventricular ejection fraction is 55-65% by visual estimate.  There is no mitral valve regurgitation.   1. Non-obstructive CAD noted 2. Normal LV systolic function  Recommendations: Medical management of CAD. Aggressive risk factor reduction.   Indications  Precordial chest pain (R07.2 (ICD-10-CM)) Procedural Details  Technical Details Indication: 54 yo male with history of morbid obesity, DM, sleep apnea admitted with chest pain. Negative troponin. CAD noted on CT scan.   Procedure: The risks, benefits, complications, treatment options, and expected outcomes were discussed with the patient. The patient and/or family concurred with the proposed plan, giving informed consent. The patient was sedated with Versed and Fentanyl. The right wrist was prepped and draped in a sterile fashion. 1% lidocaine was used for local anesthesia. Using the modified Seldinger access technique, a 5 French sheath was placed in the right radial artery. 3 mg Verapamil was given through the sheath. 6000 units IV heparin was given. Standard diagnostic catheters were used to perform selective coronary angiography. A pigtail catheter was used to perform a left ventricular angiogram. The sheath was removed from the right radial artery and a Terumo hemostasis band was applied at the arteriotomy site on the right wrist.      Estimated blood loss <50 mL.  During this procedure the patient was administered the following to achieve and maintain moderate conscious sedation: Versed 1 mg, Fentanyl 25 mcg,  while the patient's heart rate, blood pressure, and oxygen saturation were continuously monitored. The period of conscious  sedation was 29 minutes, of which I was present face-to-face 100% of this  time. Medications (Filter: Administrations occurring from 1602 to 1645 on 06/02/17) (important) Continuous medications are totaled by the amount administered until 06/02/17 1645. midazolam (VERSED) injection (mg) Total dose:  1 mg Date/Time  Rate/Dose/Volume Action 06/02/17 1611  1 mg Given  fentaNYL (SUBLIMAZE) injection (mcg) Total dose:  25 mcg Date/Time  Rate/Dose/Volume Action 06/02/17 1612  25 mcg Given  lidocaine (PF) (XYLOCAINE) 1 % injection (mL) Total volume:  2 mL Date/Time  Rate/Dose/Volume Action 06/02/17 1619  2 mL Given  Radial Cocktail/Verapamil only (mL) Total dose:  Cannot be calculated* *Administration dose not documented Date/Time  Rate/Dose/Volume Action 06/02/17 1620   Given  heparin injection (Units) Total dose:  6,000 Units Date/Time  Rate/Dose/Volume Action 06/02/17 1623  6,000 Units Given  heparin infusion 2 units/mL in 0.9 % sodium chloride (mL) Total dose:  Cannot be calculated* *Continuous medication not stopped within the calculation time range. Date/Time  Rate/Dose/Volume Action 06/02/17 1623  1,000 mL New Bag/Given  acetaminophen (TYLENOL) tablet 650 mg (mg) Total dose:  Cannot be calculated* *Administration dose not documented Date/Time  Rate/Dose/Volume Action 06/02/17 1602  *Not included in total MAR Hold  allopurinol (ZYLOPRIM) tablet 300 mg (mg) Total dose:  Cannot be calculated* *Administration dose not documented Date/Time  Rate/Dose/Volume Action 06/02/17 1602  *Not included in total MAR Hold  aspirin EC tablet 81 mg (mg) Total dose:  Cannot be calculated* *Administration dose not documented Date/Time  Rate/Dose/Volume Action 06/02/17 1602  *Not included in total MAR Hold  atorvastatin (LIPITOR) tablet 80 mg (mg) Total dose:  Cannot be calculated* Dosing weight:  146.6 *Administration dose not documented Date/Time  Rate/Dose/Volume Action 06/02/17 1602  *Not included in total MAR Hold  carvedilol (COREG) tablet 6.25  mg (mg) Total dose:  Cannot be calculated* Dosing weight:  145.8 *Administration dose not documented Date/Time  Rate/Dose/Volume Action 06/02/17 1602  *Not included in total MAR Hold  Chlorhexidine Gluconate Cloth 2 % PADS 6 each (each) Total dose:  Cannot be calculated* Dosing weight:  146.6 *Administration dose not documented Date/Time  Rate/Dose/Volume Action 06/02/17 1602  *Not included in total MAR Hold  clonazePAM (KLONOPIN) tablet 2 mg (mg) Total dose:  Cannot be calculated* *Administration dose not documented Date/Time  Rate/Dose/Volume Action 06/02/17 1602  *Not included in total MAR Hold  insulin aspart (novoLOG) injection 0-15 Units (Units) Total dose:  Cannot be calculated* Dosing weight:  145.8 *Administration dose not documented Date/Time  Rate/Dose/Volume Action 06/02/17 1602  *Not included in total MAR Hold  insulin aspart (novoLOG) injection 0-5 Units (Units) Total dose:  Cannot be calculated* Dosing weight:  145.8 *Administration dose not documented Date/Time  Rate/Dose/Volume Action 06/02/17 1602  *Not included in total MAR Hold  morphine 2 MG/ML injection 2-4 mg (mg) Total dose:  Cannot be calculated* *Administration dose not documented Date/Time  Rate/Dose/Volume Action 06/02/17 1602  *Not included in total MAR Hold  mupirocin ointment (BACTROBAN) 2 % 1 application (application) Total dose:  Cannot be calculated* Dosing weight:  146.6 *Administration dose not documented Date/Time  Rate/Dose/Volume Action 06/02/17 1602  *Not included in total MAR Hold  ondansetron (ZOFRAN) injection 4 mg (mg) Total dose:  Cannot be calculated* *Administration dose not documented Date/Time  Rate/Dose/Volume Action 06/02/17 1602  *Not included in total MAR Hold  rOPINIRole (REQUIP) tablet 4 mg (mg) Total dose:  Cannot be calculated* *Administration dose not documented Date/Time  Rate/Dose/Volume Action 06/02/17 1602  *Not included in total  MAR Hold  rOPINIRole  (REQUIP) tablet 4 mg (mg) Total dose:  Cannot be calculated* Dosing weight:  146.6 *Administration dose not documented Date/Time  Rate/Dose/Volume Action 06/02/17 1602  *Not included in total MAR Hold  Sedation Time  Sedation Time Physician-1: 28 minutes 31 seconds Radiation/Fluoro  Fluoro time: 6.6 (min) DAP: 54.3 (Gycm2) Cumulative Air Kerma: 643.3 (mGy) Complications  Complications documented before study signed (06/03/2017 6:28 AM)  LEFT HEART CATH AND CORONARY ANGIOGRAPHY   None Documented by Burnell Blanks, MD 06/02/2017 4:55 PM Time Range: Intraprocedure   Coronary Findings  Diagnostic Dominance: Left Left Anterior Descending Vessel is large. Ost LAD to Prox LAD lesion is 20% stenosed. Prox LAD lesion is 30% stenosed. Mid LAD to Dist LAD lesion is 20% stenosed. First Diagonal Branch Vessel is small in size. Ost 1st Diag lesion is 30% stenosed. Second Diagonal Branch Vessel is moderate in size. Ost 2nd Diag lesion is 40% stenosed. Left Circumflex Prox Cx to Mid Cx lesion is 20% stenosed. First Obtuse Marginal Branch Vessel is moderate in size. Second Obtuse Marginal Branch Vessel is moderate in size. Left Posterior Descending Artery Vessel is moderate in size. First Left Posterolateral Branch Vessel is small in size. Right Coronary Artery Vessel is moderate in size. Intervention  No interventions have been documented. Wall Motion        Left Heart  Left Ventricle The left ventricular size is normal. The left ventricular systolic function is normal. LV end diastolic pressure is normal. The left ventricular ejection fraction is 55-65% by visual estimate. No regional wall motion abnormalities. There is no evidence of mitral regurgitation. Coronary Diagrams  Diagnostic Dominance: Left  Intervention  Implants   No implant documentation for this case. Syngo Images  Show images for CARDIAC CATHETERIZATION Images on Long Term  Storage  Show images for Jakeb, Lamping "Marya Amsler" Link to Procedure Log   Procedure Log  Hemo Data   Most Recent Value AO Systolic Pressure 295 mmHg AO Diastolic Pressure 188 mmHg AO Mean 416 mmHg LV Systolic Pressure 606 mmHg LV Diastolic Pressure 23 mmHg LV EDP 28 mmHg AOp Systolic Pressure 301 mmHg AOp Diastolic Pressure 601 mmHg AOp Mean Pressure 093 mmHg LVp Systolic Pressure 235 mmHg LVp Diastolic Pressure 11 mmHg LVp EDP Pressure 25 mmHg Encounter-Level Documents - 05/31/2017:  Document on 06/05/2017 5:13 AM by Willodean Rosenthal, RN: ED PB Billing Extract Scan on 06/04/2017 2:25 PM by Default, Provider, MD Scan on 06/04/2017 1:35 PM by Default, Provider, MD Document on 06/03/2017 2:46 PM by Myra Gianotti, RN: IP After Visit Summary Scan on 06/02/2017 4:50 PM by Default, Provider, MD Scan on 06/01/2017 10:44 AM by Default, Provider, MD Scan on 06/03/2017 9:03 AM by Default, Provider, MD Scan on 06/01/2017 7:11 PM by Default, Provider, MD Electronic signature on 06/01/2017 4:42 AM - 1 of 7 e-signatures recorded Electronic signature on 05/31/2017 11:36 PM - E-signed   Order-Level Documents - 05/31/2017:  Scan on 06/01/2017 3:56 PM by Default, Provider, MD   Signed  Electronically signed by Burnell Blanks, MD on 06/02/17 at 1656 EST External Result Report  External Result Report   Prox Cx to Mid Cx lesion is 20% stenosed.  Ost LAD to Prox LAD lesion is 20% stenosed.  Prox LAD lesion is 30% stenosed.  Mid LAD to Dist LAD lesion is 20% stenosed.  Ost 1st Diag lesion is 30% stenosed.  Ost 2nd Diag lesion is 40% stenosed.  The left ventricular systolic function is normal.  LV end diastolic pressure is normal.  The left ventricular ejection fraction is 55-65% by visual estimate.  There is no mitral valve regurgitation.   1. Non-obstructive CAD noted 2. Normal LV systolic function  Recommendations: Medical management of CAD. Aggressive risk  factor reduction.     Reproductive/Obstetrics                          Anesthesia Physical Anesthesia Plan  ASA: III  Anesthesia Plan: Spinal   Post-op Pain Management:    Induction:   PONV Risk Score and Plan: Propofol infusion, Ondansetron and Dexamethasone  Airway Management Planned: Natural Airway and Simple Face Mask  Additional Equipment: None  Intra-op Plan:   Post-operative Plan:   Informed Consent: I have reviewed the patients History and Physical, chart, labs and discussed the procedure including the risks, benefits and alternatives for the proposed anesthesia with the patient or authorized representative who has indicated his/her understanding and acceptance.       Plan Discussed with: CRNA  Anesthesia Plan Comments: (See PAT note 01/16/2020, Konrad Felix, PA-C)       Anesthesia Quick Evaluation

## 2020-01-19 ENCOUNTER — Inpatient Hospital Stay (HOSPITAL_COMMUNITY): Admission: RE | Admit: 2020-01-19 | Payer: No Typology Code available for payment source | Source: Ambulatory Visit

## 2020-01-20 ENCOUNTER — Other Ambulatory Visit (HOSPITAL_COMMUNITY)
Admission: RE | Admit: 2020-01-20 | Discharge: 2020-01-20 | Disposition: A | Discharge status: Home / Self Care | Payer: No Typology Code available for payment source | Source: Ambulatory Visit | Attending: Orthopedic Surgery | Admitting: Orthopedic Surgery

## 2020-01-20 DIAGNOSIS — Z01812 Encounter for preprocedural laboratory examination: Secondary | ICD-10-CM | POA: Diagnosis present

## 2020-01-20 DIAGNOSIS — Z20822 Contact with and (suspected) exposure to covid-19: Secondary | ICD-10-CM | POA: Insufficient documentation

## 2020-01-20 LAB — SARS CORONAVIRUS 2 (TAT 6-24 HRS): SARS Coronavirus 2: NEGATIVE

## 2020-01-20 NOTE — Progress Notes (Signed)
Pt's wife advised that pt's surgery on 01/23/20 is now scheduled for 8:30 AM. Pt to arrive to [facility] Admitting at 6:00 AM..Marland KitchenPt's wife verbalized understanding

## 2020-01-22 MED ORDER — VANCOMYCIN HCL 1500 MG/300ML IV SOLN
1500.0000 mg | INTRAVENOUS | Status: AC
  Administered 2020-01-23: 1000 mg via INTRAVENOUS
  Filled 2020-01-22: qty 300

## 2020-01-22 MED ORDER — DEXTROSE 5 % IV SOLN
3.0000 g | INTRAVENOUS | Status: AC
  Administered 2020-01-23: 2 g via INTRAVENOUS
  Filled 2020-01-22: qty 3

## 2020-01-23 ENCOUNTER — Encounter (HOSPITAL_COMMUNITY): Payer: Self-pay | Admitting: Orthopedic Surgery

## 2020-01-23 ENCOUNTER — Observation Stay (HOSPITAL_COMMUNITY)
Admission: RE | Admit: 2020-01-23 | Discharge: 2020-01-24 | Disposition: A | Discharge status: Home / Self Care | Payer: No Typology Code available for payment source | Source: Ambulatory Visit | Attending: Orthopedic Surgery | Admitting: Orthopedic Surgery

## 2020-01-23 ENCOUNTER — Ambulatory Visit (HOSPITAL_COMMUNITY): Payer: No Typology Code available for payment source

## 2020-01-23 ENCOUNTER — Ambulatory Visit (HOSPITAL_COMMUNITY): Payer: No Typology Code available for payment source | Admitting: Certified Registered Nurse Anesthetist

## 2020-01-23 ENCOUNTER — Ambulatory Visit (HOSPITAL_COMMUNITY): Payer: No Typology Code available for payment source | Admitting: Physician Assistant

## 2020-01-23 ENCOUNTER — Other Ambulatory Visit: Payer: Self-pay

## 2020-01-23 ENCOUNTER — Encounter (HOSPITAL_COMMUNITY)
Admission: RE | Disposition: A | Discharge status: Home / Self Care | Payer: Self-pay | Source: Ambulatory Visit | Attending: Orthopedic Surgery

## 2020-01-23 DIAGNOSIS — E1165 Type 2 diabetes mellitus with hyperglycemia: Secondary | ICD-10-CM | POA: Diagnosis not present

## 2020-01-23 DIAGNOSIS — M1612 Unilateral primary osteoarthritis, left hip: Principal | ICD-10-CM | POA: Insufficient documentation

## 2020-01-23 DIAGNOSIS — Z96649 Presence of unspecified artificial hip joint: Secondary | ICD-10-CM

## 2020-01-23 DIAGNOSIS — I509 Heart failure, unspecified: Secondary | ICD-10-CM | POA: Diagnosis not present

## 2020-01-23 DIAGNOSIS — Z7984 Long term (current) use of oral hypoglycemic drugs: Secondary | ICD-10-CM | POA: Diagnosis not present

## 2020-01-23 DIAGNOSIS — I2510 Atherosclerotic heart disease of native coronary artery without angina pectoris: Secondary | ICD-10-CM | POA: Insufficient documentation

## 2020-01-23 DIAGNOSIS — I110 Hypertensive heart disease with heart failure: Secondary | ICD-10-CM | POA: Insufficient documentation

## 2020-01-23 DIAGNOSIS — M109 Gout, unspecified: Secondary | ICD-10-CM | POA: Insufficient documentation

## 2020-01-23 DIAGNOSIS — M169 Osteoarthritis of hip, unspecified: Secondary | ICD-10-CM | POA: Diagnosis present

## 2020-01-23 DIAGNOSIS — Z79899 Other long term (current) drug therapy: Secondary | ICD-10-CM | POA: Insufficient documentation

## 2020-01-23 HISTORY — PX: TOTAL HIP ARTHROPLASTY: SHX124

## 2020-01-23 LAB — GLUCOSE, CAPILLARY
Glucose-Capillary: 132 mg/dL — ABNORMAL HIGH (ref 70–99)
Glucose-Capillary: 118 mg/dL — ABNORMAL HIGH (ref 70–99)
Glucose-Capillary: 230 mg/dL — ABNORMAL HIGH (ref 70–99)
Glucose-Capillary: 211 mg/dL — ABNORMAL HIGH (ref 70–99)

## 2020-01-23 LAB — TYPE AND SCREEN
ABO/RH(D): B NEG
Antibody Screen: NEGATIVE

## 2020-01-23 LAB — ABO/RH: ABO/RH(D): B NEG

## 2020-01-23 SURGERY — ARTHROPLASTY, HIP, TOTAL,POSTERIOR APPROACH
Anesthesia: Spinal | Site: Hip | Laterality: Left

## 2020-01-23 MED ORDER — ONDANSETRON HCL 4 MG/2ML IJ SOLN: 4.0000 mg | Freq: Four times a day (QID) | INTRAMUSCULAR | Status: DC | PRN

## 2020-01-23 MED ORDER — ONDANSETRON HCL 4 MG PO TABS: 4.0000 mg | ORAL_TABLET | Freq: Four times a day (QID) | ORAL | Status: DC | PRN

## 2020-01-23 MED ORDER — MENTHOL 3 MG MT LOZG: 1.0000 | LOZENGE | OROMUCOSAL | Status: DC | PRN

## 2020-01-23 MED ORDER — KETOROLAC TROMETHAMINE 30 MG/ML IJ SOLN: 30.0000 mg | Freq: Once | INTRAMUSCULAR | Status: DC | PRN

## 2020-01-23 MED ORDER — LABETALOL HCL 5 MG/ML IV SOLN
10.0000 mg | INTRAVENOUS | Status: DC | PRN
  Administered 2020-01-23 – 2020-01-24 (×2): 10 mg via INTRAVENOUS
  Filled 2020-01-23 (×2): qty 4

## 2020-01-23 MED ORDER — KETOROLAC TROMETHAMINE 30 MG/ML IJ SOLN
INTRAMUSCULAR | Status: AC
  Filled 2020-01-23: qty 1

## 2020-01-23 MED ORDER — CEFAZOLIN SODIUM-DEXTROSE 2-4 GM/100ML-% IV SOLN
2.0000 g | Freq: Four times a day (QID) | INTRAVENOUS | Status: AC
  Administered 2020-01-23 (×2): 2 g via INTRAVENOUS
  Filled 2020-01-23 (×2): qty 100

## 2020-01-23 MED ORDER — CARVEDILOL 6.25 MG PO TABS
6.2500 mg | ORAL_TABLET | Freq: Two times a day (BID) | ORAL | Status: DC
  Administered 2020-01-23 – 2020-01-24 (×2): 6.25 mg via ORAL
  Filled 2020-01-23 (×2): qty 1

## 2020-01-23 MED ORDER — PHENYLEPHRINE HCL-NACL 10-0.9 MG/250ML-% IV SOLN
INTRAVENOUS | Status: DC | PRN
  Administered 2020-01-23: 75 ug/min via INTRAVENOUS

## 2020-01-23 MED ORDER — DEXAMETHASONE SODIUM PHOSPHATE 10 MG/ML IJ SOLN
10.0000 mg | Freq: Once | INTRAMUSCULAR | Status: AC
  Administered 2020-01-24: 10 mg via INTRAVENOUS
  Filled 2020-01-23: qty 1

## 2020-01-23 MED ORDER — INSULIN ASPART 100 UNIT/ML ~~LOC~~ SOLN
0.0000 [IU] | Freq: Three times a day (TID) | SUBCUTANEOUS | Status: DC
  Administered 2020-01-23 – 2020-01-24 (×2): 5 [IU] via SUBCUTANEOUS
  Administered 2020-01-24: 3 [IU] via SUBCUTANEOUS

## 2020-01-23 MED ORDER — PHENYLEPHRINE HCL (PRESSORS) 10 MG/ML IV SOLN
INTRAVENOUS | Status: AC
  Filled 2020-01-23: qty 1

## 2020-01-23 MED ORDER — BISACODYL 10 MG RE SUPP: 10.0000 mg | Freq: Every day | RECTAL | Status: DC | PRN

## 2020-01-23 MED ORDER — DEXMEDETOMIDINE (PRECEDEX) IN NS 20 MCG/5ML (4 MCG/ML) IV SYRINGE
PREFILLED_SYRINGE | INTRAVENOUS | Status: AC
  Filled 2020-01-23: qty 5

## 2020-01-23 MED ORDER — DIAZEPAM 5 MG PO TABS: 5.0000 mg | ORAL_TABLET | Freq: Two times a day (BID) | ORAL | Status: DC | PRN

## 2020-01-23 MED ORDER — FENTANYL CITRATE (PF) 100 MCG/2ML IJ SOLN
INTRAMUSCULAR | Status: AC
  Filled 2020-01-23: qty 2

## 2020-01-23 MED ORDER — ONDANSETRON HCL 4 MG/2ML IJ SOLN
INTRAMUSCULAR | Status: AC
  Filled 2020-01-23: qty 2

## 2020-01-23 MED ORDER — HYDROMORPHONE HCL 1 MG/ML IJ SOLN
0.5000 mg | INTRAMUSCULAR | Status: DC | PRN
  Administered 2020-01-23 (×3): 1 mg via INTRAVENOUS
  Filled 2020-01-23 (×3): qty 1

## 2020-01-23 MED ORDER — FLEET ENEMA 7-19 GM/118ML RE ENEM: 1.0000 | ENEMA | Freq: Once | RECTAL | Status: DC | PRN

## 2020-01-23 MED ORDER — EPHEDRINE SULFATE-NACL 50-0.9 MG/10ML-% IV SOSY
PREFILLED_SYRINGE | INTRAVENOUS | Status: DC | PRN
  Administered 2020-01-23: 10 mg via INTRAVENOUS
  Administered 2020-01-23: 5 mg via INTRAVENOUS

## 2020-01-23 MED ORDER — EPHEDRINE 5 MG/ML INJ
INTRAVENOUS | Status: AC
  Filled 2020-01-23: qty 10

## 2020-01-23 MED ORDER — ORAL CARE MOUTH RINSE: 15.0000 mL | Freq: Once | OROMUCOSAL | Status: AC

## 2020-01-23 MED ORDER — LACTATED RINGERS IV SOLN: INTRAVENOUS | Status: DC

## 2020-01-23 MED ORDER — HYDROMORPHONE HCL 1 MG/ML IJ SOLN
INTRAMUSCULAR | Status: AC
  Administered 2020-01-23: 0.25 mg via INTRAVENOUS
  Filled 2020-01-23: qty 2

## 2020-01-23 MED ORDER — DEXAMETHASONE SODIUM PHOSPHATE 10 MG/ML IJ SOLN
8.0000 mg | Freq: Once | INTRAMUSCULAR | Status: AC
  Administered 2020-01-23: 10 mg via INTRAVENOUS

## 2020-01-23 MED ORDER — ROPINIROLE HCL 1 MG PO TABS: 12.0000 mg | ORAL_TABLET | Freq: Every day | ORAL | Status: DC

## 2020-01-23 MED ORDER — TRANEXAMIC ACID-NACL 1000-0.7 MG/100ML-% IV SOLN
1000.0000 mg | INTRAVENOUS | Status: AC
  Administered 2020-01-23: 1000 mg via INTRAVENOUS
  Filled 2020-01-23: qty 100

## 2020-01-23 MED ORDER — ALLOPURINOL 300 MG PO TABS
300.0000 mg | ORAL_TABLET | Freq: Two times a day (BID) | ORAL | Status: DC
  Administered 2020-01-23 – 2020-01-24 (×2): 300 mg via ORAL
  Filled 2020-01-23 (×2): qty 1

## 2020-01-23 MED ORDER — ACETAMINOPHEN 10 MG/ML IV SOLN
1000.0000 mg | Freq: Four times a day (QID) | INTRAVENOUS | Status: DC
  Administered 2020-01-23: 1000 mg via INTRAVENOUS
  Filled 2020-01-23: qty 100

## 2020-01-23 MED ORDER — PROMETHAZINE HCL 25 MG/ML IJ SOLN: 6.2500 mg | INTRAMUSCULAR | Status: DC | PRN

## 2020-01-23 MED ORDER — METOCLOPRAMIDE HCL 5 MG PO TABS: 5.0000 mg | ORAL_TABLET | Freq: Three times a day (TID) | ORAL | Status: DC | PRN

## 2020-01-23 MED ORDER — DOCUSATE SODIUM 100 MG PO CAPS
100.0000 mg | ORAL_CAPSULE | Freq: Two times a day (BID) | ORAL | Status: DC
  Administered 2020-01-23 – 2020-01-24 (×2): 100 mg via ORAL
  Filled 2020-01-23 (×2): qty 1

## 2020-01-23 MED ORDER — MIDAZOLAM HCL 2 MG/2ML IJ SOLN
INTRAMUSCULAR | Status: AC
  Filled 2020-01-23: qty 2

## 2020-01-23 MED ORDER — ASPIRIN EC 325 MG PO TBEC
325.0000 mg | DELAYED_RELEASE_TABLET | Freq: Two times a day (BID) | ORAL | Status: DC
  Administered 2020-01-24: 325 mg via ORAL
  Filled 2020-01-23: qty 1

## 2020-01-23 MED ORDER — FUROSEMIDE 40 MG PO TABS
40.0000 mg | ORAL_TABLET | Freq: Two times a day (BID) | ORAL | Status: DC
  Administered 2020-01-24: 40 mg via ORAL
  Filled 2020-01-23: qty 1

## 2020-01-23 MED ORDER — MIDAZOLAM HCL 5 MG/5ML IJ SOLN
INTRAMUSCULAR | Status: DC | PRN
  Administered 2020-01-23 (×5): 1 mg via INTRAVENOUS

## 2020-01-23 MED ORDER — PHENYLEPHRINE 40 MCG/ML (10ML) SYRINGE FOR IV PUSH (FOR BLOOD PRESSURE SUPPORT)
PREFILLED_SYRINGE | INTRAVENOUS | Status: AC
  Filled 2020-01-23: qty 10

## 2020-01-23 MED ORDER — CHLORHEXIDINE GLUCONATE 0.12 % MT SOLN
15.0000 mL | Freq: Once | OROMUCOSAL | Status: AC
  Administered 2020-01-23: 15 mL via OROMUCOSAL

## 2020-01-23 MED ORDER — SODIUM CHLORIDE 0.9 % IV SOLN: INTRAVENOUS | Status: DC

## 2020-01-23 MED ORDER — ATORVASTATIN CALCIUM 40 MG PO TABS
80.0000 mg | ORAL_TABLET | Freq: Every evening | ORAL | Status: DC
  Administered 2020-01-23: 80 mg via ORAL
  Filled 2020-01-23: qty 2

## 2020-01-23 MED ORDER — OXYCODONE HCL 5 MG PO TABS: 5.0000 mg | ORAL_TABLET | ORAL | Status: DC | PRN

## 2020-01-23 MED ORDER — 0.9 % SODIUM CHLORIDE (POUR BTL) OPTIME
TOPICAL | Status: DC | PRN
  Administered 2020-01-23: 1000 mL

## 2020-01-23 MED ORDER — ACETAMINOPHEN 500 MG PO TABS
1000.0000 mg | ORAL_TABLET | Freq: Four times a day (QID) | ORAL | Status: AC
  Administered 2020-01-23 – 2020-01-24 (×4): 1000 mg via ORAL
  Filled 2020-01-23 (×4): qty 2

## 2020-01-23 MED ORDER — ONDANSETRON HCL 4 MG/2ML IJ SOLN
INTRAMUSCULAR | Status: DC | PRN
  Administered 2020-01-23: 4 mg via INTRAVENOUS

## 2020-01-23 MED ORDER — STERILE WATER FOR IRRIGATION IR SOLN
Status: DC | PRN
  Administered 2020-01-23: 2000 mL

## 2020-01-23 MED ORDER — PHENOL 1.4 % MT LIQD: 1.0000 | OROMUCOSAL | Status: DC | PRN

## 2020-01-23 MED ORDER — DIPHENHYDRAMINE HCL 12.5 MG/5ML PO ELIX: 12.5000 mg | ORAL_SOLUTION | ORAL | Status: DC | PRN

## 2020-01-23 MED ORDER — FENTANYL CITRATE (PF) 100 MCG/2ML IJ SOLN
INTRAMUSCULAR | Status: DC | PRN
Start: 2020-01-23 — End: 2020-01-23
  Administered 2020-01-23 (×4): 50 ug via INTRAVENOUS

## 2020-01-23 MED ORDER — ROPINIROLE HCL 1 MG PO TABS: 4.0000 mg | ORAL_TABLET | Freq: Every day | ORAL | Status: DC | PRN

## 2020-01-23 MED ORDER — DEXAMETHASONE SODIUM PHOSPHATE 10 MG/ML IJ SOLN
INTRAMUSCULAR | Status: AC
  Filled 2020-01-23: qty 1

## 2020-01-23 MED ORDER — LABETALOL HCL 5 MG/ML IV SOLN
20.0000 mg | Freq: Once | INTRAVENOUS | Status: AC
  Administered 2020-01-23: 20 mg via INTRAVENOUS
  Filled 2020-01-23: qty 4

## 2020-01-23 MED ORDER — OXYCODONE HCL 5 MG PO TABS
ORAL_TABLET | ORAL | Status: AC
Start: 2020-01-23 — End: 2020-01-23
  Filled 2020-01-23: qty 2

## 2020-01-23 MED ORDER — METHOCARBAMOL 500 MG PO TABS
500.0000 mg | ORAL_TABLET | Freq: Four times a day (QID) | ORAL | Status: DC | PRN
  Administered 2020-01-23 – 2020-01-24 (×4): 500 mg via ORAL
  Filled 2020-01-23 (×4): qty 1

## 2020-01-23 MED ORDER — OXYCODONE HCL 5 MG PO TABS
10.0000 mg | ORAL_TABLET | ORAL | Status: DC | PRN
  Administered 2020-01-23: 15 mg via ORAL
  Administered 2020-01-23: 10 mg via ORAL
  Administered 2020-01-23 – 2020-01-24 (×4): 15 mg via ORAL
  Filled 2020-01-23 (×5): qty 3

## 2020-01-23 MED ORDER — LISINOPRIL 20 MG PO TABS
20.0000 mg | ORAL_TABLET | Freq: Every day | ORAL | Status: DC
  Administered 2020-01-23 – 2020-01-24 (×2): 20 mg via ORAL
  Filled 2020-01-23 (×2): qty 1

## 2020-01-23 MED ORDER — POTASSIUM CHLORIDE ER 10 MEQ PO TBCR
20.0000 meq | EXTENDED_RELEASE_TABLET | Freq: Every day | ORAL | Status: DC
  Administered 2020-01-23 – 2020-01-24 (×2): 20 meq via ORAL
  Filled 2020-01-23 (×4): qty 2

## 2020-01-23 MED ORDER — GLYCOPYRROLATE PF 0.2 MG/ML IJ SOSY
PREFILLED_SYRINGE | INTRAMUSCULAR | Status: AC
  Filled 2020-01-23: qty 1

## 2020-01-23 MED ORDER — METHOCARBAMOL 500 MG IVPB - SIMPLE MED
500.0000 mg | Freq: Four times a day (QID) | INTRAVENOUS | Status: DC | PRN
  Filled 2020-01-23: qty 50

## 2020-01-23 MED ORDER — POLYETHYLENE GLYCOL 3350 17 G PO PACK: 17.0000 g | PACK | Freq: Every day | ORAL | Status: DC | PRN

## 2020-01-23 MED ORDER — PHENYLEPHRINE 40 MCG/ML (10ML) SYRINGE FOR IV PUSH (FOR BLOOD PRESSURE SUPPORT)
PREFILLED_SYRINGE | INTRAVENOUS | Status: DC | PRN
  Administered 2020-01-23 (×2): 120 ug via INTRAVENOUS
  Administered 2020-01-23: 80 ug via INTRAVENOUS

## 2020-01-23 MED ORDER — BUPIVACAINE IN DEXTROSE 0.75-8.25 % IT SOLN
INTRATHECAL | Status: DC | PRN
  Administered 2020-01-23: 1.8 mL via INTRATHECAL

## 2020-01-23 MED ORDER — BUPIVACAINE-EPINEPHRINE (PF) 0.25% -1:200000 IJ SOLN
INTRAMUSCULAR | Status: DC | PRN
  Administered 2020-01-23: 30 mL

## 2020-01-23 MED ORDER — METOCLOPRAMIDE HCL 5 MG/ML IJ SOLN: 5.0000 mg | Freq: Three times a day (TID) | INTRAMUSCULAR | Status: DC | PRN

## 2020-01-23 MED ORDER — BUPIVACAINE-EPINEPHRINE (PF) 0.25% -1:200000 IJ SOLN
INTRAMUSCULAR | Status: AC
  Filled 2020-01-23: qty 30

## 2020-01-23 MED ORDER — POVIDONE-IODINE 10 % EX SWAB
2.0000 "application " | Freq: Once | CUTANEOUS | Status: AC
  Administered 2020-01-23: 2 via TOPICAL

## 2020-01-23 MED ORDER — PROPOFOL 1000 MG/100ML IV EMUL
INTRAVENOUS | Status: AC
  Filled 2020-01-23: qty 100

## 2020-01-23 MED ORDER — ROPINIROLE HCL 1 MG PO TABS
8.0000 mg | ORAL_TABLET | ORAL | Status: DC
  Administered 2020-01-23: 8 mg via ORAL
  Filled 2020-01-23: qty 8

## 2020-01-23 MED ORDER — HYDROMORPHONE HCL 1 MG/ML IJ SOLN
0.2500 mg | INTRAMUSCULAR | Status: DC | PRN
  Administered 2020-01-23: 0.25 mg via INTRAVENOUS
  Administered 2020-01-23 (×2): 0.5 mg via INTRAVENOUS
  Administered 2020-01-23 (×2): 0.25 mg via INTRAVENOUS

## 2020-01-23 MED ORDER — MEPERIDINE HCL 50 MG/ML IJ SOLN: 6.2500 mg | INTRAMUSCULAR | Status: DC | PRN

## 2020-01-23 MED ORDER — PROPOFOL 500 MG/50ML IV EMUL
INTRAVENOUS | Status: DC | PRN
  Administered 2020-01-23: 30 ug/kg/min via INTRAVENOUS

## 2020-01-23 SURGICAL SUPPLY — 68 items
BAG DECANTER FOR FLEXI CONT (MISCELLANEOUS) ×2 IMPLANT
BAG ZIPLOCK 12X15 (MISCELLANEOUS) IMPLANT
BIT DRILL 2.8X128 (BIT) ×2 IMPLANT
BLADE EXTENDED COATED 6.5IN (ELECTRODE) ×2 IMPLANT
BLADE SAW SAG 73X25 THK (BLADE) ×1
BLADE SAW SGTL 73X25 THK (BLADE) ×1 IMPLANT
BLADE SURG SZ10 CARB STEEL (BLADE) ×4 IMPLANT
CLSR STERI-STRIP ANTIMIC 1/2X4 (GAUZE/BANDAGES/DRESSINGS) ×4 IMPLANT
COVER SURGICAL LIGHT HANDLE (MISCELLANEOUS) ×2 IMPLANT
COVER WAND RF STERILE (DRAPES) IMPLANT
CUP ACETBLR 52 OD PINNACLE (Hips) ×2 IMPLANT
DECANTER SPIKE VIAL GLASS SM (MISCELLANEOUS) ×2 IMPLANT
DRAPE INCISE IOBAN 66X45 STRL (DRAPES) ×2 IMPLANT
DRAPE ORTHO SPLIT 77X108 STRL (DRAPES) ×2
DRAPE POUCH INSTRU U-SHP 10X18 (DRAPES) ×2 IMPLANT
DRAPE SURG ORHT 6 SPLT 77X108 (DRAPES) ×2 IMPLANT
DRAPE U-SHAPE 47X51 STRL (DRAPES) ×2 IMPLANT
DRESSING AQUACEL AG SP 3.5X10 (GAUZE/BANDAGES/DRESSINGS) ×1 IMPLANT
DRSG ADAPTIC 3X8 NADH LF (GAUZE/BANDAGES/DRESSINGS) ×2 IMPLANT
DRSG AQUACEL AG SP 3.5X10 (GAUZE/BANDAGES/DRESSINGS) ×2
DRSG MEPILEX BORDER 4X4 (GAUZE/BANDAGES/DRESSINGS) ×2 IMPLANT
DRSG MEPILEX BORDER 4X8 (GAUZE/BANDAGES/DRESSINGS) ×2 IMPLANT
DURAPREP 26ML APPLICATOR (WOUND CARE) ×2 IMPLANT
ELECT REM PT RETURN 15FT ADLT (MISCELLANEOUS) ×2 IMPLANT
EVACUATOR 1/8 PVC DRAIN (DRAIN) ×2 IMPLANT
FACESHIELD WRAPAROUND (MASK) ×8 IMPLANT
GAUZE SPONGE 4X4 12PLY STRL (GAUZE/BANDAGES/DRESSINGS) ×2 IMPLANT
GLOVE BIO SURGEON STRL SZ7 (GLOVE) ×2 IMPLANT
GLOVE BIO SURGEON STRL SZ8 (GLOVE) ×2 IMPLANT
GLOVE BIOGEL PI IND STRL 7.0 (GLOVE) ×2 IMPLANT
GLOVE BIOGEL PI IND STRL 8 (GLOVE) ×1 IMPLANT
GLOVE BIOGEL PI INDICATOR 7.0 (GLOVE) ×2
GLOVE BIOGEL PI INDICATOR 8 (GLOVE) ×1
GOWN STRL REUS W/TWL LRG LVL3 (GOWN DISPOSABLE) ×4 IMPLANT
HEAD BIO DELA CER SROM 32PLUS3 ×1 IMPLANT
IMMOBILIZER KNEE 20 (SOFTGOODS) ×2
IMMOBILIZER KNEE 20 THIGH 36 (SOFTGOODS) ×1 IMPLANT
KIT BASIN OR (CUSTOM PROCEDURE TRAY) ×2 IMPLANT
KIT TURNOVER KIT A (KITS) IMPLANT
LINER MARATHON NEUT +4X52X32 (Hips) ×2 IMPLANT
MANIFOLD NEPTUNE II (INSTRUMENTS) ×2 IMPLANT
NDL SAFETY ECLIPSE 18X1.5 (NEEDLE) ×2 IMPLANT
NEEDLE HYPO 18GX1.5 SHARP (NEEDLE) ×2
NS IRRIG 1000ML POUR BTL (IV SOLUTION) ×2 IMPLANT
PACK TOTAL JOINT (CUSTOM PROCEDURE TRAY) ×2 IMPLANT
PADDING CAST COTTON 6X4 STRL (CAST SUPPLIES) ×2 IMPLANT
PASSER SUT SWANSON 36MM LOOP (INSTRUMENTS) ×2 IMPLANT
PENCIL SMOKE EVACUATOR (MISCELLANEOUS) IMPLANT
PROTECTOR NERVE ULNAR (MISCELLANEOUS) ×2 IMPLANT
SLEEVE FEM PROX 18F LRG (Hips) ×2 IMPLANT
SROM BIO DELA CER HEAD 32PLUS3 ×2 IMPLANT
SROM FEMOR STEM HIP STD 18X13 (Hips) ×2 IMPLANT
STEM HIP FMRL SROM STD 18X13 (Hips) ×1 IMPLANT
STRIP CLOSURE SKIN 1/2X4 (GAUZE/BANDAGES/DRESSINGS) ×4 IMPLANT
SUT ETHIBOND NAB CT1 #1 30IN (SUTURE) ×4 IMPLANT
SUT MNCRL AB 3-0 PS2 18 (SUTURE) ×2 IMPLANT
SUT MNCRL AB 4-0 PS2 18 (SUTURE) ×2 IMPLANT
SUT STRATAFIX 0 PDS 27 VIOLET (SUTURE) ×2
SUT VIC AB 2-0 CT1 27 (SUTURE) ×3
SUT VIC AB 2-0 CT1 TAPERPNT 27 (SUTURE) ×3 IMPLANT
SUTURE STRATFX 0 PDS 27 VIOLET (SUTURE) ×1 IMPLANT
SYR 20ML LL LF (SYRINGE) ×2 IMPLANT
SYR 50ML LL SCALE MARK (SYRINGE) ×2 IMPLANT
TOWEL OR 17X26 10 PK STRL BLUE (TOWEL DISPOSABLE) ×4 IMPLANT
TOWEL OR NON WOVEN STRL DISP B (DISPOSABLE) ×2 IMPLANT
TRAY FOLEY MTR SLVR 16FR STAT (SET/KITS/TRAYS/PACK) ×2 IMPLANT
WATER STERILE IRR 1000ML POUR (IV SOLUTION) ×2 IMPLANT
YANKAUER SUCT BULB TIP 10FT TU (MISCELLANEOUS) ×2 IMPLANT

## 2020-01-23 NOTE — Anesthesia Postprocedure Evaluation (Signed)
Anesthesia Post Note  Patient: Justin Hall  Procedure(s) Performed: TOTAL HIP ARTHROPLASTY-Posterior (Left Hip)     Patient location during evaluation: PACU Anesthesia Type: Spinal Level of consciousness: awake Pain management: pain level controlled Vital Signs Assessment: post-procedure vital signs reviewed and stable Respiratory status: spontaneous breathing Cardiovascular status: stable Postop Assessment: spinal receding Anesthetic complications: no   No complications documented.  Last Vitals:  Vitals:   01/23/20 1100 01/23/20 1115  BP: (!) 152/87 138/79  Pulse: 77 64  Resp: 11 10  Temp:    SpO2: 97% 99%    Last Pain:  Vitals:   01/23/20 1109  TempSrc:   PainSc: 7                  John F [address]

## 2020-01-23 NOTE — Transfer of Care (Signed)
Immediate Anesthesia Transfer of Care Note  Patient: Justin Hall  Procedure(s) Performed: TOTAL HIP ARTHROPLASTY-Posterior (Left Hip)  Patient Location: PACU  Anesthesia Type:Spinal  Level of Consciousness: awake, alert , oriented and patient cooperative  Airway & Oxygen Therapy: Patient Spontanous Breathing and Patient connected to face mask  Post-op Assessment: Report given to RN and Post -op Vital signs reviewed and stable  Post vital signs: Reviewed and stable  Last Vitals:  Vitals Value Taken Time  BP 136/91 01/23/20 1030  Temp    Pulse 75 01/23/20 1031  Resp 13 01/23/20 1031  SpO2 99 % 01/23/20 1031  Vitals shown include unvalidated device data.  Last Pain:  Vitals:   01/23/20 0706  TempSrc:   PainSc: 0-No pain         Complications: No complications documented.

## 2020-01-23 NOTE — Interval H&P Note (Signed)
History and Physical Interval Note:  01/23/2020 6:34 AM  Justin Hall  has presented today for surgery, with the diagnosis of left hip osteoarthritis.  The various methods of treatment have been discussed with the patient and family. After consideration of risks, benefits and other options for treatment, the patient has consented to  Procedure(s) with comments: TOTAL HIP ARTHROPLASTY-Posterior (Left) - 179min as a surgical intervention.  The patient's history has been reviewed, patient examined, no change in status, stable for surgery.  I have reviewed the patient's chart and labs.  Questions were answered to the patient's satisfaction.     Pilar Plate Janaya Broy

## 2020-01-23 NOTE — Evaluation (Signed)
Physical Therapy Evaluation Patient Details Name: Justin Hall MRN: [id number] DOB: 19-Dec-1965 Today's Date: 01/23/2020   History of Present Illness  Patient is 54 y.o. male s/p Lt THA via posterolateral sppraoch on 01/23/20 with PMH significant for OA, HTN, DM, CHF, depression, anxiety.    Clinical Impression  Justin Hall is a 54 y.o. male POD 0 s/p Lt THA. Patient reports independence with mobility at baseline. Patient is now limited by functional impairments (see PT problem list below) and requires min assist for transfers and gait with RW. Patient was able to ambulate ~6 feet with RW and min assist and was greatly limited by pain today. Patient instructed in exercise to facilitate circulation. Patient will benefit from continued skilled PT interventions to address impairments and progress towards PLOF. Acute PT will follow to progress mobility and stair training in preparation for safe discharge home.     Follow Up Recommendations Follow surgeon's recommendation for DC plan and follow-up therapies    Equipment Recommendations  Rolling walker with 5" wheels (wide double wheel front walker)    Recommendations for Other Services       Precautions / Restrictions Precautions Precautions: Fall Restrictions Weight Bearing Restrictions: No      Mobility  Bed Mobility Overal bed mobility: Needs Assistance Bed Mobility: Supine to Sit     Supine to sit: Min assist;HOB elevated     General bed mobility comments: VC's to maintain posterior hip precautions. Assist required for support at [place] and to rasie trunk and steady at EOB.   Transfers Overall transfer level: Needs assistance Equipment used: Rolling walker (2 wheeled) Transfers: Sit to/from Stand Sit to Stand: Min assist;From elevated surface         General transfer comment: cues for safe hand placement/technique for power up with RW. Assist to rise, steady, and position walker.  Ambulation/Gait Ambulation/Gait  assistance: Min assist Gait Distance (Feet): 6 Feet Assistive device: Rolling walker (2 wheeled) Gait Pattern/deviations: Step-to pattern;Decreased stride length;Antalgic;Decreased weight shift to left Gait velocity: decr   General Gait Details: VC's for safe step pattern and management of RW, pt has tendency to step past front of walker. no overt LOB, pt heavily reliant on UE support and limited by pain.  Stairs            Wheelchair Mobility    Modified Rankin (Stroke Patients Only)       Balance Overall balance assessment: Needs assistance Sitting-balance support: Feet supported Sitting balance-Leahy Scale: Fair     Standing balance support: During functional activity;Bilateral upper extremity supported Standing balance-Leahy Scale: Poor                               Pertinent Vitals/Pain Pain Assessment: 0-10 Pain Score: 8  Pain Location: Lt hip Pain Descriptors / Indicators: Aching;Discomfort Pain Intervention(s): Limited activity within patient's tolerance;Monitored during session;Repositioned;Ice applied    Home Living Family/patient expects to be discharged to:: Private residence Living Arrangements: Spouse/significant other;Children Available Help at Discharge: Family Type of Home: House Home Access: Stairs to enter Entrance Stairs-Rails: Left Entrance Stairs-Number of Steps: 4 Home Layout: One level Home Equipment: Walker - 2 wheels Additional Comments: pt plans on staying with his friend/neighbor for the first five days. it is fully handicapp accessible home. no stairs with walk in shower and bathroom has grab bars everywhere.    Prior Function Level of Independence: Independent with assistive device(s)  Hand Dominance   Dominant Hand: Right    Extremity/Trunk Assessment   Upper Extremity Assessment Upper Extremity Assessment: Overall WFL for tasks assessed    Lower Extremity Assessment Lower Extremity  Assessment: Overall WFL for tasks assessed;LLE deficits/detail LLE: Unable to fully assess due to pain;Unable to fully assess due to immobilization LLE Sensation: WNL LLE Coordination: WNL    Cervical / Trunk Assessment Cervical / Trunk Assessment: Other exceptions Cervical / Trunk Exceptions: habitus  Communication   Communication: No difficulties  Cognition Arousal/Alertness: Awake/alert Behavior During Therapy: WFL for tasks assessed/performed Overall Cognitive Status: Within Functional Limits for tasks assessed                                        General Comments      Exercises Total Joint Exercises Ankle Circles/Pumps: AROM;Both;15 reps;Seated   Assessment/Plan    PT Assessment Patient needs continued PT services  PT Problem List Decreased range of motion;Decreased strength;Decreased activity tolerance;Decreased balance;Decreased mobility;Decreased knowledge of use of DME       PT Treatment Interventions DME instruction;Gait training;Stair training;Functional mobility training;Therapeutic activities;Therapeutic exercise;Balance training;Patient/family education    PT Goals (Current goals can be found in the Care Plan section)  Acute Rehab PT Goals Patient Stated Goal: stop hurting and get home PT Goal Formulation: With patient/family Time For Goal Achievement: 01/30/20 Potential to Achieve Goals: Good    Frequency 7X/week   Barriers to discharge        Co-evaluation               AM-PAC PT "6 Clicks" Mobility  Outcome Measure Help needed turning from your back to your side while in a flat bed without using bedrails?: A Little Help needed moving from lying on your back to sitting on the side of a flat bed without using bedrails?: A Little Help needed moving to and from a bed to a chair (including a wheelchair)?: A Little Help needed standing up from a chair using your arms (e.g., wheelchair or bedside chair)?: A Little Help needed to  walk in hospital room?: A Little Help needed climbing 3-5 steps with a railing? : A Lot 6 Click Score: 17    End of Session Equipment Utilized During Treatment: Gait belt;Left knee immobilizer Activity Tolerance: Patient tolerated treatment well;Patient limited by pain Patient left: in chair;with call bell/phone within reach;with chair alarm set;with family/visitor present Nurse Communication: Mobility status (2 person assist) PT Visit Diagnosis: Muscle weakness (generalized) (M62.81);Difficulty in walking, not elsewhere classified (R26.2)    Time: 8502-7741 PT Time Calculation (min) (ACUTE ONLY): 26 min   Charges:   PT Evaluation $PT Eval Low Complexity: 1 Low PT Treatments $Therapeutic Activity: 8-22 mins       Verner Mould, DPT Acute Rehabilitation Services  Office [phone] Pager [phone]  01/23/2020 5:29 PM

## 2020-01-23 NOTE — Anesthesia Procedure Notes (Signed)
Spinal  Patient location during procedure: OR Start time: 01/23/2020 8:45 AM End time: 01/23/2020 8:47 AM Staffing Performed: anesthesiologist  Anesthesiologist: Lyn Hollingshead, MD Preanesthetic Checklist Completed: patient identified, IV checked, site marked, risks and benefits discussed, surgical consent, monitors and equipment checked, pre-op evaluation and timeout performed Spinal Block Patient position: sitting Prep: DuraPrep and site prepped and draped Patient monitoring: continuous pulse ox and blood pressure Approach: midline Location: L3-4 Injection technique: single-shot Needle Needle type: Pencan  Needle gauge: 24 G Needle length: 10 cm Needle insertion depth: 8 cm Assessment Sensory level: T8

## 2020-01-23 NOTE — Op Note (Addendum)
Pre-operative diagnosis- Osteoarthritis Left hip  Post-operative diagnosis- Osteoarthritis  Left hip  Procedure-  LeftTotal Hip Arthroplasty  Surgeon- Justin Plover. Tonjua Rossetti, MD  Assistant- Molli Barrows, PA-C   Anesthesia  Spinal  EBL- 200 mL   Drain None   Complication- None  Condition-PACU - hemodynamically stable.   Brief Clinical Note- Justin Hall is a 54 y.o. male with end stage arthritis of his left hip with progressively worsening pain and dysfunction. Pain occurs with activity and rest including pain at night. He has tried analgesics, protected weight bearing and rest without benefit. Pain is too severe to attempt physical therapy. Radiographs demonstrate bone on bone arthritis with subchondral cyst formation. He presents now for left THA.  Procedure in detail-   The patient is brought into the operating room and placed on the operating table. After successful administration of Spinal  anesthesia, the patient is placed in the  Right lateral decubitus position with the  Left side up and held in place with the hip positioner. The lower extremity is isolated from the perineum with plastic drapes and time-out is performed by the surgical team. The lower extremity is then prepped and draped in the usual sterile fashion. A short posterolateral incision is made with a ten blade through the subcutaneous tissue to the level of the fascia lata which is incised in line with the skin incision. The sciatic nerve is palpated and protected and the short external rotators and capsule are isolated from the femur. The hip is then dislocated and the center of the femoral head is marked. A trial prosthesis is placed such that the trial head corresponds to the center of the patients' native femoral head. The resection level is marked on the femoral neck and the resection is made with an oscillating saw. The femoral head is removed and femoral retractors placed to gain access to the femoral canal.      The  canal finder is passed into the femoral canal and the canal is thoroughly irrigated with sterile saline to remove the fatty contents. Axial reaming is performed to 13.5  mm, proximal reaming to 18 F  and the sleeve machined to a large. A 18 F large trial sleeve is placed into the proximal femur.      The femur is then retracted anteriorly to gain acetabular exposure. Acetabular retractors are placed and the labrum and osteophytes are removed, Acetabular reaming is performed to 51  mm and a 52  mm Pinnacle acetabular shell is placed in anatomic position with excellent purchase. Additional dome screws were not needed. The permanent 32 mm neutral + 4 Marathon liner is placed into the acetabular shell.      The trial femur is then placed into the femoral canal. The size is 18 x 13  stem with a 36 + 12  neck and a 32 + 3 head with the neck version matching  the patients' native anteversion. The hip is reduced with excellent stability with full extension and full external rotation, 70 degrees flexion with 40 degrees adduction and 90 degrees internal rotation and 90 degrees of flexion with 70 degrees of internal rotation. The operative leg is placed on top of the non-operative leg and the leg lengths are found to be equal. The trials are then removed and the permanent implant of the same size is impacted into the femoral canal. The ceramic femoral head of the same size as the trial is placed and the hip is reduced with the same stability  parameters. The operative leg is again placed on top of the non-operative leg and the leg lengths are found to be equal.      The wound is then copiously irrigated with saline solution and the capsule and short external rotators are re-attached to the femur through drill holes with Ethibond suture. The fascia lata is closed  with # 0 Stratofix suture and the fascia lata, gluteal muscles and subcutaneous tissues are injected with 30 ml of .25% Marcaine. The subcutaneous tissues are  closed with #1 and2-0 vicryl and the subcuticular layer closed with running 4-0 Monocryl. The, incision is cleaned and dried, and steri-srips and a bulky sterile dressing applied. The limb is placed into a knee immobilizer and the patient is awakened and transported to recovery in stable condition.      Please note that a surgical assistant was a medical necessity for this procedure in order to perform it in a safe and expeditious manner. The assistant was necessary to provide retraction to the vital neurovascular structures and to retract and position the limb to allow for anatomic placement of the prosthetic components.  Justin Plover [name], MD    01/23/2020, 10:05 AM

## 2020-01-24 ENCOUNTER — Encounter (HOSPITAL_COMMUNITY): Payer: Self-pay | Admitting: Orthopedic Surgery

## 2020-01-24 DIAGNOSIS — M1612 Unilateral primary osteoarthritis, left hip: Secondary | ICD-10-CM | POA: Diagnosis not present

## 2020-01-24 LAB — BASIC METABOLIC PANEL
Anion gap: 10 (ref 5–15)
BUN: 25 mg/dL — ABNORMAL HIGH (ref 6–20)
CO2: 27 mmol/L (ref 22–32)
Calcium: 8.6 mg/dL — ABNORMAL LOW (ref 8.9–10.3)
Chloride: 100 mmol/L (ref 98–111)
Creatinine, Ser: 1.41 mg/dL — ABNORMAL HIGH (ref 0.61–1.24)
GFR calc Af Amer: >60 mL/min (ref >60)
GFR calc non Af Amer: 55 mL/min — ABNORMAL LOW (ref >60)
Glucose, Bld: 177 mg/dL — ABNORMAL HIGH (ref 70–99)
Potassium: 4.3 mmol/L (ref 3.5–5.1)
Sodium: 137 mmol/L (ref 135–145)

## 2020-01-24 LAB — CBC
HCT: 34 % — ABNORMAL LOW (ref 39.0–52.0)
Hemoglobin: 11.3 g/dL — ABNORMAL LOW (ref 13.0–17.0)
MCH: 27.4 pg (ref 26.0–34.0)
MCHC: 33.2 g/dL (ref 30.0–36.0)
MCV: 82.3 fL (ref 80.0–100.0)
Platelets: 248 10*3/uL (ref 150–400)
RBC: 4.13 MIL/uL — ABNORMAL LOW (ref 4.22–5.81)
RDW: 14.1 % (ref 11.5–15.5)
WBC: 15 10*3/uL — ABNORMAL HIGH (ref 4.0–10.5)
nRBC: 0 % (ref 0.0–0.2)

## 2020-01-24 LAB — HEMOGLOBIN A1C
Hgb A1c MFr Bld: 6.6 % — ABNORMAL HIGH (ref 4.8–5.6)
Mean Plasma Glucose: 142.72 mg/dL

## 2020-01-24 LAB — GLUCOSE, CAPILLARY
Glucose-Capillary: 165 mg/dL — ABNORMAL HIGH (ref 70–99)
Glucose-Capillary: 206 mg/dL — ABNORMAL HIGH (ref 70–99)

## 2020-01-24 MED ORDER — ASPIRIN 325 MG PO TBEC: 325.0000 mg | DELAYED_RELEASE_TABLET | Freq: Two times a day (BID) | ORAL | 0 refills | Status: AC

## 2020-01-24 MED ORDER — METHOCARBAMOL 500 MG PO TABS: 500.0000 mg | ORAL_TABLET | Freq: Four times a day (QID) | ORAL | 0 refills | Status: DC | PRN

## 2020-01-24 MED ORDER — OXYCODONE HCL 5 MG PO TABS
5.0000 mg | ORAL_TABLET | Freq: Four times a day (QID) | ORAL | 0 refills | Status: DC | PRN
Start: 2020-01-24 — End: 2020-02-20

## 2020-01-24 NOTE — Progress Notes (Signed)
Physical Therapy Treatment Patient Details Name: Justin Hall MRN: [id number] DOB: 04/04/1966 Today's Date: 01/24/2020    History of Present Illness Patient is 54 y.o. male s/p Lt THA via posterolateral sppraoch on 01/23/20 with PMH significant for OA, HTN, DM, CHF, depression, anxiety.    PT Comments    POD # 1 2nd  session as spouse just arrived to unit and had multiple questions.  Educated spouse on pt's current mobility status.  The use of gait belt with loop to self assist LE on/off bed as well as use for TE's.  Reviewed THP and demonstrated proper sleeping position of L LE in both supine and sidelying.  Instructed on HEP and also walking tolerance/frequency/safety using walker.   Addressed all mobility questions, discussed appropriate activity, educated on use of ICE.  Pt ready for D/C to home.   Follow Up Recommendations  Follow surgeons recommendation for DC plan and follow-up therapies;Other (comment) (HEP)     Equipment Recommendations  Rolling walker with 5" wheels;3in1 (PT)    Recommendations for Other Services       Precautions / Restrictions Precautions Precautions: Fall;Posterior Hip Precaution Comments: pt recalled 2/3 THP so reducated pt and spouse Restrictions Weight Bearing Restrictions: No Other Position/Activity Restrictions: WBAT    Mobility  Bed Mobility     General bed mobility comments: pt OOB in recliner                                                 Cognition Arousal/Alertness: Awake/alert Behavior During Therapy: WFL for tasks assessed/performed Overall Cognitive Status: Within Functional Limits for tasks assessed                                 General Comments: AxIO x 3 very motivated      Exercises      General Comments        Pertinent Vitals/Pain Pain Assessment: 0-10 Pain Score: 6  Pain Location: LEFT hip/buttock Pain Descriptors / Indicators: Grimacing;Tender;Operative site  guarding;Sore Pain Intervention(s): Monitored during session;Premedicated before session;Repositioned;Ice applied    Home Living                      Prior Function            PT Goals (current goals can now be found in the care plan section) Progress towards PT goals: Progressing toward goals    Frequency    7X/week      PT Plan Current plan remains appropriate    Co-evaluation              AM-PAC PT "6 Clicks" Mobility   Outcome Measure  Help needed turning from your back to your side while in a flat bed without using bedrails?: None Help needed moving from lying on your back to sitting on the side of a flat bed without using bedrails?: None Help needed moving to and from a bed to a chair (including a wheelchair)?: None Help needed standing up from a chair using your arms (e.g., wheelchair or bedside chair)?: None Help needed to walk in hospital room?: None Help needed climbing 3-5 steps with a railing? : A Little 6 Click Score: 23    End of Session Equipment Utilized During Treatment: Gait belt Activity Tolerance:  Patient tolerated treatment well Patient left: in chair;with call bell/phone within reach Nurse Communication: Mobility status PT Visit Diagnosis: Muscle weakness (generalized) (M62.81);Difficulty in walking, not elsewhere classified (R26.2)     Time: 1152-1206 PT Time Calculation (min) (ACUTE ONLY): 14 min  Charges:   $Self Care/Home Management: [place]  PTA Acute  Rehabilitation Services Pager      [phone] Office      [phone]

## 2020-01-24 NOTE — TOC Transition Note (Signed)
Transition of Care [facility]) - CM/SW Discharge Note   Patient Details  Name: Justin Hall MRN: [id number] Date of Birth: April 07, 1966  [facility]) CM/SW Contact:  Lia Hopping, [place] Phone Number: 01/24/2020, 10:23 AM   Clinical Narrative:    Therapy Plan: HEP Wide RW and 3 in1 to be delivered to the patient bedside by [employer]   Final next level of care: Home/Self Care (HEP) Barriers to Discharge: Barriers Resolved   Patient Goals and CMS Choice Patient states their goals for this hospitalization and ongoing recovery are:: return home   Choice offered to / list presented to : NA  Discharge Placement                       Discharge Plan and Services                DME Arranged: 3-N-1, [place] wide DME Agency: [employer] Date DME Agency Contacted: 01/24/20 Time DME Agency Contacted: 1000 Representative spoke with at DME Agency: [place]: NA [place] Agency: NA        Social Determinants of Health ([place]) Interventions     Readmission Risk Interventions No flowsheet data found.
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
## 2020-01-24 NOTE — Plan of Care (Signed)
Patient discharged home in stable condition 

## 2020-01-24 NOTE — Plan of Care (Signed)
Plan of care reviewed and discussed with the patient. 

## 2020-01-24 NOTE — Progress Notes (Signed)
Physical Therapy Treatment Patient Details Name: Justin Hall MRN: [id number] DOB: 01-06-1966 Today's Date: 01/24/2020    History of Present Illness Patient is 54 y.o. male s/p Lt THA via posterolateral sppraoch on 01/23/20 with PMH significant for OA, HTN, DM, CHF, depression, anxiety.    PT Comments    POD # 1 am session Assisted OOB.  General bed mobility comments: demonstarted and instructed how to use a belt to self assist L LE off bed.  required increased time due to BMI/ABD girth.  Assisted with amb in hallway.  Then returned to room to perform some TE's following HEP handout.  Instructed on proper tech, freq as well as use of ICE.     Follow Up Recommendations  Follow surgeon's recommendation for DC plan and follow-up therapies;Other (comment) (HEP)     Equipment Recommendations  Rolling walker with 5" wheels;3in1 (PT)    Recommendations for Other Services       Precautions / Restrictions Precautions Precautions: Fall;Posterior Hip Precaution Comments: pt recalled 2/3 THP so reducated pt and spouse Restrictions Weight Bearing Restrictions: No Other Position/Activity Restrictions: WBAT    Mobility  Bed Mobility Overal bed mobility: Needs Assistance Bed Mobility: Supine to Sit     Supine to sit: Min assist     General bed mobility comments: demonstarted and instructed how to use a belt to self assist L LE off bed.  required increased time due to BMI/ABD girth  Transfers Overall transfer level: Needs assistance Equipment used: Rolling walker (2 wheeled) (Bariatric) Transfers: Sit to/from [facility] Sit to Stand: Supervision Stand pivot transfers: Supervision       General transfer comment: cues for safe hand placement/technique for power up with RW.  Ambulation/Gait Ambulation/Gait assistance: Supervision;Min guard Gait Distance (Feet): 85 Feet Assistive device: Rolling walker (2 wheeled) (Bariatric) Gait Pattern/deviations: Step-to  pattern;Decreased stride length;Decreased weight shift to left Gait velocity: decreased   General Gait Details: <25% VC's on proper walker to self distance.  Tolerated an increased distance.  Still excessive suppor/lean t B UE's through walker.  Educted "walking is best exercise in moderationm"   Stairs             Wheelchair Mobility    Modified Rankin (Stroke Patients Only)       Balance                                            Cognition Arousal/Alertness: Awake/alert Behavior During Therapy: WFL for tasks assessed/performed Overall Cognitive Status: Within Functional Limits for tasks assessed                                 General Comments: AxIO x 3 very motivated      Exercises   Total Hip Replacement TE's following HEP Handout 10 reps ankle pumps 05 reps knee presses 05 reps heel slides 05 reps SAQ's 05 reps ABD Instructed how to use a belt loop to assist  Followed by ICE    General Comments        Pertinent Vitals/Pain Pain Assessment: 0-10 Pain Score: 6  Pain Location: LEFT hip/buttock Pain Descriptors / Indicators: Grimacing;Tender;Operative site guarding;Sore Pain Intervention(s): Monitored during session;Premedicated before session;Repositioned;Ice applied    Home Living  Prior Function            PT Goals (current goals can now be found in the care plan section) Progress towards PT goals: Progressing toward goals    Frequency    7X/week      PT Plan Current plan remains appropriate    Co-evaluation              AM-PAC PT "6 Clicks" Mobility   Outcome Measure  Help needed turning from your back to your side while in a flat bed without using bedrails?: None Help needed moving from lying on your back to sitting on the side of a flat bed without using bedrails?: None Help needed moving to and from a bed to a chair (including a wheelchair)?: None Help needed  standing up from a chair using your arms (e.g., wheelchair or bedside chair)?: None Help needed to walk in hospital room?: None Help needed climbing 3-5 steps with a railing? : A Little 6 Click Score: 23    End of Session Equipment Utilized During Treatment: Gait belt Activity Tolerance: Patient tolerated treatment well Patient left: in chair;with call bell/phone within reach Nurse Communication: Mobility status PT Visit Diagnosis: Muscle weakness (generalized) (M62.81);Difficulty in walking, not elsewhere classified (R26.2)     Time: 1660-6004 PT Time Calculation (min) (ACUTE ONLY): 27 min  Charges:  $Gait Training: 8-22 mins $Therapeutic Exercise: 8-22 mins                     Rica Koyanagi  PTA Acute  Rehabilitation Services Pager      [phone] Office      [phone]
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
## 2020-02-03 ENCOUNTER — Other Ambulatory Visit: Payer: Self-pay

## 2020-02-04 NOTE — Telephone Encounter (Signed)
Requesting:oxycodone Contract:n/a UDS:n/a Last Visit:11/23/19 Next Visit:n/a Last Refill:01/10/20  Please Advise

## 2020-02-05 NOTE — Telephone Encounter (Signed)
He should be getting pain medicine from Dr. Maureen Ralphs after surg, correct?

## 2020-02-06 NOTE — Telephone Encounter (Signed)
He stated he had only enough for a week after surgery, but he is seeing Dr. Maureen Ralphs tomorrow and will discuss with him.

## 2020-02-08 ENCOUNTER — Other Ambulatory Visit: Payer: Self-pay

## 2020-02-08 MED ORDER — OXYCODONE HCL 10 MG PO TABS
10.0000 mg | ORAL_TABLET | Freq: Three times a day (TID) | ORAL | 0 refills | Status: DC | PRN
Start: 2020-02-08 — End: 2020-02-20

## 2020-02-08 NOTE — Telephone Encounter (Signed)
He stated he had fluid removed from his hip yesterday Please see mychart message

## 2020-02-08 NOTE — Telephone Encounter (Signed)
Isn't ortho managing pain post op?

## 2020-02-08 NOTE — Telephone Encounter (Signed)
Requesting: oxycodone 10mg  Contract: None UDS:  None Last Visit: 11/23/2019 Next Visit: None scheduled Last Refill: 01/10/2020 #75 and 0RF Pt sig: 1 tab q8h prn  Please Advise

## 2020-02-16 ENCOUNTER — Telehealth: Payer: Self-pay | Admitting: Family Medicine

## 2020-02-16 NOTE — Telephone Encounter (Signed)
Patient states he needs prednisone for gout flare up

## 2020-02-17 NOTE — Telephone Encounter (Signed)
Called again/no answer///voice mail not set up yet

## 2020-02-17 NOTE — Telephone Encounter (Signed)
Sched pt today, can be virtual. Ty.

## 2020-02-17 NOTE — Telephone Encounter (Signed)
Called no answer voicemail not set up °

## 2020-02-20 ENCOUNTER — Other Ambulatory Visit: Payer: Self-pay

## 2020-02-20 ENCOUNTER — Encounter: Payer: Self-pay | Admitting: Family Medicine

## 2020-02-20 ENCOUNTER — Telehealth (INDEPENDENT_AMBULATORY_CARE_PROVIDER_SITE_OTHER): Payer: No Typology Code available for payment source | Admitting: Family Medicine

## 2020-02-20 DIAGNOSIS — M1A9XX0 Chronic gout, unspecified, without tophus (tophi): Secondary | ICD-10-CM | POA: Diagnosis not present

## 2020-02-20 DIAGNOSIS — M109 Gout, unspecified: Secondary | ICD-10-CM

## 2020-02-20 DIAGNOSIS — G2581 Restless legs syndrome: Secondary | ICD-10-CM | POA: Diagnosis not present

## 2020-02-20 MED ORDER — ALLOPURINOL 300 MG PO TABS: 300.0000 mg | ORAL_TABLET | Freq: Two times a day (BID) | ORAL | 2 refills | Status: DC

## 2020-02-20 MED ORDER — METHYLPREDNISOLONE 4 MG PO TBPK: ORAL_TABLET | ORAL | 0 refills | Status: DC

## 2020-02-20 MED ORDER — ROPINIROLE HCL 4 MG PO TABS: 4.0000 mg | ORAL_TABLET | ORAL | 2 refills | Status: DC

## 2020-02-20 NOTE — Telephone Encounter (Signed)
Patient is scheduled today 02/20/2020
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
## 2020-02-22 ENCOUNTER — Encounter (HOSPITAL_COMMUNITY): Payer: Self-pay | Admitting: Orthopedic Surgery

## 2020-02-22 ENCOUNTER — Other Ambulatory Visit (HOSPITAL_COMMUNITY)
Admission: RE | Admit: 2020-02-22 | Discharge: 2020-02-22 | Disposition: A | Discharge status: Home / Self Care | Payer: No Typology Code available for payment source | Source: Ambulatory Visit | Attending: Orthopedic Surgery | Admitting: Orthopedic Surgery

## 2020-02-22 ENCOUNTER — Other Ambulatory Visit: Payer: Self-pay

## 2020-02-22 DIAGNOSIS — Z01812 Encounter for preprocedural laboratory examination: Secondary | ICD-10-CM | POA: Insufficient documentation

## 2020-02-22 DIAGNOSIS — Z20822 Contact with and (suspected) exposure to covid-19: Secondary | ICD-10-CM | POA: Insufficient documentation

## 2020-02-22 LAB — SARS CORONAVIRUS 2 (TAT 6-24 HRS): SARS Coronavirus 2: NEGATIVE

## 2020-02-22 NOTE — Progress Notes (Signed)
COVID Vaccine Completed: Date COVID Vaccine completed: COVID vaccine manufacturer: [place]   PCP - Riki Sheer Cardiologist - Elouise Munroe, MD w/Cardiac Clearance dated 01-06-20  Chest x-ray -  EKG - 12-29-19 Stress Test -  ECHO -10-17-18  Cardiac Cath -  Pacemaker/ICD device last checked:  Sleep Study -  CPAP -   HGA1C 01-24-20 6.6  Fasting Blood Sugar -  Checks Blood Sugar __0___ times a day  Blood Thinner Instructions: Aspirin Instructions: Last Dose:  Anesthesia review:   Patient denies shortness of breath, fever, cough and chest pain at PAT appointment   Patient verbalized understanding of instructions that were given to them at the PAT appointment. Patient was also instructed that they will need to review over the PAT instructions again at home before surgery.

## 2020-02-23 ENCOUNTER — Inpatient Hospital Stay (HOSPITAL_COMMUNITY)
Admission: RE | Admit: 2020-02-23 | Discharge: 2020-02-28 | DRG: 467 | Disposition: A | Discharge status: Home / Self Care | Payer: No Typology Code available for payment source | Attending: Orthopedic Surgery | Admitting: Orthopedic Surgery

## 2020-02-23 ENCOUNTER — Inpatient Hospital Stay (HOSPITAL_COMMUNITY): Payer: No Typology Code available for payment source

## 2020-02-23 ENCOUNTER — Ambulatory Visit: Payer: Self-pay | Admitting: Student

## 2020-02-23 ENCOUNTER — Inpatient Hospital Stay (HOSPITAL_COMMUNITY): Payer: No Typology Code available for payment source | Admitting: Certified Registered Nurse Anesthetist

## 2020-02-23 ENCOUNTER — Encounter (HOSPITAL_COMMUNITY)
Admission: RE | Disposition: A | Discharge status: Home / Self Care | Payer: Self-pay | Source: Home / Self Care | Attending: Orthopedic Surgery

## 2020-02-23 ENCOUNTER — Encounter (HOSPITAL_COMMUNITY): Payer: Self-pay | Admitting: Orthopedic Surgery

## 2020-02-23 DIAGNOSIS — T8452XA Infection and inflammatory reaction due to internal left hip prosthesis, initial encounter: Principal | ICD-10-CM | POA: Diagnosis present

## 2020-02-23 DIAGNOSIS — E1122 Type 2 diabetes mellitus with diabetic chronic kidney disease: Secondary | ICD-10-CM | POA: Diagnosis present

## 2020-02-23 DIAGNOSIS — T8459XA Infection and inflammatory reaction due to other internal joint prosthesis, initial encounter: Secondary | ICD-10-CM

## 2020-02-23 DIAGNOSIS — Z96649 Presence of unspecified artificial hip joint: Secondary | ICD-10-CM

## 2020-02-23 DIAGNOSIS — M109 Gout, unspecified: Secondary | ICD-10-CM | POA: Diagnosis present

## 2020-02-23 DIAGNOSIS — Z833 Family history of diabetes mellitus: Secondary | ICD-10-CM

## 2020-02-23 DIAGNOSIS — Z8249 Family history of ischemic heart disease and other diseases of the circulatory system: Secondary | ICD-10-CM | POA: Diagnosis not present

## 2020-02-23 DIAGNOSIS — I130 Hypertensive heart and chronic kidney disease with heart failure and stage 1 through stage 4 chronic kidney disease, or unspecified chronic kidney disease: Secondary | ICD-10-CM | POA: Diagnosis present

## 2020-02-23 DIAGNOSIS — G4730 Sleep apnea, unspecified: Secondary | ICD-10-CM | POA: Diagnosis present

## 2020-02-23 DIAGNOSIS — I5032 Chronic diastolic (congestive) heart failure: Secondary | ICD-10-CM | POA: Diagnosis present

## 2020-02-23 DIAGNOSIS — N1832 Chronic kidney disease, stage 3b: Secondary | ICD-10-CM | POA: Diagnosis present

## 2020-02-23 DIAGNOSIS — Z79899 Other long term (current) drug therapy: Secondary | ICD-10-CM

## 2020-02-23 DIAGNOSIS — Z09 Encounter for follow-up examination after completed treatment for conditions other than malignant neoplasm: Secondary | ICD-10-CM | POA: Diagnosis not present

## 2020-02-23 DIAGNOSIS — D62 Acute posthemorrhagic anemia: Secondary | ICD-10-CM | POA: Diagnosis not present

## 2020-02-23 DIAGNOSIS — G2581 Restless legs syndrome: Secondary | ICD-10-CM | POA: Diagnosis present

## 2020-02-23 DIAGNOSIS — E875 Hyperkalemia: Secondary | ICD-10-CM | POA: Diagnosis not present

## 2020-02-23 DIAGNOSIS — F419 Anxiety disorder, unspecified: Secondary | ICD-10-CM | POA: Diagnosis present

## 2020-02-23 DIAGNOSIS — M170 Bilateral primary osteoarthritis of knee: Secondary | ICD-10-CM | POA: Diagnosis present

## 2020-02-23 DIAGNOSIS — Z20822 Contact with and (suspected) exposure to covid-19: Secondary | ICD-10-CM | POA: Diagnosis present

## 2020-02-23 DIAGNOSIS — N1831 Chronic kidney disease, stage 3a: Secondary | ICD-10-CM | POA: Diagnosis not present

## 2020-02-23 DIAGNOSIS — E119 Type 2 diabetes mellitus without complications: Secondary | ICD-10-CM | POA: Diagnosis not present

## 2020-02-23 DIAGNOSIS — N179 Acute kidney failure, unspecified: Secondary | ICD-10-CM | POA: Diagnosis not present

## 2020-02-23 DIAGNOSIS — Z6841 Body Mass Index (BMI) 40.0 and over, adult: Secondary | ICD-10-CM

## 2020-02-23 DIAGNOSIS — Z809 Family history of malignant neoplasm, unspecified: Secondary | ICD-10-CM | POA: Diagnosis not present

## 2020-02-23 DIAGNOSIS — Z8261 Family history of arthritis: Secondary | ICD-10-CM | POA: Diagnosis not present

## 2020-02-23 DIAGNOSIS — I10 Essential (primary) hypertension: Secondary | ICD-10-CM | POA: Diagnosis not present

## 2020-02-23 LAB — GLUCOSE, CAPILLARY
Glucose-Capillary: 106 mg/dL — ABNORMAL HIGH (ref 70–99)
Glucose-Capillary: 159 mg/dL — ABNORMAL HIGH (ref 70–99)
Glucose-Capillary: 180 mg/dL — ABNORMAL HIGH (ref 70–99)

## 2020-02-23 LAB — COMPREHENSIVE METABOLIC PANEL
ALT: 17 U/L (ref 0–44)
AST: 23 U/L (ref 15–41)
Albumin: 3 g/dL — ABNORMAL LOW (ref 3.5–5.0)
Alkaline Phosphatase: 88 U/L (ref 38–126)
Anion gap: 10 (ref 5–15)
BUN: 29 mg/dL — ABNORMAL HIGH (ref 6–20)
CO2: 27 mmol/L (ref 22–32)
Calcium: 8.5 mg/dL — ABNORMAL LOW (ref 8.9–10.3)
Chloride: 97 mmol/L — ABNORMAL LOW (ref 98–111)
Creatinine, Ser: 1.85 mg/dL — ABNORMAL HIGH (ref 0.61–1.24)
GFR, Estimated: 42 mL/min — ABNORMAL LOW (ref >60)
Glucose, Bld: 106 mg/dL — ABNORMAL HIGH (ref 70–99)
Potassium: 4.4 mmol/L (ref 3.5–5.1)
Sodium: 134 mmol/L — ABNORMAL LOW (ref 135–145)
Total Bilirubin: 0.6 mg/dL (ref 0.3–1.2)
Total Protein: 7.9 g/dL (ref 6.5–8.1)

## 2020-02-23 LAB — URINALYSIS, ROUTINE W REFLEX MICROSCOPIC
Bilirubin Urine: NEGATIVE
Glucose, UA: NEGATIVE mg/dL
Hgb urine dipstick: NEGATIVE
Ketones, ur: NEGATIVE mg/dL
Leukocytes,Ua: NEGATIVE
Nitrite: NEGATIVE
Protein, ur: NEGATIVE mg/dL
Specific Gravity, Urine: 1.013 (ref 1.005–1.030)
pH: 6 (ref 5.0–8.0)

## 2020-02-23 LAB — PROTIME-INR
INR: 1.1 (ref 0.8–1.2)
Prothrombin Time: 13.6 seconds (ref 11.4–15.2)

## 2020-02-23 LAB — CBC
HCT: 29.9 % — ABNORMAL LOW (ref 39.0–52.0)
Hemoglobin: 9.5 g/dL — ABNORMAL LOW (ref 13.0–17.0)
MCH: 26 pg (ref 26.0–34.0)
MCHC: 31.8 g/dL (ref 30.0–36.0)
MCV: 81.7 fL (ref 80.0–100.0)
Platelets: 483 10*3/uL — ABNORMAL HIGH (ref 150–400)
RBC: 3.66 MIL/uL — ABNORMAL LOW (ref 4.22–5.81)
RDW: 14.6 % (ref 11.5–15.5)
WBC: 9.5 10*3/uL (ref 4.0–10.5)
nRBC: 0 % (ref 0.0–0.2)

## 2020-02-23 LAB — SURGICAL PCR SCREEN
MRSA, PCR: POSITIVE — AB
Staphylococcus aureus: POSITIVE — AB

## 2020-02-23 LAB — HEMOGLOBIN AND HEMATOCRIT, BLOOD
HCT: 26.3 % — ABNORMAL LOW (ref 39.0–52.0)
Hemoglobin: 8.2 g/dL — ABNORMAL LOW (ref 13.0–17.0)

## 2020-02-23 SURGERY — IRRIGATION AND DEBRIDEMENT POSTERIOR HIP
Anesthesia: Spinal | Laterality: Left

## 2020-02-23 MED ORDER — SODIUM CHLORIDE 0.9 % IV SOLN: INTRAVENOUS | Status: DC

## 2020-02-23 MED ORDER — OXYCODONE HCL 5 MG PO TABS
5.0000 mg | ORAL_TABLET | ORAL | Status: DC | PRN
  Filled 2020-02-23 (×2): qty 2

## 2020-02-23 MED ORDER — FENTANYL CITRATE (PF) 250 MCG/5ML IJ SOLN
INTRAMUSCULAR | Status: AC
  Filled 2020-02-23: qty 5

## 2020-02-23 MED ORDER — METFORMIN HCL 500 MG PO TABS
500.0000 mg | ORAL_TABLET | Freq: Two times a day (BID) | ORAL | Status: DC
  Administered 2020-02-24 – 2020-02-26 (×5): 500 mg via ORAL
  Filled 2020-02-23 (×5): qty 1

## 2020-02-23 MED ORDER — MIDAZOLAM HCL 5 MG/5ML IJ SOLN
INTRAMUSCULAR | Status: DC | PRN
  Administered 2020-02-23: 2 mg via INTRAVENOUS

## 2020-02-23 MED ORDER — FENTANYL CITRATE (PF) 100 MCG/2ML IJ SOLN
INTRAMUSCULAR | Status: DC | PRN
  Administered 2020-02-23 (×3): 50 ug via INTRAVENOUS

## 2020-02-23 MED ORDER — PHENYLEPHRINE 40 MCG/ML (10ML) SYRINGE FOR IV PUSH (FOR BLOOD PRESSURE SUPPORT)
PREFILLED_SYRINGE | INTRAVENOUS | Status: DC | PRN
  Administered 2020-02-23: 160 ug via INTRAVENOUS
  Administered 2020-02-23: 120 ug via INTRAVENOUS

## 2020-02-23 MED ORDER — PHENYLEPHRINE HCL (PRESSORS) 10 MG/ML IV SOLN
INTRAVENOUS | Status: AC
  Filled 2020-02-23: qty 1

## 2020-02-23 MED ORDER — SENNA 8.6 MG PO TABS
1.0000 | ORAL_TABLET | Freq: Two times a day (BID) | ORAL | Status: DC
  Administered 2020-02-23 – 2020-02-28 (×10): 8.6 mg via ORAL
  Filled 2020-02-23 (×10): qty 1

## 2020-02-23 MED ORDER — INSULIN ASPART 100 UNIT/ML ~~LOC~~ SOLN: 0.0000 [IU] | Freq: Every day | SUBCUTANEOUS | Status: DC

## 2020-02-23 MED ORDER — BUPIVACAINE HCL (PF) 0.5 % IJ SOLN
INTRAMUSCULAR | Status: AC
  Filled 2020-02-23: qty 30

## 2020-02-23 MED ORDER — DEXAMETHASONE SODIUM PHOSPHATE 10 MG/ML IJ SOLN
INTRAMUSCULAR | Status: DC | PRN
  Administered 2020-02-23: 10 mg via INTRAVENOUS

## 2020-02-23 MED ORDER — PROMETHAZINE HCL 25 MG/ML IJ SOLN: 6.2500 mg | INTRAMUSCULAR | Status: DC | PRN

## 2020-02-23 MED ORDER — ORAL CARE MOUTH RINSE: 15.0000 mL | Freq: Once | OROMUCOSAL | Status: AC

## 2020-02-23 MED ORDER — ONDANSETRON HCL 4 MG/2ML IJ SOLN
INTRAMUSCULAR | Status: DC | PRN
  Administered 2020-02-23: 4 mg via INTRAVENOUS

## 2020-02-23 MED ORDER — DOCUSATE SODIUM 100 MG PO CAPS
100.0000 mg | ORAL_CAPSULE | Freq: Two times a day (BID) | ORAL | Status: DC
  Administered 2020-02-23 – 2020-02-28 (×10): 100 mg via ORAL
  Filled 2020-02-23 (×10): qty 1

## 2020-02-23 MED ORDER — CEFAZOLIN SODIUM-DEXTROSE 2-4 GM/100ML-% IV SOLN
2.0000 g | INTRAVENOUS | Status: AC
  Administered 2020-02-23: 2 g via INTRAVENOUS
  Filled 2020-02-23: qty 100

## 2020-02-23 MED ORDER — ATORVASTATIN CALCIUM 40 MG PO TABS
80.0000 mg | ORAL_TABLET | Freq: Every evening | ORAL | Status: DC
  Administered 2020-02-23 – 2020-02-28 (×6): 80 mg via ORAL
  Filled 2020-02-23 (×6): qty 2

## 2020-02-23 MED ORDER — POVIDONE-IODINE 10 % EX SWAB
2.0000 "application " | Freq: Once | CUTANEOUS | Status: AC
  Administered 2020-02-23: 2 via TOPICAL

## 2020-02-23 MED ORDER — ROPINIROLE HCL 1 MG PO TABS
4.0000 mg | ORAL_TABLET | Freq: Three times a day (TID) | ORAL | Status: DC | PRN
  Administered 2020-02-23 – 2020-02-24 (×2): 8 mg via ORAL
  Administered 2020-02-24: 4 mg via ORAL
  Administered 2020-02-25 – 2020-02-27 (×3): 8 mg via ORAL
  Administered 2020-02-28: 4 mg via ORAL
  Filled 2020-02-23 (×2): qty 4
  Filled 2020-02-23: qty 8
  Filled 2020-02-23: qty 4
  Filled 2020-02-23 (×5): qty 8

## 2020-02-23 MED ORDER — GLYCOPYRROLATE 0.2 MG/ML IJ SOLN
INTRAMUSCULAR | Status: DC | PRN
  Administered 2020-02-23: .2 mg via INTRAVENOUS

## 2020-02-23 MED ORDER — ACETAMINOPHEN 325 MG PO TABS: 325.0000 mg | ORAL_TABLET | Freq: Four times a day (QID) | ORAL | Status: DC | PRN

## 2020-02-23 MED ORDER — PHENOL 1.4 % MT LIQD: 1.0000 | OROMUCOSAL | Status: DC | PRN

## 2020-02-23 MED ORDER — HYDROMORPHONE HCL 1 MG/ML IJ SOLN
INTRAMUSCULAR | Status: AC
  Filled 2020-02-23: qty 1

## 2020-02-23 MED ORDER — SODIUM CHLORIDE 0.9 % IR SOLN
Status: DC | PRN
  Administered 2020-02-23: 6000 mL

## 2020-02-23 MED ORDER — VANCOMYCIN HCL 1500 MG/300ML IV SOLN
1500.0000 mg | INTRAVENOUS | Status: DC
  Administered 2020-02-24: 1500 mg via INTRAVENOUS
  Filled 2020-02-23: qty 300

## 2020-02-23 MED ORDER — METHOCARBAMOL 500 MG IVPB - SIMPLE MED
500.0000 mg | Freq: Four times a day (QID) | INTRAVENOUS | Status: DC | PRN
  Filled 2020-02-23: qty 50

## 2020-02-23 MED ORDER — PROPOFOL 500 MG/50ML IV EMUL
INTRAVENOUS | Status: DC | PRN
  Administered 2020-02-23: 125 ug/kg/min via INTRAVENOUS

## 2020-02-23 MED ORDER — OXYCODONE HCL 5 MG PO TABS: 5.0000 mg | ORAL_TABLET | Freq: Once | ORAL | Status: DC | PRN

## 2020-02-23 MED ORDER — PHENYLEPHRINE HCL-NACL 10-0.9 MG/250ML-% IV SOLN
INTRAVENOUS | Status: DC | PRN
  Administered 2020-02-23: 75 ug/min via INTRAVENOUS

## 2020-02-23 MED ORDER — ALUM & MAG HYDROXIDE-SIMETH 200-200-20 MG/5ML PO SUSP: 30.0000 mL | ORAL | Status: DC | PRN

## 2020-02-23 MED ORDER — OXYCODONE HCL 5 MG/5ML PO SOLN: 5.0000 mg | Freq: Once | ORAL | Status: DC | PRN

## 2020-02-23 MED ORDER — VANCOMYCIN HCL 1000 MG IV SOLR
INTRAVENOUS | Status: DC | PRN
  Administered 2020-02-23: 1000 mg via INTRAVENOUS

## 2020-02-23 MED ORDER — ISOPROPYL ALCOHOL 70 % SOLN
Status: AC
  Filled 2020-02-23: qty 480

## 2020-02-23 MED ORDER — METOCLOPRAMIDE HCL 5 MG/ML IJ SOLN: 5.0000 mg | Freq: Three times a day (TID) | INTRAMUSCULAR | Status: DC | PRN

## 2020-02-23 MED ORDER — METHOCARBAMOL 500 MG PO TABS
500.0000 mg | ORAL_TABLET | Freq: Four times a day (QID) | ORAL | Status: DC | PRN
  Administered 2020-02-24 – 2020-02-28 (×10): 500 mg via ORAL
  Filled 2020-02-23 (×11): qty 1

## 2020-02-23 MED ORDER — PROPOFOL 10 MG/ML IV BOLUS
INTRAVENOUS | Status: DC | PRN
  Administered 2020-02-23: 20 mg via INTRAVENOUS

## 2020-02-23 MED ORDER — POLYETHYLENE GLYCOL 3350 17 G PO PACK: 17.0000 g | PACK | Freq: Every day | ORAL | Status: DC | PRN

## 2020-02-23 MED ORDER — CEFAZOLIN SODIUM-DEXTROSE 2-4 GM/100ML-% IV SOLN
2.0000 g | Freq: Three times a day (TID) | INTRAVENOUS | Status: DC
  Administered 2020-02-23 – 2020-02-28 (×15): 2 g via INTRAVENOUS
  Filled 2020-02-23 (×15): qty 100

## 2020-02-23 MED ORDER — CHLORHEXIDINE GLUCONATE 0.12 % MT SOLN
15.0000 mL | Freq: Once | OROMUCOSAL | Status: AC
  Administered 2020-02-23: 15 mL via OROMUCOSAL

## 2020-02-23 MED ORDER — POTASSIUM CHLORIDE CRYS ER 10 MEQ PO TBCR
10.0000 meq | EXTENDED_RELEASE_TABLET | Freq: Two times a day (BID) | ORAL | Status: DC
  Administered 2020-02-23: 10 meq via ORAL
  Filled 2020-02-23: qty 1

## 2020-02-23 MED ORDER — CARVEDILOL 6.25 MG PO TABS
6.2500 mg | ORAL_TABLET | Freq: Two times a day (BID) | ORAL | Status: DC
  Administered 2020-02-23 – 2020-02-26 (×6): 6.25 mg via ORAL
  Filled 2020-02-23 (×6): qty 1

## 2020-02-23 MED ORDER — CHLORHEXIDINE GLUCONATE CLOTH 2 % EX PADS
6.0000 | MEDICATED_PAD | Freq: Every day | CUTANEOUS | Status: DC
  Administered 2020-02-24 – 2020-02-27 (×4): 6 via TOPICAL

## 2020-02-23 MED ORDER — MUPIROCIN 2 % EX OINT
1.0000 "application " | TOPICAL_OINTMENT | Freq: Two times a day (BID) | CUTANEOUS | Status: AC
  Administered 2020-02-23 – 2020-02-28 (×10): 1 via NASAL
  Filled 2020-02-23: qty 22

## 2020-02-23 MED ORDER — LACTATED RINGERS IV SOLN: INTRAVENOUS | Status: DC

## 2020-02-23 MED ORDER — MIDAZOLAM HCL 2 MG/2ML IJ SOLN
INTRAMUSCULAR | Status: AC
  Filled 2020-02-23: qty 2

## 2020-02-23 MED ORDER — INSULIN ASPART 100 UNIT/ML ~~LOC~~ SOLN
0.0000 [IU] | Freq: Three times a day (TID) | SUBCUTANEOUS | Status: DC
  Administered 2020-02-24 (×2): 1 [IU] via SUBCUTANEOUS
  Administered 2020-02-24: 2 [IU] via SUBCUTANEOUS
  Administered 2020-02-25 (×3): 1 [IU] via SUBCUTANEOUS

## 2020-02-23 MED ORDER — POVIDONE-IODINE 10 % EX SWAB: 2.0000 "application " | Freq: Once | CUTANEOUS | Status: DC

## 2020-02-23 MED ORDER — ONDANSETRON HCL 4 MG PO TABS: 4.0000 mg | ORAL_TABLET | Freq: Four times a day (QID) | ORAL | Status: DC | PRN

## 2020-02-23 MED ORDER — ACETAMINOPHEN 10 MG/ML IV SOLN
1000.0000 mg | Freq: Once | INTRAVENOUS | Status: AC
  Administered 2020-02-23: 1000 mg via INTRAVENOUS
  Filled 2020-02-23: qty 100

## 2020-02-23 MED ORDER — IRRISEPT - 450ML BOTTLE WITH 0.05% CHG IN STERILE WATER, USP 99.95% OPTIME
TOPICAL | Status: DC | PRN
  Administered 2020-02-23: 450 mL

## 2020-02-23 MED ORDER — METOCLOPRAMIDE HCL 5 MG PO TABS: 5.0000 mg | ORAL_TABLET | Freq: Three times a day (TID) | ORAL | Status: DC | PRN

## 2020-02-23 MED ORDER — PROPOFOL 1000 MG/100ML IV EMUL
INTRAVENOUS | Status: AC
  Filled 2020-02-23: qty 100

## 2020-02-23 MED ORDER — MENTHOL 3 MG MT LOZG: 1.0000 | LOZENGE | OROMUCOSAL | Status: DC | PRN

## 2020-02-23 MED ORDER — DIAZEPAM 5 MG PO TABS
5.0000 mg | ORAL_TABLET | Freq: Two times a day (BID) | ORAL | Status: DC | PRN
  Administered 2020-02-23 – 2020-02-27 (×5): 5 mg via ORAL
  Filled 2020-02-23 (×5): qty 1

## 2020-02-23 MED ORDER — ISOPROPYL ALCOHOL 70 % SOLN
Status: DC | PRN
  Administered 2020-02-23: 1 via TOPICAL

## 2020-02-23 MED ORDER — OXYCODONE HCL 5 MG PO TABS
10.0000 mg | ORAL_TABLET | ORAL | Status: DC | PRN
  Administered 2020-02-23 (×2): 10 mg via ORAL
  Administered 2020-02-24 – 2020-02-28 (×19): 15 mg via ORAL
  Filled 2020-02-23 (×19): qty 3

## 2020-02-23 MED ORDER — DIPHENHYDRAMINE HCL 12.5 MG/5ML PO ELIX: 12.5000 mg | ORAL_SOLUTION | ORAL | Status: DC | PRN

## 2020-02-23 MED ORDER — TRANEXAMIC ACID-NACL 1000-0.7 MG/100ML-% IV SOLN
1000.0000 mg | INTRAVENOUS | Status: AC
  Administered 2020-02-23: 1000 mg via INTRAVENOUS
  Filled 2020-02-23: qty 100

## 2020-02-23 MED ORDER — ONDANSETRON HCL 4 MG/2ML IJ SOLN: 4.0000 mg | Freq: Four times a day (QID) | INTRAMUSCULAR | Status: DC | PRN

## 2020-02-23 MED ORDER — HYDROMORPHONE HCL 1 MG/ML IJ SOLN
0.5000 mg | INTRAMUSCULAR | Status: DC | PRN
  Administered 2020-02-23 – 2020-02-28 (×10): 1 mg via INTRAVENOUS
  Filled 2020-02-23 (×11): qty 1

## 2020-02-23 MED ORDER — FENTANYL CITRATE (PF) 100 MCG/2ML IJ SOLN
INTRAMUSCULAR | Status: AC
  Filled 2020-02-23: qty 2

## 2020-02-23 MED ORDER — BUPIVACAINE HCL 0.25 % IJ SOLN
INTRAMUSCULAR | Status: AC
  Filled 2020-02-23: qty 1

## 2020-02-23 MED ORDER — VANCOMYCIN HCL IN DEXTROSE 1-5 GM/200ML-% IV SOLN
INTRAVENOUS | Status: AC
  Filled 2020-02-23: qty 200

## 2020-02-23 MED ORDER — LACTATED RINGERS IV SOLN: INTRAVENOUS | Status: DC | PRN

## 2020-02-23 MED ORDER — DEXAMETHASONE SODIUM PHOSPHATE 10 MG/ML IJ SOLN
10.0000 mg | Freq: Once | INTRAMUSCULAR | Status: AC
  Administered 2020-02-24: 10 mg via INTRAVENOUS
  Filled 2020-02-23: qty 1

## 2020-02-23 MED ORDER — HYDROMORPHONE HCL 1 MG/ML IJ SOLN
0.2500 mg | INTRAMUSCULAR | Status: DC | PRN
  Administered 2020-02-23 (×4): 0.5 mg via INTRAVENOUS

## 2020-02-23 MED ORDER — ASPIRIN 81 MG PO CHEW
81.0000 mg | CHEWABLE_TABLET | Freq: Two times a day (BID) | ORAL | Status: DC
  Administered 2020-02-23 – 2020-02-28 (×10): 81 mg via ORAL
  Filled 2020-02-23 (×10): qty 1

## 2020-02-23 MED ORDER — FUROSEMIDE 40 MG PO TABS
40.0000 mg | ORAL_TABLET | Freq: Two times a day (BID) | ORAL | Status: DC
  Administered 2020-02-23 – 2020-02-25 (×5): 40 mg via ORAL
  Filled 2020-02-23 (×5): qty 1

## 2020-02-23 MED ORDER — ALLOPURINOL 300 MG PO TABS
300.0000 mg | ORAL_TABLET | Freq: Two times a day (BID) | ORAL | Status: DC
  Administered 2020-02-23 – 2020-02-28 (×10): 300 mg via ORAL
  Filled 2020-02-23 (×10): qty 1

## 2020-02-23 MED ORDER — METHOCARBAMOL 500 MG IVPB - SIMPLE MED
INTRAVENOUS | Status: AC
  Administered 2020-02-23: 500 mg via INTRAVENOUS
  Filled 2020-02-23: qty 50

## 2020-02-23 MED ORDER — LISINOPRIL 20 MG PO TABS
20.0000 mg | ORAL_TABLET | Freq: Every day | ORAL | Status: DC
  Administered 2020-02-24 – 2020-02-25 (×2): 20 mg via ORAL
  Filled 2020-02-23 (×2): qty 1

## 2020-02-23 SURGICAL SUPPLY — 71 items
BAG ZIPLOCK 12X15 (MISCELLANEOUS) ×2 IMPLANT
CANISTER WOUND CARE 500ML ATS (WOUND CARE) ×2 IMPLANT
CHLORAPREP W/TINT 26 (MISCELLANEOUS) ×2 IMPLANT
CNTNR URN SCR LID CUP LEK RST (MISCELLANEOUS) ×1 IMPLANT
CONT SPEC 4OZ STRL OR WHT (MISCELLANEOUS) ×1
COVER SURGICAL LIGHT HANDLE (MISCELLANEOUS) ×2 IMPLANT
DRAPE IMP U-DRAPE 54X76 (DRAPES) ×2 IMPLANT
DRAPE ORTHO SPLIT 77X108 STRL (DRAPES)
DRAPE SHEET LG 3/4 BI-LAMINATE (DRAPES) ×6 IMPLANT
DRAPE STERI IOBAN 125X83 (DRAPES) ×2 IMPLANT
DRAPE SURG ORHT 6 SPLT 77X108 (DRAPES) IMPLANT
DRAPE U-SHAPE 47X51 STRL (DRAPES) ×4 IMPLANT
DRESSING PEEL AND PLAC PRVNA20 (GAUZE/BANDAGES/DRESSINGS) IMPLANT
DRESSING PEEL AND PLC PRVNA 13 (GAUZE/BANDAGES/DRESSINGS) IMPLANT
DRESSING PREVENA PLUS CUSTOM (GAUZE/BANDAGES/DRESSINGS) ×1 IMPLANT
DRSG AQUACEL AG ADV 3.5X10 (GAUZE/BANDAGES/DRESSINGS) IMPLANT
DRSG PEEL AND PLACE PREVENA 13 (GAUZE/BANDAGES/DRESSINGS)
DRSG PEEL AND PLACE PREVENA 20 (GAUZE/BANDAGES/DRESSINGS)
DRSG PREVENA PLUS CUSTOM (GAUZE/BANDAGES/DRESSINGS) ×2
ELECT BLADE TIP CTD 4 INCH (ELECTRODE) ×2 IMPLANT
ELECT REM PT RETURN 15FT ADLT (MISCELLANEOUS) ×2 IMPLANT
EVACUATOR 1/8 PVC DRAIN (DRAIN) ×2 IMPLANT
EVACUATOR DRAINAGE 10X20 100CC (DRAIN) ×1 IMPLANT
EVACUATOR SILICONE 100CC (DRAIN) ×1
FACESHIELD WRAPAROUND (MASK) ×4 IMPLANT
GAUZE SPONGE 2X2 8PLY STRL LF (GAUZE/BANDAGES/DRESSINGS) ×1 IMPLANT
GLOVE BIO SURGEON STRL SZ8.5 (GLOVE) ×6 IMPLANT
GLOVE BIOGEL M STRL SZ7.5 (GLOVE) ×4 IMPLANT
GLOVE BIOGEL PI IND STRL 8 (GLOVE) ×1 IMPLANT
GLOVE BIOGEL PI IND STRL 8.5 (GLOVE) ×1 IMPLANT
GLOVE BIOGEL PI INDICATOR 8 (GLOVE) ×1
GLOVE BIOGEL PI INDICATOR 8.5 (GLOVE) ×1
GOWN SPEC L3 XXLG W/TWL (GOWN DISPOSABLE) ×2 IMPLANT
HANDPIECE INTERPULSE COAX TIP (DISPOSABLE) ×1
HEAD FEM SROM 32 +6 (Hips) ×2 IMPLANT
HOOD PEEL AWAY FLYTE STAYCOOL (MISCELLANEOUS) ×6 IMPLANT
JET LAVAGE IRRISEPT WOUND (IRRIGATION / IRRIGATOR) ×2
KIT DRSG PREVENA PLUS 7DAY 125 (MISCELLANEOUS) IMPLANT
KIT TURNOVER KIT A (KITS) IMPLANT
LAVAGE JET IRRISEPT WOUND (IRRIGATION / IRRIGATOR) ×1 IMPLANT
MANIFOLD NEPTUNE II (INSTRUMENTS) ×2 IMPLANT
MARKER SKIN DUAL TIP RULER LAB (MISCELLANEOUS) ×2 IMPLANT
NDL SAFETY ECLIPSE 18X1.5 (NEEDLE) IMPLANT
NEEDLE HYPO 18GX1.5 SHARP (NEEDLE)
NEEDLE SPNL 18GX3.5 QUINCKE PK (NEEDLE) IMPLANT
NS IRRIG 1000ML POUR BTL (IV SOLUTION) ×2 IMPLANT
PACK TOTAL JOINT (CUSTOM PROCEDURE TRAY) IMPLANT
PENCIL SMOKE EVACUATOR (MISCELLANEOUS) IMPLANT
PIN ALTRX NEUTRAL X OD 32X52 (Pin) ×2 IMPLANT
SET HNDPC FAN SPRY TIP SCT (DISPOSABLE) ×1 IMPLANT
SPONGE GAUZE 2X2 STER 10/PKG (GAUZE/BANDAGES/DRESSINGS) ×1
SPONGE LAP 18X18 RF (DISPOSABLE) ×2 IMPLANT
STAPLER VISISTAT 35W (STAPLE) IMPLANT
SUT ETHILON 2 0 PS N (SUTURE) ×2 IMPLANT
SUT ETHILON 2 0 PSLX (SUTURE) ×6 IMPLANT
SUT ETHILON 3 0 PS 1 (SUTURE) ×2 IMPLANT
SUT MNCRL AB 3-0 PS2 18 (SUTURE) IMPLANT
SUT MNCRL AB 4-0 PS2 18 (SUTURE) IMPLANT
SUT MON AB 2-0 CT1 36 (SUTURE) ×4 IMPLANT
SUT PDS AB 1 CT1 27 (SUTURE) ×2 IMPLANT
SUT STRATAFIX PDO 1 14 VIOLET (SUTURE) ×1
SUT STRATFX PDO 1 14 VIOLET (SUTURE) ×1
SUT VIC AB 2-0 CT1 27 (SUTURE)
SUT VIC AB 2-0 CT1 TAPERPNT 27 (SUTURE) IMPLANT
SUTURE STRATFX PDO 1 14 VIOLET (SUTURE) ×1 IMPLANT
SWAB COLLECTION DEVICE MRSA (MISCELLANEOUS) ×4 IMPLANT
SWAB CULTURE ESWAB REG 1ML (MISCELLANEOUS) ×4 IMPLANT
SYR 3ML LL SCALE MARK (SYRINGE) ×2 IMPLANT
TRAY FOLEY MTR SLVR 16FR STAT (SET/KITS/TRAYS/PACK) IMPLANT
TRAY PREP A LATEX SAFE STRL (SET/KITS/TRAYS/PACK) ×2 IMPLANT
TUBE SUCTION HIGH CAP CLEAR NV (SUCTIONS) ×2 IMPLANT

## 2020-02-23 NOTE — Anesthesia Postprocedure Evaluation (Signed)
Anesthesia Post Note  Patient: [name]  Procedure(s) Performed: IRRIGATION AND DEBRIDEMENT left hip, HEAD BALL AND LINER CHANGE (Left )     Patient location during evaluation: PACU Anesthesia Type: Spinal and MAC Level of consciousness: awake and alert Pain management: pain level controlled Vital Signs Assessment: post-procedure vital signs reviewed and stable Respiratory status: spontaneous breathing, nonlabored ventilation and respiratory function stable Cardiovascular status: blood pressure returned to baseline and stable Postop Assessment: no apparent nausea or vomiting Anesthetic complications: no   No complications documented.  Last Vitals:  Vitals:   02/23/20 1715 02/23/20 1730  BP: 120/79 123/73  Pulse: 95 94  Resp: (!) 8 (!) 9  Temp:    SpO2: 99% 100%    Last Pain:  Vitals:   02/23/20 1730  TempSrc:   PainSc: Asleep    LLE Motor Response: Purposeful movement (02/23/20 1730) LLE Sensation: Decreased;Numbness (02/23/20 1730) RLE Motor Response: Purposeful movement (02/23/20 1730) RLE Sensation: Decreased;Numbness (02/23/20 1730) L Sensory Level: L4-Anterior knee, lower leg (02/23/20 1730) R Sensory Level: L4-Anterior knee, lower leg (02/23/20 1730)  Justin Hall

## 2020-02-23 NOTE — Anesthesia Preprocedure Evaluation (Signed)
Anesthesia Evaluation  Patient identified by MRN, date of birth, ID band Patient awake    Reviewed: Allergy & Precautions, NPO status , Patient's Chart, lab work & pertinent test results  Airway Mallampati: II  TM Distance: >3 FB Neck ROM: Full    Dental   Pulmonary sleep apnea ,    Pulmonary exam normal        Cardiovascular hypertension, Pt. on medications Normal cardiovascular exam     Neuro/Psych Anxiety Depression    GI/Hepatic   Endo/Other  diabetes, Type 2  Renal/GU      Musculoskeletal   Abdominal   Peds  Hematology   Anesthesia Other Findings   Reproductive/Obstetrics                             Anesthesia Physical Anesthesia Plan  ASA: III  Anesthesia Plan: Spinal   Post-op Pain Management:    Induction: Intravenous  PONV Risk Score and Plan: 1 and Ondansetron and Treatment may vary due to age or medical condition  Airway Management Planned: Nasal Cannula  Additional Equipment:   Intra-op Plan:   Post-operative Plan:   Informed Consent: I have reviewed the patients History and Physical, chart, labs and discussed the procedure including the risks, benefits and alternatives for the proposed anesthesia with the patient or authorized representative who has indicated his/her understanding and acceptance.       Plan Discussed with: CRNA and Surgeon  Anesthesia Plan Comments:         Anesthesia Quick Evaluation
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
## 2020-02-23 NOTE — Transfer of Care (Signed)
Immediate Anesthesia Transfer of Care Note  Patient: Justin Hall  Procedure(s) Performed: IRRIGATION AND DEBRIDEMENT left hip, HEAD BALL AND LINER CHANGE (Left )  Patient Location: PACU  Anesthesia Type:Spinal  Level of Consciousness: awake, alert  and oriented  Airway & Oxygen Therapy: Patient Spontanous Breathing and Patient connected to face mask oxygen  Post-op Assessment: Report given to RN and Post -op Vital signs reviewed and stable  Post vital signs: Reviewed and stable  Last Vitals:  Vitals Value Taken Time  BP 117/83 02/23/20 1636  Temp    Pulse 102 02/23/20 1637  Resp 13 02/23/20 1637  SpO2 92 % 02/23/20 1637  Vitals shown include unvalidated device data.  Last Pain:  Vitals:   02/23/20 1029  TempSrc:   PainSc: 8       Patients Stated Pain Goal: 7 (32/25/67 2091)  Complications: No complications documented.

## 2020-02-23 NOTE — Op Note (Signed)
OPERATIVE REPORT   SURGEON: Rod Can, MD   ASSISTANT: Cherlynn June, A-C.  PREOPERATIVE DIAGNOSIS: Periprosthetic joint infection, left total hip arthroplasty.  POSTOPERATIVE DIAGNOSIS:  Periprosthetic joint infection, left total hip arthroplasty.  PROCEDURE: Irrigation and debridement left total hip arthroplasty with head ball and liner exchange.  IMPLANTS:  DePuy Altrx liner, size 32 by 52 mm, +4 neutral. DePuy metal head ball, size 32 + 6 mm.  EXPLANTS:  DePuy Altrx liner, size 32 by 52 mm, +4 neutral. DePuy Biolox ceramic head ball, size 32 + 3 mm.  ANESTHESIA:  MAC and Spinal  ESTIMATED BLOOD LOSS:-1000 mL    ANTIBIOTICS: 3 g Ancef. 1 g vancomycin.  DRAINS: Medium Hemovac x1 in the left hip joint. 10 mm flat JP drain in the subcutaneous tissue x1. Negative pressure incisional VAC dressing.  SPECIMENS: Left hip fluid collection for aerobic and anaerobic culture. Left hip femoral interface for tissue culture.  COMPLICATIONS: None.   CONDITION: PACU - hemodynamically stable.    BRIEF CLINICAL NOTE: Justin Hall is a 54 y.o. male who underwent primary left total hip arthroplasty by Dr. Gaynelle Arabian about 4 weeks ago.  He has had an apparent hematoma for a couple of weeks and recently developed drainage from the inferior aspect of the wound.  Yesterday, he was sent for fluoroscopic aspiration of the left hip, and no fluid was able to be obtained.  He was then indicated for exploration and debridement of the wound, possible head ball and liner exchange.  The risks, benefits, and alternatives to the procedure were explained, and the patient elected to proceed.  PROCEDURE IN DETAIL: Surgical site was marked by myself. Once inside the operative room, spinal anesthesia was induced and a foley catheter was inserted. The patient was then positioned in the lateral decubitus  position. All bony prominences were well padded. The hip was prepped and draped in the normal sterile surgical fashion. A time-out was called verifying side and site of surgery. The patient received IV antibiotics within 60 minutes of beginning the procedure.  Using a #10 blade, I ellipsed the previous incision in a full-thickness manner.  I fully excised the draining tract distally.  Once I was into the subcutaneous tissue, there was an obvious large defect in the fascia distally with seropurulent fluid emanating from the hip joint.  Swab was sent of this fluid for culture.  All identified suture material was removed. I then incised the IT band.  Using blunt digital dissection, I dissected through the posterior capsule and external rotators.  I removed Ethibond suture material from the posterior capsular repair.  The sciatic nerve was palpated and protected throughout the case.  The hip was then gently dislocated using a bone hook.  Femoral exposure was achieved, and I removed the head ball with a bone tamp.  The trunnion was intact without any damage.  Acetabular exposure was achieved.  The pericapsular tissue was very inflamed.  There was seropurulent fluid within the joint.  I excisionally debrided capsular and scar material from around the periphery of the cup.  The polyliner extractor device was used to easily remove the liner.  The cup was well fixed.  I debrided the screw holes within the cup with a curette.  The cup was tested and found to be well fixed.    I then achieved exposure of the proximal femur.  There was some inflamed scar tissue around the proximal femoral neck, and I excisionally debrided this with a curette.  A  representative sample was sent for tissue culture.  After I was satisfied with my debridement, I then irrigated the hip with irrisept solution followed by 6 L of saline using pulse lavage.  Acetabular retractors were replaced, and the real polyliner was impacted.  +3 trial  head was then used and the hip was reduced.  Tension and stability were not adequate.  I then placed a +6 mm head.  Stability testing was performed and found to be excellent.  The hip was dislocated and trial components were removed. The final implants were placed, and the hip was reduced. Leg lengths were palpated. At neutral abduction and 90 degrees of flexion, the hip was stable to 80 degrees of internal rotation. She was stable in potty position and in full extension / external rotation. Soft tissue tension was appropriate.  The hip was irrigated again with normal saline using pulse lavage.  Hemovac drain was placed within the hip joint and brought out distally in line with the incision.  The wound was closed in layers using #1 PDS and strata fix for the fascia, 2-0 Monocryl for the deep dermal layer, and 2-0 nylon vertical mattress sutures for the skin. Prior to closure of the skin, stab incision was made with a #10 blade distal to the incision, and a 10 mm flat JP drain was placed into the subcutaneous tissue.  Incisional negative pressure dressing was applied according to manufacturer's instructions.  Suction was hooked up to 125 mmHg without any leak.  The patient was transported to the recovery room in stable condition. Sponge, needle, and instrument counts were correct at the end of the case x2. The patient tolerated the procedure well and there were no known complications.  POSTOPERATIVE PLAN: Patient will be admitted to the hospital.  He may weight-bear as tolerated left lower extremity.  Posterior hip precautions.  Abduction brace has been ordered from [facility].  For now, we will keep him on Ancef and vancomycin until cultures become available.  Aspirin for DVT prophylaxis.  Mobilize out of bed with PT/OT.  Once cultures are available, we will obtain an infectious disease consult.  He will likely need a PICC line for 6 weeks of IV antibiotics.  JP drain and Hemovac drain output will  be monitored.  Upon discharge, we will convert house VAC unit to portable suction unit.

## 2020-02-23 NOTE — Progress Notes (Signed)
Lab reports MRSA screen is positive

## 2020-02-23 NOTE — Progress Notes (Signed)
Pharmacy Antibiotic Note  Justin Hall is a 54 y.o. male admitted on 02/23/2020 with prosthetic joint infection.  Pharmacy has been consulted for Vancomycin dosing.  He has received Vancomycin 1g and Cefazolin 2g preop doses. SCr 1.85, elevated, baseline SCr ~ 1.32 (11/2019) WBC 9.5 Afebrile  Plan: Vancomycin 1500 mg IV q24h.  Measure Vanc trough at steady state. Follow up renal function, culture results, and clinical course.     Height: 5' 9.5" (176.5 cm) Weight: (!) 136.3 kg (300 lb 8 oz) IBW/kg (Calculated) : 71.85  Temp (24hrs), Avg:97.9 F (36.6 C), Min:97.4 F (36.3 C), Max:98.5 F (36.9 C)  Recent Labs  Lab 02/23/20 1045  WBC 9.5  CREATININE 1.85*    Estimated Creatinine Clearance: 61.6 mL/min (A) (by C-G formula based on SCr of 1.85 mg/dL (H)).    No Known Allergies  Antimicrobials this admission: 11/4 Cefazolin >> 11/4 Vancomycin >>  Dose adjustments this admission:   Microbiology results: 11/4 Surgical culture:    Thank you for allowing pharmacy to be a part of this patients care. Gretta Arab PharmD, BCPS Clinical Pharmacist [facility] [phone] 02/23/2020 6:20 PM

## 2020-02-23 NOTE — Plan of Care (Signed)
  Problem: Education: Goal: Knowledge of the prescribed therapeutic regimen will improve Outcome: Progressing   Problem: Pain Management: Goal: Pain level will decrease with appropriate interventions Outcome: Progressing   Problem: Skin Integrity: Goal: Will show signs of wound healing Outcome: Progressing   

## 2020-02-24 LAB — SEDIMENTATION RATE: Sed Rate: 115 mm/hr — ABNORMAL HIGH (ref 0–16)

## 2020-02-24 LAB — POCT I-STAT, CHEM 8
BUN: 27 mg/dL — ABNORMAL HIGH (ref 6–20)
Calcium, Ion: 1.26 mmol/L (ref 1.15–1.40)
Chloride: 99 mmol/L (ref 98–111)
Creatinine, Ser: 1.8 mg/dL — ABNORMAL HIGH (ref 0.61–1.24)
Glucose, Bld: 133 mg/dL — ABNORMAL HIGH (ref 70–99)
HCT: 28 % — ABNORMAL LOW (ref 39.0–52.0)
Hemoglobin: 9.5 g/dL — ABNORMAL LOW (ref 13.0–17.0)
Potassium: 4.7 mmol/L (ref 3.5–5.1)
Sodium: 137 mmol/L (ref 135–145)
TCO2: 31 mmol/L (ref 22–32)

## 2020-02-24 LAB — CBC
HCT: 22.8 % — ABNORMAL LOW (ref 39.0–52.0)
Hemoglobin: 7.1 g/dL — ABNORMAL LOW (ref 13.0–17.0)
MCH: 25.2 pg — ABNORMAL LOW (ref 26.0–34.0)
MCHC: 31.1 g/dL (ref 30.0–36.0)
MCV: 80.9 fL (ref 80.0–100.0)
Platelets: 477 10*3/uL — ABNORMAL HIGH (ref 150–400)
RBC: 2.82 MIL/uL — ABNORMAL LOW (ref 4.22–5.81)
RDW: 14.5 % (ref 11.5–15.5)
WBC: 13 10*3/uL — ABNORMAL HIGH (ref 4.0–10.5)
nRBC: 0 % (ref 0.0–0.2)

## 2020-02-24 LAB — BASIC METABOLIC PANEL
Anion gap: 11 (ref 5–15)
BUN: 34 mg/dL — ABNORMAL HIGH (ref 6–20)
CO2: 24 mmol/L (ref 22–32)
Calcium: 8.2 mg/dL — ABNORMAL LOW (ref 8.9–10.3)
Chloride: 99 mmol/L (ref 98–111)
Creatinine, Ser: 2.34 mg/dL — ABNORMAL HIGH (ref 0.61–1.24)
GFR, Estimated: 32 mL/min — ABNORMAL LOW (ref >60)
Glucose, Bld: 163 mg/dL — ABNORMAL HIGH (ref 70–99)
Potassium: 5.3 mmol/L — ABNORMAL HIGH (ref 3.5–5.1)
Sodium: 134 mmol/L — ABNORMAL LOW (ref 135–145)

## 2020-02-24 LAB — GLUCOSE, CAPILLARY
Glucose-Capillary: 137 mg/dL — ABNORMAL HIGH (ref 70–99)
Glucose-Capillary: 192 mg/dL — ABNORMAL HIGH (ref 70–99)
Glucose-Capillary: 150 mg/dL — ABNORMAL HIGH (ref 70–99)
Glucose-Capillary: 151 mg/dL — ABNORMAL HIGH (ref 70–99)

## 2020-02-24 LAB — PREPARE RBC (CROSSMATCH)

## 2020-02-24 LAB — C-REACTIVE PROTEIN: CRP: 9.4 mg/dL — ABNORMAL HIGH (ref <1.0)

## 2020-02-24 MED ORDER — JUVEN PO PACK
1.0000 | PACK | Freq: Two times a day (BID) | ORAL | Status: DC
  Administered 2020-02-24 – 2020-02-27 (×7): 1 via ORAL
  Filled 2020-02-24 (×10): qty 1

## 2020-02-24 MED ORDER — SODIUM CHLORIDE 0.9% IV SOLUTION: Freq: Once | INTRAVENOUS | Status: AC

## 2020-02-24 MED ORDER — ENSURE MAX PROTEIN PO LIQD
11.0000 [oz_av] | Freq: Every day | ORAL | Status: DC
  Administered 2020-02-25 – 2020-02-28 (×3): 11 [oz_av] via ORAL
  Filled 2020-02-24 (×4): qty 330

## 2020-02-24 MED ORDER — VANCOMYCIN HCL IN DEXTROSE 1-5 GM/200ML-% IV SOLN
1000.0000 mg | INTRAVENOUS | Status: DC
  Administered 2020-02-25 – 2020-02-26 (×2): 1000 mg via INTRAVENOUS
  Filled 2020-02-24 (×2): qty 200

## 2020-02-24 NOTE — Progress Notes (Signed)
Orthopedic Tech Progress Note Patient Details:  Justin Hall June 13, 1965 [id number]  Patient ID: [name], male   DOB: [birth year], 54 y.o.   MRN: [id number]   [place] Pink 02/24/2020, 7:41 AMCalled and routed Hip Abduction brace order to [facility].

## 2020-02-24 NOTE — Evaluation (Signed)
Physical Therapy Evaluation Patient Details Name: Justin Hall MRN: [id number] DOB: 21-Dec-1965 Today's Date: 02/24/2020   History of Present Illness  Patient is 54 y.o. male admitted with L THR peri-prothetic infection and now s/p I&D.  Pt s/p Lt THA via posterolateral approach on 01/23/20 with PMH significant for OA, HTN, DM, CHF, depression, anxiety.  Clinical Impression  Pt admitted as above and presenting with functional mobility limitations 2* decreased L LE strength/ROM, post op pain, posterior THP, and limitations of abductor brace.  Pt should progress to dc home with family assist.    Follow Up Recommendations Follow surgeon's recommendation for DC plan and follow-up therapies;Other (comment)    Equipment Recommendations  None recommended by PT    Recommendations for Other Services OT consult     Precautions / Restrictions Precautions Precautions: Fall;Posterior Hip Precaution Comments: pt recalled 2/3 THP so reducated pt and spouse Restrictions Weight Bearing Restrictions: No Other Position/Activity Restrictions: WBAT      Mobility  Bed Mobility Overal bed mobility: Needs Assistance Bed Mobility: Supine to Sit     Supine to sit: Min assist;Mod assist     General bed mobility comments: Increased time and use of bed rails.  Cues for sequence and use of R LE to self assist    Transfers Overall transfer level: Needs assistance Equipment used: Rolling walker (2 wheeled) Transfers: Sit to/from [employer] Transfers Sit to Stand: Min assist;Mod assist;From elevated surface         General transfer comment: cues for LE management and use of UEs to self assist  Ambulation/Gait Ambulation/Gait assistance: Min assist Gait Distance (Feet): 30 Feet Assistive device: Rolling walker (2 wheeled) Gait Pattern/deviations: Step-to pattern;Decreased stride length;Decreased weight shift to left Gait velocity: decreased   General Gait Details: cues for sequence,  posture and position from [employer]            Wheelchair Mobility    Modified Rankin (Stroke Patients Only)       Balance Overall balance assessment: Needs assistance Sitting-balance support: Feet supported;No upper extremity supported Sitting balance-Leahy Scale: Good     Standing balance support: During functional activity;Bilateral upper extremity supported Standing balance-Leahy Scale: Poor                               Pertinent Vitals/Pain Pain Assessment: 0-10 Pain Score: 7  Pain Location: LEFT hip/buttock Pain Descriptors / Indicators: Grimacing;Tender;Operative site guarding;Sore Pain Intervention(s): Limited activity within patient's tolerance;Monitored during session;Premedicated before session    [place] expects to be discharged to:: Private residence Living Arrangements: Spouse/significant other;Children Available Help at Discharge: Family Type of Home: House Home Access: Stairs to enter Entrance Stairs-Rails: Left Entrance Stairs-Number of Steps: 4 Home Layout: One level Home Equipment: Environmental consultant - 2 wheels;Cane - single point;Bedside commode Additional Comments: pt plans on staying with his friend/neighbor for the first five days. it is fully handicapp accessible home. no stairs with walk in shower and bathroom has grab bars everywhere.    Prior Function Level of Independence: Independent with assistive device(s)         Comments: Pt using cane      Hand Dominance   Dominant Hand: Right    Extremity/Trunk Assessment   Upper Extremity Assessment Upper Extremity Assessment: Overall WFL for tasks assessed    Lower Extremity Assessment Lower Extremity Assessment: LLE deficits/detail LLE Deficits / Details: L abductor brace in position LLE: Unable to fully  assess due to pain;Unable to fully assess due to immobilization    Cervical / Trunk Assessment Cervical / Trunk Assessment: Other exceptions Cervical /  Trunk Exceptions: habitus  Communication   Communication: No difficulties  Cognition Arousal/Alertness: Awake/alert Behavior During Therapy: WFL for tasks assessed/performed Overall Cognitive Status: Within Functional Limits for tasks assessed                                 General Comments: AxIO x 3 very motivated      General Comments      Exercises     Assessment/Plan    PT Assessment Patient needs continued PT services  PT Problem List Decreased range of motion;Decreased strength;Decreased activity tolerance;Decreased balance;Decreased mobility;Decreased knowledge of use of DME       PT Treatment Interventions DME instruction;Gait training;Stair training;Functional mobility training;Therapeutic activities;Therapeutic exercise;Balance training;Patient/family education    PT Goals (Current goals can be found in the Care Plan section)  Acute Rehab PT Goals Patient Stated Goal: stop hurting and get home PT Goal Formulation: With patient/family Time For Goal Achievement: 01/30/20 Potential to Achieve Goals: Good    Frequency 7X/week   Barriers to discharge        Co-evaluation               AM-PAC PT "6 Clicks" Mobility  Outcome Measure Help needed turning from your back to your side while in a flat bed without using bedrails?: A Lot Help needed moving from lying on your back to sitting on the side of a flat bed without using bedrails?: A Lot Help needed moving to and from a bed to a chair (including a wheelchair)?: A Lot Help needed standing up from a chair using your arms (e.g., wheelchair or bedside chair)?: A Lot Help needed to walk in hospital room?: A Little Help needed climbing 3-5 steps with a railing? : A Lot 6 Click Score: 13    End of Session Equipment Utilized During Treatment: Gait belt Activity Tolerance: Patient tolerated treatment well;Patient limited by pain Patient left: in chair;with call bell/phone within reach;with  family/visitor present Nurse Communication: Mobility status PT Visit Diagnosis: Muscle weakness (generalized) (M62.81);Difficulty in walking, not elsewhere classified (R26.2)    Time: 1030-1105 PT Time Calculation (min) (ACUTE ONLY): 35 min   Charges:   PT Evaluation $PT Eval Low Complexity: 1 Low PT Treatments $Gait Training: 8-22 mins        [place] Pager [phone] Office [phone]   Kyler Lerette 02/24/2020, 1:40 PM

## 2020-02-24 NOTE — TOC Initial Note (Signed)
Transition of Care [facility]) - Initial/Assessment Note    Patient Details  Name: Justin Hall MRN: [id number] Date of Birth: 07-Feb-1966  Transition [facility]) CM/SW Contact:    Lennart Pall, LCSW Phone Number: 02/24/2020, 11:19 AM  Clinical Narrative:                 Met with pt and wife to introduce self and role.  Very pleasant and eager to hear from MD about plan for home.  Pt admits frustration with overall situation but remains motivated.  Briefly reviewed possibility of home abx management, however, awaiting final decision.  Will continue to follow for dc needs.  Wife very supportive and able to provide assistance at dc.  Expected Discharge Plan: [place] Barriers to Discharge: Continued Medical Work up   Patient Goals and CMS Choice Patient states their goals for this hospitalization and ongoing recovery are:: return home      Expected Discharge Plan and Services Expected Discharge Plan: [place] In-house Referral: Clinical Social Work     Living arrangements for the past 2 months: Single Family Home                                      Prior Living Arrangements/Services Living arrangements for the past 2 months: Single Family Home Lives with:: Spouse Patient language and need for interpreter reviewed:: Yes Do you feel safe going back to the place where you live?: Yes      Need for Family Participation in Patient Care: Yes (Comment) Care giver support system in place?: Yes (comment)   Criminal Activity/Legal Involvement Pertinent to Current Situation/Hospitalization: No - Comment as needed  Activities of Daily Living Home Assistive Devices/Equipment: Walker (specify type) ADL Screening (condition at time of admission) Patient's cognitive ability adequate to safely complete daily activities?: Yes Is the patient deaf or have difficulty hearing?: No Does the patient have difficulty seeing, even when wearing  glasses/contacts?: No Does the patient have difficulty concentrating, remembering, or making decisions?: No Patient able to express need for assistance with ADLs?: Yes Does the patient have difficulty dressing or bathing?: No Independently performs ADLs?: Yes (appropriate for developmental age) Does the patient have difficulty walking or climbing stairs?: Yes Weakness of Legs: Left Weakness of Arms/Hands: None  Permission Sought/Granted   Permission granted to share information with : Yes, Verbal Permission Granted  Share Information with NAME: Justin Hall     Permission granted to share info w Relationship: spouse  Permission granted to share info w Contact Information: [phone]  Emotional Assessment Appearance:: Appears stated age Attitude/Demeanor/Rapport: Gracious, Engaged Affect (typically observed): Accepting, Pleasant Orientation: : Oriented to Self, Oriented to Place, Oriented to  Time, Oriented to Situation Alcohol / Substance Use: Not Applicable Psych Involvement: No (comment)  Admission diagnosis:  Infection of prosthetic total hip joint ([place]) [T84.59XA, Z96.649] Patient Active Problem List   Diagnosis Date Noted  . Infection of prosthetic total hip joint ([place]) 02/23/2020  . OA (osteoarthritis) of hip 01/23/2020  . Body mass index 40.0-44.9, adult ([place]) 03/08/2019  . Uncontrolled type 2 diabetes mellitus with hyperglycemia ([place]) 01/28/2019  . Primary osteoarthritis of left hip 01/28/2019  . Acute respiratory failure with hypoxia ([place]) 10/18/2018  . Diastolic dysfunction   . Acute exacerbation of CHF (congestive heart failure) ([place]) 10/17/2018  . Coronary artery disease 10/17/2018  . Gout flare 05/04/2018  .  Sepsis ([place]) 05/04/2018  . Psychophysiological insomnia 01/25/2018  . Bunion of great toe 11/18/2017  . Morbid obesity ([place]) 08/12/2017  . Essential hypertension 08/12/2017  . Chronic gout without tophus 08/12/2017  . Chest pain 06/01/2017  . Hypertensive  urgency 06/01/2017  . DM2 (diabetes mellitus, type 2) ([place]) 06/01/2017  . S/P laparoscopic hernia repair 02/27/2014  . Restless leg syndrome    PCP:  [name], DO Pharmacy:   [facility] [phone] [address] AT [facility] [address] [place] [address] Phone: [phone] Fax: [phone]     Social Determinants of Health ([place]) Interventions    Readmission Risk Interventions Readmission Risk Prevention Plan 02/24/2020  Transportation Screening Complete  PCP or Specialist Appt within 5-7 Days Complete  Home Care Screening Complete  Medication Review (RN CM) Complete  Some recent data might be hidden

## 2020-02-24 NOTE — Progress Notes (Signed)
Pharmacy Antibiotic Note  Justin Hall is a 54 y.o. male admitted on 02/23/2020 with prosthetic joint infection.  Pharmacy has been consulted for Vancomycin dosing.  He has received Vancomycin 1g and Cefazolin 2g preop doses.  02/24/2020  D2 Abx for PJI L hip s/p I&D, hardware exchange 11/4 AF WBC 13 (decadron 10 mg 11/4) SCr up to 2.34, CrCl ~ 49 ml/min Gram stain from OR culture: no organism seen  Plan: Decrease Vancomycin to 1000 mg IV q24h.  Ancef 2 gm IV q8h per MD Measure Vanc trough at steady state PRN Follow up renal function, culture results, and clinical course.  F/u for OPAT  Height: 5' 9.5" (176.5 cm) Weight: (!) 136.3 kg (300 lb 8 oz) IBW/kg (Calculated) : 71.85  Temp (24hrs), Avg:97.7 F (36.5 C), Min:97.4 F (36.3 C), Max:98.5 F (36.9 C)  Recent Labs  Lab 02/23/20 1045 02/24/20 0317  WBC 9.5 13.0*  CREATININE 1.85* 2.34*    Estimated Creatinine Clearance: 48.7 mL/min (A) (by C-G formula based on SCr of 2.34 mg/dL (H)).    No Known Allergies Antimicrobials this admission: 11/4 Cefazolin >> 11/4 Vancomycin >> Dose adjustments this admission: 11/5 vanc 1500q24> 1 gm q24 Microbiology results: 11/4 L hip superficial fluid:   no org on gram stain 11/4 tissue L femoral interface membrane: no org on gram stain 11/4 MRSA positive MSSA positive 11/3 covid neg  Thank you for allowing pharmacy to be a part of this patient's care.  Eudelia Bunch, Pharm.D11/08/2019 9:18 AM Clinical Pharmacist [facility] main pharmacy [phone] 02/24/2020 9:15 AM

## 2020-02-24 NOTE — Progress Notes (Signed)
Physical Therapy Treatment Patient Details Name: Justin Hall MRN: [id number] DOB: [birth year] Today's Date: 02/24/2020    History of Present Illness Patient is 54 y.o. male admitted with L THR peri-prothetic infection and now s/p I&D.  Pt s/p Lt THA via posterolateral approach on 01/23/20 with PMH significant for OA, HTN, DM, CHF, depression, anxiety.    PT Comments    Pt continues very motivated and with noted increased activity tolerance this pm but with increased time required for all activities.   Follow Up Recommendations  Follow surgeon's recommendation for DC plan and follow-up therapies;Other (comment)     Equipment Recommendations  None recommended by PT    Recommendations for Other Services OT consult     Precautions / Restrictions Precautions Precautions: Fall;Posterior Hip Precaution Comments: pt recalled 2/3 THP so reducated pt and spouse Restrictions Weight Bearing Restrictions: No Other Position/Activity Restrictions: WBAT    Mobility  Bed Mobility Overal bed mobility: Needs Assistance Bed Mobility: Sit to Supine     Supine to sit: Min assist;Mod assist Sit to supine: Min assist;Mod assist   General bed mobility comments: Increased time and use of bed rails.  Cues for sequence and use of R LE to self assist  Transfers Overall transfer level: Needs assistance Equipment used: Rolling walker (2 wheeled) Transfers: Sit to/from Stand Sit to Stand: Min assist;Mod assist;From elevated surface         General transfer comment: cues for LE management and use of UEs to self assist  Ambulation/Gait Ambulation/Gait assistance: Min assist;Min guard Gait Distance (Feet): 57 Feet Assistive device: Rolling walker (2 wheeled) Gait Pattern/deviations: Step-to pattern;Decreased stride length;Decreased weight shift to left Gait velocity: decreased   General Gait Details: Increased time with cues for sequence, posture and position from [employer]              Wheelchair Mobility    Modified Rankin (Stroke Patients Only)       Balance Overall balance assessment: Needs assistance Sitting-balance support: Feet supported;No upper extremity supported Sitting balance-Leahy Scale: Good     Standing balance support: During functional activity;Bilateral upper extremity supported Standing balance-Leahy Scale: Poor                              Cognition Arousal/Alertness: Awake/alert Behavior During Therapy: WFL for tasks assessed/performed Overall Cognitive Status: Within Functional Limits for tasks assessed                                 General Comments: AxIO x 3 very motivated      Exercises      General Comments        Pertinent Vitals/Pain Pain Assessment: 0-10 Pain Score: 7  Pain Location: LEFT hip/buttock Pain Descriptors / Indicators: Grimacing;Tender;Operative site guarding;Sore Pain Intervention(s): Limited activity within patient's tolerance;Monitored during session;Patient requesting pain meds-RN notified    Home Living                      Prior Function            PT Goals (current goals can now be found in the care plan section) Acute Rehab PT Goals Patient Stated Goal: stop hurting and get home PT Goal Formulation: With patient/family Time For Goal Achievement: 01/30/20 Potential to Achieve Goals: Good Progress towards PT goals: Progressing toward goals    Frequency  7X/week      PT Plan Current plan remains appropriate    Co-evaluation              AM-PAC PT "6 Clicks" Mobility   Outcome Measure  Help needed turning from your back to your side while in a flat bed without using bedrails?: A Lot Help needed moving from lying on your back to sitting on the side of a flat bed without using bedrails?: A Lot Help needed moving to and from a bed to a chair (including a wheelchair)?: A Lot Help needed standing up from a chair using your arms (e.g.,  wheelchair or bedside chair)?: A Lot Help needed to walk in hospital room?: A Little Help needed climbing 3-5 steps with a railing? : A Lot 6 Click Score: 13    End of Session Equipment Utilized During Treatment: Gait belt Activity Tolerance: Patient tolerated treatment well;Patient limited by pain Patient left: in bed;with call bell/phone within reach;with bed alarm set Nurse Communication: Mobility status PT Visit Diagnosis: Muscle weakness (generalized) (M62.81);Difficulty in walking, not elsewhere classified (R26.2)     Time: 1610-9604 PT Time Calculation (min) (ACUTE ONLY): 33 min  Charges:  $Gait Training: 23-37 mins                     [place] Pager [phone] Office [phone]    Shaianne Nucci 02/24/2020, 5:13 PM

## 2020-02-24 NOTE — Progress Notes (Signed)
Initial Nutrition Assessment  DOCUMENTATION CODES:   Morbid obesity  INTERVENTION:   -Ensure MAX Protein po daily, each supplement provides 150 kcal and 30 grams of protein  -1 packet Juven BID, each packet provides 95 calories, 2.5 grams of protein (collagen), and 9.8 grams of carbohydrate (3 grams sugar); also contains 7 grams of L-arginine and L-glutamine, 300 mg vitamin C, 15 mg vitamin E, 1.2 mcg vitamin B-12, 9.5 mg zinc, 200 mg calcium, and 1.5 g  Calcium Beta-hydroxy-Beta-methylbutyrate to support wound healing   NUTRITION DIAGNOSIS:   Increased nutrient needs related to wound healing as evidenced by estimated needs.  GOAL:   Patient will meet greater than or equal to 90% of their needs  MONITOR:   Supplement acceptance, PO intake, Labs, Weight trends, I & O's, Skin  REASON FOR ASSESSMENT:   Consult Wound healing  ASSESSMENT:   54 y.o. male who presents with a diagnosis of superficial infection left total hip.  Had a primary left total hip arthroplasty posterior approach by Dr. Gaynelle Arabian on 01/23/2020.  11/4: s/p I&D of left total hip arthroplasty with head ball and liner exchange  Patient currently consuming 100% of meals. Given increased needs, will order Ensure Max and Juven supplements to aid in wound healing.  Per weight records, pt lost 10 lbs since 10/4 (3% wt loss x 1 month, insignificant for time frame).    Medications: Colace, Lasix, Senokot  Labs reviewed:  CBGs: 150-192 Low Na Elevated K  NUTRITION - FOCUSED PHYSICAL EXAM:  Deferred.  Diet Order:   Diet Order            Diet Carb Modified Fluid consistency: Thin; Room service appropriate? Yes  Diet effective now                 EDUCATION NEEDS:   No education needs have been identified at this time  Skin:  Skin Assessment: Skin Integrity Issues: Skin Integrity Issues:: Incisions Incisions: left hip  Last BM:  11/3  Height:   Ht Readings from Last 1 Encounters:  02/23/20  5' 9.5" (1.765 m)    Weight:   Wt Readings from Last 1 Encounters:  02/23/20 (!) 136.3 kg   BMI:  Body mass index is 43.74 kg/m.  Estimated Nutritional Needs:   Kcal:  1950-9326  Protein:  95-110g  Fluid:  2.2L/day  Clayton Bibles, MS, RD, LDN Inpatient Clinical Dietitian Contact information available via Amion

## 2020-02-24 NOTE — Progress Notes (Addendum)
Subjective:  Patient reports pain as mild to moderate.  Denies N/V/CP/SOB. No c/o.  Objective:   VITALS:   Vitals:   02/24/20 0225 02/24/20 0706 02/24/20 0943 02/24/20 1139  BP: 136/87 (!) 152/79 136/83 129/83  Pulse: 95 84 75 91  Resp: 18 18 16 20   Temp: (!) 97.5 F (36.4 C) 97.7 F (36.5 C) 97.7 F (36.5 C) 98 F (36.7 C)  TempSrc: Oral  Oral Oral  SpO2: 93% 91% 93% 94%  Weight:      Height:       HV & JP: were recorded together   NAD ABD soft Sensation intact distally Intact pulses distally Dorsiflexion/Plantar flexion intact Incision: dressing C/D/I Compartment soft VAC intact without leak HV ss JP ss  Lab Results  Component Value Date   WBC 13.0 (H) 02/24/2020   HGB 7.1 (L) 02/24/2020   HCT 22.8 (L) 02/24/2020   MCV 80.9 02/24/2020   PLT 477 (H) 02/24/2020   BMET    Component Value Date/Time   NA 134 (L) 02/24/2020 0317   NA 142 07/14/2019 1247   K 5.3 (H) 02/24/2020 0317   CL 99 02/24/2020 0317   CO2 24 02/24/2020 0317   GLUCOSE 163 (H) 02/24/2020 0317   BUN 34 (H) 02/24/2020 0317   BUN 18 07/14/2019 1247   CREATININE 2.34 (H) 02/24/2020 0317   CALCIUM 8.2 (L) 02/24/2020 0317   GFRNONAA 32 (L) 02/24/2020 0317   GFRAA >60 01/24/2020 0404   Recent Results (from the past 240 hour(s))  SARS CORONAVIRUS 2 (TAT 6-24 HRS) Nasopharyngeal Nasopharyngeal Swab     Status: None   Collection Time: 02/22/20  2:34 PM   Specimen: Nasopharyngeal Swab  Result Value Ref Range Status   SARS Coronavirus 2 NEGATIVE NEGATIVE Final    Comment: (NOTE) SARS-CoV-2 target nucleic acids are NOT DETECTED.  The SARS-CoV-2 RNA is generally detectable in upper and lower respiratory specimens during the acute phase of infection. Negative results do not preclude SARS-CoV-2 infection, do not rule out co-infections with other pathogens, and should not be used as the sole basis for treatment or other patient management decisions. Negative results must be combined with  clinical observations, patient history, and epidemiological information. The expected result is Negative.  Fact Sheet for Patients: SugarRoll.be  Fact Sheet for Healthcare Providers: https://www.woods-mathews.com/  This test is not yet approved or cleared by the [place] FDA and  has been authorized for detection and/or diagnosis of SARS-CoV-2 by FDA under an Emergency Use Authorization (EUA). This EUA will remain  in effect (meaning this test can be used) for the duration of the COVID-19 declaration under Se ction 564(b)(1) of the Act, 21 U.S.C. section 360bbb-3(b)(1), unless the authorization is terminated or revoked sooner.  Performed at [facility], [place] [address], [place], [place] [address]   Surgical pcr screen     Status: Abnormal   Collection Time: 02/23/20 10:35 AM   Specimen: Nasal Mucosa; Nasal Swab  Result Value Ref Range Status   MRSA, PCR POSITIVE (A) NEGATIVE Final    Comment: RESULT CALLED TO, READ BACK BY AND VERIFIED WITH: GIGLIOTTI,K. @1447  ON 11.04.2021 BY COHEN,K    Staphylococcus aureus POSITIVE (A) NEGATIVE Final    Comment: (NOTE) The Xpert SA Assay (FDA approved for NASAL specimens in patients 27 years of age and older), is one component of a comprehensive surveillance program. It is not intended to diagnose infection nor to guide or monitor treatment. Performed at [facility]  [place] [address], [place], [place] [address]   Aerobic/Anaerobic Culture (surgical/deep wound)     Status: None (Preliminary result)   Collection Time: 02/23/20  2:21 PM   Specimen: PATH Soft tissue  Result Value Ref Range Status   Specimen Description   Final    TISSUE LEFT FEMORAL INTERFACE MEMBRANE Performed at [place] [address], [place], [place] [address]    Special Requests   Final    NONE Performed at [facility], [place] [address], [place],  [place] [address]    Gram Stain   Final    RARE WBC PRESENT, PREDOMINANTLY MONONUCLEAR NO ORGANISMS SEEN    Culture   Final    NO GROWTH < 12 HOURS Performed at [facility] [address] [place], [place] [address]    Report Status PENDING  Incomplete  Aerobic/Anaerobic Culture (surgical/deep wound)     Status: None (Preliminary result)   Collection Time: 02/23/20  2:29 PM   Specimen: PATH Other; Tissue  Result Value Ref Range Status   Specimen Description   Final    WOUND LEFT HIP SUPERFICIAL FLUID Performed at [facility], [place] [address] [place], [place] [address]    Special Requests   Final    NONE Performed at [facility], [place] [address], [place], [place] [address]    Gram Stain   Final    RARE WBC PRESENT, PREDOMINANTLY MONONUCLEAR NO ORGANISMS SEEN    Culture   Final    TOO YOUNG TO READ Performed at [facility], [place] [address], [place], [place] [address]    Report Status PENDING  Incomplete       Assessment/Plan: 1 Day Post-Op   Active Problems:   Infection of prosthetic total hip joint ([place])   WBAT with walker Posterior hip precautions Abduction brace at all times except hygiene DVT ppx: Aspirin, SCDs, TEDS PO pain control PT/OT L hip PJI: cont IV ancef and vanco for now, Intraop cultures NGTD, blood cultures ordered ABLA: transfuse 1 unit PRBCs, monitor CKD IIIB: mild elevation in cr today, continue IVFs and recheck in am Dispo: d/c HV drain when output less than 30 cc/shift, retain JP drain, will need ID consult and PICC once cultures are back   H[place] Cork Deja Kaigler 02/24/2020, 12:12 PM   Rod Can, MD [phone] [place] [facility] now [facility]   [place] [address], [address], [place], [place] [address] Phone: [phone] [email or web address]   [employer]

## 2020-02-25 ENCOUNTER — Inpatient Hospital Stay (HOSPITAL_COMMUNITY): Payer: No Typology Code available for payment source

## 2020-02-25 DIAGNOSIS — E875 Hyperkalemia: Secondary | ICD-10-CM | POA: Diagnosis not present

## 2020-02-25 DIAGNOSIS — T8459XA Infection and inflammatory reaction due to other internal joint prosthesis, initial encounter: Secondary | ICD-10-CM | POA: Diagnosis not present

## 2020-02-25 DIAGNOSIS — N1831 Chronic kidney disease, stage 3a: Secondary | ICD-10-CM | POA: Diagnosis not present

## 2020-02-25 DIAGNOSIS — I10 Essential (primary) hypertension: Secondary | ICD-10-CM

## 2020-02-25 DIAGNOSIS — I5032 Chronic diastolic (congestive) heart failure: Secondary | ICD-10-CM

## 2020-02-25 DIAGNOSIS — N179 Acute kidney failure, unspecified: Secondary | ICD-10-CM

## 2020-02-25 DIAGNOSIS — E119 Type 2 diabetes mellitus without complications: Secondary | ICD-10-CM

## 2020-02-25 LAB — RENAL FUNCTION PANEL
Albumin: 2.8 g/dL — ABNORMAL LOW (ref 3.5–5.0)
Anion gap: 14 (ref 5–15)
BUN: 41 mg/dL — ABNORMAL HIGH (ref 6–20)
CO2: 23 mmol/L (ref 22–32)
Calcium: 8 mg/dL — ABNORMAL LOW (ref 8.9–10.3)
Chloride: 98 mmol/L (ref 98–111)
Creatinine, Ser: 2.34 mg/dL — ABNORMAL HIGH (ref 0.61–1.24)
GFR, Estimated: 32 mL/min — ABNORMAL LOW (ref >60)
Glucose, Bld: 131 mg/dL — ABNORMAL HIGH (ref 70–99)
Phosphorus: 4 mg/dL (ref 2.5–4.6)
Potassium: 3.9 mmol/L (ref 3.5–5.1)
Sodium: 135 mmol/L (ref 135–145)

## 2020-02-25 LAB — BASIC METABOLIC PANEL
Anion gap: 10 (ref 5–15)
BUN: 41 mg/dL — ABNORMAL HIGH (ref 6–20)
CO2: 28 mmol/L (ref 22–32)
Calcium: 8.1 mg/dL — ABNORMAL LOW (ref 8.9–10.3)
Chloride: 98 mmol/L (ref 98–111)
Creatinine, Ser: 2.42 mg/dL — ABNORMAL HIGH (ref 0.61–1.24)
GFR, Estimated: 31 mL/min — ABNORMAL LOW (ref >60)
Glucose, Bld: 129 mg/dL — ABNORMAL HIGH (ref 70–99)
Potassium: 3.9 mmol/L (ref 3.5–5.1)
Sodium: 136 mmol/L (ref 135–145)

## 2020-02-25 LAB — GLUCOSE, CAPILLARY
Glucose-Capillary: 127 mg/dL — ABNORMAL HIGH (ref 70–99)
Glucose-Capillary: 127 mg/dL — ABNORMAL HIGH (ref 70–99)
Glucose-Capillary: 134 mg/dL — ABNORMAL HIGH (ref 70–99)
Glucose-Capillary: 104 mg/dL — ABNORMAL HIGH (ref 70–99)

## 2020-02-25 LAB — CBC
HCT: 20.4 % — ABNORMAL LOW (ref 39.0–52.0)
Hemoglobin: 6.5 g/dL — CL (ref 13.0–17.0)
MCH: 26.3 pg (ref 26.0–34.0)
MCHC: 31.9 g/dL (ref 30.0–36.0)
MCV: 82.6 fL (ref 80.0–100.0)
Platelets: 385 10*3/uL (ref 150–400)
RBC: 2.47 MIL/uL — ABNORMAL LOW (ref 4.22–5.81)
RDW: 14.7 % (ref 11.5–15.5)
WBC: 11.2 10*3/uL — ABNORMAL HIGH (ref 4.0–10.5)
nRBC: 0 % (ref 0.0–0.2)

## 2020-02-25 LAB — PREPARE RBC (CROSSMATCH)

## 2020-02-25 MED ORDER — SODIUM CHLORIDE 0.9% IV SOLUTION: Freq: Once | INTRAVENOUS | Status: DC

## 2020-02-25 MED ORDER — HYDRALAZINE HCL 20 MG/ML IJ SOLN
10.0000 mg | Freq: Three times a day (TID) | INTRAMUSCULAR | Status: DC | PRN
  Administered 2020-02-27 – 2020-02-28 (×2): 10 mg via INTRAVENOUS
  Filled 2020-02-25 (×2): qty 1

## 2020-02-25 NOTE — Progress Notes (Signed)
Physical Therapy Treatment Patient Details Name: Justin Hall MRN: [id number] DOB: 1965-06-20 Today's Date: 02/25/2020    History of Present Illness Patient is 54 y.o. male admitted with L THR peri-prothetic infection and now s/p I&D.  Pt s/p Lt THA via posterolateral approach on 01/23/20 with PMH significant for OA, HTN, DM, CHF, depression, anxiety.    PT Comments    Pt continues very motivated and progressing steadily with mobility including increased activity tolerance and decreased level of assist for all tasks.   Follow Up Recommendations  Follow surgeon's recommendation for DC plan and follow-up therapies;Other (comment)     Equipment Recommendations  None recommended by PT    Recommendations for Other Services OT consult     Precautions / Restrictions Precautions Precautions: Fall;Posterior Hip Restrictions Weight Bearing Restrictions: No Other Position/Activity Restrictions: WBAT    Mobility  Bed Mobility Overal bed mobility: Needs Assistance Bed Mobility: Supine to Sit     Supine to sit: Min assist     General bed mobility comments: Increased time and use of bed rails.  Cues for sequence and use of R LE to self assist  Transfers Overall transfer level: Needs assistance Equipment used: Rolling walker (2 wheeled) Transfers: Sit to/from Stand Sit to Stand: Min assist         General transfer comment: cues for LE management and use of UEs to self assist  Ambulation/Gait Ambulation/Gait assistance: Min guard Gait Distance (Feet): 85 Feet Assistive device: Rolling walker (2 wheeled) Gait Pattern/deviations: Step-to pattern;Decreased stride length;Decreased weight shift to left Gait velocity: decreased   General Gait Details: Increased time with min cues for sequence, posture and position from [employer]             Wheelchair Mobility    Modified Rankin (Stroke Patients Only)       Balance Overall balance assessment: Needs  assistance Sitting-balance support: Feet supported;No upper extremity supported Sitting balance-Leahy Scale: Good     Standing balance support: During functional activity;Bilateral upper extremity supported Standing balance-Leahy Scale: Poor                              Cognition Arousal/Alertness: Awake/alert Behavior During Therapy: WFL for tasks assessed/performed Overall Cognitive Status: Within Functional Limits for tasks assessed                                 General Comments: AxIO x 3 very motivated      Exercises Total Joint Exercises Ankle Circles/Pumps: AROM;Both;15 reps;Seated    General Comments        Pertinent Vitals/Pain Pain Assessment: 0-10 Pain Score: 7  Pain Location: LEFT hip/buttock Pain Descriptors / Indicators: Aching;Sore Pain Intervention(s): Limited activity within patient's tolerance;Monitored during session;Premedicated before session    Home Living                      Prior Function            PT Goals (current goals can now be found in the care plan section) Acute Rehab PT Goals Patient Stated Goal: stop hurting and get home PT Goal Formulation: With patient/family Time For Goal Achievement: 01/30/20 Potential to Achieve Goals: Good Progress towards PT goals: Progressing toward goals    Frequency    7X/week      PT Plan Current plan remains appropriate  Co-evaluation              AM-PAC PT "6 Clicks" Mobility   Outcome Measure  Help needed turning from your back to your side while in a flat bed without using bedrails?: A Lot Help needed moving from lying on your back to sitting on the side of a flat bed without using bedrails?: A Little Help needed moving to and from a bed to a chair (including a wheelchair)?: A Little Help needed standing up from a chair using your arms (e.g., wheelchair or bedside chair)?: A Little Help needed to walk in hospital room?: A Little Help needed  climbing 3-5 steps with a railing? : A Lot 6 Click Score: 16    End of Session Equipment Utilized During Treatment: Gait belt Activity Tolerance: Patient tolerated treatment well;Patient limited by pain Patient left: with call bell/phone within reach;in chair;with family/visitor present Nurse Communication: Mobility status PT Visit Diagnosis: Muscle weakness (generalized) (M62.81);Difficulty in walking, not elsewhere classified (R26.2)     Time: 1753-0104 PT Time Calculation (min) (ACUTE ONLY): 24 min  Charges:  $Gait Training: 23-37 mins                     [place] Pager [phone] Office [phone]    Justin Hall 02/25/2020, 12:50 PM

## 2020-02-25 NOTE — Progress Notes (Signed)
Physical Therapy Treatment Patient Details Name: Justin Hall MRN: [id number] DOB: 1965/11/19 Today's Date: 02/25/2020    History of Present Illness Patient is 54 y.o. male admitted with L THR peri-prothetic infection and now s/p I&D.  Pt s/p Lt THA via posterolateral approach on 01/23/20 with PMH significant for OA, HTN, DM, CHF, depression, anxiety.    PT Comments    Pt continues very motivated and progressing steadily with mobility including increased activity tolerance and decreased level of assist for all tasks  Follow Up Recommendations  Follow surgeon's recommendation for DC plan and follow-up therapies;Other (comment)     Equipment Recommendations  None recommended by PT    Recommendations for Other Services OT consult     Precautions / Restrictions Precautions Precautions: Fall;Posterior Hip Precaution Comments: pt recalled 2/3 THP so reducated pt and spouse Restrictions Weight Bearing Restrictions: No Other Position/Activity Restrictions: WBAT    Mobility  Bed Mobility Overal bed mobility: Needs Assistance Bed Mobility: Supine to Sit;Sit to Supine     Supine to sit: Min assist Sit to supine: Min assist;Mod assist   General bed mobility comments: Increased time and use of bed rails.  Cues for sequence and use of R LE to self assist  Transfers Overall transfer level: Needs assistance Equipment used: Rolling walker (2 wheeled) Transfers: Sit to/from Stand Sit to Stand: Min guard         General transfer comment: cues for LE management and use of UEs to self assist  Ambulation/Gait Ambulation/Gait assistance: Min guard Gait Distance (Feet): 140 Feet Assistive device: Rolling walker (2 wheeled) Gait Pattern/deviations: Step-to pattern;Decreased stride length;Decreased weight shift to left Gait velocity: decreased   General Gait Details: Increased time with min cues for sequence, posture and position from [employer]             Wheelchair  Mobility    Modified Rankin (Stroke Patients Only)       Balance Overall balance assessment: Needs assistance Sitting-balance support: Feet supported;No upper extremity supported Sitting balance-Leahy Scale: Good     Standing balance support: During functional activity;Bilateral upper extremity supported Standing balance-Leahy Scale: Fair                              Cognition Arousal/Alertness: Awake/alert Behavior During Therapy: WFL for tasks assessed/performed Overall Cognitive Status: Within Functional Limits for tasks assessed                                 General Comments: AxIO x 3 very motivated      Exercises      General Comments        Pertinent Vitals/Pain Pain Assessment: 0-10 Pain Score: 7  Pain Location: LEFT hip/buttock Pain Descriptors / Indicators: Aching;Sore Pain Intervention(s): Limited activity within patient's tolerance;Monitored during session;Premedicated before session    Home Living                      Prior Function            PT Goals (current goals can now be found in the care plan section) Acute Rehab PT Goals Patient Stated Goal: stop hurting and get home PT Goal Formulation: With patient/family Time For Goal Achievement: 01/30/20 Potential to Achieve Goals: Good Progress towards PT goals: Progressing toward goals    Frequency    7X/week  PT Plan Current plan remains appropriate    Co-evaluation              AM-PAC PT "6 Clicks" Mobility   Outcome Measure  Help needed turning from your back to your side while in a flat bed without using bedrails?: A Lot Help needed moving from lying on your back to sitting on the side of a flat bed without using bedrails?: A Little Help needed moving to and from a bed to a chair (including a wheelchair)?: A Little Help needed standing up from a chair using your arms (e.g., wheelchair or bedside chair)?: A Little Help needed to walk  in hospital room?: A Little Help needed climbing 3-5 steps with a railing? : A Lot 6 Click Score: 16    End of Session Equipment Utilized During Treatment: Gait belt Activity Tolerance: Patient tolerated treatment well;Patient limited by pain Patient left: with call bell/phone within reach;with family/visitor present;in bed Nurse Communication: Mobility status PT Visit Diagnosis: Muscle weakness (generalized) (M62.81);Difficulty in walking, not elsewhere classified (R26.2)     Time: 3845-3646 PT Time Calculation (min) (ACUTE ONLY): 37 min  Charges:  $Gait Training: 23-37 mins                     [place] Pager [phone] Office [phone]    Farhaan Mabee 02/25/2020, 4:39 PM

## 2020-02-25 NOTE — Progress Notes (Signed)
CRITICAL VALUE ALERT  Critical Value: Hgb 6.5  Date & Time Notied:  11/6 05:18am  Provider Notified: Jonelle Sidle PA-C  Orders Received/Actions taken: See new orders
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
## 2020-02-25 NOTE — Progress Notes (Addendum)
Subjective: 2 Days Post-Op Procedure(s) (LRB): IRRIGATION AND DEBRIDEMENT left hip, HEAD BALL AND LINER CHANGE (Left) Patient reports pain as 4 on 0-10 scale.   Denies CP or SOB.  Voiding without difficulty. Positive flatus. No dizziness Objective: Vital signs in last 24 hours: Temp:  [97.5 F (36.4 C)-98 F (36.7 C)] 97.8 F (36.6 C) (11/06 0521) Pulse Rate:  [75-91] 87 (11/06 0521) Resp:  [14-20] 14 (11/06 0521) BP: (128-164)/(80-101) 135/92 (11/06 0521) SpO2:  [93 %-98 %] 97 % (11/06 0521)  Intake/Output from previous day: 11/05 0701 - 11/06 0700 In: 2308.7 [P.O.:960; I.V.:410; Blood:386; IV Piggyback:552.7] Out: 1783 [Urine:1650; Drains:133] Intake/Output this shift: No intake/output data recorded.  Recent Labs    02/23/20 1045 02/23/20 1425 02/23/20 1708 02/24/20 0317 02/25/20 0404  HGB 9.5* 9.5* 8.2* 7.1* 6.5*   Recent Labs    02/24/20 0317 02/25/20 0404  WBC 13.0* 11.2*  RBC 2.82* 2.47*  HCT 22.8* 20.4*  PLT 477* 385   Recent Labs    02/23/20 1045 02/23/20 1045 02/23/20 1425 02/24/20 0317  NA 134*   < > 137 134*  K 4.4   < > 4.7 5.3*  CL 97*   < > 99 99  CO2 27  --   --  24  BUN 29*   < > 27* 34*  CREATININE 1.85*   < > 1.80* 2.34*  GLUCOSE 106*   < > 133* 163*  CALCIUM 8.5*  --   --  8.2*   < > = values in this interval not displayed.   Recent Labs    02/23/20 1045  INR 1.1    Neurologically intact Intact pulses distally Dorsiflexion/Plantar flexion intact Incision: dressing C/D/I thigh soft.  Assessment/Plan: 2 Days Post-Op Procedure(s) (LRB): IRRIGATION AND DEBRIDEMENT left hip, HEAD BALL AND LINER CHANGE (Left) Advance diet  HV d/c'd with 20cc. Tip intact. K+ 5.3, Cr 2.34 up, HCT 20.4 down, UO good. Repeat BMET On Vanco with dose decreased due to renal function. Cultures pending. Gm stain neg. Hgb 6.5 T & C 2 units Consult Hospitalist  Johnn Hai 02/25/2020, 7:49 AM

## 2020-02-26 DIAGNOSIS — N179 Acute kidney failure, unspecified: Secondary | ICD-10-CM | POA: Diagnosis not present

## 2020-02-26 DIAGNOSIS — Z09 Encounter for follow-up examination after completed treatment for conditions other than malignant neoplasm: Secondary | ICD-10-CM | POA: Diagnosis not present

## 2020-02-26 DIAGNOSIS — T8459XA Infection and inflammatory reaction due to other internal joint prosthesis, initial encounter: Secondary | ICD-10-CM | POA: Diagnosis not present

## 2020-02-26 DIAGNOSIS — Z96649 Presence of unspecified artificial hip joint: Secondary | ICD-10-CM | POA: Diagnosis not present

## 2020-02-26 LAB — TYPE AND SCREEN
ABO/RH(D): B NEG
Antibody Screen: NEGATIVE
Unit division: 0
Unit division: 0
Unit division: 0

## 2020-02-26 LAB — CBC
HCT: 27 % — ABNORMAL LOW (ref 39.0–52.0)
Hemoglobin: 8.5 g/dL — ABNORMAL LOW (ref 13.0–17.0)
MCH: 26.4 pg (ref 26.0–34.0)
MCHC: 31.5 g/dL (ref 30.0–36.0)
MCV: 83.9 fL (ref 80.0–100.0)
Platelets: 414 10*3/uL — ABNORMAL HIGH (ref 150–400)
RBC: 3.22 MIL/uL — ABNORMAL LOW (ref 4.22–5.81)
RDW: 15.7 % — ABNORMAL HIGH (ref 11.5–15.5)
WBC: 12.3 10*3/uL — ABNORMAL HIGH (ref 4.0–10.5)
nRBC: 0 % (ref 0.0–0.2)

## 2020-02-26 LAB — BPAM RBC
Blood Product Expiration Date: 202112052359
Blood Product Expiration Date: 202112102359
Blood Product Expiration Date: 202112112359
ISSUE DATE / TIME: 202111051158
ISSUE DATE / TIME: 202111060856
ISSUE DATE / TIME: 202111061305
Unit Type and Rh: 1700
Unit Type and Rh: 1700
Unit Type and Rh: 1700

## 2020-02-26 LAB — RENAL FUNCTION PANEL
Albumin: 2.8 g/dL — ABNORMAL LOW (ref 3.5–5.0)
Anion gap: 12 (ref 5–15)
BUN: 41 mg/dL — ABNORMAL HIGH (ref 6–20)
CO2: 25 mmol/L (ref 22–32)
Calcium: 8 mg/dL — ABNORMAL LOW (ref 8.9–10.3)
Chloride: 100 mmol/L (ref 98–111)
Creatinine, Ser: 2.14 mg/dL — ABNORMAL HIGH (ref 0.61–1.24)
GFR, Estimated: 35 mL/min — ABNORMAL LOW (ref >60)
Glucose, Bld: 93 mg/dL (ref 70–99)
Phosphorus: 3.8 mg/dL (ref 2.5–4.6)
Potassium: 4.3 mmol/L (ref 3.5–5.1)
Sodium: 137 mmol/L (ref 135–145)

## 2020-02-26 LAB — GLUCOSE, CAPILLARY
Glucose-Capillary: 102 mg/dL — ABNORMAL HIGH (ref 70–99)
Glucose-Capillary: 116 mg/dL — ABNORMAL HIGH (ref 70–99)
Glucose-Capillary: 102 mg/dL — ABNORMAL HIGH (ref 70–99)
Glucose-Capillary: 121 mg/dL — ABNORMAL HIGH (ref 70–99)

## 2020-02-26 MED ORDER — VANCOMYCIN VARIABLE DOSE PER UNSTABLE RENAL FUNCTION (PHARMACIST DOSING): Status: DC

## 2020-02-26 MED ORDER — CARVEDILOL 12.5 MG PO TABS
12.5000 mg | ORAL_TABLET | Freq: Two times a day (BID) | ORAL | Status: DC
  Administered 2020-02-26 – 2020-02-28 (×5): 12.5 mg via ORAL
  Filled 2020-02-26 (×5): qty 1

## 2020-02-26 NOTE — Progress Notes (Addendum)
PROGRESS NOTE    Justin Hall  DZH:[id number] DOB: 06/25/1965 DOA: 02/23/2020 PCP: [name], DO   Brief Narrative:Justin Hall is a 54 y.o. male with medical history significant of HFpEF, DM2, HTN. Presenting for I&D of left hip. He had a left total hip performed on 01/23/20. During his 1st post-operative follow up a couple weeks later, he was noted to have a hematoma. At his subsequent post-op follow up, he was noted to have purulent drainage from an incision site. Initially, an IR-guided drainage attempt was made. However, it was unsuccessful. He was then taken to the OR for surgical exploration. He successfully completed that surgery and had about 1000cc of blood/puss removed. He was started on ancef and vacn w/ pharm dosing. Over the last couple of days, his Scr has risen. TRH was consulted for this issue.  Assessment & Plan:   Active Problems:   Infection of prosthetic total hip joint ([place])   #1 infected left prosthetic hip status post left hip washout.  Per Ortho.  On vancomycin.  #2 AKI on CKD stage IIIa-holding Lasix Metformin and lisinopril Monitor renal functions closely on vancomycin.  Pharmacy is dosing vancomycin. Consider Zyvox if culture shows MRSA instead of vancomycin due to AKI.  MRSA PCR positive.  Follow-up culture and sensitivity. Renal function somewhat better than yesterday creatinine down to 2.14 Renal ultrasound shows no evidence of hydronephrosis.  Left kidney was not visualized well the right kidney shows medical renal disease.  #3 type 2 diabetes he is on Metformin at home which I am going to hold due to elevated creatinine. CBG (last 3)  Recent Labs    02/25/20 1703 02/25/20 2131 02/26/20 0720  GLUCAP 134* 104* 102*     #4 history of essential hypertension blood pressure 155/93.  Increase Coreg to 12.5 twice a day.  #5 chronic diastolic heart failure patient euvolemic-Lasix currently on hold continue to hold.  #6 acute blood loss anemia  status post 2 units of packed RBCs.  Hemoglobin 8.5 from 6.5.  #7 hyperkalemia resolved.  Nutrition Problem: Increased nutrient needs Etiology: wound healing     Signs/Symptoms: estimated needs    Interventions: Ju[place], Refer to RD note for recommendations  Estimated body mass index is 43.74 kg/m as calculated from the following:   Height as of this encounter: 5' 9.5" (1.765 m).   Weight as of this encounter: 136.3 kg.  Subjective: Patient resting in bed he denies any shortness of breath chest pain nausea vomiting.  He is anxious to see the surgeon.  He feels stronger than yesterday.  He reports that he is having good urine output.  Objective: Vitals:   02/25/20 1335 02/25/20 1514 02/25/20 2013 02/26/20 0635  BP: 123/81 133/82 (!) 146/89 (!) 155/93  Pulse: 75 86 81 89  Resp:  16 18 18   Temp: 97.8 F (36.6 C) 97.8 F (36.6 C) 98 F (36.7 C) 97.9 F (36.6 C)  TempSrc: Oral Oral Oral Oral  SpO2: 94% 95% 94% 96%  Weight:      Height:        Intake/Output Summary (Last 24 hours) at 02/26/2020 1006 Last data filed at 02/26/2020 0900 Gross per 24 hour  Intake 2669.81 ml  Output 3435 ml  Net -765.19 ml   Filed Weights   02/23/20 1025  Weight: (!) 136.3 kg    Examination:  General exam: Appears calm and comfortable  Respiratory system: Clear to auscultation. Respiratory effort normal. Cardiovascular system: S1 & S2 heard, RRR. No  JVD, murmurs, rubs, gallops or clicks. No pedal edema. Gastrointestinal system: Abdomen is nondistended, soft and nontender. No organomegaly or masses felt. Normal bowel sounds heard. Central nervous system: Alert and oriented. No focal neurological deficits. Extremities: Left hip drains in place. Skin: No rashes, lesions or ulcers Psychiatry: Judgement and insight appear normal. Mood & affect appropriate.     Data Reviewed: I have personally reviewed following labs and imaging studies  CBC: Recent Labs  Lab 02/23/20 1045  02/23/20 1045 02/23/20 1425 02/23/20 1708 02/24/20 0317 02/25/20 0404 02/26/20 0318  WBC 9.5  --   --   --  13.0* 11.2* 12.3*  HGB 9.5*   < > 9.5* 8.2* 7.1* 6.5* 8.5*  HCT 29.9*   < > 28.0* 26.3* 22.8* 20.4* 27.0*  MCV 81.7  --   --   --  80.9 82.6 83.9  PLT 483*  --   --   --  477* 385 414*   < > = values in this interval not displayed.   Basic Metabolic Panel: Recent Labs  Lab 02/23/20 1045 02/23/20 1425 02/24/20 0317 02/25/20 1637 02/26/20 0318  NA 134* 137 134* 135  136 137  K 4.4 4.7 5.3* 3.9  3.9 4.3  CL 97* 99 99 98  98 100  CO2 27  --  24 23  28 25   GLUCOSE 106* 133* 163* 131*  129* 93  BUN 29* 27* 34* 41*  41* 41*  CREATININE 1.85* 1.80* 2.34* 2.34*  2.42* 2.14*  CALCIUM 8.5*  --  8.2* 8.0*  8.1* 8.0*  PHOS  --   --   --  4.0 3.8   GFR: Estimated Creatinine Clearance: 53.3 mL/min (A) (by C-G formula based on SCr of 2.14 mg/dL (H)). Liver Function Tests: Recent Labs  Lab 02/23/20 1045 02/25/20 1637 02/26/20 0318  AST 23  --   --   ALT 17  --   --   ALKPHOS 88  --   --   BILITOT 0.6  --   --   PROT 7.9  --   --   ALBUMIN 3.0* 2.8* 2.8*   No results for input(s): LIPASE, AMYLASE in the last 168 hours. No results for input(s): AMMONIA in the last 168 hours. Coagulation Profile: Recent Labs  Lab 02/23/20 1045  INR 1.1   Cardiac Enzymes: No results for input(s): CKTOTAL, CKMB, CKMBINDEX, TROPONINI in the last 168 hours. BNP (last 3 results) No results for input(s): PROBNP in the last 8760 hours. HbA1C: No results for input(s): HGBA1C in the last 72 hours. CBG: Recent Labs  Lab 02/25/20 0736 02/25/20 1159 02/25/20 1703 02/25/20 2131 02/26/20 0720  GLUCAP 127* 127* 134* 104* 102*   Lipid Profile: No results for input(s): CHOL, HDL, LDLCALC, TRIG, CHOLHDL, LDLDIRECT in the last 72 hours. Thyroid Function Tests: No results for input(s): TSH, T4TOTAL, FREET4, T3FREE, THYROIDAB in the last 72 hours. Anemia Panel: No results for  input(s): VITAMINB12, FOLATE, FERRITIN, TIBC, IRON, RETICCTPCT in the last 72 hours. Sepsis Labs: No results for input(s): PROCALCITON, LATICACIDVEN in the last 168 hours.  Recent Results (from the past 240 hour(s))  SARS CORONAVIRUS 2 (TAT 6-24 HRS) Nasopharyngeal Nasopharyngeal Swab     Status: None   Collection Time: 02/22/20  2:34 PM   Specimen: Nasopharyngeal Swab  Result Value Ref Range Status   SARS Coronavirus 2 NEGATIVE NEGATIVE Final    Comment: (NOTE) SARS-CoV-2 target nucleic acids are NOT DETECTED.  The SARS-CoV-2 RNA is generally detectable in  upper and lower respiratory specimens during the acute phase of infection. Negative results do not preclude SARS-CoV-2 infection, do not rule out co-infections with other pathogens, and should not be used as the sole basis for treatment or other patient management decisions. Negative results must be combined with clinical observations, patient history, and epidemiological information. The expected result is Negative.  Fact Sheet for Patients: SugarRoll.be  Fact Sheet for Healthcare Providers: https://www.woods-Maie Kesinger.com/  This test is not yet approved or cleared by the [place] FDA and  has been authorized for detection and/or diagnosis of SARS-CoV-2 by FDA under an Emergency Use Authorization (EUA). This EUA will remain  in effect (meaning this test can be used) for the duration of the COVID-19 declaration under Se ction 564(b)(1) of the Act, 21 U.S.C. section 360bbb-3(b)(1), unless the authorization is terminated or revoked sooner.  Performed at [facility], [place] [address] [place], [place] [address]   Surgical pcr screen     Status: Abnormal   Collection Time: 02/23/20 10:35 AM   Specimen: Nasal Mucosa; Nasal Swab  Result Value Ref Range Status   MRSA, PCR POSITIVE (A) NEGATIVE Final    Comment: RESULT CALLED TO, READ BACK BY AND VERIFIED WITH: GIGLIOTTI,K. @1447   ON 11.04.2021 BY COHEN,K    Staphylococcus aureus POSITIVE (A) NEGATIVE Final    Comment: (NOTE) The Xpert SA Assay (FDA approved for NASAL specimens in patients 72 years of age and older), is one component of a comprehensive surveillance program. It is not intended to diagnose infection nor to guide or monitor treatment. Performed at [facility], [place] [address], [place], [place] [address]   Aerobic/Anaerobic Culture (surgical/deep wound)     Status: None (Preliminary result)   Collection Time: 02/23/20  2:21 PM   Specimen: PATH Soft tissue  Result Value Ref Range Status   Specimen Description   Final    TISSUE LEFT FEMORAL INTERFACE MEMBRANE Performed at [facility] [address], [place], [place] [address]    Special Requests   Final    NONE Performed at [facility], [place] [address], [place], [place] [address]    Gram Stain   Final    RARE WBC PRESENT, PREDOMINANTLY MONONUCLEAR NO ORGANISMS SEEN Performed at [facility], [place] [address], [place], [place] [address]    Culture   Final    FEW STAPHYLOCOCCUS AUREUS NO ANAEROBES ISOLATED; CULTURE IN PROGRESS FOR 5 DAYS    Report Status PENDING  Incomplete  Aerobic/Anaerobic Culture (surgical/deep wound)     Status: None (Preliminary result)   Collection Time: 02/23/20  2:29 PM   Specimen: PATH Other; Tissue  Result Value Ref Range Status   Specimen Description   Final    WOUND LEFT HIP SUPERFICIAL FLUID Performed at [facility], [place] [address], [place], [place] [address]    Special Requests   Final    NONE Performed at [facility], [place] [address] [place], [place] [address]    Gram Stain   Final    RARE WBC PRESENT, PREDOMINANTLY MONONUCLEAR NO ORGANISMS SEEN Performed at [facility], [place] [address] [place], [place] [address]    Culture FEW STAPHYLOCOCCUS AUREUS  Final   Report Status PENDING  Incomplete   Culture, blood (routine x 2)     Status: None (Preliminary result)   Collection Time: 02/24/20  9:31 AM   Specimen: BLOOD LEFT HAND  Result Value Ref Range  Status   Specimen Description   Final    BLOOD LEFT HAND Performed at [facility] [address], [place], [place] [address]    Special Requests   Final    BOTTLES DRAWN AEROBIC ONLY Blood Culture results may not be optimal due to an inadequate volume of blood received in culture bottles Performed at [facility] [address] [place], [place] [address]    Culture   Final    NO GROWTH < 24 HOURS Performed at [facility] [address] [place], [place] [address]    Report Status PENDING  Incomplete  Culture, blood (routine x 2)     Status: None (Preliminary result)   Collection Time: 02/24/20  9:31 AM   Specimen: BLOOD LEFT HAND  Result Value Ref Range Status   Specimen Description   Final    BLOOD LEFT HAND Performed at [facility] [address], [place], [place] [address]    Special Requests   Final    BOTTLES DRAWN AEROBIC ONLY Blood Culture adequate volume Performed at [facility] [address] [place], [place] [address]    Culture   Final    NO GROWTH < 24 HOURS Performed at [facility] [address], [place], [place] [address]    Report Status PENDING  Incomplete         Radiology Studies: US RENAL  Result Date: 02/25/2020 CLINICAL DATA:  Acute kidney injury, type II diabetes mellitus, hypertension EXAM: RENAL / URINARY TRACT ULTRASOUND COMPLETE COMPARISON:  CT abdomen and pelvis 09/14/2018 FINDINGS: Right Kidney: Renal measurements: 11.8 x 5.4 x 6.0 cm = volume: 2 L1 mL. Marked cortical thinning. Increased cortical echogenicity. Tiny cyst at inferior pole 13 x 11 x 9 mm. No additional mass, hydronephrosis, or shadowing calcification. Left Kidney: Obscured by medical brace, unable to visualize Bladder: Well  distended, unremarkable. Other: N/A IMPRESSION: Nonvisualization of LEFT kidney due to medical brace. Marked cortical thinning and medical renal disease changes of the RIGHT kidney. Tiny RIGHT renal cyst. No evidence of hydronephrosis or solid mass. Electronically Signed   By: Lavonia Dana M.D.   On: 02/25/2020 19:18        Scheduled Meds: . sodium chloride   Intravenous Once  . sodium chloride   Intravenous Once  . sodium chloride   Intravenous Once  . sodium chloride   Intravenous Once  . allopurinol  300 mg Oral BID  . aspirin  81 mg Oral BID  . atorvastatin  80 mg Oral QPM  . carvedilol  6.25 mg Oral BID WC  . Chlorhexidine Gluconate Cloth  6 each Topical Q0600  . docusate sodium  100 mg Oral BID  . insulin aspart  0-5 Units Subcutaneous QHS  . insulin aspart  0-9 Units Subcutaneous TID WC  . metFORMIN  500 mg Oral BID WC  . mupirocin ointment  1 application Nasal BID  . nutrition supplement (JUVEN)  1 packet Oral BID BM  . Ensure Max Protein  11 oz Oral Daily  . senna  1 tablet Oral BID   Continuous Infusions: . sodium chloride Stopped (02/24/20 1200)  .  ceFAZolin (ANCEF) IV 2 g (02/26/20 1572)  . methocarbamol (ROBAXIN) IV Stopped (02/23/20 1656)  . vancomycin 1,000 mg (02/26/20 0530)     LOS: 3 days     Georgette Shell, MD 02/26/2020, 10:06 AM

## 2020-02-26 NOTE — Plan of Care (Signed)
  Problem: Education: Goal: Knowledge of the prescribed therapeutic regimen will improve Outcome: Progressing   Problem: Activity: Goal: Ability to avoid complications of mobility impairment will improve Outcome: Progressing   Problem: Pain Management: Goal: Pain level will decrease with appropriate interventions Outcome: Progressing   

## 2020-02-26 NOTE — Plan of Care (Signed)
  Problem: Clinical Measurements: Goal: Will remain free from infection 02/26/2020 1522 by Olen Cordial, RN Outcome: Adequate for Discharge 02/26/2020 1521 by Olen Cordial, RN Outcome: Progressing 02/26/2020 1516 by Olen Cordial, RN Outcome: Adequate for Discharge 02/26/2020 0857 by Olen Cordial, RN Outcome: Progressing

## 2020-02-26 NOTE — Progress Notes (Signed)
Physical Therapy Treatment Patient Details Name: Justin Hall MRN: [id number] DOB: [birth year] Today's Date: 02/26/2020    History of Present Illness Patient is 55 y.o. male admitted with L THR peri-prothetic infection and now s/p I&D.  Pt s/p Lt THA via posterolateral approach on 01/23/20 with PMH significant for OA, HTN, DM, CHF, depression, anxiety.    PT Comments    Pt continues motivated and progressing steadily with mobility tasks including bed mobility without use of rails or HOB elevation to assist.  Stairs deferred this pm at pt request.   Follow Up Recommendations  Follow surgeon's recommendation for DC plan and follow-up therapies;Other (comment)     Equipment Recommendations  None recommended by PT    Recommendations for Other Services OT consult     Precautions / Restrictions Precautions Precautions: Fall;Posterior Hip Precaution Comments: pt recalled 3/3 THP so reducated pt and spouse Restrictions Weight Bearing Restrictions: No Other Position/Activity Restrictions: WBAT    Mobility  Bed Mobility Overal bed mobility: Needs Assistance Bed Mobility: Supine to Sit;Sit to Supine     Supine to sit: Min assist Sit to supine: Min assist   General bed mobility comments: Increased time with cues for sequence and use of R LE to self assist.  Physical assist to manage L LE on/off of bed and min assist to bring trunk up from supine.  Perfomed x 2  Transfers Overall transfer level: Needs assistance Equipment used: Rolling walker (2 wheeled) Transfers: Sit to/from Stand Sit to Stand: Min guard;Supervision Stand pivot transfers: Supervision       General transfer comment: cues for LE management and use of UEs to self assist  Ambulation/Gait Ambulation/Gait assistance: Min guard;Supervision Gait Distance (Feet): 140 Feet Assistive device: Rolling walker (2 wheeled) Gait Pattern/deviations: Step-to pattern;Decreased stride length;Decreased weight shift to left Gait  velocity: decreased   General Gait Details: Increased time with min cues for sequence, ER on L, posture and position from RW   Stairs Stairs:  (Pt declines "I'm just not ready yet")           Wheelchair Mobility    Modified Rankin (Stroke Patients Only)       Balance Overall balance assessment: Needs assistance Sitting-balance support: Feet supported;No upper extremity supported Sitting balance-Leahy Scale: Good     Standing balance support: During functional activity;Bilateral upper extremity supported Standing balance-Leahy Scale: Fair                              Cognition Arousal/Alertness: Awake/alert Behavior During Therapy: WFL for tasks assessed/performed Overall Cognitive Status: Within Functional Limits for tasks assessed                                 General Comments: AxIO x 3 very motivated      Exercises      General Comments        Pertinent Vitals/Pain Pain Assessment: 0-10 Pain Score: 6  Pain Location: LEFT hip/buttock Pain Descriptors / Indicators: Aching;Sore Pain Intervention(s): Limited activity within patient's tolerance;Monitored during session;Premedicated before session    Home Living                      Prior Function            PT Goals (current goals can now be found in the care plan section) Acute Rehab PT Goals Patient Stated  Goal: stop hurting and get home PT Goal Formulation: With patient/family Time For Goal Achievement: 01/30/20 Potential to Achieve Goals: Good Progress towards PT goals: Progressing toward goals    Frequency    7X/week      PT Plan Current plan remains appropriate    Co-evaluation              AM-PAC PT "6 Clicks" Mobility   Outcome Measure  Help needed turning from your back to your side while in a flat bed without using bedrails?: A Lot Help needed moving from lying on your back to sitting on the side of a flat bed without using bedrails?:  A Little Help needed moving to and from a bed to a chair (including a wheelchair)?: A Little Help needed standing up from a chair using your arms (e.g., wheelchair or bedside chair)?: A Little Help needed to walk in hospital room?: A Little Help needed climbing 3-5 steps with a railing? : A Lot 6 Click Score: 16    End of Session Equipment Utilized During Treatment: Gait belt Activity Tolerance: Patient tolerated treatment well;Patient limited by pain Patient left: with call bell/phone within reach;with family/visitor present;in bed Nurse Communication: Mobility status PT Visit Diagnosis: Muscle weakness (generalized) (M62.81);Difficulty in walking, not elsewhere classified (R26.2)     Time: 1427-6701 PT Time Calculation (min) (ACUTE ONLY): 37 min  Charges:  $Gait Training: 8-22 mins $Therapeutic Activity: 8-22 mins                     Debe Coder PT Acute Rehabilitation Services Pager [phone] Office [phone]    Justin Hall 02/26/2020, 5:31 PM

## 2020-02-26 NOTE — Progress Notes (Signed)
Orthopedics Progress Note  Subjective: Patient feels weak and is hurting now more than before surgery.   Objective:  Vitals:   02/25/20 2013 02/26/20 0635  BP: (!) 146/89 (!) 155/93  Pulse: 81 89  Resp: 18 18  Temp: 98 F (36.7 C) 97.9 F (36.6 C)  SpO2: 94% 96%    General: Awake and alert  Musculoskeletal: minimal swelling in left leg, compartments supple. JP drain and wound vac both functioning well.  Neurovascularly intact  Lab Results  Component Value Date   WBC 12.3 (H) 02/26/2020   HGB 8.5 (L) 02/26/2020   HCT 27.0 (L) 02/26/2020   MCV 83.9 02/26/2020   PLT 414 (H) 02/26/2020       Component Value Date/Time   NA 137 02/26/2020 0318   NA 142 07/14/2019 1247   K 4.3 02/26/2020 0318   CL 100 02/26/2020 0318   CO2 25 02/26/2020 0318   GLUCOSE 93 02/26/2020 0318   BUN 41 (H) 02/26/2020 0318   BUN 18 07/14/2019 1247   CREATININE 2.14 (H) 02/26/2020 0318   CALCIUM 8.0 (L) 02/26/2020 0318   GFRNONAA 35 (L) 02/26/2020 0318   GFRAA >60 01/24/2020 0404    Lab Results  Component Value Date   INR 1.1 02/23/2020   INR 1.0 01/16/2020   INR 0.96 06/01/2017    Assessment/Plan: POD #3 s/p Procedure(s): IRRIGATION AND DEBRIDEMENT left hip, HEAD BALL AND LINER CHANGE Patient making progress with therapy. He is nervous about going home. Medicine Consult appreciated. Hgb improved after 2 units PRBCs yesterday. Creatinine still up but a little better, may have to adjust Vanc dosing. Will consult pharmacy.  Drains per Dr Lyla Glassing. D/C once medically stable  Doran Heater. Veverly Fells, MD 02/26/2020 8:32 AM

## 2020-02-26 NOTE — Plan of Care (Signed)
Plan of care reviewed and discussed with the patient. 

## 2020-02-26 NOTE — Progress Notes (Signed)
Physical Therapy Treatment Patient Details Name: Justin Hall MRN: [id number] DOB: 1965-11-21 Today's Date: 02/26/2020    History of Present Illness Patient is 54 y.o. male admitted with L THR peri-prothetic infection and now s/p I&D.  Pt s/p Lt THA via posterolateral approach on 01/23/20 with PMH significant for OA, HTN, DM, CHF, depression, anxiety.    PT Comments    Pt continues motivated and progressing with mobility.   Follow Up Recommendations  Follow surgeon's recommendation for DC plan and follow-up therapies;Other (comment)     Equipment Recommendations  None recommended by PT    Recommendations for Other Services OT consult     Precautions / Restrictions Precautions Precautions: Fall;Posterior Hip Precaution Comments: pt recalled 2/3 THP so reducated pt and spouse Restrictions Weight Bearing Restrictions: No Other Position/Activity Restrictions: WBAT    Mobility  Bed Mobility               General bed mobility comments: Pt up in standing with nursing for bathroom  Transfers Overall transfer level: Needs assistance Equipment used: Rolling walker (2 wheeled) Transfers: Sit to/from Stand Sit to Stand: Min guard;Supervision         General transfer comment: cues for LE management and use of UEs to self assist  Ambulation/Gait Ambulation/Gait assistance: Min guard Gait Distance (Feet): 200 Feet Assistive device: Rolling walker (2 wheeled) Gait Pattern/deviations: Step-to pattern;Decreased stride length;Decreased weight shift to left Gait velocity: decreased   General Gait Details: Increased time with min cues for sequence, ER on L, posture and position from [employer]             Wheelchair Mobility    Modified Rankin (Stroke Patients Only)       Balance Overall balance assessment: Needs assistance Sitting-balance support: Feet supported;No upper extremity supported Sitting balance-Leahy Scale: Good     Standing balance support:  During functional activity;Bilateral upper extremity supported Standing balance-Leahy Scale: Fair                              Cognition Arousal/Alertness: Awake/alert Behavior During Therapy: WFL for tasks assessed/performed Overall Cognitive Status: Within Functional Limits for tasks assessed                                 General Comments: AxIO x 3 very motivated      Exercises Total Joint Exercises Ankle Circles/Pumps: AROM;Both;15 reps;Seated Quad Sets: AROM;Both;10 reps;Supine    General Comments        Pertinent Vitals/Pain Pain Assessment: 0-10 Pain Score: 6  Pain Location: LEFT hip/buttock Pain Descriptors / Indicators: Aching;Sore Pain Intervention(s): Limited activity within patient's tolerance;Monitored during session;Premedicated before session    Home Living                      Prior Function            PT Goals (current goals can now be found in the care plan section) Acute Rehab PT Goals Patient Stated Goal: stop hurting and get home PT Goal Formulation: With patient/family Time For Goal Achievement: 01/30/20 Potential to Achieve Goals: Good Progress towards PT goals: Progressing toward goals    Frequency    7X/week      PT Plan Current plan remains appropriate    Co-evaluation              AM-PAC  PT "6 Clicks" Mobility   Outcome Measure  Help needed turning from your back to your side while in a flat bed without using bedrails?: A Lot Help needed moving from lying on your back to sitting on the side of a flat bed without using bedrails?: A Little Help needed moving to and from a bed to a chair (including a wheelchair)?: A Little Help needed standing up from a chair using your arms (e.g., wheelchair or bedside chair)?: A Little Help needed to walk in hospital room?: A Little Help needed climbing 3-5 steps with a railing? : A Lot 6 Click Score: 16    End of Session Equipment Utilized During  Treatment: Gait belt Activity Tolerance: Patient tolerated treatment well;Patient limited by pain Patient left: with call bell/phone within reach;with family/visitor present;in bed Nurse Communication: Mobility status PT Visit Diagnosis: Muscle weakness (generalized) (M62.81);Difficulty in walking, not elsewhere classified (R26.2)     Time: 3810-1751 PT Time Calculation (min) (ACUTE ONLY): 29 min  Charges:  $Gait Training: 23-37 mins                     [place] Pager [phone] Office [phone]    Andric Kerce 02/26/2020, 5:24 PM

## 2020-02-27 ENCOUNTER — Inpatient Hospital Stay: Payer: Self-pay

## 2020-02-27 ENCOUNTER — Encounter (HOSPITAL_COMMUNITY): Payer: Self-pay | Admitting: Orthopedic Surgery

## 2020-02-27 DIAGNOSIS — Z96649 Presence of unspecified artificial hip joint: Secondary | ICD-10-CM

## 2020-02-27 DIAGNOSIS — T8459XA Infection and inflammatory reaction due to other internal joint prosthesis, initial encounter: Secondary | ICD-10-CM

## 2020-02-27 DIAGNOSIS — N179 Acute kidney failure, unspecified: Secondary | ICD-10-CM | POA: Diagnosis not present

## 2020-02-27 DIAGNOSIS — Z09 Encounter for follow-up examination after completed treatment for conditions other than malignant neoplasm: Secondary | ICD-10-CM | POA: Diagnosis not present

## 2020-02-27 LAB — CBC
HCT: 26.5 % — ABNORMAL LOW (ref 39.0–52.0)
Hemoglobin: 8.3 g/dL — ABNORMAL LOW (ref 13.0–17.0)
MCH: 26.4 pg (ref 26.0–34.0)
MCHC: 31.3 g/dL (ref 30.0–36.0)
MCV: 84.4 fL (ref 80.0–100.0)
Platelets: 375 10*3/uL (ref 150–400)
RBC: 3.14 MIL/uL — ABNORMAL LOW (ref 4.22–5.81)
RDW: 15.7 % — ABNORMAL HIGH (ref 11.5–15.5)
WBC: 9.9 10*3/uL (ref 4.0–10.5)
nRBC: 0 % (ref 0.0–0.2)

## 2020-02-27 LAB — GLUCOSE, CAPILLARY
Glucose-Capillary: 117 mg/dL — ABNORMAL HIGH (ref 70–99)
Glucose-Capillary: 105 mg/dL — ABNORMAL HIGH (ref 70–99)
Glucose-Capillary: 101 mg/dL — ABNORMAL HIGH (ref 70–99)
Glucose-Capillary: 152 mg/dL — ABNORMAL HIGH (ref 70–99)

## 2020-02-27 LAB — COMPREHENSIVE METABOLIC PANEL
ALT: 6 U/L (ref 0–44)
AST: 12 U/L — ABNORMAL LOW (ref 15–41)
Albumin: 2.8 g/dL — ABNORMAL LOW (ref 3.5–5.0)
Alkaline Phosphatase: 68 U/L (ref 38–126)
Anion gap: 7 (ref 5–15)
BUN: 35 mg/dL — ABNORMAL HIGH (ref 6–20)
CO2: 27 mmol/L (ref 22–32)
Calcium: 8.3 mg/dL — ABNORMAL LOW (ref 8.9–10.3)
Chloride: 104 mmol/L (ref 98–111)
Creatinine, Ser: 1.68 mg/dL — ABNORMAL HIGH (ref 0.61–1.24)
GFR, Estimated: 47 mL/min — ABNORMAL LOW (ref >60)
Glucose, Bld: 121 mg/dL — ABNORMAL HIGH (ref 70–99)
Potassium: 4.1 mmol/L (ref 3.5–5.1)
Sodium: 138 mmol/L (ref 135–145)
Total Bilirubin: 0.5 mg/dL (ref 0.3–1.2)
Total Protein: 6.3 g/dL — ABNORMAL LOW (ref 6.5–8.1)

## 2020-02-27 MED ORDER — SODIUM CHLORIDE 0.9% FLUSH: 10.0000 mL | INTRAVENOUS | Status: DC | PRN

## 2020-02-27 MED ORDER — SODIUM CHLORIDE 0.9% FLUSH
10.0000 mL | Freq: Two times a day (BID) | INTRAVENOUS | Status: DC
  Administered 2020-02-28: 10 mL

## 2020-02-27 MED ORDER — SODIUM CHLORIDE 0.9 % IV SOLN: INTRAVENOUS | Status: DC | PRN

## 2020-02-27 MED ORDER — FUROSEMIDE 10 MG/ML IJ SOLN
20.0000 mg | Freq: Every day | INTRAMUSCULAR | Status: DC
  Administered 2020-02-27 – 2020-02-28 (×2): 20 mg via INTRAVENOUS
  Filled 2020-02-27 (×2): qty 2

## 2020-02-27 MED ORDER — RIFAMPIN 300 MG PO CAPS
300.0000 mg | ORAL_CAPSULE | Freq: Two times a day (BID) | ORAL | Status: DC
  Administered 2020-02-27 – 2020-02-28 (×3): 300 mg via ORAL
  Filled 2020-02-27 (×3): qty 1

## 2020-02-27 NOTE — Progress Notes (Signed)
Subjective:  Patient reports pain as mild to moderate.  Denies N/V/CP/SOB.   Objective:   VITALS:   Vitals:   02/26/20 1342 02/26/20 2119 02/26/20 2236 02/27/20 0541  BP: (!) 149/97 (!) 161/89  139/85  Pulse: 86 90 89 92  Resp: 18 18 20 14   Temp:  98 F (36.7 C)  98 F (36.7 C)  TempSrc:    Oral  SpO2: 97% 94% 94% 96%  Weight:      Height:        NAD ABD soft Neurovascular intact Sensation intact distally Intact pulses distally Dorsiflexion/Plantar flexion intact Incision: dressing C/D/I Vacuum currently active  Lab Results  Component Value Date   WBC 9.9 02/27/2020   HGB 8.3 (L) 02/27/2020   HCT 26.5 (L) 02/27/2020   MCV 84.4 02/27/2020   PLT 375 02/27/2020   BMET    Component Value Date/Time   NA 138 02/27/2020 0314   NA 142 07/14/2019 1247   K 4.1 02/27/2020 0314   CL 104 02/27/2020 0314   CO2 27 02/27/2020 0314   GLUCOSE 121 (H) 02/27/2020 0314   BUN 35 (H) 02/27/2020 0314   BUN 18 07/14/2019 1247   CREATININE 1.68 (H) 02/27/2020 0314   CALCIUM 8.3 (L) 02/27/2020 0314   GFRNONAA 47 (L) 02/27/2020 0314   GFRAA >60 01/24/2020 0404   Recent Results (from the past 240 hour(s))  SARS CORONAVIRUS 2 (TAT 6-24 HRS) Nasopharyngeal Nasopharyngeal Swab     Status: None   Collection Time: 02/22/20  2:34 PM   Specimen: Nasopharyngeal Swab  Result Value Ref Range Status   SARS Coronavirus 2 NEGATIVE NEGATIVE Final    Comment: (NOTE) SARS-CoV-2 target nucleic acids are NOT DETECTED.  The SARS-CoV-2 RNA is generally detectable in upper and lower respiratory specimens during the acute phase of infection. Negative results do not preclude SARS-CoV-2 infection, do not rule out co-infections with other pathogens, and should not be used as the sole basis for treatment or other patient management decisions. Negative results must be combined with clinical observations, patient history, and epidemiological information. The expected result is Negative.  Fact  Sheet for Patients: SugarRoll.be  Fact Sheet for Healthcare Providers: https://www.woods-mathews.com/  This test is not yet approved or cleared by the [place] FDA and  has been authorized for detection and/or diagnosis of SARS-CoV-2 by FDA under an Emergency Use Authorization (EUA). This EUA will remain  in effect (meaning this test can be used) for the duration of the COVID-19 declaration under Se ction 564(b)(1) of the Act, 21 U.S.C. section 360bbb-3(b)(1), unless the authorization is terminated or revoked sooner.  Performed at [facility], [place] [address], [place], [place] [address]   Surgical pcr screen     Status: Abnormal   Collection Time: 02/23/20 10:35 AM   Specimen: Nasal Mucosa; Nasal Swab  Result Value Ref Range Status   MRSA, PCR POSITIVE (A) NEGATIVE Final    Comment: RESULT CALLED TO, READ BACK BY AND VERIFIED WITH: GIGLIOTTI,K. @1447  ON 11.04.2021 BY COHEN,K    Staphylococcus aureus POSITIVE (A) NEGATIVE Final    Comment: (NOTE) The Xpert SA Assay (FDA approved for NASAL specimens in patients 44 years of age and older), is one component of a comprehensive surveillance program. It is not intended to diagnose infection nor to guide or monitor treatment. Performed at [facility], [place] [address] [place], [place] [address]   Aerobic/Anaerobic Culture (surgical/deep wound)     Status: None (Preliminary  result)   Collection Time: 02/23/20  2:21 PM   Specimen: PATH Soft tissue  Result Value Ref Range Status   Specimen Description TISSUE LEFT FEMORAL INTERFACE MEMBRANE  Final   Special Requests NONE  Final   Gram Stain   Final    RARE WBC PRESENT, PREDOMINANTLY MONONUCLEAR NO ORGANISMS SEEN    Culture   Final    FEW STAPHYLOCOCCUS AUREUS NO ANAEROBES ISOLATED; CULTURE IN PROGRESS FOR 5 DAYS    Report Status PENDING  Incomplete   Organism ID, Bacteria STAPHYLOCOCCUS AUREUS  Final       Susceptibility   Staphylococcus aureus - MIC*    CIPROFLOXACIN <=0.5 SENSITIVE Sensitive     ERYTHROMYCIN <=0.25 SENSITIVE Sensitive     GENTAMICIN <=0.5 SENSITIVE Sensitive     OXACILLIN 0.5 SENSITIVE Sensitive     TETRACYCLINE >=16 RESISTANT Resistant     VANCOMYCIN 1 SENSITIVE Sensitive     TRIMETH/SULFA <=10 SENSITIVE Sensitive     CLINDAMYCIN <=0.25 SENSITIVE Sensitive     RIFAMPIN <=0.5 SENSITIVE Sensitive     Inducible Clindamycin Value in next row Sensitive      NEGATIVEPerformed at [facility] [address] [place], [place] [address]    * FEW STAPHYLOCOCCUS AUREUS  Aerobic/Anaerobic Culture (surgical/deep wound)     Status: None (Preliminary result)   Collection Time: 02/23/20  2:29 PM   Specimen: PATH Other; Tissue  Result Value Ref Range Status   Specimen Description WOUND LEFT HIP SUPERFICIAL FLUID  Final   Special Requests NONE  Final   Gram Stain   Final    RARE WBC PRESENT, PREDOMINANTLY MONONUCLEAR NO ORGANISMS SEEN    Culture FEW STAPHYLOCOCCUS AUREUS  Final   Report Status PENDING  Incomplete   Organism ID, Bacteria STAPHYLOCOCCUS AUREUS  Final      Susceptibility   Staphylococcus aureus - MIC*    CIPROFLOXACIN <=0.5 SENSITIVE Sensitive     ERYTHROMYCIN <=0.25 SENSITIVE Sensitive     GENTAMICIN <=0.5 SENSITIVE Sensitive     OXACILLIN 0.5 SENSITIVE Sensitive     TETRACYCLINE >=16 RESISTANT Resistant     VANCOMYCIN 1 SENSITIVE Sensitive     TRIMETH/SULFA <=10 SENSITIVE Sensitive     CLINDAMYCIN <=0.25 SENSITIVE Sensitive     RIFAMPIN <=0.5 SENSITIVE Sensitive     Inducible Clindamycin Value in next row Sensitive      NEGATIVEPerformed at [place] [address], [place], [place] [address]    * FEW STAPHYLOCOCCUS AUREUS  Culture, blood (routine x 2)     Status: None (Preliminary result)   Collection Time: 02/24/20  9:31 AM   Specimen: BLOOD LEFT HAND  Result Value Ref Range Status   Specimen Description   Final    BLOOD LEFT  HAND Performed at [facility] [address] [place], [place] [address]    Special Requests   Final    BOTTLES DRAWN AEROBIC ONLY Blood Culture results may not be optimal due to an inadequate volume of blood received in culture bottles Performed at [facility] [address] [place], [place] [address]    Culture   Final    NO GROWTH 2 DAYS Performed at [facility] [address] [place], [place] [address]    Report Status PENDING  Incomplete  Culture, blood (routine x 2)     Status: None (Preliminary result)   Collection Time: 02/24/20  9:31 AM   Specimen: BLOOD LEFT HAND  Result Value Ref Range Status   Specimen Description   Final    BLOOD LEFT HAND Performed at [facility] [address], [place], [place] [address]    Special Requests   Final    BOTTLES DRAWN AEROBIC ONLY Blood Culture adequate volume Performed at [facility] [address], [place], [place] [address]    Culture   Final    NO GROWTH 2 DAYS Performed at [facility] [address] [place], [place] [address]    Report Status PENDING  Incomplete    Assessment/Plan: 4 Days Post-Op   Active Problems:   AKI (acute kidney injury) ([place])   Infection of prosthetic total hip joint ([place])   Postop check   WBAT with walker.  Abduction brace should be worn for six weeks DVT ppx: Aspirin, SCDs, TEDS PO pain control PT/OT Convert house vac to portable vac at discharge Dispo: D/C home once cleared by PT and antibiotic plan is determined. F/U with Dr. Lyla Glassing one week after discharge for vacuum removal.    Justin Hall 02/27/2020, 8:19 AM  [facility] is now [facility] [address] [place], [place],  [address] Phone: [phone] [email or web address] Facebook  [employer]

## 2020-02-27 NOTE — Progress Notes (Signed)
Physical Therapy Treatment Patient Details Name: Justin Hall MRN: [id number] DOB: 1965-11-15 Today's Date: 02/27/2020    History of Present Illness Patient is 54 y.o. male admitted with L THR peri-prothetic infection and now s/p I&D.  Pt s/p Lt THA via posterolateral approach on 01/23/20 with PMH significant for OA, HTN, DM, CHF, depression, anxiety.    PT Comments    Pt received awake in chair, ready for therapy. Pain reported at a 6/10. He requires min G to stand for safety due to pain. Pt very thorough; repeats instructions from previous sessions while simultaneously performing transfers and bed mobility. Min G for safety with gait for 268ft in hallway. He demos a decreased gait velocity due to pain, but has good endurance, requests to go further. Slight internal rotation of L LE throughout session; already been addressed by physical therapist. S/o minor fatigue after gait, increased pain to an 8/10. Min A for sit to supine to assist with getting L LE onto bed. Pt recalls all of his exercises from the initial surgery last month; he is re-informed and performs well. Hand towels are placed inside of his brace to wick away sweat.  Follow Up Recommendations  Follow surgeon's recommendation for DC plan and follow-up therapies;Other (comment)     Equipment Recommendations  None recommended by PT    Recommendations for Other Services OT consult     Precautions / Restrictions Precautions Precautions: Fall;Posterior Hip Precaution Comments: pt recalled 3/3 THP so reducated pt and spouse Restrictions Weight Bearing Restrictions: No Other Position/Activity Restrictions: WBAT    Mobility  Bed Mobility Overal bed mobility: Needs Assistance Bed Mobility: Sit to Supine     Sit to supine: Min assist   General bed mobility comments: Increased time with cues for sequence and use of R LE to self assist.  Physical assist to manage L LE on/off of bed and min assist to bring trunk up from supine.   Perfomed x 2  Transfers Overall transfer level: Needs assistance Equipment used: Rolling walker (2 wheeled) Transfers: Sit to/from [employer] Sit to Stand: Min guard;Supervision Stand pivot transfers: Supervision          Ambulation/Gait Ambulation/Gait assistance: Min guard;Supervision Gait Distance (Feet): 200 Feet Assistive device: Rolling walker (2 wheeled) Gait Pattern/deviations: Step-to pattern;Decreased stride length Gait velocity: decreased       Stairs             Wheelchair Mobility    Modified Rankin (Stroke Patients Only)       Balance                                            Cognition Arousal/Alertness: Awake/alert Behavior During Therapy: WFL for tasks assessed/performed Overall Cognitive Status: Within Functional Limits for tasks assessed                                 General Comments: AxO x 3 very motivated      Exercises Total Joint Exercises Ankle Circles/Pumps: AROM;Both;Supine;10 reps Quad Sets: AROM;Both;Supine;5 reps Gluteal Sets: AROM;Supine;Both;5 reps Short Arc QuadSinclair Hall;Left;5 reps;Supine Heel Slides: Supine;Left;AAROM;5 reps    General Comments        Pertinent Vitals/Pain Pain Assessment: 0-10 Pain Score: 6  Pain Location: LEFT hip/buttock Pain Descriptors / Indicators: Aching;Sore Pain Intervention(s): Monitored during session  Home Living                      Prior Function            PT Goals (current goals can now be found in the care plan section) Acute Rehab PT Goals Patient Stated Goal: stop hurting and get home PT Goal Formulation: With patient/family Time For Goal Achievement: 01/30/20 Potential to Achieve Goals: Good Progress towards PT goals: Progressing toward goals    Frequency    7X/week      PT Plan Current plan remains appropriate    Co-evaluation              AM-PAC PT "6 Clicks" Mobility   Outcome  Measure  Help needed turning from your back to your side while in a flat bed without using bedrails?: A Lot Help needed moving from lying on your back to sitting on the side of a flat bed without using bedrails?: A Little Help needed moving to and from a bed to a chair (including a wheelchair)?: A Little Help needed standing up from a chair using your arms (e.g., wheelchair or bedside chair)?: A Little Help needed to walk in hospital room?: A Little Help needed climbing 3-5 steps with a railing? : A Lot 6 Click Score: 16    End of Session Equipment Utilized During Treatment: Gait belt Activity Tolerance: Patient tolerated treatment well Patient left: with call bell/phone within reach;in bed Nurse Communication: Mobility status PT Visit Diagnosis: Muscle weakness (generalized) (M62.81);Difficulty in walking, not elsewhere classified (R26.2)     Time: 1026-1100 PT Time Calculation (min) (ACUTE ONLY): 34 min  Charges:  $Gait Training: 8-22 mins $Therapeutic Exercise: 8-22 mins                     C. Parks [place], [place] [facility] [phone]

## 2020-02-27 NOTE — Progress Notes (Signed)
PROGRESS NOTE    Justin Hall  TLX:[id number] DOB: May 15, 1965 DOA: 02/23/2020 PCP: [name], DO   Brief Narrative:Justin Hall is a 54 y.o. male with medical history significant of HFpEF, DM2, HTN. Presenting for I&D of left hip. He had a left total hip performed on 01/23/20. During his 1st post-operative follow up a couple weeks later, he was noted to have a hematoma. At his subsequent post-op follow up, he was noted to have purulent drainage from an incision site. Initially, an IR-guided drainage attempt was made. However, it was unsuccessful. He was then taken to the OR for surgical exploration. He successfully completed that surgery and had about 1000cc of blood/puss removed. He was started on ancef and vacn w/ pharm dosing. Over the last couple of days, his Scr has risen. TRH was consulted for this issue.  Assessment & Plan:   Active Problems:   AKI (acute kidney injury) ([place])   Infection of prosthetic total hip joint ([place])   Postop check   #1 infected left prosthetic hip status post left hip washout.  Per Ortho.  On Ancef.  #2 AKI on CKD stage IIIa-holding Lasix Metformin and lisinopril.  Started on Ancef since wound culture is growing staph aureus no evidence of MRSA. Renal function improving 1.68 from 2.14  He was on lasix 40 bid at home  Will start lasix 20 mg qd Renal ultrasound shows no evidence of hydronephrosis.  Left kidney was not visualized well the right kidney shows medical renal disease.  #3 type 2 diabetes he is on Metformin at home which I am going to hold due to elevated creatinine. CBG (last 3)  Recent Labs    02/26/20 2118 02/27/20 0720 02/27/20 1201  GLUCAP 121* 117* 105*     #4 history of essential hypertension blood pressure 155/93.  Increase Coreg to 12.5 twice a day.  #5 chronic diastolic heart failure patient euvolemic-restart lasix at 20 mg qd  #6 acute blood loss anemia status post 2 units of packed RBCs.  Hemoglobin 8.5 from  6.5.  #7 hyperkalemia resolved.  Nutrition Problem: Increased nutrient needs Etiology: wound healing     Signs/Symptoms: estimated needs    Interventions: Ju[place], Refer to RD note for recommendations  Estimated body mass index is 43.74 kg/m as calculated from the following:   Height as of this encounter: 5' 9.5" (1.765 m).   Weight as of this encounter: 136.3 kg.  Subjective: Patient resting in bed he denies any shortness of breath chest pain nausea vomiting.  He is anxious to see the surgeon.  He feels stronger than yesterday.  He reports that he is having good urine output.  Objective: Vitals:   02/26/20 1342 02/26/20 2119 02/26/20 2236 02/27/20 0541  BP: (!) 149/97 (!) 161/89  139/85  Pulse: 86 90 89 92  Resp: 18 18 20 14   Temp:  98 F (36.7 C)  98 F (36.7 C)  TempSrc:    Oral  SpO2: 97% 94% 94% 96%  Weight:      Height:        Intake/Output Summary (Last 24 hours) at 02/27/2020 1228 Last data filed at 02/27/2020 1159 Gross per 24 hour  Intake 2686.67 ml  Output 2033 ml  Net 653.67 ml   Filed Weights   02/23/20 1025  Weight: (!) 136.3 kg    Examination:  General exam: Appears calm and comfortable  Respiratory system: Clear to auscultation. Respiratory effort normal. Cardiovascular system: S1 & S2 heard, RRR. No JVD,  murmurs, rubs, gallops or clicks. No pedal edema. Gastrointestinal system: Abdomen is nondistended, soft and nontender. No organomegaly or masses felt. Normal bowel sounds heard. Central nervous system: Alert and oriented. No focal neurological deficits. Extremities: Left hip drains in place.trace edema b/l Skin: No rashes, lesions or ulcers Psychiatry: Judgement and insight appear normal. Mood & affect appropriate.     Data Reviewed: I have personally reviewed following labs and imaging studies  CBC: Recent Labs  Lab 02/23/20 1045 02/23/20 1425 02/23/20 1708 02/24/20 0317 02/25/20 0404 02/26/20 0318 02/27/20 0314  WBC 9.5  --    --  13.0* 11.2* 12.3* 9.9  HGB 9.5*   < > 8.2* 7.1* 6.5* 8.5* 8.3*  HCT 29.9*   < > 26.3* 22.8* 20.4* 27.0* 26.5*  MCV 81.7  --   --  80.9 82.6 83.9 84.4  PLT 483*  --   --  477* 385 414* 375   < > = values in this interval not displayed.   Basic Metabolic Panel: Recent Labs  Lab 02/23/20 1045 02/23/20 1045 02/23/20 1425 02/24/20 0317 02/25/20 1637 02/26/20 0318 02/27/20 0314  NA 134*   < > 137 134* 135  136 137 138  K 4.4   < > 4.7 5.3* 3.9  3.9 4.3 4.1  CL 97*   < > 99 99 98  98 100 104  CO2 27  --   --  24 23  28 25 27   GLUCOSE 106*   < > 133* 163* 131*  129* 93 121*  BUN 29*   < > 27* 34* 41*  41* 41* 35*  CREATININE 1.85*   < > 1.80* 2.34* 2.34*  2.42* 2.14* 1.68*  CALCIUM 8.5*  --   --  8.2* 8.0*  8.1* 8.0* 8.3*  PHOS  --   --   --   --  4.0 3.8  --    < > = values in this interval not displayed.   GFR: Estimated Creatinine Clearance: 67.8 mL/min (A) (by C-G formula based on SCr of 1.68 mg/dL (H)). Liver Function Tests: Recent Labs  Lab 02/23/20 1045 02/25/20 1637 02/26/20 0318 02/27/20 0314  AST 23  --   --  12*  ALT 17  --   --  6  ALKPHOS 88  --   --  68  BILITOT 0.6  --   --  0.5  PROT 7.9  --   --  6.3*  ALBUMIN 3.0* 2.8* 2.8* 2.8*   No results for input(s): LIPASE, AMYLASE in the last 168 hours. No results for input(s): AMMONIA in the last 168 hours. Coagulation Profile: Recent Labs  Lab 02/23/20 1045  INR 1.1   Cardiac Enzymes: No results for input(s): CKTOTAL, CKMB, CKMBINDEX, TROPONINI in the last 168 hours. BNP (last 3 results) No results for input(s): PROBNP in the last 8760 hours. HbA1C: No results for input(s): HGBA1C in the last 72 hours. CBG: Recent Labs  Lab 02/26/20 1134 02/26/20 1722 02/26/20 2118 02/27/20 0720 02/27/20 1201  GLUCAP 116* 102* 121* 117* 105*   Lipid Profile: No results for input(s): CHOL, HDL, LDLCALC, TRIG, CHOLHDL, LDLDIRECT in the last 72 hours. Thyroid Function Tests: No results for input(s):  TSH, T4TOTAL, FREET4, T3FREE, THYROIDAB in the last 72 hours. Anemia Panel: No results for input(s): VITAMINB12, FOLATE, FERRITIN, TIBC, IRON, RETICCTPCT in the last 72 hours. Sepsis Labs: No results for input(s): PROCALCITON, LATICACIDVEN in the last 168 hours.  Recent Results (from the past 240 hour(s))  SARS CORONAVIRUS 2 (TAT 6-24 HRS) Nasopharyngeal Nasopharyngeal Swab     Status: None   Collection Time: 02/22/20  2:34 PM   Specimen: Nasopharyngeal Swab  Result Value Ref Range Status   SARS Coronavirus 2 NEGATIVE NEGATIVE Final    Comment: (NOTE) SARS-CoV-2 target nucleic acids are NOT DETECTED.  The SARS-CoV-2 RNA is generally detectable in upper and lower respiratory specimens during the acute phase of infection. Negative results do not preclude SARS-CoV-2 infection, do not rule out co-infections with other pathogens, and should not be used as the sole basis for treatment or other patient management decisions. Negative results must be combined with clinical observations, patient history, and epidemiological information. The expected result is Negative.  Fact Sheet for Patients: SugarRoll.be  Fact Sheet for Healthcare Providers: https://www.[facility].com/  This test is not yet approved or cleared by the [place] FDA and  has been authorized for detection and/or diagnosis of SARS-CoV-2 by FDA under an Emergency Use Authorization (EUA). This EUA will remain  in effect (meaning this test can be used) for the duration of the COVID-19 declaration under Se ction 564(b)(1) of the Act, 21 U.S.C. section 360bbb-3(b)(1), unless the authorization is terminated or revoked sooner.  Performed at [facility], [place] [address], [place], [place] [address]   Surgical pcr screen     Status: Abnormal   Collection Time: 02/23/20 10:35 AM   Specimen: Nasal Mucosa; Nasal Swab  Result Value Ref Range Status   MRSA, PCR POSITIVE (A)  NEGATIVE Final    Comment: RESULT CALLED TO, READ BACK BY AND VERIFIED WITH: GIGLIOTTI,K. @1447  ON 11.04.2021 BY COHEN,K    Staphylococcus aureus POSITIVE (A) NEGATIVE Final    Comment: (NOTE) The Xpert SA Assay (FDA approved for NASAL specimens in patients 6 years of age and older), is one component of a comprehensive surveillance program. It is not intended to diagnose infection nor to guide or monitor treatment. Performed at [facility], [place] [address], [place], [place] [address]   Aerobic/Anaerobic Culture (surgical/deep wound)     Status: None (Preliminary result)   Collection Time: 02/23/20  2:21 PM   Specimen: PATH Soft tissue  Result Value Ref Range Status   Specimen Description TISSUE LEFT FEMORAL INTERFACE MEMBRANE  Final   Special Requests NONE  Final   Gram Stain   Final    RARE WBC PRESENT, PREDOMINANTLY MONONUCLEAR NO ORGANISMS SEEN    Culture   Final    FEW STAPHYLOCOCCUS AUREUS NO ANAEROBES ISOLATED; CULTURE IN PROGRESS FOR 5 DAYS    Report Status PENDING  Incomplete   Organism ID, Bacteria STAPHYLOCOCCUS AUREUS  Final      Susceptibility   Staphylococcus aureus - MIC*    CIPROFLOXACIN <=0.5 SENSITIVE Sensitive     ERYTHROMYCIN <=0.25 SENSITIVE Sensitive     GENTAMICIN <=0.5 SENSITIVE Sensitive     OXACILLIN 0.5 SENSITIVE Sensitive     TETRACYCLINE >=16 RESISTANT Resistant     VANCOMYCIN 1 SENSITIVE Sensitive     TRIMETH/SULFA <=10 SENSITIVE Sensitive     CLINDAMYCIN <=0.25 SENSITIVE Sensitive     RIFAMPIN <=0.5 SENSITIVE Sensitive     Inducible Clindamycin Value in next row Sensitive      NEGATIVEPerformed at [facility] [address] [place], [place] [address]    * FEW STAPHYLOCOCCUS AUREUS  Aerobic/Anaerobic Culture (surgical/deep wound)     Status: None (Preliminary result)   Collection Time: 02/23/20  2:29 PM   Specimen: PATH Other; Tissue  Result Value Ref Range Status   Specimen Description   Final    WOUND LEFT HIP  SUPERFICIAL FLUID Performed at [facility] [address], [place], [place] [address]    Special Requests   Final    NONE Performed at [facility], [place] [address] [place], [place] [address]    Gram Stain   Final    RARE WBC PRESENT, PREDOMINANTLY MONONUCLEAR NO ORGANISMS SEEN Performed at [facility], [place] [address], [place], [place] [address]    Culture   Final    FEW STAPHYLOCOCCUS AUREUS NO ANAEROBES ISOLATED; CULTURE IN PROGRESS FOR 5 DAYS    Report Status PENDING  Incomplete   Organism ID, Bacteria STAPHYLOCOCCUS AUREUS  Final      Susceptibility   Staphylococcus aureus - MIC*    CIPROFLOXACIN <=0.5 SENSITIVE Sensitive     ERYTHROMYCIN <=0.25 SENSITIVE Sensitive     GENTAMICIN <=0.5 SENSITIVE Sensitive     OXACILLIN 0.5 SENSITIVE Sensitive     TETRACYCLINE >=16 RESISTANT Resistant     VANCOMYCIN 1 SENSITIVE Sensitive     TRIMETH/SULFA <=10 SENSITIVE Sensitive     CLINDAMYCIN <=0.25 SENSITIVE Sensitive     RIFAMPIN <=0.5 SENSITIVE Sensitive     Inducible Clindamycin NEGATIVE Sensitive     * FEW STAPHYLOCOCCUS AUREUS  Culture, blood (routine x 2)     Status: None (Preliminary result)   Collection Time: 02/24/20  9:31 AM   Specimen: BLOOD LEFT HAND  Result Value Ref Range Status   Specimen Description   Final    BLOOD LEFT HAND Performed at [facility] [address], [place], [place] [address]    Special Requests   Final    BOTTLES DRAWN AEROBIC ONLY Blood Culture results may not be optimal due to an inadequate volume of blood received in culture bottles Performed at [facility] [address] [place], [place] [address]    Culture   Final    NO GROWTH 3 DAYS Performed at [facility], [place] [address] [place], [place] [address]    Report Status PENDING  Incomplete  Culture, blood (routine x 2)     Status: None (Preliminary result)   Collection Time: 02/24/20  9:31 AM    Specimen: BLOOD LEFT HAND  Result Value Ref Range Status   Specimen Description   Final    BLOOD LEFT HAND Performed at [facility] [address] [place], [place] [address]    Special Requests   Final    BOTTLES DRAWN AEROBIC ONLY Blood Culture adequate volume Performed at [facility] [address], [place], [place] [address]    Culture   Final    NO GROWTH 3 DAYS Performed at [facility], [place] [address], [place],  [address]    Report Status PENDING  Incomplete         Radiology Studies: US RENAL  Result Date: 02/25/2020 CLINICAL DATA:  Acute kidney injury, type II diabetes mellitus, hypertension EXAM: RENAL / URINARY TRACT ULTRASOUND COMPLETE COMPARISON:  CT abdomen and pelvis 09/14/2018 FINDINGS: Right Kidney: Renal measurements: 11.8 x 5.4 x 6.0 cm = volume: 2 L1 mL. Marked cortical thinning. Increased cortical echogenicity. Tiny cyst at inferior pole 13 x 11 x 9 mm. No additional mass, hydronephrosis, or shadowing calcification. Left Kidney: Obscured by medical brace, unable to visualize Bladder: Well distended, unremarkable. Other: N/A IMPRESSION: Nonvisualization of LEFT kidney due  to medical brace. Marked cortical thinning and medical renal disease changes of the RIGHT kidney. Tiny RIGHT renal cyst. No evidence of hydronephrosis or solid mass. Electronically Signed   By: Lavonia Dana M.D.   On: 02/25/2020 19:18   [place] EKG SITE RITE  Result Date: 02/27/2020 If Site Rite image not attached, placement could not be confirmed due to current cardiac rhythm.       Scheduled Meds: . allopurinol  300 mg Oral BID  . aspirin  81 mg Oral BID  . atorvastatin  80 mg Oral QPM  . carvedilol  12.5 mg Oral BID WC  . Chlorhexidine Gluconate Cloth  6 each Topical Q0600  . docusate sodium  100 mg Oral BID  . insulin aspart  0-5 Units Subcutaneous QHS  . insulin aspart  0-9 Units Subcutaneous TID WC  . mupirocin ointment  1  application Nasal BID  . nutrition supplement (JUVEN)  1 packet Oral BID BM  . Ensure Max Protein  11 oz Oral Daily  . senna  1 tablet Oral BID   Continuous Infusions: . sodium chloride    .  ceFAZolin (ANCEF) IV 2 g (02/27/20 5397)  . methocarbamol (ROBAXIN) IV Stopped (02/23/20 1656)     LOS: 4 days     Georgette Shell, MD 02/27/2020, 12:28 PM

## 2020-02-27 NOTE — Progress Notes (Addendum)
PHARMACY CONSULT NOTE FOR:  OUTPATIENT  PARENTERAL ANTIBIOTIC THERAPY (OPAT)  Indication: MSSA left hip PJI Regimen: Cefazolin 2g IV q8h + rifampin 300mg  PO BID End date: 04/04/2020  IV antibiotic discharge orders are pended. To discharging provider:  please sign these orders via discharge navigator,  Select New Orders & click on the button choice - Manage This Unsigned Work.     Thank you for allowing pharmacy to be a part of this patient's care.  Esmeralda Links, PharmD Candidate 02/27/2020, 2:45 PM
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
## 2020-02-28 ENCOUNTER — Other Ambulatory Visit (HOSPITAL_COMMUNITY): Payer: Self-pay | Admitting: Student

## 2020-02-28 DIAGNOSIS — T8459XA Infection and inflammatory reaction due to other internal joint prosthesis, initial encounter: Secondary | ICD-10-CM | POA: Diagnosis not present

## 2020-02-28 DIAGNOSIS — Z09 Encounter for follow-up examination after completed treatment for conditions other than malignant neoplasm: Secondary | ICD-10-CM | POA: Diagnosis not present

## 2020-02-28 DIAGNOSIS — Z96649 Presence of unspecified artificial hip joint: Secondary | ICD-10-CM | POA: Diagnosis not present

## 2020-02-28 LAB — AEROBIC/ANAEROBIC CULTURE W GRAM STAIN (SURGICAL/DEEP WOUND)

## 2020-02-28 LAB — BASIC METABOLIC PANEL
Anion gap: 7 (ref 5–15)
BUN: 31 mg/dL — ABNORMAL HIGH (ref 6–20)
CO2: 28 mmol/L (ref 22–32)
Calcium: 8.2 mg/dL — ABNORMAL LOW (ref 8.9–10.3)
Chloride: 103 mmol/L (ref 98–111)
Creatinine, Ser: 1.27 mg/dL — ABNORMAL HIGH (ref 0.61–1.24)
GFR, Estimated: >60 mL/min (ref >60)
Glucose, Bld: 124 mg/dL — ABNORMAL HIGH (ref 70–99)
Potassium: 3.8 mmol/L (ref 3.5–5.1)
Sodium: 138 mmol/L (ref 135–145)

## 2020-02-28 LAB — GLUCOSE, CAPILLARY
Glucose-Capillary: 120 mg/dL — ABNORMAL HIGH (ref 70–99)
Glucose-Capillary: 109 mg/dL — ABNORMAL HIGH (ref 70–99)
Glucose-Capillary: 98 mg/dL (ref 70–99)

## 2020-02-28 MED ORDER — HEPARIN SOD (PORK) LOCK FLUSH 100 UNIT/ML IV SOLN
250.0000 [IU] | INTRAVENOUS | Status: AC | PRN
  Administered 2020-02-28: 250 [IU]
  Filled 2020-02-28: qty 2.5

## 2020-02-28 MED ORDER — FERROUS SULFATE 325 (65 FE) MG PO TBEC: 325.0000 mg | DELAYED_RELEASE_TABLET | Freq: Two times a day (BID) | ORAL | 0 refills | Status: DC

## 2020-02-28 MED ORDER — OXYCODONE HCL 5 MG PO TABS
5.0000 mg | ORAL_TABLET | ORAL | 0 refills | Status: DC | PRN
Start: 2020-02-28 — End: 2020-03-05

## 2020-02-28 MED ORDER — ASPIRIN 81 MG PO CHEW: 81.0000 mg | CHEWABLE_TABLET | Freq: Two times a day (BID) | ORAL | 0 refills | Status: AC

## 2020-02-28 MED ORDER — RIFAMPIN 300 MG PO CAPS: 300.0000 mg | ORAL_CAPSULE | Freq: Two times a day (BID) | ORAL | 0 refills | Status: AC

## 2020-02-28 MED ORDER — SACCHAROMYCES BOULARDII 250 MG PO CAPS
250.0000 mg | ORAL_CAPSULE | Freq: Two times a day (BID) | ORAL | 2 refills | Status: DC
Start: 2020-02-28 — End: 2021-02-11

## 2020-02-28 MED ORDER — FUROSEMIDE 40 MG PO TABS
20.0000 mg | ORAL_TABLET | Freq: Two times a day (BID) | ORAL | 2 refills | Status: DC
Start: 2020-02-28 — End: 2021-02-09

## 2020-02-28 MED ORDER — SACCHAROMYCES BOULARDII 250 MG PO CAPS
250.0000 mg | ORAL_CAPSULE | Freq: Two times a day (BID) | ORAL | Status: DC
  Administered 2020-02-28: 250 mg via ORAL
  Filled 2020-02-28: qty 1

## 2020-02-28 MED ORDER — CEFAZOLIN IV (FOR PTA / DISCHARGE USE ONLY): 2.0000 g | Freq: Three times a day (TID) | INTRAVENOUS | 0 refills | Status: AC

## 2020-02-28 MED FILL — oxyCODONE HCL 5 MG TABS: 5 | 7 days supply | Qty: 28 | Fill #0

## 2020-02-28 MED FILL — ASPIRIN LOW DOSE 81 MG CHEW: 81 | 21 days supply | Qty: 42 | Fill #0

## 2020-02-28 MED FILL — rifAMPin 300 MG CAPS: 300 | 30 days supply | Qty: 60 | Fill #0

## 2020-02-28 NOTE — Progress Notes (Signed)
Physical Therapy Treatment Patient Details Name: Justin Hall MRN: [id number] DOB: [birth year] Today's Date: 02/28/2020    History of Present Illness Patient is 54 y.o. male admitted with L THR peri-prothetic infection and now s/p I&D.  Pt s/p Lt THA via posterolateral approach on 01/23/20 with PMH significant for OA, HTN, DM, CHF, depression, anxiety.    PT Comments    POD # 5 Assisted OOB to amb in hallway and practice stairs.  Pt issued one crutch to take home.  Addressed all mobility questions, discussed appropriate activity, educated on use of ICE.  Pt ready for D/C to home.   Follow Up Recommendations  Follow surgeon's recommendation for DC plan and follow-up therapies;Other (comment)     Equipment Recommendations  None recommended by PT    Recommendations for Other Services       Precautions / Restrictions Precautions Precautions: Fall;Posterior Hip Precaution Comments: pt recalled 3/3 THP so reducated pt and spouse Restrictions Other Position/Activity Restrictions: WBAT    Mobility  Bed Mobility Overal bed mobility: Needs Assistance Bed Mobility: Supine to Sit;Sit to Supine     Supine to sit: Min guard Sit to supine: Min guard   General bed mobility comments: demonstarted using belt strap to self assist LE off and back onto bed  Transfers Overall transfer level: Needs assistance Equipment used: Rolling walker (2 wheeled) Transfers: Sit to/from [facility] Sit to Stand: Supervision Stand pivot transfers: Supervision       General transfer comment: cues for LE management and use of UEs to self assist  Ambulation/Gait Ambulation/Gait assistance: Supervision Gait Distance (Feet): 185 Feet Assistive device: Rolling walker (2 wheeled) Gait Pattern/deviations: Step-to pattern;Decreased stride length Gait velocity: decreased   General Gait Details: Increased time with min cues for sequence, ER on L, posture and position from  RW   Stairs Stairs: Yes Stairs assistance: Min guard Stair Management: One rail Left;Step to pattern;With crutches;Forwards Number of Stairs: 2 General stair comments: using one LEFT rail and one RIGHT crutch with 25% VC's on proper sequencing and safety   Wheelchair Mobility    Modified Rankin (Stroke Patients Only)       Balance                                            Cognition Arousal/Alertness: Awake/alert Behavior During Therapy: WFL for tasks assessed/performed Overall Cognitive Status: Within Functional Limits for tasks assessed                                 General Comments: AxO x 3 very motivated      Exercises      General Comments        Pertinent Vitals/Pain Pain Assessment: 0-10 Pain Score: 6  Pain Location: LEFT hip/buttock Pain Descriptors / Indicators: Aching;Sore Pain Intervention(s): Monitored during session;Premedicated before session;Repositioned    Home Living                      Prior Function            PT Goals (current goals can now be found in the care plan section) Progress towards PT goals: Progressing toward goals    Frequency    7X/week      PT Plan Current plan remains appropriate    Co-evaluation  AM-PAC PT "6 Clicks" Mobility   Outcome Measure  Help needed turning from your back to your side while in a flat bed without using bedrails?: None Help needed moving from lying on your back to sitting on the side of a flat bed without using bedrails?: None Help needed moving to and from a bed to a chair (including a wheelchair)?: None Help needed standing up from a chair using your arms (e.g., wheelchair or bedside chair)?: None Help needed to walk in hospital room?: None Help needed climbing 3-5 steps with a railing? : A Little 6 Click Score: 23    End of Session Equipment Utilized During Treatment: Gait belt Activity Tolerance: Patient tolerated  treatment well Patient left: with call bell/phone within reach;in bed Nurse Communication: Mobility status PT Visit Diagnosis: Muscle weakness (generalized) (M62.81);Difficulty in walking, not elsewhere classified (R26.2)     Time: 0935-1006 PT Time Calculation (min) (ACUTE ONLY): 31 min  Charges:  $Gait Training: 8-22 mins $Therapeutic Activity: 8-22 mins                     Rica Koyanagi  PTA Acute  Rehabilitation Services Pager      [phone] Office      [phone]
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
## 2020-02-28 NOTE — Plan of Care (Signed)
Plan of care reviewed and discussed with the patient. 

## 2020-02-28 NOTE — Discharge Instructions (Signed)
POSTOPERATIVE DIRECTIONS  HOME CARE INSTRUCTIONS   Remove items at home which could result in a fall. This includes throw rugs or furniture in walking pathways.   ICE to the affected hip every three hours for 30 minutes at a time and then as needed for pain and swelling.  Continue to use ice on the hip for pain and swelling from surgery. You may notice swelling that will progress down to the foot and ankle.  This is normal after surgery.  Elevate the leg when you are not up walking on it.    Continue to use the breathing machine which will help keep your temperature down.  It is common for your temperature to cycle up and down following surgery, especially at night when you are not up moving around and exerting yourself.  The breathing machine keeps your lungs expanded and your temperature down.  DIET You may resume your previous home diet once your are discharged from the hospital.  DRESSING / WOUND CARE / SHOWERING Wound vac will be pulled at your first postoperative visit.  ACTIVITY Walk with your walker as instructed. Use walker as long as suggested by your caregivers. Avoid periods of inactivity such as sitting longer than an hour when not asleep. This helps prevent blood clots.  You may resume a sexual relationship in one month or when given the OK by your doctor.  You may return to work once you are cleared by your doctor.  Do not drive a car for 6 weeks or until released by you surgeon.  Do not drive while taking narcotics.  WEIGHT BEARING Weight bearing as tolerated with assist device (walker, cane, etc) as directed, use it as long as suggested by your surgeon or therapist, typically at least 4-6 weeks.  POSTOPERATIVE CONSTIPATION PROTOCOL Constipation - defined medically as fewer than three stools per week and severe constipation as less than one stool per week.  One of the most common issues patients have following surgery is constipation.  Even if you have a regular  bowel pattern at home, your normal regimen is likely to be disrupted due to multiple reasons following surgery.  Combination of anesthesia, postoperative narcotics, change in appetite and fluid intake all can affect your bowels.  In order to avoid complications following surgery, here are some recommendations in order to help you during your recovery period.  Colace (docusate) - Pick up an over-the-counter form of Colace or another stool softener and take twice a day as long as you are requiring postoperative pain medications.  Take with a full glass of water daily.  If you experience loose stools or diarrhea, hold the colace until you stool forms back up.  If your symptoms do not get better within 1 week or if they get worse, check with your doctor.  Dulcolax (bisacodyl) - Pick up over-the-counter and take as directed by the product packaging as needed to assist with the movement of your bowels.  Take with a full glass of water.  Use this product as needed if not relieved by Colace only.   MiraLax (polyethylene glycol) - Pick up over-the-counter to have on hand.  MiraLax is a solution that will increase the amount of water in your bowels to assist with bowel movements.  Take as directed and can mix with a glass of water, juice, soda, coffee, or tea.  Take if you go more than two days without a movement. Do not use MiraLax more than once per day. Call your  doctor if you are still constipated or irregular after using this medication for 7 days in a row.  If you continue to have problems with postoperative constipation, please contact the office for further assistance and recommendations.  If you experience "the worst abdominal pain ever" or develop nausea or vomiting, please contact the office immediatly for further recommendations for treatment.  ITCHING  If you experience itching with your medications, try taking only a single pain pill, or even half a pain pill at a time.  You can also use Benadryl  over the counter for itching or also to help with sleep.   TED HOSE STOCKINGS Wear the elastic stockings on both legs for three weeks following surgery during the day but you may remove then at night for sleeping.  MEDICATIONS See your medication summary on the After Visit Summary that the nursing staff will review with you prior to discharge.  You may have some home medications which will be placed on hold until you complete the course of blood thinner medication.  It is important for you to complete the blood thinner medication as prescribed by your surgeon.  Continue your approved medications as instructed at time of discharge.  PRECAUTIONS If you experience chest pain or shortness of breath - call 911 immediately for transfer to the hospital emergency department.  If you develop a fever greater that 101 F, purulent drainage from wound, increased redness or drainage from wound, foul odor from the wound/dressing, or calf pain - CONTACT YOUR SURGEON.                                                   FOLLOW-UP APPOINTMENTS Make sure you keep all of your appointments after your operation with your surgeon and caregivers. You should call the office at the above phone number and make an appointment for approximately two weeks after the date of your surgery or on the date instructed by your surgeon outlined in the "After Visit Summary".  MAKE SURE YOU:   Understand these instructions.   Get help right away if you are not doing well or get worse.    Pick up stool softner and laxative for home use following surgery while on pain medications. Do not submerge incision under water. Please use good hand washing techniques while changing dressing each day. May shower starting three days after surgery. Please use a clean towel to pat the incision dry following showers. Continue to use ice for pain and swelling after surgery. Do not use any lotions or creams on the incision until instructed by your  surgeon.

## 2020-02-28 NOTE — Progress Notes (Signed)
"  leaking alarm" started after PA removed JP drain at posterior end of the patient's surgical wound vac.   Tape reinforcement unsuccessful at stopping leak. Wound vac supplies ordered to reinforce dorsal end of wound vac. Small bridge made at end of dressing to cover JP drain exit, and dorsal end of the dressing to seal wound vac.   This correction fixed the leaking warning from the wound vac, and suction returned to 129mmHg.   Will continue to monitor.     [place], Therapist, sports

## 2020-02-28 NOTE — Progress Notes (Addendum)
PROGRESS NOTE    Justin Hall  EXH:[id number] DOB: [birth year] DOA: 02/23/2020 PCP: Justin Pal, DO   Brief Narrative:Justin Hall is a 54 y.o. male with medical history significant of HFpEF, DM2, HTN. Presenting for I&D of left hip. He had a left total hip performed on 01/23/20. During his 1st post-operative follow up a couple weeks later, he was noted to have a hematoma. At his subsequent post-op follow up, he was noted to have purulent drainage from an incision site. Initially, an IR-guided drainage attempt was made. However, it was unsuccessful. He was then taken to the OR for surgical exploration. He successfully completed that surgery and had about 1000cc of blood/puss removed. He was started on ancef and vacn w/ pharm dosing. Over the last couple of days, his Scr has risen. TRH was consulted for this issue.  Assessment & Plan:   Active Problems:   AKI (acute kidney injury) ([place])   Infection of prosthetic total hip joint ([place])   Postop check   #1 infected left prosthetic hip status post left hip washout.  Per Ortho.  On Ancef for 6 weeks. I have given prescription for Florastor twice a day.  #2 AKI on CKD stage IIIa-holding Lasix Metformin and lisinopril.  Started on Ancef since wound culture is growing staph aureus no evidence of MRSA. Renal function improving 1.68 from 2.14  He was on lasix 40 bid at home  I have decrease Lasix to 20 mg twice a day on discharge.  Discussed with patient. Renal ultrasound shows no evidence of hydronephrosis.  Left kidney was not visualized well the right kidney shows medical renal disease.  #3 type 2 diabetes he is on Metformin at home which I am going to hold due to elevated creatinine. CBG (last 3)  Recent Labs    02/27/20 1648 02/27/20 2100 02/28/20 0722  GLUCAP 101* 152* 120*     #4 history of essential hypertension blood pressure 155/93.  Increase Coreg to 12.5 twice a day.  #5 chronic diastolic heart failure patient  euvolemic-restart lasix at 20 mg qd  #6 acute blood loss anemia status post 2 units of packed RBCs.  Hemoglobin 8.5 from 6.5.  #7 hyperkalemia resolved.  Nutrition Problem: Increased nutrient needs Etiology: wound healing     Signs/Symptoms: estimated needs    Interventions: [place], Refer to RD note for recommendations  Estimated body mass index is 43.74 kg/m as calculated from the following:   Height as of this encounter: 5' 9.5" (1.765 m).   Weight as of this encounter: 136.3 kg.  Subjective: Patient sitting up by the side of the bed, felt a little lightheaded now feeling better seen by ID started on cefazolin for a total of 6 weeks and rifampin 300 mg twice a day followed by oral cephalosporin for 3 to 6 months.  Planning for discharge today.  Patient has no new concerns.  I have told him to continue Lasix at 20 mg twice a day instead of 40 mg twice a day.   Objective: Vitals:   02/27/20 1831 02/27/20 2101 02/27/20 2202 02/28/20 0634  BP: (!) 176/92 (!) 143/76  (!) 171/103  Pulse: 88 90  84  Resp: 18 19 20 18   Temp:  98.3 F (36.8 C)  98.1 F (36.7 C)  TempSrc:  Oral  Oral  SpO2: 96% 96%  98%  Weight:      Height:        Intake/Output Summary (Last 24 hours) at 02/28/2020 1058 Last data  filed at 02/28/2020 0730 Gross per 24 hour  Intake 1420.6 ml  Output 1593 ml  Net -172.4 ml   Filed Weights   02/23/20 1025  Weight: (!) 136.3 kg    Examination:  General exam: Appears calm and comfortable  Respiratory system: Clear to auscultation. Respiratory effort normal. Cardiovascular system: S1 & S2 heard, RRR. No JVD, murmurs, rubs, gallops or clicks. No pedal edema. Gastrointestinal system: Abdomen is nondistended, soft and nontender. No organomegaly or masses felt. Normal bowel sounds heard. Central nervous system: Alert and oriented. No focal neurological deficits. Extremities: Left hip drains in place.trace edema b/l Skin: No rashes, lesions or  ulcers Psychiatry: Judgement and insight appear normal. Mood & affect appropriate.     Data Reviewed: I have personally reviewed following labs and imaging studies  CBC: Recent Labs  Lab 02/23/20 1045 02/23/20 1425 02/23/20 1708 02/24/20 0317 02/25/20 0404 02/26/20 0318 02/27/20 0314  WBC 9.5  --   --  13.0* 11.2* 12.3* 9.9  HGB 9.5*   < > 8.2* 7.1* 6.5* 8.5* 8.3*  HCT 29.9*   < > 26.3* 22.8* 20.4* 27.0* 26.5*  MCV 81.7  --   --  80.9 82.6 83.9 84.4  PLT 483*  --   --  477* 385 414* 375   < > = values in this interval not displayed.   Basic Metabolic Panel: Recent Labs  Lab 02/24/20 0317 02/25/20 1637 02/26/20 0318 02/27/20 0314 02/28/20 0350  NA 134* 135  136 137 138 138  K 5.3* 3.9  3.9 4.3 4.1 3.8  CL 99 98  98 100 104 103  CO2 24 23  28 25 27 28   GLUCOSE 163* 131*  129* 93 121* 124*  BUN 34* 41*  41* 41* 35* 31*  CREATININE 2.34* 2.34*  2.42* 2.14* 1.68* 1.27*  CALCIUM 8.2* 8.0*  8.1* 8.0* 8.3* 8.2*  PHOS  --  4.0 3.8  --   --    GFR: Estimated Creatinine Clearance: 89.8 mL/min (A) (by C-G formula based on SCr of 1.27 mg/dL (H)). Liver Function Tests: Recent Labs  Lab 02/23/20 1045 02/25/20 1637 02/26/20 0318 02/27/20 0314  AST 23  --   --  12*  ALT 17  --   --  6  ALKPHOS 88  --   --  68  BILITOT 0.6  --   --  0.5  PROT 7.9  --   --  6.3*  ALBUMIN 3.0* 2.8* 2.8* 2.8*   No results for input(s): LIPASE, AMYLASE in the last 168 hours. No results for input(s): AMMONIA in the last 168 hours. Coagulation Profile: Recent Labs  Lab 02/23/20 1045  INR 1.1   Cardiac Enzymes: No results for input(s): CKTOTAL, CKMB, CKMBINDEX, TROPONINI in the last 168 hours. BNP (last 3 results) No results for input(s): PROBNP in the last 8760 hours. HbA1C: No results for input(s): HGBA1C in the last 72 hours. CBG: Recent Labs  Lab 02/27/20 0720 02/27/20 1201 02/27/20 1648 02/27/20 2100 02/28/20 0722  GLUCAP 117* 105* 101* 152* 120*   Lipid  Profile: No results for input(s): CHOL, HDL, LDLCALC, TRIG, CHOLHDL, LDLDIRECT in the last 72 hours. Thyroid Function Tests: No results for input(s): TSH, T4TOTAL, FREET4, T3FREE, THYROIDAB in the last 72 hours. Anemia Panel: No results for input(s): VITAMINB12, FOLATE, FERRITIN, TIBC, IRON, RETICCTPCT in the last 72 hours. Sepsis Labs: No results for input(s): PROCALCITON, LATICACIDVEN in the last 168 hours.  Recent Results (from the past 240 hour(s))  SARS  CORONAVIRUS 2 (TAT 6-24 HRS) Nasopharyngeal Nasopharyngeal Swab     Status: None   Collection Time: 02/22/20  2:34 PM   Specimen: Nasopharyngeal Swab  Result Value Ref Range Status   SARS Coronavirus 2 NEGATIVE NEGATIVE Final    Comment: (NOTE) SARS-CoV-2 target nucleic acids are NOT DETECTED.  The SARS-CoV-2 RNA is generally detectable in upper and lower respiratory specimens during the acute phase of infection. Negative results do not preclude SARS-CoV-2 infection, do not rule out co-infections with other pathogens, and should not be used as the sole basis for treatment or other patient management decisions. Negative results must be combined with clinical observations, patient history, and epidemiological information. The expected result is Negative.  Fact Sheet for Patients: SugarRoll.be  Fact Sheet for Healthcare Providers: https://www.woods-Zyliah Schier.com/  This test is not yet approved or cleared by the [place] FDA and  has been authorized for detection and/or diagnosis of SARS-CoV-2 by FDA under an Emergency Use Authorization (EUA). This EUA will remain  in effect (meaning this test can be used) for the duration of the COVID-19 declaration under Se ction 564(b)(1) of the Act, 21 U.S.C. section 360bbb-3(b)(1), unless the authorization is terminated or revoked sooner.  Performed at [facility], [place] [address] [place], [place] [address]   Surgical pcr screen      Status: Abnormal   Collection Time: 02/23/20 10:35 AM   Specimen: Nasal Mucosa; Nasal Swab  Result Value Ref Range Status   MRSA, PCR POSITIVE (A) NEGATIVE Final    Comment: RESULT CALLED TO, READ BACK BY AND VERIFIED WITH: GIGLIOTTI,K. @1447  ON 11.04.2021 BY COHEN,K    Staphylococcus aureus POSITIVE (A) NEGATIVE Final    Comment: (NOTE) The Xpert SA Assay (FDA approved for NASAL specimens in patients 77 years of age and older), is one component of a comprehensive surveillance program. It is not intended to diagnose infection nor to guide or monitor treatment. Performed at [facility], [place] [address], [place], [place] [address]   Aerobic/Anaerobic Culture (surgical/deep wound)     Status: None (Preliminary result)   Collection Time: 02/23/20  2:21 PM   Specimen: PATH Soft tissue  Result Value Ref Range Status   Specimen Description TISSUE LEFT FEMORAL INTERFACE MEMBRANE  Final   Special Requests NONE  Final   Gram Stain   Final    RARE WBC PRESENT, PREDOMINANTLY MONONUCLEAR NO ORGANISMS SEEN    Culture   Final    FEW STAPHYLOCOCCUS AUREUS NO ANAEROBES ISOLATED; CULTURE IN PROGRESS FOR 5 DAYS    Report Status PENDING  Incomplete   Organism ID, Bacteria STAPHYLOCOCCUS AUREUS  Final      Susceptibility   Staphylococcus aureus - MIC*    CIPROFLOXACIN <=0.5 SENSITIVE Sensitive     ERYTHROMYCIN <=0.25 SENSITIVE Sensitive     GENTAMICIN <=0.5 SENSITIVE Sensitive     OXACILLIN 0.5 SENSITIVE Sensitive     TETRACYCLINE >=16 RESISTANT Resistant     VANCOMYCIN 1 SENSITIVE Sensitive     TRIMETH/SULFA <=10 SENSITIVE Sensitive     CLINDAMYCIN <=0.25 SENSITIVE Sensitive     RIFAMPIN <=0.5 SENSITIVE Sensitive     Inducible Clindamycin Value in next row Sensitive      NEGATIVEPerformed at [facility] [address] [place], [place] [address]    * FEW STAPHYLOCOCCUS AUREUS  Aerobic/Anaerobic Culture (surgical/deep wound)     Status: None (Preliminary result)    Collection Time: 02/23/20  2:29 PM   Specimen: PATH Other; Tissue  Result Value Ref Range Status   Specimen Description   Final    WOUND LEFT HIP SUPERFICIAL FLUID Performed at [facility] [address] [place], [place] [address]    Special Requests   Final    NONE Performed at [facility], [place] [address] [place], [place] [address]    Gram Stain   Final    RARE WBC PRESENT, PREDOMINANTLY MONONUCLEAR NO ORGANISMS SEEN Performed at [facility], [place] [address] [place], [place] [address]    Culture   Final    FEW STAPHYLOCOCCUS AUREUS NO ANAEROBES ISOLATED; CULTURE IN PROGRESS FOR 5 DAYS    Report Status PENDING  Incomplete   Organism ID, Bacteria STAPHYLOCOCCUS AUREUS  Final      Susceptibility   Staphylococcus aureus - MIC*    CIPROFLOXACIN <=0.5 SENSITIVE Sensitive     ERYTHROMYCIN <=0.25 SENSITIVE Sensitive     GENTAMICIN <=0.5 SENSITIVE Sensitive     OXACILLIN 0.5 SENSITIVE Sensitive     TETRACYCLINE >=16 RESISTANT Resistant     VANCOMYCIN 1 SENSITIVE Sensitive     TRIMETH/SULFA <=10 SENSITIVE Sensitive     CLINDAMYCIN <=0.25 SENSITIVE Sensitive     RIFAMPIN <=0.5 SENSITIVE Sensitive     Inducible Clindamycin NEGATIVE Sensitive     * FEW STAPHYLOCOCCUS AUREUS  Culture, blood (routine x 2)     Status: None (Preliminary result)   Collection Time: 02/24/20  9:31 AM   Specimen: BLOOD LEFT HAND  Result Value Ref Range Status   Specimen Description   Final    BLOOD LEFT HAND Performed at [facility] [address], [place], [place] [address]    Special Requests   Final    BOTTLES DRAWN AEROBIC ONLY Blood Culture results may not be optimal due to an inadequate volume of blood received in culture bottles Performed at [facility] [address] [place], [place] [address]    Culture   Final    NO GROWTH 4 DAYS Performed at [facility], [place] [address], [place], [place]  [address]    Report Status PENDING  Incomplete  Culture, blood (routine x 2)     Status: None (Preliminary result)   Collection Time: 02/24/20  9:31 AM   Specimen: BLOOD LEFT HAND  Result Value Ref Range Status   Specimen Description   Final    BLOOD LEFT HAND Performed at [facility] [address], [place], [place] [address]    Special Requests   Final    BOTTLES DRAWN AEROBIC ONLY Blood Culture adequate volume Performed at [facility] [address], [place], [place] [address]    Culture   Final    NO GROWTH 4 DAYS Performed at [facility], [place] [address] [place], [place] [address]    Report Status PENDING  Incomplete         Radiology Studies: [place] EKG SITE RITE  Result Date: 02/27/2020 If Site Rite image not attached, placement could not be confirmed due to current cardiac rhythm.       Scheduled Meds: . allopurinol  300 mg Oral BID  . aspirin  81 mg Oral BID  . atorvastatin  80 mg Oral QPM  . carvedilol  12.5 mg Oral BID WC  . Chlorhexidine Gluconate Cloth  6 each Topical Q0600  . docusate sodium  100 mg Oral BID  . furosemide  20 mg Intravenous Daily  .  insulin aspart  0-5 Units Subcutaneous QHS  . insulin aspart  0-9 Units Subcutaneous TID WC  . nutrition supplement (JUVEN)  1 packet Oral BID BM  . Ensure Max Protein  11 oz Oral Daily  . rifampin  300 mg Oral BID WC  . saccharomyces boulardii  250 mg Oral BID  . senna  1 tablet Oral BID  . sodium chloride flush  10-40 mL Intracatheter Q12H   Continuous Infusions: . sodium chloride    .  ceFAZolin (ANCEF) IV 2 g (02/28/20 0630)  . methocarbamol (ROBAXIN) IV Stopped (02/23/20 1656)     LOS: 5 days     Georgette Shell, MD 02/28/2020, 10:58 AM

## 2020-02-28 NOTE — TOC Transition Note (Signed)
Transition of Care [facility]) - CM/SW Discharge Note   Patient Details  Name: Joby Richart MRN: [id number] Date of Birth: [birth year]  [facility] of [facility]) CM/SW Contact:  Lennart Pall, LCSW Phone Number: 02/28/2020, 11:29 AM   Clinical Narrative:    Met with pt and wife to review dc arrangement.  Carolynn Sayers with [employer] has completed education for home IV abx and supplies ordered.  [place] to provide RN visits to oversee.  Pt has needed DME.  Ready for dc today. No further TOC needs.   Final next level of care: [place] Barriers to Discharge: Barriers Resolved   Patient Goals and CMS Choice Patient states their goals for this hospitalization and ongoing recovery are:: return home      Discharge Placement                       Discharge Plan and Services In-house Referral: Clinical Social Work              DME Arranged: N/A DME Agency: NA       HH Arranged: RN, IV Antibiotics HH Agency: Other - See comment, Forensic scientist Nursing) Date [place]: 02/27/20   Representative spoke with at [place]: [place] ([place]) Interventions     Readmission Risk Interventions Readmission Risk Prevention Plan 02/24/2020  Transportation Screening Complete  PCP or Specialist Appt within 5-7 Days Complete  Home Care Screening Complete  Medication Review (RN CM) Complete  Some recent data might be hidden

## 2020-02-29 ENCOUNTER — Telehealth: Payer: Self-pay | Admitting: *Deleted

## 2020-02-29 ENCOUNTER — Other Ambulatory Visit: Payer: Self-pay | Admitting: Family Medicine

## 2020-02-29 LAB — CULTURE, BLOOD (ROUTINE X 2)
Culture: NO GROWTH
Culture: NO GROWTH
Special Requests: ADEQUATE

## 2020-02-29 NOTE — Progress Notes (Signed)
Patient discharged home per orders. Patient discharged with PICC line in place for home IV ABX and discharged with portal wound vac. Patient and wife were provided with DC instructions and medications were brung to the floor by the pharmacist. Patient D/C from unit by [facility] accompanied by RN.

## 2020-02-29 NOTE — Telephone Encounter (Signed)
Patient called, stated both of his hands are numb from the wrist to his hands.  This started yesterday/last night. Has not gotten worse, has not spread anywhere else. There is a little swelling in his hands, but no where else  There is no discoloration. No temperature change. No pain. Per Marya Amsler, not likely to be a reaction to the PICC or the antibiotics. Patient has also sent message to PCP and to surgeon.  He takes medication for blood pressure and lasix.   RN advised him to go to ER if the symptoms get worse, if there is pain, or if any color change. Patient verbalized understanding. [name], RN

## 2020-02-29 NOTE — Telephone Encounter (Signed)
Last OV--11/23/2019 Last RF---12/27/2019  #30 with 1 refill

## 2020-03-01 MED FILL — FUROSEMIDE 40 MG TAB: 40 | 30 days supply | Qty: 30 | Fill #0

## 2020-03-02 NOTE — Telephone Encounter (Signed)
Agree with plan as outlined.

## 2020-03-04 ENCOUNTER — Other Ambulatory Visit: Payer: Self-pay | Admitting: Family Medicine

## 2020-03-05 ENCOUNTER — Encounter: Payer: Self-pay | Admitting: Family Medicine

## 2020-03-05 ENCOUNTER — Other Ambulatory Visit: Payer: Self-pay

## 2020-03-05 ENCOUNTER — Ambulatory Visit (INDEPENDENT_AMBULATORY_CARE_PROVIDER_SITE_OTHER): Payer: No Typology Code available for payment source | Admitting: Family Medicine

## 2020-03-05 VITALS — BP 162/98 | HR 84 | Temp 98.1°F | Resp 16 | Ht 69.5 in | Wt 300.2 lb

## 2020-03-05 DIAGNOSIS — M109 Gout, unspecified: Secondary | ICD-10-CM

## 2020-03-05 DIAGNOSIS — R030 Elevated blood-pressure reading, without diagnosis of hypertension: Secondary | ICD-10-CM

## 2020-03-05 DIAGNOSIS — T8459XA Infection and inflammatory reaction due to other internal joint prosthesis, initial encounter: Secondary | ICD-10-CM | POA: Diagnosis not present

## 2020-03-05 DIAGNOSIS — Z96649 Presence of unspecified artificial hip joint: Secondary | ICD-10-CM

## 2020-03-05 MED ORDER — OXYCODONE HCL 10 MG PO TABS: 5.0000 mg | ORAL_TABLET | Freq: Three times a day (TID) | ORAL | 0 refills | Status: DC | PRN

## 2020-03-05 NOTE — Patient Instructions (Addendum)
Mind your blood pressure at home. If >140/90, I want to see you in the next 3-4 weeks.   Do not drink alcohol, do any illicit/street drugs, drive or do anything that requires alertness while on this medicine.   Send me a message in a week about the water pill.   Let us know if you need anything.
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
## 2020-03-19 ENCOUNTER — Other Ambulatory Visit: Payer: Self-pay

## 2020-03-19 DIAGNOSIS — T8459XA Infection and inflammatory reaction due to other internal joint prosthesis, initial encounter: Secondary | ICD-10-CM

## 2020-03-19 DIAGNOSIS — Z96649 Presence of unspecified artificial hip joint: Secondary | ICD-10-CM

## 2020-03-19 MED ORDER — ATORVASTATIN CALCIUM 80 MG PO TABS
80.0000 mg | ORAL_TABLET | Freq: Every day | ORAL | 2 refills | Status: DC
Start: 2020-03-19 — End: 2021-04-08

## 2020-03-19 MED ORDER — METFORMIN HCL 500 MG PO TABS
500.0000 mg | ORAL_TABLET | Freq: Two times a day (BID) | ORAL | 3 refills | Status: DC
Start: 2020-03-19 — End: 2021-02-11

## 2020-03-19 NOTE — Telephone Encounter (Signed)
Last OV---03/05/2020 Last refill---03/05/2020--#45 no refills [facility]  Done on 08/12/2017, no UDS

## 2020-03-20 ENCOUNTER — Telehealth: Payer: Self-pay | Admitting: Family Medicine

## 2020-03-20 MED ORDER — OXYCODONE HCL 10 MG PO TABS: 5.0000 mg | ORAL_TABLET | Freq: Three times a day (TID) | ORAL | 0 refills | Status: DC | PRN

## 2020-03-20 NOTE — Telephone Encounter (Signed)
Received call from [place] at [facility] to report critical lab value of potassium of 6.0 on 03/14/20. Previous K of 3.9 on 03/05/20.   Called patient to see how he's feeling. Patient states he has had fatigue, nausea, loss of appetite and diarrhea for the last week. States that he started vomiting yesterday, but has had no episodes today. Patient describes the diarrhea as occurring twice a day and being completely watery. He has not taken anything over the counter for his symptoms. He denies fevers and any recent COVID exposures. Patient states his home health nurse drew labs yesterday, Jeani Hawking will fax results once available.   Jeani Hawking states that they did not receive the potassium results until today. Will route to provider.   Beryle Flock, RN

## 2020-03-20 NOTE — Telephone Encounter (Signed)
Called [facility] and they drew his blood last night to recheck that and should have results today. Just received results and are in folder. The patient stated they draw his blood weekly

## 2020-03-20 NOTE — Telephone Encounter (Signed)
Patient called to report that he thinks his high potassium level is due to him restarting his lasix and potassium supplement. He said his PCP took him off, but that he later developed swelling in his legs and feet so he started taking it again about two weeks ago.   Patient has reached out to PCP to determine course of treatment regarding lasix and potassium.   RN called Jeani Hawking at [facility] to verify if they knew patient had started taking potassium again. Jeani Hawking states patient told him about it today. RN gave Jeani Hawking patient's PCP information so that [facility] can coordinate and fax lab results to PCP as well.   Beryle Flock, RN

## 2020-03-20 NOTE — Telephone Encounter (Signed)
Probably lysis, but have him come in for labs at his convenience to ck BMP. Ty.

## 2020-03-20 NOTE — Telephone Encounter (Signed)
Justin Hall Pharmacist with [facility] Patient Potassium was 6 on 03/13/2020. Lab results will be faxed to our office  as well as todays Call back number [phone]

## 2020-03-22 ENCOUNTER — Encounter: Payer: Self-pay | Admitting: Internal Medicine

## 2020-03-22 ENCOUNTER — Telehealth: Payer: Self-pay

## 2020-03-22 ENCOUNTER — Other Ambulatory Visit: Payer: Self-pay

## 2020-03-22 ENCOUNTER — Ambulatory Visit (INDEPENDENT_AMBULATORY_CARE_PROVIDER_SITE_OTHER): Payer: No Typology Code available for payment source | Admitting: Internal Medicine

## 2020-03-22 VITALS — BP 167/113 | HR 101 | Temp 97.9°F | Wt 286.4 lb

## 2020-03-22 DIAGNOSIS — Z452 Encounter for adjustment and management of vascular access device: Secondary | ICD-10-CM | POA: Diagnosis not present

## 2020-03-22 DIAGNOSIS — T8459XD Infection and inflammatory reaction due to other internal joint prosthesis, subsequent encounter: Secondary | ICD-10-CM

## 2020-03-22 DIAGNOSIS — Z5181 Encounter for therapeutic drug level monitoring: Secondary | ICD-10-CM | POA: Insufficient documentation

## 2020-03-22 DIAGNOSIS — Z96649 Presence of unspecified artificial hip joint: Secondary | ICD-10-CM

## 2020-03-22 DIAGNOSIS — T8459XA Infection and inflammatory reaction due to other internal joint prosthesis, initial encounter: Secondary | ICD-10-CM | POA: Diagnosis not present

## 2020-03-22 MED ORDER — CEPHALEXIN 500 MG PO CAPS: 500.0000 mg | ORAL_CAPSULE | Freq: Four times a day (QID) | ORAL | 5 refills | Status: DC

## 2020-03-22 NOTE — Progress Notes (Signed)
   Subjective:    Patient ID: Justin Hall, male    DOB: 1966-02-06, 54 y.o.   MRN: [id number]  HPI Here for hsfu He had a left total hip arthroplasty done 4 weeks prior to presentation and developed a PJI with MSSA.  He underwent I and D and polyexchange by Dr. Lyla Glassing on 11/4.  He was placed on cefazolin for a projected 6 weeks through 12/15 along with rifampin orally and doing well.  Some issues with hand numbness/tingling on the side of the picc line.  ESR, CRP trending down.     Review of Systems  Constitutional: Negative for chills and fever.  Gastrointestinal: Negative for diarrhea and nausea.  Skin: Negative for rash.       Objective:   Physical Exam Constitutional:      Appearance: Normal appearance.  Eyes:     General: No scleral icterus. Pulmonary:     Effort: Pulmonary effort is normal.  Neurological:     General: No focal deficit present.     Mental Status: He is alert.  Psychiatric:        Mood and Affect: Mood normal.   SH: no tobacco        Assessment & Plan:

## 2020-03-22 NOTE — Assessment & Plan Note (Signed)
Creat, wbc has remained stable.  Will continue to monitor

## 2020-03-22 NOTE — Assessment & Plan Note (Signed)
Working well but seems that it is near a nerve.  Hopefully will resolve after picc removal

## 2020-03-22 NOTE — Assessment & Plan Note (Signed)
This is healing well, inflammatory markers responding/trending down as expected.  No concerns and will continue with plan to complete the 6 weeks of IV antibiotics with rifampin followed by 6 months of oral Keflex alone 4 times a day.  He will follow up with me next month.

## 2020-03-22 NOTE — Telephone Encounter (Signed)
Per MD called [facility] to pull picc after last dose. Spoke with Jackelyn Poling who will update nursing. Order was repeated and confirmed before ending call.

## 2020-03-28 ENCOUNTER — Ambulatory Visit (INDEPENDENT_AMBULATORY_CARE_PROVIDER_SITE_OTHER): Payer: No Typology Code available for payment source | Admitting: Family Medicine

## 2020-03-28 ENCOUNTER — Encounter: Payer: Self-pay | Admitting: Family Medicine

## 2020-03-28 ENCOUNTER — Other Ambulatory Visit: Payer: Self-pay

## 2020-03-28 VITALS — BP 148/98 | HR 120 | Temp 98.5°F | Ht 69.0 in | Wt 289.4 lb

## 2020-03-28 DIAGNOSIS — M109 Gout, unspecified: Secondary | ICD-10-CM | POA: Diagnosis not present

## 2020-03-28 MED ORDER — TRAMADOL HCL 50 MG PO TABS: 50.0000 mg | ORAL_TABLET | Freq: Two times a day (BID) | ORAL | 1 refills | Status: DC | PRN

## 2020-03-28 MED ORDER — METHYLPREDNISOLONE 4 MG PO TBPK: ORAL_TABLET | ORAL | 0 refills | Status: DC

## 2020-03-28 NOTE — Patient Instructions (Signed)
Ice/cold pack over area for 10-15 min twice daily.  OK to take Tylenol 1000 mg (2 extra strength tabs) or 975 mg (3 regular strength tabs) every 6 hours as needed.  No oxy while on the tramadol.   No NSAIDs like ibuprofen while on the Dosepak.   Let us know if you need anything.
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
## 2020-04-02 ENCOUNTER — Other Ambulatory Visit: Payer: Self-pay | Admitting: Family Medicine

## 2020-04-02 DIAGNOSIS — M1A9XX0 Chronic gout, unspecified, without tophus (tophi): Secondary | ICD-10-CM

## 2020-04-02 DIAGNOSIS — E1169 Type 2 diabetes mellitus with other specified complication: Secondary | ICD-10-CM

## 2020-04-02 MED ORDER — OXYCODONE HCL 10 MG PO TABS
5.0000 mg | ORAL_TABLET | Freq: Three times a day (TID) | ORAL | 0 refills | Status: DC | PRN
Start: 2020-04-02 — End: 2020-04-25

## 2020-04-06 ENCOUNTER — Other Ambulatory Visit: Payer: Self-pay | Admitting: Family Medicine

## 2020-04-06 DIAGNOSIS — G4733 Obstructive sleep apnea (adult) (pediatric): Secondary | ICD-10-CM

## 2020-04-25 ENCOUNTER — Other Ambulatory Visit: Payer: Self-pay

## 2020-04-25 MED ORDER — OXYCODONE HCL 10 MG PO TABS
5.0000 mg | ORAL_TABLET | Freq: Three times a day (TID) | ORAL | 0 refills | Status: DC | PRN
Start: 2020-04-25 — End: 2020-05-23

## 2020-04-25 NOTE — Telephone Encounter (Signed)
Requesting: oxycodone 10mg  Contract: None UDS: None Last Visit: 03/28/2020 Next Visit: None Last Refill: 04/02/2020 #75 and 0RF  Pt needing early he is going out of town.   Please Advise

## 2020-05-08 ENCOUNTER — Ambulatory Visit: Payer: No Typology Code available for payment source | Admitting: Internal Medicine

## 2020-05-09 ENCOUNTER — Telehealth: Payer: Self-pay

## 2020-05-09 ENCOUNTER — Telehealth: Payer: Self-pay | Admitting: Family Medicine

## 2020-05-09 NOTE — Telephone Encounter (Signed)
Called the patients number no answer/VM not set up yet. Called his wife's number left message to call back.

## 2020-05-09 NOTE — Telephone Encounter (Signed)
PA form completed and faxed to [facility] at [phone]. Will have to call [facility] PA call center at [phone] in 24 hours to check status of PA.

## 2020-05-09 NOTE — Telephone Encounter (Signed)
He went through 75 tabs in 2 weeks?

## 2020-05-09 NOTE — Telephone Encounter (Signed)
Called the patient and due to insurance changes (going on medicaid) he received only #35 and had to pay $65 for that. He went to the pharmacy today to show them his new medicaid card in hopes to get some of his money back.  He thinks for some reason they may have ran the prescription through again to check his card, he is not sure. He does not need a refill at this time maybe in a couple weeks. If he does though he will call our office for that refill not the pharmacy

## 2020-05-09 NOTE — Telephone Encounter (Signed)
Refill request for Oxycodone 10 mg tablet Last OV---03/28/20 Last RF---04/25/2020---  #75 no refills [facility]--08/12/2017

## 2020-05-10 NOTE — Telephone Encounter (Signed)
Called [facility]- spoke w/ Ebony Hail- PA approved. Effective 05/10/2020 to 11/06/2020. PA confirmation number: [id number].

## 2020-05-18 ENCOUNTER — Other Ambulatory Visit: Payer: Self-pay

## 2020-05-18 DIAGNOSIS — M109 Gout, unspecified: Secondary | ICD-10-CM

## 2020-05-18 MED ORDER — METHYLPREDNISOLONE 4 MG PO TBPK: ORAL_TABLET | ORAL | 0 refills | Status: DC

## 2020-05-18 NOTE — Telephone Encounter (Signed)
Pt having gout attack- requesting refill on Medrol.

## 2020-05-21 ENCOUNTER — Institutional Professional Consult (permissible substitution): Payer: Medicaid Other | Admitting: Pulmonary Disease

## 2020-05-23 ENCOUNTER — Other Ambulatory Visit: Payer: Self-pay

## 2020-05-23 ENCOUNTER — Ambulatory Visit: Payer: Medicaid Other | Admitting: Internal Medicine

## 2020-05-23 MED ORDER — OXYCODONE HCL 10 MG PO TABS: 5.0000 mg | ORAL_TABLET | Freq: Three times a day (TID) | ORAL | 0 refills | Status: DC | PRN

## 2020-05-23 NOTE — Telephone Encounter (Signed)
Last OV--03/05/2020 Last RF---04/25/2020--#75 no refills

## 2020-06-14 ENCOUNTER — Other Ambulatory Visit: Payer: Self-pay

## 2020-06-14 MED ORDER — OXYCODONE HCL 10 MG PO TABS: 5.0000 mg | ORAL_TABLET | Freq: Three times a day (TID) | ORAL | 0 refills | Status: DC | PRN

## 2020-06-14 NOTE — Telephone Encounter (Signed)
Requesting: oxycodone 10mg  Contract: none UDS: none Last Visit: 03/28/2020 Next Visit: None Last Refill: 05/23/2020 #75 and 0RF Pt sig: 1/2 to 1 tab q8h prn  Pt needing early, he is leaving to go on a fishing trip tomorrow for 10 days  Please Advise
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
## 2020-06-26 ENCOUNTER — Encounter: Payer: Self-pay | Admitting: Internal Medicine

## 2020-06-26 ENCOUNTER — Ambulatory Visit (INDEPENDENT_AMBULATORY_CARE_PROVIDER_SITE_OTHER): Payer: Medicaid Other | Admitting: Internal Medicine

## 2020-06-26 ENCOUNTER — Other Ambulatory Visit: Payer: Self-pay

## 2020-06-26 VITALS — BP 148/98 | HR 91 | Ht 69.0 in | Wt 298.0 lb

## 2020-06-26 DIAGNOSIS — I10 Essential (primary) hypertension: Secondary | ICD-10-CM

## 2020-06-26 DIAGNOSIS — Z794 Long term (current) use of insulin: Secondary | ICD-10-CM

## 2020-06-26 DIAGNOSIS — E118 Type 2 diabetes mellitus with unspecified complications: Secondary | ICD-10-CM

## 2020-06-26 DIAGNOSIS — I5032 Chronic diastolic (congestive) heart failure: Secondary | ICD-10-CM | POA: Diagnosis not present

## 2020-06-26 NOTE — Patient Instructions (Signed)

## 2020-07-03 ENCOUNTER — Ambulatory Visit: Payer: Medicaid Other | Admitting: Internal Medicine

## 2020-07-09 ENCOUNTER — Other Ambulatory Visit: Payer: Self-pay

## 2020-07-09 DIAGNOSIS — M109 Gout, unspecified: Secondary | ICD-10-CM

## 2020-07-09 MED ORDER — OXYCODONE HCL 10 MG PO TABS: 5.0000 mg | ORAL_TABLET | Freq: Three times a day (TID) | ORAL | 0 refills | Status: DC | PRN

## 2020-07-09 MED ORDER — METHYLPREDNISOLONE 4 MG PO TBPK: ORAL_TABLET | ORAL | 0 refills | Status: DC

## 2020-07-27 IMAGING — DX PORTABLE CHEST - 1 VIEW
1 series · 2 of 2 positions shown · non-contrast
Comparison: Chest radiograph dated 05/03/2018

CLINICAL DATA: 53-year-old male with fall and left rib pain.

EXAM:
PORTABLE CHEST 1 VIEW

[Series 1: chest ap · 0.14mm/px · 2 of 2 slices shown]
[im 1/2]
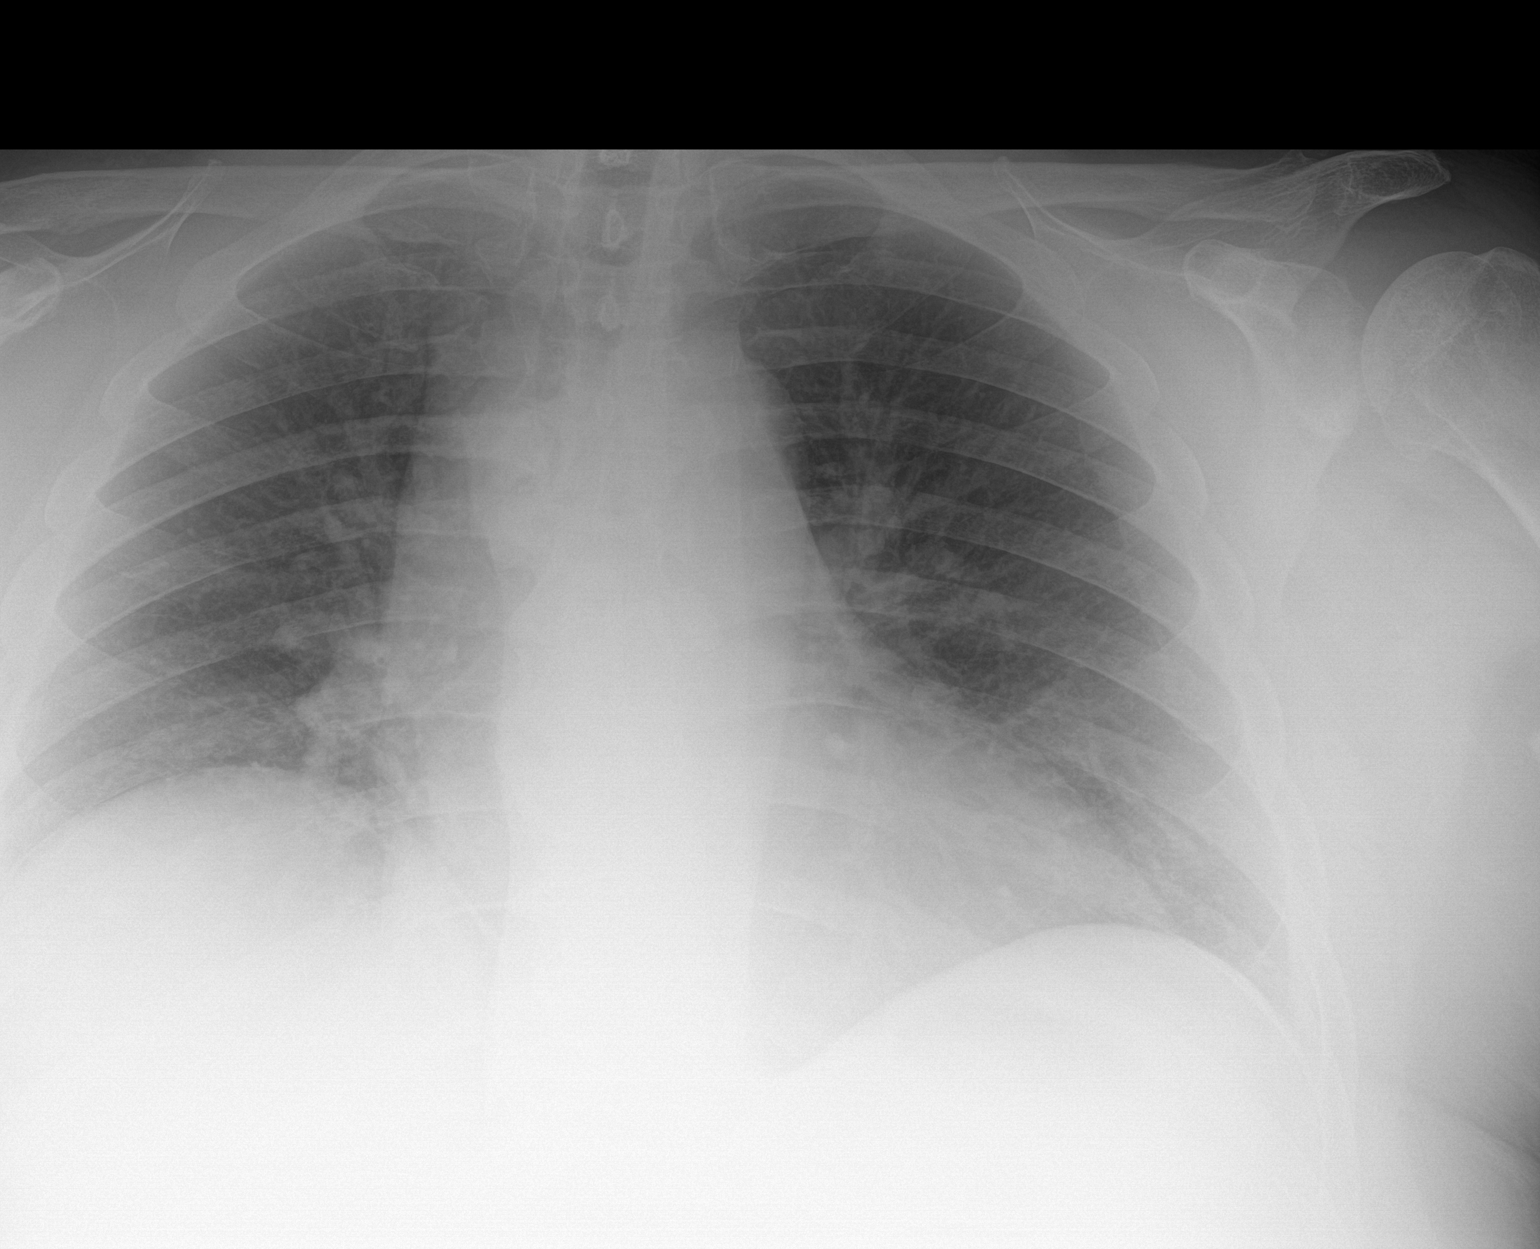
[im 2/2]
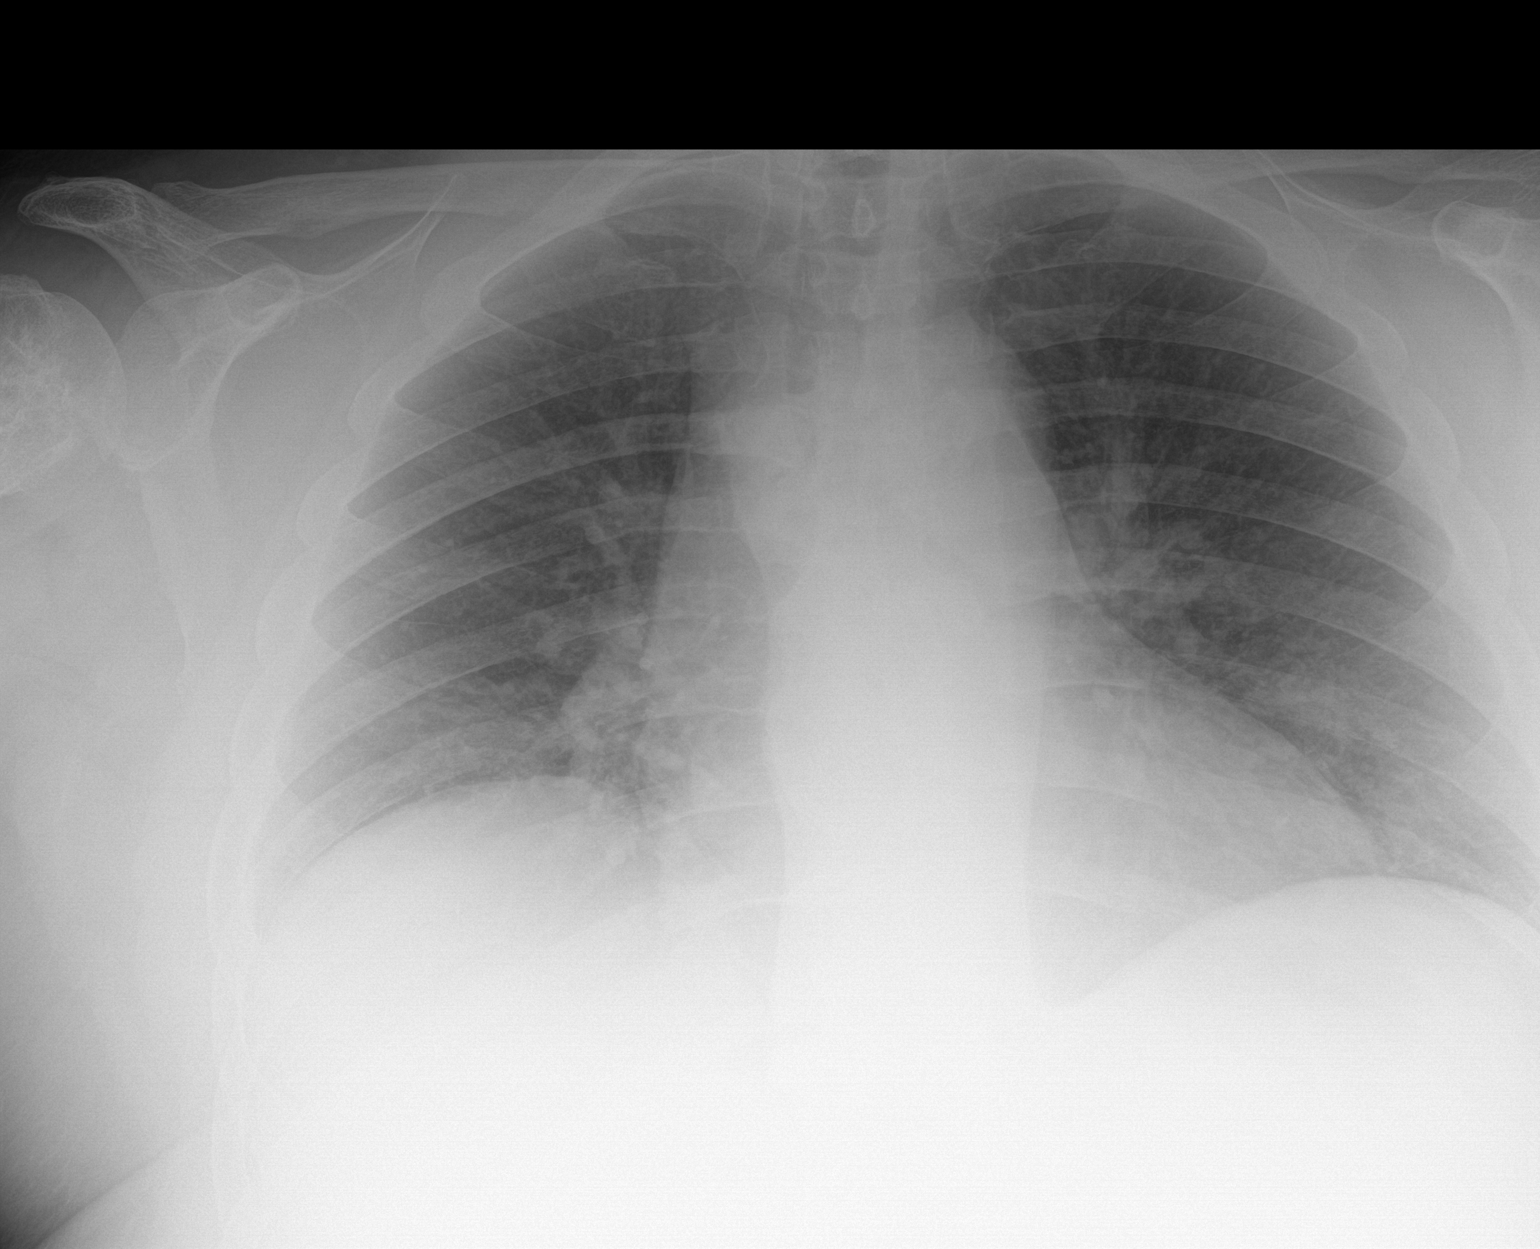

[2 of 2 positions shown; findings below may reference images not displayed]

FINDINGS: Shallow inspiration. No focal consolidation, pleural effusion, or
pneumothorax. Stable cardiomegaly. No acute osseous pathology.
IMPRESSION: No active disease.

## 2020-07-31 ENCOUNTER — Encounter (HOSPITAL_BASED_OUTPATIENT_CLINIC_OR_DEPARTMENT_OTHER): Payer: Self-pay

## 2020-07-31 ENCOUNTER — Emergency Department (HOSPITAL_BASED_OUTPATIENT_CLINIC_OR_DEPARTMENT_OTHER)
Admission: EM | Admit: 2020-07-31 | Discharge: 2020-07-31 | Disposition: A | Discharge status: Home / Self Care | Payer: Medicaid Other | Attending: Emergency Medicine | Admitting: Emergency Medicine

## 2020-07-31 ENCOUNTER — Other Ambulatory Visit: Payer: Self-pay

## 2020-07-31 DIAGNOSIS — I2510 Atherosclerotic heart disease of native coronary artery without angina pectoris: Secondary | ICD-10-CM | POA: Insufficient documentation

## 2020-07-31 DIAGNOSIS — E119 Type 2 diabetes mellitus without complications: Secondary | ICD-10-CM | POA: Insufficient documentation

## 2020-07-31 DIAGNOSIS — R109 Unspecified abdominal pain: Secondary | ICD-10-CM

## 2020-07-31 DIAGNOSIS — M62830 Muscle spasm of back: Secondary | ICD-10-CM | POA: Insufficient documentation

## 2020-07-31 DIAGNOSIS — I509 Heart failure, unspecified: Secondary | ICD-10-CM | POA: Insufficient documentation

## 2020-07-31 DIAGNOSIS — Z79899 Other long term (current) drug therapy: Secondary | ICD-10-CM | POA: Insufficient documentation

## 2020-07-31 DIAGNOSIS — Z96642 Presence of left artificial hip joint: Secondary | ICD-10-CM | POA: Insufficient documentation

## 2020-07-31 DIAGNOSIS — Z7984 Long term (current) use of oral hypoglycemic drugs: Secondary | ICD-10-CM | POA: Insufficient documentation

## 2020-07-31 DIAGNOSIS — I110 Hypertensive heart disease with heart failure: Secondary | ICD-10-CM | POA: Diagnosis not present

## 2020-07-31 LAB — CBC WITH DIFFERENTIAL/PLATELET
Abs Immature Granulocytes: 0.03 10*3/uL (ref 0.00–0.07)
Basophils Absolute: 0.1 10*3/uL (ref 0.0–0.1)
Basophils Relative: 1 %
Eosinophils Absolute: 0.8 10*3/uL — ABNORMAL HIGH (ref 0.0–0.5)
Eosinophils Relative: 7 %
HCT: 37.9 % — ABNORMAL LOW (ref 39.0–52.0)
Hemoglobin: 12.2 g/dL — ABNORMAL LOW (ref 13.0–17.0)
Immature Granulocytes: 0 %
Lymphocytes Relative: 25 %
Lymphs Abs: 2.9 10*3/uL (ref 0.7–4.0)
MCH: 23.8 pg — ABNORMAL LOW (ref 26.0–34.0)
MCHC: 32.2 g/dL (ref 30.0–36.0)
MCV: 74 fL — ABNORMAL LOW (ref 80.0–100.0)
Monocytes Absolute: 0.9 10*3/uL (ref 0.1–1.0)
Monocytes Relative: 7 %
Neutro Abs: 7.1 10*3/uL (ref 1.7–7.7)
Neutrophils Relative %: 60 %
Platelets: 289 10*3/uL (ref 150–400)
RBC: 5.12 MIL/uL (ref 4.22–5.81)
RDW: 15.5 % (ref 11.5–15.5)
WBC: 11.7 10*3/uL — ABNORMAL HIGH (ref 4.0–10.5)
nRBC: 0 % (ref 0.0–0.2)

## 2020-07-31 LAB — URINALYSIS, MICROSCOPIC (REFLEX)

## 2020-07-31 LAB — COMPREHENSIVE METABOLIC PANEL
ALT: 11 U/L (ref 0–44)
AST: 17 U/L (ref 15–41)
Albumin: 3.9 g/dL (ref 3.5–5.0)
Alkaline Phosphatase: 84 U/L (ref 38–126)
Anion gap: 9 (ref 5–15)
BUN: 30 mg/dL — ABNORMAL HIGH (ref 6–20)
CO2: 26 mmol/L (ref 22–32)
Calcium: 9.1 mg/dL (ref 8.9–10.3)
Chloride: 101 mmol/L (ref 98–111)
Creatinine, Ser: 1.67 mg/dL — ABNORMAL HIGH (ref 0.61–1.24)
GFR, Estimated: 47 mL/min — ABNORMAL LOW (ref >60)
Glucose, Bld: 137 mg/dL — ABNORMAL HIGH (ref 70–99)
Potassium: 3.9 mmol/L (ref 3.5–5.1)
Sodium: 136 mmol/L (ref 135–145)
Total Bilirubin: 0.2 mg/dL — ABNORMAL LOW (ref 0.3–1.2)
Total Protein: 7.7 g/dL (ref 6.5–8.1)

## 2020-07-31 LAB — URINALYSIS, ROUTINE W REFLEX MICROSCOPIC
Bilirubin Urine: NEGATIVE
Glucose, UA: NEGATIVE mg/dL
Hgb urine dipstick: NEGATIVE
Ketones, ur: NEGATIVE mg/dL
Nitrite: NEGATIVE
Protein, ur: 100 mg/dL — AB
Specific Gravity, Urine: >1.03 — ABNORMAL HIGH (ref 1.005–1.030)
pH: 5 (ref 5.0–8.0)

## 2020-07-31 MED ORDER — FENTANYL CITRATE (PF) 100 MCG/2ML IJ SOLN
50.0000 ug | Freq: Once | INTRAMUSCULAR | Status: AC
Start: 2020-07-31 — End: 2020-07-31
  Administered 2020-07-31: 50 ug via INTRAVENOUS
  Filled 2020-07-31: qty 2
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
## 2020-07-31 NOTE — Discharge Instructions (Addendum)
You may use over-the-counter Motrin (Ibuprofen), Acetaminophen (Tylenol), topical muscle creams such as SalonPas, Icy Hot, Bengay, etc. Please stretch, apply ice or heat (whichever helps), and have massage therapy for additional assistance.  

## 2020-07-31 NOTE — ED Triage Notes (Signed)
Pt reports right flank pain that woke him up out of his sleep. Pain radiates to front of abd and hip. Has not taken any medication for the pain.

## 2020-07-31 NOTE — Progress Notes (Signed)
Patient has OSA and wears a CPAP with humidity at night.  Placed patient on 10 liter HFNC in per protocol for his OSA.  RT will continue to monitor.

## 2020-08-01 ENCOUNTER — Other Ambulatory Visit: Payer: Self-pay

## 2020-08-01 MED ORDER — OXYCODONE HCL 10 MG PO TABS: 5.0000 mg | ORAL_TABLET | Freq: Three times a day (TID) | ORAL | 0 refills | Status: DC | PRN

## 2020-08-01 NOTE — Telephone Encounter (Signed)
Last OV---03/28/2020 Last RF--#75 no refills on 07/09/2020 [facility] 08/12/2017 No UDS

## 2020-08-02 ENCOUNTER — Ambulatory Visit (INDEPENDENT_AMBULATORY_CARE_PROVIDER_SITE_OTHER): Payer: Medicaid Other | Admitting: Internal Medicine

## 2020-08-02 ENCOUNTER — Encounter: Payer: Self-pay | Admitting: Internal Medicine

## 2020-08-02 ENCOUNTER — Other Ambulatory Visit: Payer: Self-pay

## 2020-08-02 VITALS — BP 184/119 | HR 84 | Temp 97.4°F | Wt 302.0 lb

## 2020-08-02 DIAGNOSIS — T8459XD Infection and inflammatory reaction due to other internal joint prosthesis, subsequent encounter: Secondary | ICD-10-CM

## 2020-08-02 DIAGNOSIS — Z96649 Presence of unspecified artificial hip joint: Secondary | ICD-10-CM

## 2020-08-02 DIAGNOSIS — I10 Essential (primary) hypertension: Secondary | ICD-10-CM

## 2020-08-02 NOTE — Assessment & Plan Note (Signed)
He is doing well and no concerns clinically with his hip.  At this point, he has been off antibiotics over 1 month which is a good test of cure.  If he had ongoing infection in his hip, I expect it would have flared up by now.  He therefore seems to be clear of ongoing infection.  I will recheck his inflammatory markers to day to be sure no significant concerns otherwise.   If ok, he can rtc as needed.

## 2020-08-02 NOTE — Assessment & Plan Note (Signed)
His BP is elevated and he is going to follow up with his new PCP.  He is unable to see his previous PCP due to insurance network.

## 2020-08-02 NOTE — Progress Notes (Signed)
   Subjective:    Patient ID: Justin Hall, male    DOB: [birth year], 55 y.o.   MRN: [id number]  HPI He is here for follow up of a left prosthetic joint  Infection. He is s/p left total hip arthroplasty done in October 2021 followed 4 weeks later with a prosthetic joint infection with MSSA s/p I and D with polyexchange by Dr. Delfino Lovett on February 23, 2020.  He completed 6 weeks of IV cefazolin plus oral rifampin through 04/04/20 and clinically was improving well with reassuring inflammatory markers and transitioned to oral Keflex for a projected 3-6 months.  He missed several follow up appointments with me and here today 4 months out from his IV antibiotics.  He had continued to take the Keflex 4 times a day until about 1 month ago and stopped after missing appointments with me.  His left hip is doing well, good movement and no concerns.  Healing as expected.  He has had no rash or diarrhea.    Review of Systems  Constitutional: Negative for chills and fever.  Gastrointestinal: Negative for diarrhea and nausea.  Skin: Negative for rash.       Objective:   Physical Exam Eyes:     General: No scleral icterus. Pulmonary:     Effort: Pulmonary effort is normal.  Musculoskeletal:     Comments: Left hip with good motion/walking  Skin:    Findings: No rash.  Neurological:     Mental Status: He is alert.   SH: no tobacco        Assessment & Plan:

## 2020-08-03 LAB — C-REACTIVE PROTEIN: CRP: 12 mg/L — ABNORMAL HIGH (ref <8.0)

## 2020-08-03 LAB — SEDIMENTATION RATE: Sed Rate: 22 mm/h — ABNORMAL HIGH (ref 0–20)

## 2020-08-27 ENCOUNTER — Other Ambulatory Visit: Payer: Self-pay

## 2020-08-27 MED ORDER — OXYCODONE HCL 10 MG PO TABS: 5.0000 mg | ORAL_TABLET | Freq: Three times a day (TID) | ORAL | 0 refills | Status: DC | PRN

## 2020-08-27 NOTE — Telephone Encounter (Signed)
Last RF--#75 on 08/01/2020 Last OV--03/28/2020 [facility] 08/12/2017---no UDS

## 2020-08-27 NOTE — Telephone Encounter (Signed)
Duplicate request

## 2020-08-31 ENCOUNTER — Other Ambulatory Visit: Payer: Self-pay | Admitting: Family Medicine

## 2020-08-31 DIAGNOSIS — G2581 Restless legs syndrome: Secondary | ICD-10-CM

## 2020-09-20 ENCOUNTER — Other Ambulatory Visit: Payer: Self-pay

## 2020-09-20 MED ORDER — OXYCODONE HCL 10 MG PO TABS: 5.0000 mg | ORAL_TABLET | Freq: Three times a day (TID) | ORAL | 0 refills | Status: DC | PRN

## 2020-09-20 NOTE — Telephone Encounter (Signed)
Requesting: oxycodone Contract: 08/12/17 UDS:08/12/17 Last Visit: 03/28/20 Next Visit: nothing scheduled Last Refill: 08/27/20  Please Advise

## 2020-09-20 NOTE — Telephone Encounter (Signed)
Refilled, sched appt in the next few weeks please. Ty.

## 2020-10-06 ENCOUNTER — Other Ambulatory Visit: Payer: Self-pay

## 2020-10-07 ENCOUNTER — Other Ambulatory Visit: Payer: Self-pay | Admitting: Family Medicine

## 2020-10-07 MED ORDER — DIAZEPAM 10 MG PO TABS: 5.0000 mg | ORAL_TABLET | Freq: Two times a day (BID) | ORAL | 1 refills | Status: DC | PRN

## 2020-10-08 MED ORDER — CARVEDILOL 6.25 MG PO TABS: 6.2500 mg | ORAL_TABLET | Freq: Two times a day (BID) | ORAL | 0 refills | Status: DC

## 2020-10-13 ENCOUNTER — Other Ambulatory Visit: Payer: Self-pay

## 2020-10-13 DIAGNOSIS — M109 Gout, unspecified: Secondary | ICD-10-CM

## 2020-10-14 ENCOUNTER — Other Ambulatory Visit: Payer: Self-pay

## 2020-10-15 ENCOUNTER — Other Ambulatory Visit: Payer: Self-pay

## 2020-10-15 ENCOUNTER — Other Ambulatory Visit: Payer: Self-pay | Admitting: Family Medicine

## 2020-10-15 DIAGNOSIS — M109 Gout, unspecified: Secondary | ICD-10-CM

## 2020-10-15 MED ORDER — METHYLPREDNISOLONE 4 MG PO TBPK: ORAL_TABLET | ORAL | 0 refills | Status: DC

## 2020-10-15 MED ORDER — OXYCODONE HCL 10 MG PO TABS: 5.0000 mg | ORAL_TABLET | Freq: Three times a day (TID) | ORAL | 0 refills | Status: DC | PRN

## 2020-10-15 NOTE — Telephone Encounter (Signed)
Last OV--03/28/2020 Last RF--#75  on 09/20/20 08/12/2017--[facility] No UDS

## 2020-11-12 ENCOUNTER — Other Ambulatory Visit: Payer: Self-pay

## 2020-11-13 MED ORDER — OXYCODONE HCL 10 MG PO TABS: 5.0000 mg | ORAL_TABLET | Freq: Three times a day (TID) | ORAL | 0 refills | Status: DC | PRN

## 2020-11-13 NOTE — Telephone Encounter (Signed)
Last OV--03/28/2020--- No scheduled upcoming appt. Last RF--09/25/2020---#75 no refills

## 2020-11-21 ENCOUNTER — Encounter (HOSPITAL_BASED_OUTPATIENT_CLINIC_OR_DEPARTMENT_OTHER): Payer: Self-pay | Admitting: *Deleted

## 2020-11-21 ENCOUNTER — Emergency Department (HOSPITAL_BASED_OUTPATIENT_CLINIC_OR_DEPARTMENT_OTHER)
Admission: EM | Admit: 2020-11-21 | Discharge: 2020-11-21 | Disposition: A | Discharge status: Home / Self Care | Payer: Medicaid Other | Attending: Emergency Medicine | Admitting: Emergency Medicine

## 2020-11-21 ENCOUNTER — Other Ambulatory Visit: Payer: Self-pay

## 2020-11-21 DIAGNOSIS — Z5321 Procedure and treatment not carried out due to patient leaving prior to being seen by health care provider: Secondary | ICD-10-CM | POA: Insufficient documentation

## 2020-11-21 DIAGNOSIS — M25572 Pain in left ankle and joints of left foot: Secondary | ICD-10-CM | POA: Diagnosis not present

## 2020-11-21 NOTE — ED Triage Notes (Signed)
C/o Left ankle pain hx gout

## 2020-12-05 ENCOUNTER — Other Ambulatory Visit: Payer: Self-pay | Admitting: Family Medicine

## 2020-12-05 NOTE — Telephone Encounter (Signed)
Last OV--03/28/2020 Last RF--10/07/2020-  #30 with 1 refill

## 2020-12-12 ENCOUNTER — Other Ambulatory Visit: Payer: Self-pay

## 2020-12-12 ENCOUNTER — Other Ambulatory Visit: Payer: Self-pay | Admitting: Family Medicine

## 2020-12-12 MED ORDER — OXYCODONE HCL 10 MG PO TABS: 5.0000 mg | ORAL_TABLET | Freq: Three times a day (TID) | ORAL | 0 refills | Status: DC | PRN

## 2020-12-12 NOTE — Telephone Encounter (Signed)
Last OV--03/25/2020 Last RF--11/13/20---#75

## 2021-01-06 ENCOUNTER — Other Ambulatory Visit: Payer: Self-pay

## 2021-01-06 DIAGNOSIS — M109 Gout, unspecified: Secondary | ICD-10-CM

## 2021-01-07 MED ORDER — CARVEDILOL 6.25 MG PO TABS: 6.2500 mg | ORAL_TABLET | Freq: Two times a day (BID) | ORAL | 0 refills | Status: DC

## 2021-01-07 MED ORDER — OXYCODONE HCL 10 MG PO TABS: 5.0000 mg | ORAL_TABLET | Freq: Three times a day (TID) | ORAL | 0 refills | Status: DC | PRN

## 2021-01-07 MED ORDER — METHYLPREDNISOLONE 4 MG PO TBPK: ORAL_TABLET | ORAL | 0 refills | Status: DC

## 2021-01-07 NOTE — Telephone Encounter (Signed)
One refill at pharmacy for oxycodone Advise on 2 other request.

## 2021-01-28 ENCOUNTER — Emergency Department (HOSPITAL_COMMUNITY)
Admission: EM | Admit: 2021-01-28 | Discharge: 2021-01-28 | Disposition: A | Discharge status: Home / Self Care | Payer: Medicare Other | Attending: Emergency Medicine | Admitting: Emergency Medicine

## 2021-01-28 ENCOUNTER — Emergency Department (HOSPITAL_COMMUNITY): Payer: Medicare Other

## 2021-01-28 ENCOUNTER — Other Ambulatory Visit: Payer: Self-pay

## 2021-01-28 ENCOUNTER — Telehealth: Payer: Self-pay | Admitting: Internal Medicine

## 2021-01-28 DIAGNOSIS — Z5321 Procedure and treatment not carried out due to patient leaving prior to being seen by health care provider: Secondary | ICD-10-CM | POA: Insufficient documentation

## 2021-01-28 DIAGNOSIS — M549 Dorsalgia, unspecified: Secondary | ICD-10-CM | POA: Insufficient documentation

## 2021-01-28 DIAGNOSIS — R0602 Shortness of breath: Secondary | ICD-10-CM | POA: Diagnosis present

## 2021-01-28 NOTE — Telephone Encounter (Signed)
Pt c/o Shortness Of Breath: STAT if SOB developed within the last 24 hours or pt is noticeably SOB on the phone  1. Are you currently SOB (can you hear that pt is SOB on the phone)? Pt states yes, pt sent message to scheduling, did not speak over the phone.   2. How long have you been experiencing SOB? For about a week with some coughing.   3. Are you SOB when sitting or when up moving around? both  4. Are you currently experiencing any other symptoms? Swelling in feet. Pt states he went to the ED this morning but left because of the wait.

## 2021-01-28 NOTE — ED Notes (Signed)
Triage RN made aware of PT's updated vitals

## 2021-01-28 NOTE — ED Triage Notes (Signed)
Pt c/o shortness of breath that started tonight. Pt states it is worse when he lies down. Pt also reports back pain.

## 2021-01-28 NOTE — ED Notes (Signed)
PT left due to wait time

## 2021-01-28 NOTE — Telephone Encounter (Signed)
Attempted to call patient, unable to leave message at this time due to no VM. Will try again at another time.

## 2021-01-29 ENCOUNTER — Other Ambulatory Visit: Payer: Self-pay | Admitting: Family Medicine

## 2021-01-29 NOTE — Telephone Encounter (Signed)
Attempted to call pt. No answer at this time. No voicemail set up, unable to leave a message.

## 2021-01-30 ENCOUNTER — Ambulatory Visit: Payer: Medicaid Other | Admitting: Family Medicine

## 2021-01-30 ENCOUNTER — Ambulatory Visit: Payer: Medicare Other | Admitting: Medical

## 2021-01-30 NOTE — Telephone Encounter (Signed)
Attempted to call pt. No answer at this time. No voicemail set up, unable to leave a message.

## 2021-02-01 ENCOUNTER — Other Ambulatory Visit: Payer: Self-pay

## 2021-02-01 ENCOUNTER — Ambulatory Visit (INDEPENDENT_AMBULATORY_CARE_PROVIDER_SITE_OTHER): Payer: Medicare Other | Admitting: Family Medicine

## 2021-02-01 ENCOUNTER — Encounter: Payer: Self-pay | Admitting: Family Medicine

## 2021-02-01 VITALS — BP 152/92 | HR 90 | Temp 98.3°F | Ht 69.0 in | Wt 331.1 lb

## 2021-02-01 DIAGNOSIS — I10 Essential (primary) hypertension: Secondary | ICD-10-CM

## 2021-02-01 DIAGNOSIS — R059 Cough, unspecified: Secondary | ICD-10-CM

## 2021-02-01 DIAGNOSIS — M1A9XX0 Chronic gout, unspecified, without tophus (tophi): Secondary | ICD-10-CM

## 2021-02-01 DIAGNOSIS — G4733 Obstructive sleep apnea (adult) (pediatric): Secondary | ICD-10-CM

## 2021-02-01 DIAGNOSIS — Z9989 Dependence on other enabling machines and devices: Secondary | ICD-10-CM

## 2021-02-01 DIAGNOSIS — R4781 Slurred speech: Secondary | ICD-10-CM

## 2021-02-01 DIAGNOSIS — E1165 Type 2 diabetes mellitus with hyperglycemia: Secondary | ICD-10-CM | POA: Diagnosis not present

## 2021-02-01 MED ORDER — OXYCODONE HCL 10 MG PO TABS: 5.0000 mg | ORAL_TABLET | Freq: Three times a day (TID) | ORAL | 0 refills | Status: DC | PRN

## 2021-02-01 MED ORDER — ALBUTEROL SULFATE HFA 108 (90 BASE) MCG/ACT IN AERS: 2.0000 | INHALATION_SPRAY | Freq: Four times a day (QID) | RESPIRATORY_TRACT | 0 refills | Status: DC | PRN
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
## 2021-02-01 NOTE — Patient Instructions (Addendum)
Give Justin Hall 2-3 business days to get the results of your labs back.   Keep the diet clean and stay active.  Aim to do some physical exertion for 150 minutes per week. This is typically divided into 5 days per week, 30 minutes per day. The activity should be enough to get your heart rate up. Anything is better than nothing if you have time constraints.  We need to get back on the wagon with your eating.   Someone will reach out in the next few days regarding your MRI.  Use the inhaler as needed for the wheezing cough.   If you do not hear anything about your referrals in the next 1-2 weeks, call our office and ask for an update.  Let Justin Hall know if you need anything.

## 2021-02-01 NOTE — Telephone Encounter (Cosign Needed)
Last OV--02/01/21 Last RF--01/07/2021 [facility] 08/12/2017

## 2021-02-04 ENCOUNTER — Other Ambulatory Visit: Payer: Medicare Other

## 2021-02-07 ENCOUNTER — Ambulatory Visit: Payer: Medicare Other | Admitting: Family Medicine

## 2021-02-08 ENCOUNTER — Other Ambulatory Visit: Payer: Self-pay

## 2021-02-08 ENCOUNTER — Telehealth: Payer: Self-pay | Admitting: Internal Medicine

## 2021-02-08 ENCOUNTER — Telehealth: Payer: Self-pay

## 2021-02-08 ENCOUNTER — Other Ambulatory Visit: Payer: Self-pay | Admitting: Family Medicine

## 2021-02-08 ENCOUNTER — Other Ambulatory Visit (INDEPENDENT_AMBULATORY_CARE_PROVIDER_SITE_OTHER): Payer: Medicare Other

## 2021-02-08 DIAGNOSIS — M1A9XX0 Chronic gout, unspecified, without tophus (tophi): Secondary | ICD-10-CM | POA: Diagnosis not present

## 2021-02-08 DIAGNOSIS — R0602 Shortness of breath: Secondary | ICD-10-CM

## 2021-02-08 DIAGNOSIS — E1165 Type 2 diabetes mellitus with hyperglycemia: Secondary | ICD-10-CM

## 2021-02-08 DIAGNOSIS — R4781 Slurred speech: Secondary | ICD-10-CM

## 2021-02-08 LAB — COMPREHENSIVE METABOLIC PANEL
ALT: 9 U/L (ref 0–53)
AST: 13 U/L (ref 0–37)
Albumin: 4.3 g/dL (ref 3.5–5.2)
Alkaline Phosphatase: 87 U/L (ref 39–117)
BUN: 24 mg/dL — ABNORMAL HIGH (ref 6–23)
CO2: 33 mEq/L — ABNORMAL HIGH (ref 19–32)
Calcium: 9.2 mg/dL (ref 8.4–10.5)
Chloride: 98 mEq/L (ref 96–112)
Creatinine, Ser: 1.32 mg/dL (ref 0.40–1.50)
GFR: 59.82 mL/min — ABNORMAL LOW (ref >60.00)
Glucose, Bld: 153 mg/dL — ABNORMAL HIGH (ref 70–99)
Potassium: 3.8 mEq/L (ref 3.5–5.1)
Sodium: 140 mEq/L (ref 135–145)
Total Bilirubin: 0.6 mg/dL (ref 0.2–1.2)
Total Protein: 7.7 g/dL (ref 6.0–8.3)

## 2021-02-08 LAB — CBC
HCT: 39.5 % (ref 39.0–52.0)
Hemoglobin: 13 g/dL (ref 13.0–17.0)
MCHC: 32.8 g/dL (ref 30.0–36.0)
MCV: 76.4 fl — ABNORMAL LOW (ref 78.0–100.0)
Platelets: 258 10*3/uL (ref 150.0–400.0)
RBC: 5.17 Mil/uL (ref 4.22–5.81)
RDW: 16.9 % — ABNORMAL HIGH (ref 11.5–15.5)
WBC: 11.1 10*3/uL — ABNORMAL HIGH (ref 4.0–10.5)

## 2021-02-08 LAB — URIC ACID: Uric Acid, Serum: 9 mg/dL — ABNORMAL HIGH (ref 4.0–7.8)

## 2021-02-08 LAB — LDL CHOLESTEROL, DIRECT: Direct LDL: 145 mg/dL

## 2021-02-08 LAB — MICROALBUMIN / CREATININE URINE RATIO
Creatinine,U: 149.9 mg/dL
Microalb Creat Ratio: 65.4 mg/g — ABNORMAL HIGH (ref 0.0–30.0)
Microalb, Ur: 98.1 mg/dL — ABNORMAL HIGH (ref 0.0–1.9)

## 2021-02-08 LAB — LIPID PANEL
Cholesterol: 221 mg/dL — ABNORMAL HIGH (ref 0–200)
HDL: 38.1 mg/dL — ABNORMAL LOW (ref >39.00)
NonHDL: 182.47
Total CHOL/HDL Ratio: 6
Triglycerides: 275 mg/dL — ABNORMAL HIGH (ref 0.0–149.0)
VLDL: 55 mg/dL — ABNORMAL HIGH (ref 0.0–40.0)

## 2021-02-08 LAB — HEMOGLOBIN A1C: Hgb A1c MFr Bld: 7.4 % — ABNORMAL HIGH (ref 4.6–6.5)

## 2021-02-08 NOTE — Telephone Encounter (Signed)
Ordered- okay'ed per Dr Nani Ravens

## 2021-02-08 NOTE — Telephone Encounter (Signed)
Patient is calling he has an appointment for labs today at 1:15 and needs a Pro BNP per his cardiologist Coletta Memos, NP and wants to know if you could order this test for him. Please advise and Thanks.

## 2021-02-08 NOTE — Telephone Encounter (Signed)
Patient sent message to scheduling regarding symptoms, scheduled pt for 11/08 with Coletta Memos.   Pt c/o swelling: STAT is pt has developed SOB within 24 hours  If swelling, where is the swelling located? feet  How much weight have you gained and in what time span? 32 pounds in 2 months  Have you gained 3 pounds in a day or 5 pounds in a week? Not sure, pt sent message to scheduling regarding symptoms.  Do you have a log of your daily weights (if so, list)? no  Are you currently taking a fluid pill? yes  Are you currently SOB? SOB when doing simple tasks such as leaning over. Patient sent message back on 10/10 regarding SOB but looks like noone was able to get in contact with him. Patient contacted 3x regarding SOB per phone note.   Have you traveled recently? unsure

## 2021-02-08 NOTE — Telephone Encounter (Signed)
Called patient to offer appointment for next week.  Patient did not answer- will attempt on Monday.  Unable to leave voicemail.

## 2021-02-08 NOTE — Telephone Encounter (Signed)
Pt called to report that he has been having weight gain 298 lbs on 06/26/20 and today he is 331 lbs... most of the weight over the last 2 months.   He has been having increased Sob with exertion, he did not sound uncomfortable on the call with me today.   His feet have been swollen and his belly area.   He was in the ED 01/28/21 and only had an EKG and he left after waiting too long... EKG was NSR with rate 94.   He has since spoke with his PCP Dr. Nani Ravens and he has reported that he has not been taking his meds.. so he restarted his Lasix and he stated he has been urinating more and noticed that his edema has been improving some.. his BP in the ED was 190/120 and after taking his meds it is now 160/80.   He has follow up with Coletta Memos NP 02/26/21... he is having labwork with Dr. Nani Ravens in today and will be in EPIC... he will ask if he feels a Pro BNP would be beneficial.   I will forward to Dr. Margaretann Loveless for review.. I was unable to make him a sooner appt for next week.   Last Echo 09/2018

## 2021-02-09 ENCOUNTER — Other Ambulatory Visit: Payer: Self-pay | Admitting: Family Medicine

## 2021-02-09 LAB — PRO B NATRIURETIC PEPTIDE: NT-Pro BNP: 527 pg/mL — ABNORMAL HIGH (ref 0–210)

## 2021-02-09 MED ORDER — FUROSEMIDE 40 MG PO TABS: 40.0000 mg | ORAL_TABLET | Freq: Two times a day (BID) | ORAL | 2 refills | Status: DC

## 2021-02-11 ENCOUNTER — Other Ambulatory Visit: Payer: Self-pay

## 2021-02-11 ENCOUNTER — Encounter: Payer: Self-pay | Admitting: Family Medicine

## 2021-02-11 ENCOUNTER — Ambulatory Visit (INDEPENDENT_AMBULATORY_CARE_PROVIDER_SITE_OTHER): Payer: Medicare Other | Admitting: Family Medicine

## 2021-02-11 VITALS — BP 144/98 | HR 83 | Temp 98.1°F | Ht 69.0 in | Wt 318.0 lb

## 2021-02-11 DIAGNOSIS — I10 Essential (primary) hypertension: Secondary | ICD-10-CM | POA: Diagnosis not present

## 2021-02-11 DIAGNOSIS — R0600 Dyspnea, unspecified: Secondary | ICD-10-CM | POA: Diagnosis not present

## 2021-02-11 MED ORDER — TRAZODONE HCL 50 MG PO TABS: 25.0000 mg | ORAL_TABLET | Freq: Every evening | ORAL | 3 refills | Status: DC | PRN

## 2021-02-11 MED ORDER — INDOMETHACIN 50 MG PO CAPS: ORAL_CAPSULE | ORAL | 0 refills | Status: DC

## 2021-02-11 MED ORDER — AMLODIPINE BESYLATE 5 MG PO TABS: 5.0000 mg | ORAL_TABLET | Freq: Every day | ORAL | 1 refills | Status: DC

## 2021-02-11 MED ORDER — METFORMIN HCL 500 MG PO TABS: 1000.0000 mg | ORAL_TABLET | Freq: Two times a day (BID) | ORAL | 2 refills | Status: DC

## 2021-02-11 MED ORDER — CHLORTHALIDONE 25 MG PO TABS: 25.0000 mg | ORAL_TABLET | Freq: Every day | ORAL | 2 refills | Status: DC

## 2021-02-11 NOTE — Patient Instructions (Addendum)
Keep the diet clean and stay active. Very strong work.   For the MRI, call: [phone]  For the swelling in your lower extremities, be sure to elevate your legs when able, mind the salt intake, stay physically active and consider wearing compression stockings.  Let us know if you need anything.
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
## 2021-02-14 NOTE — Telephone Encounter (Signed)
Returned call to patient to check to see how he is doing. Patient states he saw his PCP last week and was started on Chlorthalidone along with his other diuretic and states he has lost over 19 pounds of fluid in a little over a week. Patient states that he feels better, his Oxygen level is 96%, breathing feels better,and swelling is better. Patient states he had a Pro BNP done and would like Dr. Margaretann Loveless to look at it as well, and is going back next week for repeat blood work at his PCP. Patient states that he has an appointment with Coletta Memos for 11/8 but if Dr. Margaretann Loveless would like him to be seen sooner then he is okay with that. Patient states he is okay to wait until 11/8 though. Advised patient I would forward his message to Dr. Margaretann Loveless for her to review and advise. Patient verbalized understanding.

## 2021-02-18 ENCOUNTER — Other Ambulatory Visit: Payer: Self-pay

## 2021-02-18 ENCOUNTER — Other Ambulatory Visit (INDEPENDENT_AMBULATORY_CARE_PROVIDER_SITE_OTHER): Payer: Medicare Other

## 2021-02-18 DIAGNOSIS — I10 Essential (primary) hypertension: Secondary | ICD-10-CM | POA: Diagnosis not present

## 2021-02-18 LAB — BASIC METABOLIC PANEL
BUN: 42 mg/dL — ABNORMAL HIGH (ref 6–23)
CO2: 32 mEq/L (ref 19–32)
Calcium: 8.9 mg/dL (ref 8.4–10.5)
Chloride: 99 mEq/L (ref 96–112)
Creatinine, Ser: 1.64 mg/dL — ABNORMAL HIGH (ref 0.40–1.50)
GFR: 46.1 mL/min — ABNORMAL LOW (ref >60.00)
Glucose, Bld: 159 mg/dL — ABNORMAL HIGH (ref 70–99)
Potassium: 4.1 mEq/L (ref 3.5–5.1)
Sodium: 141 mEq/L (ref 135–145)
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
## 2021-02-26 ENCOUNTER — Other Ambulatory Visit: Payer: Self-pay

## 2021-02-26 ENCOUNTER — Ambulatory Visit: Payer: Medicare Other | Admitting: General Practice

## 2021-02-26 MED ORDER — OXYCODONE HCL 10 MG PO TABS: 5.0000 mg | ORAL_TABLET | Freq: Three times a day (TID) | ORAL | 0 refills | Status: DC | PRN

## 2021-02-26 NOTE — Telephone Encounter (Signed)
Last Refill--02/01/2021--#75 no refills Last OV--02/11/2021

## 2021-03-04 ENCOUNTER — Other Ambulatory Visit: Payer: Self-pay

## 2021-03-04 ENCOUNTER — Ambulatory Visit (INDEPENDENT_AMBULATORY_CARE_PROVIDER_SITE_OTHER): Payer: Medicare Other | Admitting: Family Medicine

## 2021-03-04 ENCOUNTER — Other Ambulatory Visit: Payer: Self-pay | Admitting: Family Medicine

## 2021-03-04 ENCOUNTER — Encounter: Payer: Self-pay | Admitting: Family Medicine

## 2021-03-04 VITALS — BP 130/86 | HR 88 | Temp 97.8°F | Ht 69.0 in | Wt 308.5 lb

## 2021-03-04 DIAGNOSIS — Z1211 Encounter for screening for malignant neoplasm of colon: Secondary | ICD-10-CM | POA: Diagnosis not present

## 2021-03-04 DIAGNOSIS — Z23 Encounter for immunization: Secondary | ICD-10-CM

## 2021-03-04 DIAGNOSIS — I10 Essential (primary) hypertension: Secondary | ICD-10-CM | POA: Diagnosis not present

## 2021-03-04 DIAGNOSIS — E1165 Type 2 diabetes mellitus with hyperglycemia: Secondary | ICD-10-CM

## 2021-03-04 MED ORDER — LISINOPRIL 20 MG PO TABS: 20.0000 mg | ORAL_TABLET | Freq: Every day | ORAL | 2 refills | Status: DC

## 2021-03-04 MED ORDER — METFORMIN HCL ER 500 MG PO TB24: 1000.0000 mg | ORAL_TABLET | Freq: Two times a day (BID) | ORAL | 3 refills | Status: DC

## 2021-03-04 NOTE — Patient Instructions (Addendum)
Stay hydrated.  Take Metamucil or Benefiber daily.  Strong work with the weight loss.   Check your blood pressures 2-3 times per week, alternating the time of day you check it. If it is high, considering waiting 1-2 minutes and rechecking. If it gets higher, your anxiety is likely creeping up and we should avoid rechecking.   The new Shingrix vaccine (for shingles) is a 2 shot series. It can make people feel low energy, achy and almost like they have the flu for 48 hours after injection. Please plan accordingly when deciding on when to get this shot. Call our office for a nurse visit appointment to get this. The second shot of the series is less severe regarding the side effects, but it still lasts 48 hours.   If you do not hear anything about your referral in the next 1-2 weeks, call our office and ask for an update.  Let us know if you need anything.

## 2021-03-04 NOTE — Addendum Note (Signed)
Addended by: Sharon Seller B on: 03/04/2021 08:10 AM   Modules accepted: Orders
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
## 2021-03-07 ENCOUNTER — Other Ambulatory Visit: Payer: Self-pay | Admitting: Family Medicine

## 2021-03-12 ENCOUNTER — Other Ambulatory Visit: Payer: Self-pay | Admitting: Family Medicine

## 2021-03-26 ENCOUNTER — Other Ambulatory Visit: Payer: Self-pay | Admitting: Family Medicine

## 2021-03-26 MED ORDER — OXYCODONE HCL 10 MG PO TABS: 5.0000 mg | ORAL_TABLET | Freq: Three times a day (TID) | ORAL | 0 refills | Status: DC | PRN

## 2021-03-26 NOTE — Telephone Encounter (Signed)
Justin Munroe, MD  You 3 days ago   No showed for visit with jesse in Nov. Please arrange 6 mo f/u with APP recall.  [place]     Recall for 6 month follow with APP placed.

## 2021-03-26 NOTE — Telephone Encounter (Signed)
Last OV--03/04/2021 Last RF--02/26/21--#75 no refills

## 2021-04-07 ENCOUNTER — Other Ambulatory Visit: Payer: Self-pay | Admitting: Family Medicine

## 2021-04-07 DIAGNOSIS — M1A9XX0 Chronic gout, unspecified, without tophus (tophi): Secondary | ICD-10-CM

## 2021-04-18 ENCOUNTER — Telehealth: Payer: Self-pay | Admitting: Family Medicine

## 2021-04-18 NOTE — Telephone Encounter (Signed)
pt called and requested medication for cellulitis. pt stated he has been treated in the past. please advise.

## 2021-04-19 NOTE — Telephone Encounter (Signed)
Called the patient  and no answer//VM not set up yet so unable to leave a msg.

## 2021-04-19 NOTE — Telephone Encounter (Signed)
Called the patient no answer/VM not set up yet

## 2021-04-19 NOTE — Telephone Encounter (Signed)
Patient called back and scheduled with PCP for 04/23/2021.

## 2021-04-23 ENCOUNTER — Ambulatory Visit: Payer: Medicare Other | Admitting: Family Medicine

## 2021-04-24 ENCOUNTER — Other Ambulatory Visit: Payer: Self-pay | Admitting: Family Medicine

## 2021-04-24 MED ORDER — OXYCODONE HCL 10 MG PO TABS: 5.0000 mg | ORAL_TABLET | Freq: Three times a day (TID) | ORAL | 0 refills | Status: DC | PRN

## 2021-04-24 NOTE — Telephone Encounter (Signed)
Last OV--03/04/21 Last refill --- 03/26/2021--#75 refills

## 2021-05-12 ENCOUNTER — Emergency Department (HOSPITAL_BASED_OUTPATIENT_CLINIC_OR_DEPARTMENT_OTHER): Payer: Medicare Other

## 2021-05-12 ENCOUNTER — Other Ambulatory Visit: Payer: Self-pay

## 2021-05-12 ENCOUNTER — Encounter (HOSPITAL_BASED_OUTPATIENT_CLINIC_OR_DEPARTMENT_OTHER): Payer: Self-pay | Admitting: Emergency Medicine

## 2021-05-12 ENCOUNTER — Emergency Department (HOSPITAL_BASED_OUTPATIENT_CLINIC_OR_DEPARTMENT_OTHER)
Admission: EM | Admit: 2021-05-12 | Discharge: 2021-05-12 | Disposition: A | Discharge status: Home / Self Care | Payer: Medicare Other | Attending: Emergency Medicine | Admitting: Emergency Medicine

## 2021-05-12 DIAGNOSIS — M25551 Pain in right hip: Secondary | ICD-10-CM | POA: Diagnosis not present

## 2021-05-12 DIAGNOSIS — M62838 Other muscle spasm: Secondary | ICD-10-CM | POA: Insufficient documentation

## 2021-05-12 DIAGNOSIS — Z7984 Long term (current) use of oral hypoglycemic drugs: Secondary | ICD-10-CM | POA: Diagnosis not present

## 2021-05-12 MED ORDER — DIAZEPAM 5 MG PO TABS
5.0000 mg | ORAL_TABLET | Freq: Once | ORAL | Status: AC
  Administered 2021-05-12: 5 mg via ORAL
  Filled 2021-05-12: qty 1

## 2021-05-12 MED ORDER — METHOCARBAMOL 500 MG PO TABS: 500.0000 mg | ORAL_TABLET | Freq: Two times a day (BID) | ORAL | 0 refills | Status: DC

## 2021-05-12 MED ORDER — NAPROXEN 250 MG PO TABS
500.0000 mg | ORAL_TABLET | Freq: Once | ORAL | Status: AC
  Administered 2021-05-12: 500 mg via ORAL
  Filled 2021-05-12: qty 2

## 2021-05-12 NOTE — ED Triage Notes (Signed)
Reports chronic right hip pain that will need surgery soon.  Reports he stumbled tonight causing him to twist causing increased pain with spasms in the right leg.

## 2021-05-12 NOTE — ED Provider Notes (Signed)
[facility] EMERGENCY DEPARTMENT Provider Note   CSN: [id number] Arrival date & time: 05/12/21  0220     History  Chief Complaint  Patient presents with   Hip Pain    Justin Hall is a 56 y.o. male.  HPI     56 year old male comes in with chief complaint of hip pain  Patient reports that about 3 or 4 hours ago he tripped and lost his balance.  He did not fall, but the event did lead to some discomfort over his right hip.  Ever since that he has been having spasming feeling over the right posterior and lateral hip.  No pain elsewhere.  No history of hip surgery on the right side.  Home Medications Prior to Admission medications   Medication Sig Start Date End Date Taking? Authorizing Provider  methocarbamol (ROBAXIN) 500 MG tablet Take 1 tablet (500 mg total) by mouth 2 (two) times daily. 05/12/21  Yes Varney Biles, MD  albuterol (VENTOLIN HFA) 108 (90 Base) MCG/ACT inhaler Inhale 2 puffs into the lungs every 6 (six) hours as needed for wheezing or shortness of breath. 02/01/21   [name], DO  allopurinol (ZYLOPRIM) 300 MG tablet Take 1 tablet by mouth twice daily 04/08/21   Nani Ravens, Crosby Oyster, DO  amLODipine (NORVASC) 5 MG tablet Take 1 tablet (5 mg total) by mouth daily. 02/11/21   [name], DO  atorvastatin (LIPITOR) 80 MG tablet Take 1 tablet by mouth once daily 04/08/21   [name], DO  blood glucose meter kit and supplies Dispense based on patient and insurance preference. Use up to four times daily as directed. (FOR ICD-10 E10.9, E11.9). 10/21/18   Lavina Hamman, MD  carvedilol (COREG) 6.25 MG tablet TAKE 1 TABLET BY MOUTH TWICE A DAY 03/12/21   [name], DO  chlorthalidone (HYGROTON) 25 MG tablet Take 1 tablet (25 mg total) by mouth daily. 02/11/21   [name], DO  furosemide (LASIX) 40 MG tablet Take 1 tablet by mouth twice daily 04/08/21   Wendling, Crosby Oyster, DO  indomethacin  (INDOCIN) 50 MG capsule Take 1 capsule 2 times daily as needed for flares. 02/11/21   [name], DO  lisinopril (ZESTRIL) 20 MG tablet Take 1 tablet (20 mg total) by mouth daily. 03/04/21   [name], DO  metFORMIN (GLUCOPHAGE XR) 500 MG 24 hr tablet Take 2 tablets (1,000 mg total) by mouth in the morning and at bedtime. 03/04/21   [name], DO  Oxycodone HCl 10 MG TABS Take 0.5-1 tablets (5-10 mg total) by mouth every 8 (eight) hours as needed (Pain). 04/24/21   [name], DO  rOPINIRole (REQUIP) 4 MG tablet TAKE 1 TABLET BY MOUTH AT 8AM FOR RESTLESS LEGS AND 2 AT 5PM 08/31/20   [name], DO  traZODone (DESYREL) 50 MG tablet TAKE 0.5-1 TABLETS BY MOUTH AT BEDTIME AS NEEDED FOR SLEEP. 03/07/21   [name], DO      Allergies    Patient has no known allergies.    Review of Systems   Review of Systems  Physical Exam Updated Vital Signs BP (!) 189/119 (BP Location: Left Arm)    Pulse (!) 103    Temp 98.1 F (36.7 C) (Oral)    Resp 20    Ht 5' 9" (1.753 m)    Wt 127.5 kg    SpO2 95%    BMI 41.50 kg/m  Physical Exam  Vitals and nursing note reviewed.  Constitutional:      Appearance: He is well-developed.  Cardiovascular:     Rate and Rhythm: Normal rate.  Pulmonary:     Effort: Pulmonary effort is normal.  Musculoskeletal:        General: Tenderness present. No swelling or deformity.     Comments: Reproducible tenderness over the posterior lateral hip, proximal thigh  Skin:    General: Skin is warm.  Neurological:     Mental Status: He is alert and oriented to person, place, and time.    ED Results / Procedures / Treatments   Labs (all labs ordered are listed, but only abnormal results are displayed) Labs Reviewed - No data to display  EKG None  Radiology DG Hip Unilat W or Wo Pelvis 2-3 Views Right  Result Date: 05/12/2021 CLINICAL DATA:  Hip pain. EXAM: DG HIP (WITH OR WITHOUT PELVIS) 2-3V  RIGHT COMPARISON:  Right hip radiograph dated 03/17/2019. FINDINGS: There is a total left hip arthroplasty. The arthroplasty components appear intact. There is no acute fracture or dislocation. The bones are osteopenic. There is moderate arthritic changes of the right hip. The soft tissues are unremarkable. IMPRESSION: 1. No acute fracture or dislocation. 2. Moderate arthritic changes of the right hip. Electronically Signed   By: Anner Crete M.D.   On: 05/12/2021 03:01    Procedures Procedures    Medications Ordered in ED Medications  diazepam (VALIUM) tablet 5 mg (5 mg Oral Given 05/12/21 0257)  naproxen (NAPROSYN) tablet 500 mg (500 mg Oral Given 05/12/21 0257)    ED Course/ Medical Decision Making/ A&P                           Medical Decision Making Problems Addressed: Right hip pain: self-limited or minor problem Spasm of muscle: self-limited or minor problem  Amount and/or Complexity of Data Reviewed External Data Reviewed: labs. Radiology: ordered and independent interpretation performed.    Details: X-rays reviewed independently, no evidence of dislocation or fracture.  Risk OTC drugs. Prescription drug management.   56 year old male comes in with chief complaint of hip pain He has history of left-sided hip replacement.  Had a mechanical trip earlier today, leading to right-sided hip pain and spasms.  Differential diagnosis includes occult fracture, dislocation, musculoskeletal strain/sprain.  On exam, there is no evidence of hip dislocation as there is no shortening.  X-rays reviewed independently, no evidence of any fracture or dislocation.  Patient was given Valium and Naprosyn, he reports that his spasming pain has completely resolved.  I reviewed patient's prior lab work-up, his creatinine is slightly elevated therefore we will advise him to just simply take Robaxin for now and consider heat therapy.          Final Clinical Impression(s) / ED  Diagnoses Final diagnoses:  Spasm of muscle  Right hip pain    Rx / DC Orders ED Discharge Orders          Ordered    methocarbamol (ROBAXIN) 500 MG tablet  2 times daily        05/12/21 0424              Varney Biles, MD 05/12/21 [id number]

## 2021-05-20 ENCOUNTER — Other Ambulatory Visit: Payer: Self-pay | Admitting: Family Medicine

## 2021-05-20 MED ORDER — OXYCODONE HCL 10 MG PO TABS: 5.0000 mg | ORAL_TABLET | Freq: Three times a day (TID) | ORAL | 0 refills | Status: DC | PRN

## 2021-05-20 MED ORDER — CARVEDILOL 6.25 MG PO TABS: 6.2500 mg | ORAL_TABLET | Freq: Two times a day (BID) | ORAL | 0 refills | Status: DC

## 2021-05-20 NOTE — Telephone Encounter (Signed)
Last OV--02/22/2021 Last RF--04/24/2021--#75 no refills

## 2021-05-21 ENCOUNTER — Ambulatory Visit (INDEPENDENT_AMBULATORY_CARE_PROVIDER_SITE_OTHER): Payer: Medicare Other | Admitting: Pulmonary Disease

## 2021-05-21 ENCOUNTER — Other Ambulatory Visit: Payer: Self-pay

## 2021-05-21 ENCOUNTER — Encounter: Payer: Self-pay | Admitting: Pulmonary Disease

## 2021-05-21 ENCOUNTER — Other Ambulatory Visit: Payer: Self-pay | Admitting: Family Medicine

## 2021-05-21 VITALS — BP 124/80 | HR 64 | Temp 97.9°F | Ht 69.0 in | Wt 291.4 lb

## 2021-05-21 DIAGNOSIS — G4733 Obstructive sleep apnea (adult) (pediatric): Secondary | ICD-10-CM | POA: Diagnosis not present
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
## 2021-05-21 NOTE — Patient Instructions (Signed)
History of obstructive sleep apnea with patient becoming dysfunctional  We will schedule you for split-night study  Update you with results as soon as reviewed  Contact DME company for a new machine  Continue weight loss efforts  Follow-up in 3 to 4 months  Sleep Apnea Sleep apnea affects breathing during sleep. It causes breathing to stop for 10 seconds or more, or to become shallow. People with sleep apnea usually snore loudly. It can also increase the risk of: Heart attack. Stroke. Being very overweight (obese). Diabetes. Heart failure. Irregular heartbeat. High blood pressure. The goal of treatment is to help you breathe normally again. What are the causes? The most common cause of this condition is a collapsed or blocked airway. There are three kinds of sleep apnea: Obstructive sleep apnea. This is caused by a blocked or collapsed airway. Central sleep apnea. This happens when the brain does not send the right signals to the muscles that control breathing. Mixed sleep apnea. This is a combination of obstructive and central sleep apnea. What increases the risk? Being overweight. Smoking. Having a small airway. Being older. Being male. Drinking alcohol. Taking medicines to calm yourself (sedatives or tranquilizers). Having family members with the condition. Having a tongue or tonsils that are larger than normal. What are the signs or symptoms? Trouble staying asleep. Loud snoring. Headaches in the morning. Waking up gasping. Dry mouth or sore throat in the morning. Being sleepy or tired during the day. If you are sleepy or tired during the day, you may also: Not be able to focus your mind (concentrate). Forget things. Get angry a lot and have mood swings. Feel sad (depressed). Have changes in your personality. Have less interest in sex, if you are male. Be unable to have an erection, if you are male. How is this treated?  Sleeping on your side. Using a  medicine to get rid of mucus in your nose (decongestant). Avoiding the use of alcohol, medicines to help you relax, or certain pain medicines (narcotics). Losing weight, if needed. Changing your diet. Quitting smoking. Using a machine to open your airway while you sleep, such as: An oral appliance. This is a mouthpiece that shifts your lower jaw forward. A CPAP device. This device blows air through a mask when you breathe out (exhale). An EPAP device. This has valves that you put in each nostril. A BIPAP device. This device blows air through a mask when you breathe in (inhale) and breathe out. Having surgery if other treatments do not work. Follow these instructions at home: Lifestyle Make changes that your doctor recommends. Eat a healthy diet. Lose weight if needed. Avoid alcohol, medicines to help you relax, and some pain medicines. Do not smoke or use any products that contain nicotine or tobacco. If you need help quitting, ask your doctor. General instructions Take over-the-counter and prescription medicines only as told by your doctor. If you were given a machine to use while you sleep, use it only as told by your doctor. If you are having surgery, make sure to tell your doctor you have sleep apnea. You may need to bring your device with you. Keep all follow-up visits. Contact a doctor if: The machine that you were given to use during sleep bothers you or does not seem to be working. You do not get better. You get worse. Get help right away if: Your chest hurts. You have trouble breathing in enough air. You have an uncomfortable feeling in your back, arms, or  stomach. You have trouble talking. One side of your body feels weak. A part of your face is hanging down. These symptoms may be an emergency. Get help right away. Call your local emergency services (911 in the [place] Do not wait to see if the symptoms will go away. Do not drive yourself to the hospital. Summary This  condition affects breathing during sleep. The most common cause is a collapsed or blocked airway. The goal of treatment is to help you breathe normally while you sleep. This information is not intended to replace advice given to you by your health care provider. Make sure you discuss any questions you have with your health care provider. Document Revised: 11/14/2020 Document Reviewed: 03/16/2020 Elsevier Patient Education  2022 [employer].

## 2021-05-22 ENCOUNTER — Other Ambulatory Visit: Payer: Self-pay | Admitting: Family Medicine

## 2021-05-22 ENCOUNTER — Encounter: Payer: Self-pay | Admitting: Family Medicine

## 2021-05-22 ENCOUNTER — Ambulatory Visit (INDEPENDENT_AMBULATORY_CARE_PROVIDER_SITE_OTHER): Payer: Medicare Other | Admitting: Family Medicine

## 2021-05-22 VITALS — BP 140/85 | HR 87 | Temp 97.9°F | Ht 69.0 in | Wt 293.0 lb

## 2021-05-22 DIAGNOSIS — I10 Essential (primary) hypertension: Secondary | ICD-10-CM

## 2021-05-22 DIAGNOSIS — E11621 Type 2 diabetes mellitus with foot ulcer: Secondary | ICD-10-CM

## 2021-05-22 DIAGNOSIS — L97529 Non-pressure chronic ulcer of other part of left foot with unspecified severity: Secondary | ICD-10-CM

## 2021-05-22 DIAGNOSIS — E1165 Type 2 diabetes mellitus with hyperglycemia: Secondary | ICD-10-CM

## 2021-05-22 DIAGNOSIS — R700 Elevated erythrocyte sedimentation rate: Secondary | ICD-10-CM

## 2021-05-22 DIAGNOSIS — E876 Hypokalemia: Secondary | ICD-10-CM

## 2021-05-22 DIAGNOSIS — I5032 Chronic diastolic (congestive) heart failure: Secondary | ICD-10-CM

## 2021-05-22 DIAGNOSIS — Z1211 Encounter for screening for malignant neoplasm of colon: Secondary | ICD-10-CM

## 2021-05-22 LAB — COMPREHENSIVE METABOLIC PANEL
ALT: 9 U/L (ref 0–53)
AST: 10 U/L (ref 0–37)
Albumin: 4.1 g/dL (ref 3.5–5.2)
Alkaline Phosphatase: 91 U/L (ref 39–117)
BUN: 39 mg/dL — ABNORMAL HIGH (ref 6–23)
CO2: 35 mEq/L — ABNORMAL HIGH (ref 19–32)
Calcium: 8.9 mg/dL (ref 8.4–10.5)
Chloride: 94 mEq/L — ABNORMAL LOW (ref 96–112)
Creatinine, Ser: 1.53 mg/dL — ABNORMAL HIGH (ref 0.40–1.50)
GFR: 50.01 mL/min — ABNORMAL LOW (ref >60.00)
Glucose, Bld: 142 mg/dL — ABNORMAL HIGH (ref 70–99)
Potassium: 3.4 mEq/L — ABNORMAL LOW (ref 3.5–5.1)
Sodium: 138 mEq/L (ref 135–145)
Total Bilirubin: 0.5 mg/dL (ref 0.2–1.2)
Total Protein: 7.5 g/dL (ref 6.0–8.3)

## 2021-05-22 LAB — SEDIMENTATION RATE: Sed Rate: 70 mm/hr — ABNORMAL HIGH (ref 0–20)

## 2021-05-22 LAB — LIPID PANEL
Cholesterol: 129 mg/dL (ref 0–200)
HDL: 35 mg/dL — ABNORMAL LOW (ref >39.00)
LDL Cholesterol: 58 mg/dL (ref 0–99)
NonHDL: 94.05
Total CHOL/HDL Ratio: 4
Triglycerides: 182 mg/dL — ABNORMAL HIGH (ref 0.0–149.0)
VLDL: 36.4 mg/dL (ref 0.0–40.0)

## 2021-05-22 LAB — HEMOGLOBIN A1C: Hgb A1c MFr Bld: 7.1 % — ABNORMAL HIGH (ref 4.6–6.5)

## 2021-05-22 MED ORDER — DOXYCYCLINE HYCLATE 100 MG PO TABS: 100.0000 mg | ORAL_TABLET | Freq: Two times a day (BID) | ORAL | 0 refills | Status: AC

## 2021-05-22 MED ORDER — METHOCARBAMOL 500 MG PO TABS
500.0000 mg | ORAL_TABLET | Freq: Two times a day (BID) | ORAL | 2 refills | Status: DC
Start: 2021-05-22 — End: 2021-08-19

## 2021-05-22 MED ORDER — GABAPENTIN 300 MG PO CAPS: 300.0000 mg | ORAL_CAPSULE | Freq: Three times a day (TID) | ORAL | 3 refills | Status: DC

## 2021-05-22 NOTE — Addendum Note (Signed)
Addended by: Ames Coupe on: 05/22/2021 12:05 PM   Modules accepted: Level of Service

## 2021-05-22 NOTE — Progress Notes (Signed)
Bmp
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
## 2021-05-22 NOTE — Patient Instructions (Signed)
Give [place] 2-3 business days to get the results of your labs back.   Keep the ulcer clean and dry.   If you do not hear anything about your referral in the next 1-2 weeks, call our office and ask for an update.  Let us know if you need anything.

## 2021-05-23 ENCOUNTER — Telehealth: Payer: Self-pay | Admitting: Family Medicine

## 2021-05-23 DIAGNOSIS — L97529 Non-pressure chronic ulcer of other part of left foot with unspecified severity: Secondary | ICD-10-CM

## 2021-05-23 DIAGNOSIS — E11621 Type 2 diabetes mellitus with foot ulcer: Secondary | ICD-10-CM

## 2021-05-23 DIAGNOSIS — R700 Elevated erythrocyte sedimentation rate: Secondary | ICD-10-CM

## 2021-05-23 NOTE — Telephone Encounter (Signed)
Orders changed and faxed downstairs

## 2021-05-23 NOTE — Telephone Encounter (Signed)
Imaging downstairs stated the mri for the left foot has been put in incorrectly. He stated they always do an mri w contrast and w/o to compare. He will need this fixed as soon as possible as they are trying to get him seen Saturday.They would like this faxed to [phone]

## 2021-05-25 ENCOUNTER — Ambulatory Visit (HOSPITAL_BASED_OUTPATIENT_CLINIC_OR_DEPARTMENT_OTHER): Payer: Medicare Other

## 2021-05-25 ENCOUNTER — Ambulatory Visit (HOSPITAL_BASED_OUTPATIENT_CLINIC_OR_DEPARTMENT_OTHER)
Admission: RE | Admit: 2021-05-25 | Discharge: 2021-05-25 | Disposition: A | Discharge status: Home / Self Care | Payer: Medicare Other | Source: Ambulatory Visit | Attending: Family Medicine | Admitting: Family Medicine

## 2021-05-25 ENCOUNTER — Other Ambulatory Visit: Payer: Self-pay

## 2021-05-25 DIAGNOSIS — L97529 Non-pressure chronic ulcer of other part of left foot with unspecified severity: Secondary | ICD-10-CM | POA: Insufficient documentation

## 2021-05-25 DIAGNOSIS — R700 Elevated erythrocyte sedimentation rate: Secondary | ICD-10-CM | POA: Diagnosis present

## 2021-05-25 DIAGNOSIS — E11621 Type 2 diabetes mellitus with foot ulcer: Secondary | ICD-10-CM | POA: Insufficient documentation

## 2021-05-25 MED ORDER — GADOBUTROL 1 MMOL/ML IV SOLN
10.0000 mL | Freq: Once | INTRAVENOUS | Status: AC | PRN
  Administered 2021-05-25: 10 mL via INTRAVENOUS

## 2021-05-29 ENCOUNTER — Other Ambulatory Visit: Payer: Medicare Other

## 2021-05-30 ENCOUNTER — Other Ambulatory Visit: Payer: Self-pay | Admitting: Family Medicine

## 2021-05-30 DIAGNOSIS — G2581 Restless legs syndrome: Secondary | ICD-10-CM
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
## 2021-05-31 ENCOUNTER — Telehealth: Payer: Self-pay | Admitting: Family Medicine

## 2021-05-31 MED ORDER — ROPINIROLE HCL 4 MG PO TABS: ORAL_TABLET | ORAL | 3 refills | Status: DC

## 2021-05-31 MED ORDER — OXYCODONE HCL 10 MG PO TABS: 5.0000 mg | ORAL_TABLET | Freq: Three times a day (TID) | ORAL | 0 refills | Status: DC | PRN

## 2021-05-31 MED ORDER — ATORVASTATIN CALCIUM 80 MG PO TABS: 80.0000 mg | ORAL_TABLET | Freq: Every day | ORAL | 0 refills | Status: DC

## 2021-05-31 MED ORDER — AMLODIPINE BESYLATE 5 MG PO TABS: 5.0000 mg | ORAL_TABLET | Freq: Every day | ORAL | 0 refills | Status: DC

## 2021-05-31 NOTE — Telephone Encounter (Signed)
Patient called for update on meds.
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
## 2021-05-31 NOTE — Telephone Encounter (Signed)
Called the patient and no answer//mailbox not set up yet.
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
## 2021-05-31 NOTE — Telephone Encounter (Signed)
Will send message to PCP. Patient is aware

## 2021-06-05 ENCOUNTER — Other Ambulatory Visit: Payer: Self-pay

## 2021-06-05 ENCOUNTER — Encounter (HOSPITAL_BASED_OUTPATIENT_CLINIC_OR_DEPARTMENT_OTHER): Payer: Medicare Other | Attending: Internal Medicine | Admitting: Internal Medicine

## 2021-06-05 DIAGNOSIS — M1990 Unspecified osteoarthritis, unspecified site: Secondary | ICD-10-CM | POA: Diagnosis not present

## 2021-06-05 DIAGNOSIS — I10 Essential (primary) hypertension: Secondary | ICD-10-CM | POA: Diagnosis not present

## 2021-06-05 DIAGNOSIS — G2581 Restless legs syndrome: Secondary | ICD-10-CM | POA: Diagnosis not present

## 2021-06-05 DIAGNOSIS — E11621 Type 2 diabetes mellitus with foot ulcer: Secondary | ICD-10-CM | POA: Diagnosis not present

## 2021-06-05 DIAGNOSIS — E1142 Type 2 diabetes mellitus with diabetic polyneuropathy: Secondary | ICD-10-CM | POA: Insufficient documentation

## 2021-06-05 DIAGNOSIS — G4733 Obstructive sleep apnea (adult) (pediatric): Secondary | ICD-10-CM | POA: Diagnosis not present

## 2021-06-05 DIAGNOSIS — L97528 Non-pressure chronic ulcer of other part of left foot with other specified severity: Secondary | ICD-10-CM | POA: Diagnosis not present

## 2021-06-06 NOTE — Progress Notes (Signed)
Justin Hall, Justin Hall ([id number]) Visit Report for 06/05/2021 Abuse Risk Screen Details Patient Name: Date of Service: Justin Hall, Justin Hall 06/05/2021 8:00 A M Medical Record Number: [id number] Patient Account Number: [id number] Date of Birth/Sex: Treating RN: 1965/09/23 (56 y.o. Burnadette Pop, Lauren Primary Care Kenechukwu Eckstein: Riki Sheer Other Clinician: Referring Lautaro Koral: Treating Rubbie Goostree/Extender: Louann Sjogren in Treatment: 0 Abuse Risk Screen Items Answer ABUSE RISK SCREEN: Has anyone close to you tried to hurt or harm you recentlyo No Do you feel uncomfortable with anyone in your familyo No Has anyone forced you do things that you didnt want to doo No Electronic Signature(s) Signed: 06/06/2021 5:08:54 PM By: Rhae Hammock RN Entered By: Rhae Hammock on 06/05/2021 08:13:24 -------------------------------------------------------------------------------- Activities of Daily Living Details Patient Name: Date of Service: Justin Hall, Justin Hall 06/05/2021 8:00 [place] Smith Record Number: [id number] Patient Account Number: [id number] Date of Birth/Sex: Treating RN: [birth year] (56 y.o. Burnadette Pop, Lauren Primary Care Taeden Geller: Riki Sheer Other Clinician: Referring Rhyder Koegel: Treating Jia Dottavio/Extender: Louann Sjogren in Treatment: 0 Activities of Daily Living Items Answer Activities of Daily Living (Please select one for each item) Drive Automobile Completely Able T Medications ake Completely Able Use T elephone Completely Able Care for Appearance Completely Able Use T oilet Completely Able Bath / Shower Completely Able Dress Self Completely Able Feed Self Completely Able Walk Completely Able Get In / Out Bed Completely Able Housework Completely Able Prepare Meals Completely [place] for Self Completely Able Electronic Signature(s) Signed: 06/06/2021 5:08:54 PM By: Rhae Hammock  RN Entered By: Rhae Hammock on 06/05/2021 08:13:43 -------------------------------------------------------------------------------- Education Screening Details Patient Name: Date of Service: Justin Hall, Justin Hall 06/05/2021 8:00 A M Medical Record Number: [id number] Patient Account Number: [id number] Date of Birth/Sex: Treating RN: 1965/05/24 (56 y.o. Burnadette Pop, Lauren Primary Care Kalene Cutler: Riki Sheer Other Clinician: Referring Tyreshia Ingman: Treating Lasheika Ortloff/Extender: Louann Sjogren in Treatment: 0 Primary Learner Assessed: Patient Learning Preferences/Education Level/Primary Language Learning Preference: Explanation, Demonstration, Communication Board, Printed Material Highest Education Level: College or Above Preferred Language: English Cognitive Barrier Language Barrier: No Translator Needed: No Memory Deficit: No Emotional Barrier: No Cultural/Religious Beliefs Affecting Medical Care: No Physical Barrier Impaired Vision: No Impaired Hearing: No Decreased Hand dexterity: No Knowledge/Comprehension Knowledge Level: High Comprehension Level: High Ability to understand written instructions: High Ability to understand verbal instructions: High Motivation Anxiety Level: Calm Cooperation: Cooperative Education Importance: Denies Need Interest in Health Problems: Asks Questions Perception: Coherent Willingness to Engage in Self-Management High Activities: Readiness to Engage in Self-Management High Activities: Electronic Signature(s) Signed: 06/06/2021 5:08:54 PM By: Rhae Hammock RN Entered By: Rhae Hammock on 06/05/2021 08:14:08 -------------------------------------------------------------------------------- Fall Risk Assessment Details Patient Name: Date of Service: Justin Hall, Justin Hall 06/05/2021 8:00 A M Medical Record Number: [id number] Patient Account Number: [id number] Date of Birth/Sex: Treating RN: 12-Aug-1965 (56 y.o. Burnadette Pop, Lauren Primary Care Dyasia Firestine: Riki Sheer Other Clinician: Referring Tyrie Porzio: Treating Keeley Sussman/Extender: Louann Sjogren in Treatment: 0 Fall Risk Assessment Items Have you had 2 or more falls in the last 12 monthso 0 No Have you had any fall that resulted in injury in the last 12 monthso 0 No FALLS RISK SCREEN History of falling - immediate or within 3 months 0 No Secondary diagnosis (Do you have 2 or more medical diagnoseso) 0 No Ambulatory aid None/bed rest/wheelchair/nurse 0 No Crutches/cane/walker 0 No Furniture 0 No Intravenous therapy Access/Saline/Heparin Lock 0 No Gait/Transferring Normal/ bed rest/ wheelchair 0 No Weak (short steps with or  without shuffle, stooped but able to lift head while walking, may seek 0 No support from furniture) Impaired (short steps with shuffle, may have difficulty arising from chair, head down, impaired 0 No balance) Mental Status Oriented to own ability 0 No Electronic Signature(s) Signed: 06/06/2021 5:08:54 PM By: Rhae Hammock RN Entered By: Rhae Hammock on 06/05/2021 08:14:15 -------------------------------------------------------------------------------- Foot Assessment Details Patient Name: Date of Service: Justin Hall, Justin Hall 06/05/2021 8:00 [place] Record Number: [id number] Patient Account Number: [id number] Date of Birth/Sex: Treating RN: 1965/09/14 (56 y.o. Burnadette Pop, Lauren Primary Care Faris Coolman: Riki Sheer Other Clinician: Referring Kendal Ghazarian: Treating Alisson Rozell/Extender: Louann Sjogren in Treatment: 0 Foot Assessment Items Site Locations + = Sensation present, - = Sensation absent, C = Callus, U = Ulcer R = Redness, W = Warmth, M = Maceration, PU = Pre-ulcerative lesion F = Fissure, S = Swelling, D = Dryness Assessment Right: Left: Other Deformity: No No Prior Foot Ulcer: No No Prior Amputation: No No Charcot Joint: No  No Ambulatory Status: Ambulatory Without Help Gait: Steady Electronic Signature(s) Signed: 06/06/2021 5:08:54 PM By: Rhae Hammock RN Entered By: Rhae Hammock on 06/05/2021 08:24:45 -------------------------------------------------------------------------------- Nutrition Risk Screening Details Patient Name: Date of Service: Justin Hall, Justin Hall 06/05/2021 8:00 A M Medical Record Number: [id number] Patient Account Number: [id number] Date of Birth/Sex: Treating RN: [birth year] (56 y.o. Burnadette Pop, Lauren Primary Care Latalia Etzler: Riki Sheer Other Clinician: Referring Jeanelle Dake: Treating Demorris Choyce/Extender: Louann Sjogren in Treatment: 0 Height (in): 69 Weight (lbs): 290 Body Mass Index (BMI): 42.8 Nutrition Risk Screening Items Score Screening NUTRITION RISK SCREEN: I have an illness or condition that made me change the kind and/or amount of food I eat 0 No I eat fewer than two meals per day 0 No I eat few fruits and vegetables, or milk products 0 No I have three or more drinks of beer, liquor or wine almost every day 0 No I have tooth or mouth problems that make it hard for me to eat 0 No I don't always have enough money to buy the food I need 0 No I eat alone most of the time 0 No I take three or more different prescribed or over-the-counter drugs a day 0 No Without wanting to, I have lost or gained 10 pounds in the last six months 0 No I am not always physically able to shop, cook and/or feed myself 0 No Nutrition Protocols Good Risk Protocol 0 No interventions needed Moderate Risk Protocol High Risk Proctocol Risk Level: Good Risk Score: 0 Electronic Signature(s) Signed: 06/06/2021 5:08:54 PM By: Rhae Hammock RN Entered By: Rhae Hammock on 06/05/2021 08:14:22

## 2021-06-11 LAB — AEROBIC/ANAEROBIC CULTURE W GRAM STAIN (SURGICAL/DEEP WOUND)

## 2021-06-12 ENCOUNTER — Encounter (HOSPITAL_BASED_OUTPATIENT_CLINIC_OR_DEPARTMENT_OTHER): Payer: Medicare Other | Admitting: General Surgery

## 2021-06-12 ENCOUNTER — Ambulatory Visit (HOSPITAL_BASED_OUTPATIENT_CLINIC_OR_DEPARTMENT_OTHER): Payer: Medicare Other | Attending: Pulmonary Disease | Admitting: Pulmonary Disease

## 2021-06-18 ENCOUNTER — Other Ambulatory Visit: Payer: Self-pay

## 2021-06-18 ENCOUNTER — Encounter (HOSPITAL_BASED_OUTPATIENT_CLINIC_OR_DEPARTMENT_OTHER): Payer: Medicare Other | Admitting: Internal Medicine

## 2021-06-18 DIAGNOSIS — E11621 Type 2 diabetes mellitus with foot ulcer: Secondary | ICD-10-CM | POA: Diagnosis not present

## 2021-06-18 NOTE — Progress Notes (Signed)
Justin Hall, Justin Hall ([id number]) Visit Report for 06/18/2021 Debridement Details Patient Name: Date of Service: Justin Hall, Justin Hall 06/18/2021 9:30 A M Medical Record Number: [id number] Patient Account Number: [id number] Date of Birth/Sex: Treating RN: 28-Mar-1966 (56 y.o. [name] Primary Care Provider: Riki Sheer Other Clinician: Referring Provider: Treating Provider/Extender: Kathrine Haddock in Treatment: 1 Debridement Performed for Assessment: Wound #1 Left,Lateral Foot Performed By: Physician Ricard Dillon., MD Debridement Type: Debridement Severity of Tissue Pre Debridement: Fat layer exposed Level of Consciousness (Pre-procedure): Awake and Alert Pre-procedure Verification/Time Out Yes - 10:09 Taken: Start Time: 10:09 Pain Control: Lidocaine 5% topical ointment T Area Debrided (L x W): otal 0.5 (cm) x 0.5 (cm) = 0.25 (cm) Tissue and other material debrided: Non-Viable, Callus, Eschar, Skin: Dermis , Skin: Epidermis Level: Skin/Epidermis Debridement Description: Selective/Open Wound Instrument: Curette Bleeding: Minimum Hemostasis Achieved: Pressure End Time: 10:11 Procedural Pain: 1 Post Procedural Pain: 0 Response to Treatment: Procedure was tolerated well Level of Consciousness (Post- Awake and Alert procedure): Post Debridement Measurements of Total Wound Length: (cm) 0.5 Width: (cm) 0.5 Depth: (cm) 0.4 Volume: (cm) 0.079 Character of Wound/Ulcer Post Debridement: Improved Severity of Tissue Post Debridement: Fat layer exposed Post Procedure Diagnosis Same as Pre-procedure Electronic Signature(s) Signed: 06/18/2021 4:13:40 PM By: Linton Ham MD Signed: 06/18/2021 6:17:08 PM By: Dellie Catholic RN Entered By: Linton Ham on 06/18/2021 10:16:10 -------------------------------------------------------------------------------- HPI Details Patient Name: Date of Service: Justin Hall, Justin Hall 06/18/2021 9:30 A M Medical Record  Number: [id number] Patient Account Number: [id number] Date of Birth/Sex: Treating RN: 08-02-1965 (56 y.o. [name] Primary Care Provider: Riki Sheer Other Clinician: Referring Provider: Treating Provider/Extender: Kathrine Haddock in Treatment: 1 History of Present Illness HPI Description: ADMISSION 06/05/2021 This is a 56 year old man who is a type II diabetic with peripheral neuropathy. He has been dealing with a wound on his left lateral foot for about 6 months. He thinks this started while he was hiking forming a blister however over time its also had significant callus. His wife who works for [employer] a [place] has been putting alcohol on this and a Band-Aid he comes into our clinic today with crocs on his feet. He saw his primary doctor earlier this month and was placed on 2 weeks of doxycycline. He had an MRI that showed no osteo septic joint or abscess on 05/25/2020. Past medical history includes type 2 diabetes with peripheral neuropathy, obstructive sleep apnea, hypertension and restless leg syndrome ABI in our clinic was noncompressible on the left 2/28; diabetic foot ulcer left fifth metatarsal head. A lot better this week. Culture I did last time showed enterococcus faecalis. He was on doxycycline and of course this would not cover this. I started him on Augmentin on 2/23. Using silver alginate and a forefoot offloading boot. Patient states he is being compliant with this. He is not working. Electronic Signature(s) Signed: 06/18/2021 4:13:40 PM By: Linton Ham MD Entered By: Linton Ham on 06/18/2021 10:17:23 -------------------------------------------------------------------------------- Physical Exam Details Patient Name: Date of Service: Justin Hall, Justin Hall 06/18/2021 9:30 A M Medical Record Number: [id number] Patient Account Number: [id number] Date of Birth/Sex: Treating RN: [birth year] (56 y.o. [name] Primary Care Provider: Riki Sheer Other Clinician: Referring Provider: Treating Provider/Extender: Kathrine Haddock in Treatment: 1 Respiratory work of breathing is normal. Cardiovascular the pulses are palpable on the right. Notes Exam; mostly on his right plantar foot today. Wound is smaller. Thick skin and eschar around the  wound circumference which I removed with a #3 curet. This is a linear slitlike wound. It still has some depth but I don't think much undermining. No erythema around the wound no purulent drainage is seen Electronic Signature(s) Signed: 06/18/2021 4:13:40 PM By: Linton Ham MD Entered By: Linton Ham on 06/18/2021 10:19:04 -------------------------------------------------------------------------------- Physician Orders Details Patient Name: Date of Service: Justin Hall, Justin Hall 06/18/2021 9:30 A M Medical Record Number: [id number] Patient Account Number: [id number] Date of Birth/Sex: Treating RN: [birth year] (56 y.o. [name] Primary Care Provider: Riki Sheer Other Clinician: Referring Provider: Treating Provider/Extender: Kathrine Haddock in Treatment: 1 Verbal / Phone Orders: No Diagnosis Coding ICD-10 Coding Code Description E11.621 Type 2 diabetes mellitus with foot ulcer E11.42 Type 2 diabetes mellitus with diabetic polyneuropathy L97.528 Non-pressure chronic ulcer of other part of left foot with other specified severity Follow-up Appointments ppointment in 1 week. - Dr. Reyes Ivan Return A Bathing/ Shower/ Hygiene May shower with protection but do not get wound dressing(s) wet. Edema Control - Lymphedema / SCD / Other Elevate legs to the level of the heart or above for 30 minutes daily and/or when sitting, a frequency of: Avoid standing for long periods of time. Off-Loading Wedge shoe to: - left foot Wound Treatment Wound #1 - Foot Wound Laterality: Left,  Lateral Cleanser: Soap and Water 1 x Per Day/30 Days Discharge Instructions: May shower and wash wound with dial antibacterial soap and water prior to dressing change. Cleanser: Wound Cleanser (Generic) 1 x Per Day/30 Days Discharge Instructions: Cleanse the wound with wound cleanser prior to applying a clean dressing using gauze sponges, not tissue or cotton balls. Prim Dressing: KerraCel Ag Gelling Fiber Dressing, 4x5 in (silver alginate) (Generic) 1 x Per Day/30 Days ary Discharge Instructions: Apply silver alginate to wound bed as instructed Secondary Dressing: Woven Gauze Sponge, Non-Sterile 4x4 in (Generic) 1 x Per Day/30 Days Discharge Instructions: Apply over primary dressing as directed. Secondary Dressing: Optifoam Non-Adhesive Dressing, 4x4 in 1 x Per Day/30 Days Discharge Instructions: Apply foam donut over primary dressing as directed. Secured With: [employer], 4.5x3.1 (in/yd) (Generic) 1 x Per Day/30 Days Discharge Instructions: Secure with Kerlix as directed. Secured With: 68M Medipore H Soft Cloth Surgical T ape, 4 x 10 (in/yd) (Generic) 1 x Per Day/30 Days Discharge Instructions: Secure with tape as directed. Electronic Signature(s) Signed: 06/18/2021 4:13:40 PM By: Linton Ham MD Signed: 06/18/2021 6:17:08 PM By: Dellie Catholic RN Entered By: Dellie Catholic on 06/18/2021 10:19:18 -------------------------------------------------------------------------------- Problem List Details Patient Name: Date of Service: Justin Hall, Justin Hall 06/18/2021 9:30 A M Medical Record Number: [id number] Patient Account Number: [id number] Date of Birth/Sex: Treating RN: May 01, 1965 (56 y.o. [name] Primary Care Provider: Riki Sheer Other Clinician: Referring Provider: Treating Provider/Extender: Kathrine Haddock in Treatment: 1 Active Problems ICD-10 Encounter Code Description Active Date MDM Diagnosis E11.621 Type 2 diabetes mellitus  with foot ulcer 06/05/2021 No Yes E11.42 Type 2 diabetes mellitus with diabetic polyneuropathy 06/05/2021 No Yes L97.528 Non-pressure chronic ulcer of other part of left foot with other specified 06/05/2021 No Yes severity Inactive Problems Resolved Problems Electronic Signature(s) Signed: 06/18/2021 4:13:40 PM By: Linton Ham MD Entered By: Linton Ham on 06/18/2021 10:15:09 -------------------------------------------------------------------------------- Progress Note Details Patient Name: Date of Service: Justin Hall, Justin Hall 06/18/2021 9:30 A M Medical Record Number: [id number] Patient Account Number: [id number] Date of Birth/Sex: Treating RN: 1966-04-05 (56 y.o. [name] Primary Care Provider: Riki Sheer Other Clinician: Referring Provider: Treating Provider/Extender: Dellia Nims  Arlis Porta, Hart Carwin Weeks in Treatment: 1 Subjective History of Present Illness (HPI) ADMISSION 06/05/2021 This is a 56 year old man who is a type II diabetic with peripheral neuropathy. He has been dealing with a wound on his left lateral foot for about 6 months. He thinks this started while he was hiking forming a blister however over time its also had significant callus. His wife who works for [employer] a [place] has been putting alcohol on this and a Band-Aid he comes into our clinic today with crocs on his feet. He saw his primary doctor earlier this month and was placed on 2 weeks of doxycycline. He had an MRI that showed no osteo septic joint or abscess on 05/25/2020. Past medical history includes type 2 diabetes with peripheral neuropathy, obstructive sleep apnea, hypertension and restless leg syndrome ABI in our clinic was noncompressible on the left 2/28; diabetic foot ulcer left fifth metatarsal head. A lot better this week. Culture I did last time showed enterococcus faecalis. He was on doxycycline and of course this would not cover this. I started him on  Augmentin on 2/23. Using silver alginate and a forefoot offloading boot. Patient states he is being compliant with this. He is not working. Objective Constitutional Vitals Time Taken: 9:45 AM, Height: 69 in, Weight: 290 lbs, BMI: 42.8, Temperature: 97.6 F, Pulse: 73 bpm, Respiratory Rate: 18 breaths/min, Blood Pressure: 188/98 mmHg. Respiratory work of breathing is normal. Cardiovascular the pulses are palpable on the right. General Notes: Exam; mostly on his right plantar foot today. Wound is smaller. Thick skin and eschar around the wound circumference which I removed with a #3 curet. This is a linear slitlike wound. It still has some depth but I don't think much undermining. No erythema around the wound no purulent drainage is seen Integumentary (Hair, Skin) Wound #1 status is Open. Original cause of wound was Blister. The date acquired was: 12/20/2020. The wound has been in treatment 1 weeks. The wound is located on the Left,Lateral Foot. The wound measures 0.5cm length x 0.5cm width x 0.4cm depth; 0.196cm^2 area and 0.079cm^3 volume. There is Fat Layer (Subcutaneous Tissue) exposed. There is no tunneling noted, however, there is undermining starting at 2:00 and ending at 6:00 with a maximum distance of 0.6cm. There is a medium amount of serosanguineous drainage noted. The wound margin is distinct with the outline attached to the wound base. There is medium (34-66%) red, pink granulation within the wound bed. There is a medium (34-66%) amount of necrotic tissue within the wound bed including Adherent Slough. Assessment Active Problems ICD-10 Type 2 diabetes mellitus with foot ulcer Type 2 diabetes mellitus with diabetic polyneuropathy Non-pressure chronic ulcer of other part of left foot with other specified severity Procedures Wound #1 Pre-procedure diagnosis of Wound #1 is a Diabetic Wound/Ulcer of the Lower Extremity located on the Left,Lateral Foot .Severity of Tissue Pre  Debridement is: Fat layer exposed. There was a Selective/Open Wound Skin/Epidermis Debridement with a total area of 0.25 sq cm performed by Ricard Dillon., MD. With the following instrument(s): Curette to remove Non-Viable tissue/material. Material removed includes Eschar, Callus, Skin: Dermis, and Skin: Epidermis after achieving pain control using Lidocaine 5% topical ointment. No specimens were taken. A time out was conducted at 10:09, prior to the start of the procedure. A Minimum amount of bleeding was controlled with Pressure. The procedure was tolerated well with a pain level of 1 throughout and a pain level of 0 following the procedure. Post  Debridement Measurements: 0.5cm length x 0.5cm width x 0.4cm depth; 0.079cm^3 volume. Character of Wound/Ulcer Post Debridement is improved. Severity of Tissue Post Debridement is: Fat layer exposed. Post procedure Diagnosis Wound #1: Same as Pre-Procedure Plan Follow-up Appointments: Return Appointment in 1 week. - Dr. Reyes Ivan Bathing/ Shower/ Hygiene: May shower with protection but do not get wound dressing(s) wet. Edema Control - Lymphedema / SCD / Other: Elevate legs to the level of the heart or above for 30 minutes daily and/or when sitting, a frequency of: Avoid standing for long periods of time. Off-Loading: Wedge shoe to: - left foot WOUND #1: - Foot Wound Laterality: Left, Lateral Cleanser: Soap and Water 1 x Per Day/30 Days Discharge Instructions: May shower and wash wound with dial antibacterial soap and water prior to dressing change. Cleanser: Wound Cleanser (Generic) 1 x Per Day/30 Days Discharge Instructions: Cleanse the wound with wound cleanser prior to applying a clean dressing using gauze sponges, not tissue or cotton balls. Prim Dressing: KerraCel Ag Gelling Fiber Dressing, 4x5 in (silver alginate) (Generic) 1 x Per Day/30 Days ary Discharge Instructions: Apply silver alginate to wound bed as instructed Secondary  Dressing: Woven Gauze Sponge, Non-Sterile 4x4 in (Generic) 1 x Per Day/30 Days Discharge Instructions: Apply over primary dressing as directed. Secondary Dressing: Optifoam Non-Adhesive Dressing, 4x4 in 1 x Per Day/30 Days Discharge Instructions: Apply foam donut over primary dressing as directed. Secured With: [employer], 4.5x3.1 (in/yd) (Generic) 1 x Per Day/30 Days Discharge Instructions: Secure with Kerlix as directed. Secured With: 52M Medipore H Soft Cloth Surgical T ape, 4 x 10 (in/yd) (Generic) 1 x Per Day/30 Days Discharge Instructions: Secure with tape as directed. #1 I continued with the silver alginate as the primary dressing,gauze and foam. Wife is changing the dressing #2 wound is smaller #3 the patient culture moderate Enterococcus faecalis he is on Augmentin which we prescribed. This would not of been covered by the doxycycline he was on #4 the patient had an MRI that did not show osteomyelitis at the beginning of this month Electronic Signature(s) Signed: 06/18/2021 4:13:40 PM By: Linton Ham MD Entered By: Linton Ham on 06/18/2021 10:21:53 -------------------------------------------------------------------------------- SuperBill Details Patient Name: Date of Service: Justin Hall, Justin Hall 06/18/2021 Medical Record Number: [id number] Patient Account Number: [id number] Date of Birth/Sex: Treating RN: 1965/12/22 (56 y.o. [name] Primary Care Provider: Riki Sheer Other Clinician: Referring Provider: Treating Provider/Extender: Kathrine Haddock in Treatment: 1 Diagnosis Coding ICD-10 Codes Code Description [phone] Type 2 diabetes mellitus with foot ulcer E11.42 Type 2 diabetes mellitus with diabetic polyneuropathy L97.528 Non-pressure chronic ulcer of other part of left foot with other specified severity Facility Procedures CPT4 Code: [id number] Description: 49449 - DEBRIDE WOUND 1ST 20 SQ CM OR < ICD-10 Diagnosis  Description E11.621 Type 2 diabetes mellitus with foot ulcer Modifier: Quantity: 1 Physician Procedures : CPT4 Code Description Modifier [id number] - WC PHYS DEBR WO ANESTH 20 SQ CM ICD-10 Diagnosis Description E11.621 Type 2 diabetes mellitus with foot ulcer Quantity: 1 Electronic Signature(s) Signed: 06/18/2021 4:13:40 PM By: Linton Ham MD Entered By: Linton Ham on 06/18/2021 10:22:05

## 2021-06-18 NOTE — Progress Notes (Signed)
ACIE, CUSTIS ([id number]) Visit Report for 06/18/2021 Arrival Information Details Patient Name: Date of Service: Justin Hall, Justin Hall 06/18/2021 9:30 A M Medical Record Number: [id number] Patient Account Number: [id number] Date of Birth/Sex: Treating RN: [birth year] (56 y.o. [name] Primary Care Demari Kropp: Riki Sheer Other Clinician: Referring Pia Jedlicka: Treating Nydia Ytuarte/Extender: Kathrine Haddock in Treatment: 1 Visit Information History Since Last Visit Added or deleted any medications: No Patient Arrived: Ambulatory Any new allergies or adverse reactions: No Arrival Time: 09:45 Had a fall or experienced change in No Accompanied By: self activities of daily living that may affect Transfer Assistance: None risk of falls: Patient Requires Transmission-Based Precautions: No Signs or symptoms of abuse/neglect since last visito No Patient Has Alerts: No Hospitalized since last visit: No Implantable device outside of the clinic excluding No cellular tissue based products placed in the center since last visit: Has Dressing in Place as Prescribed: Yes Pain Present Now: No Electronic Signature(s) Signed: 06/18/2021 6:17:08 PM By: Dellie Catholic RN Entered By: Dellie Catholic on 06/18/2021 09:46:13 -------------------------------------------------------------------------------- Encounter Discharge Information Details Patient Name: Date of Service: Justin Hall, Justin Hall 06/18/2021 9:30 A M Medical Record Number: [id number] Patient Account Number: [id number] Date of Birth/Sex: Treating RN: [birth year] (55 y.o. [name] Primary Care Laury Huizar: Riki Sheer Other Clinician: Referring Chanice Brenton: Treating Racquelle Hyser/Extender: Kathrine Haddock in Treatment: 1 Encounter Discharge Information Items Post Procedure Vitals Discharge Condition: Stable Temperature (F): 97.6 Ambulatory Status: Ambulatory Pulse (bpm): 73 Discharge  Destination: Home Respiratory Rate (breaths/min): 18 Transportation: Private Auto Blood Pressure (mmHg): 188/98 Accompanied By: self Schedule Follow-up Appointment: Yes Clinical Summary of Care: Patient Declined Electronic Signature(s) Signed: 06/18/2021 6:17:08 PM By: Dellie Catholic RN Entered By: Dellie Catholic on 06/18/2021 18:15:55 -------------------------------------------------------------------------------- Lower Extremity Assessment Details Patient Name: Date of Service: Justin Hall, Justin Hall 06/18/2021 9:30 A M Medical Record Number: [id number] Patient Account Number: [id number] Date of Birth/Sex: Treating RN: 1965/10/02 (56 y.o. [name] Primary Care Kareem Aul: Riki Sheer Other Clinician: Referring Joffre Lucks: Treating Lisa-Marie Rueger/Extender: Kathrine Haddock in Treatment: 1 Edema Assessment Assessed: Shirlyn Goltz: No] Patrice Paradise: No] Edema: [Left: Ye] [Right: s] Calf Left: Right: Point of Measurement: 37 cm From Medial Instep 43.5 cm Ankle Left: Right: Point of Measurement: 12 cm From Medial Instep 27.5 cm Electronic Signature(s) Signed: 06/18/2021 6:17:08 PM By: Dellie Catholic RN Entered By: Dellie Catholic on 06/18/2021 09:48:13 -------------------------------------------------------------------------------- Multi Wound Chart Details Patient Name: Date of Service: Justin Hall, Justin Hall 06/18/2021 9:30 A M Medical Record Number: [id number] Patient Account Number: [id number] Date of Birth/Sex: Treating RN: July 30, 1965 (56 y.o. [name] Primary Care Nylene Inlow: Riki Sheer Other Clinician: Referring Tamanika Heiney: Treating Prabhleen Montemayor/Extender: Kathrine Haddock in Treatment: 1 Vital Signs Height(in): 69 Pulse(bpm): 11 Weight(lbs): 290 Blood Pressure(mmHg): 188/98 Body Mass Index(BMI): 42.8 Temperature(F): 97.6 Respiratory Rate(breaths/min): 18 Photos: [N/A:N/A] Left, Lateral Foot N/A N/A Wound  Location: Blister N/A N/A Wounding Event: Diabetic Wound/Ulcer of the Lower N/A N/A Primary Etiology: Extremity Congestive Heart Failure, N/A N/A Comorbid History: Hypertension, Type II Diabetes, Osteoarthritis 12/20/2020 N/A N/A Date Acquired: 1 N/A N/A Weeks of Treatment: Open N/A N/A Wound Status: No N/A N/A Wound Recurrence: 0.5x0.5x0.4 N/A N/A Measurements L x W x D (cm) 0.196 N/A N/A A (cm) : rea 0.079 N/A N/A Volume (cm) : 0.00% N/A N/A % Reduction in A rea: 19.40% N/A N/A % Reduction in Volume: 2 Starting Position 1 (o'clock): 6 Ending Position 1 (o'clock): 0.6 Maximum Distance 1 (cm): Yes N/A N/A Undermining: Grade 2  N/A N/A Classification: Medium N/A N/A Exudate A mount: Serosanguineous N/A N/A Exudate Type: red, brown N/A N/A Exudate Color: Distinct, outline attached N/A N/A Wound Margin: Medium (34-66%) N/A N/A Granulation A mount: Red, Pink N/A N/A Granulation Quality: Medium (34-66%) N/A N/A Necrotic A mount: Fat Layer (Subcutaneous Tissue): Yes N/A N/A Exposed Structures: Fascia: No Tendon: No Muscle: No Joint: No Bone: No None N/A N/A Epithelialization: Debridement - Selective/Open Wound N/A N/A Debridement: Pre-procedure Verification/Time Out 10:09 N/A N/A Taken: Lidocaine 5% topical ointment N/A N/A Pain Control: Callus N/A N/A Tissue Debrided: Skin/Epidermis N/A N/A Level: 0.25 N/A N/A Debridement A (sq cm): rea Curette N/A N/A Instrument: Minimum N/A N/A Bleeding: Pressure N/A N/A Hemostasis A chieved: 1 N/A N/A Procedural Pain: 0 N/A N/A Post Procedural Pain: Procedure was tolerated well N/A N/A Debridement Treatment Response: 0.5x0.5x0.4 N/A N/A Post Debridement Measurements L x W x D (cm) 0.079 N/A N/A Post Debridement Volume: (cm) Debridement N/A N/A Procedures Performed: Treatment Notes Electronic Signature(s) Signed: 06/18/2021 4:13:40 PM By: Linton Ham MD Signed: 06/18/2021 6:17:08 PM By:  Dellie Catholic RN Entered By: Linton Ham on 06/18/2021 10:15:47 -------------------------------------------------------------------------------- Multi-Disciplinary Care Plan Details Patient Name: Date of Service: Justin Hall, Justin Hall 06/18/2021 9:30 A M Medical Record Number: [id number] Patient Account Number: [id number] Date of Birth/Sex: Treating RN: 1965-06-14 (56 y.o. [name] Primary Care Marilyn Nihiser: Riki Sheer Other Clinician: Referring Ellwyn Ergle: Treating Izik Bingman/Extender: Kathrine Haddock in Treatment: 1 Active Inactive Orientation to the Wound Care Program Nursing Diagnoses: Knowledge deficit related to the wound healing center program Goals: Patient/caregiver will verbalize understanding of the [place] Date Initiated: 06/05/2021 Target Resolution Date: 06/29/2021 Goal Status: Active Interventions: Provide education on orientation to the wound center Notes: Wound/Skin Impairment Nursing Diagnoses: Impaired tissue integrity Knowledge deficit related to ulceration/compromised skin integrity Goals: Patient will have a decrease in wound volume by X% from date: (specify in notes) Date Initiated: 06/05/2021 Target Resolution Date: 06/28/2021 Goal Status: Active Patient/caregiver will verbalize understanding of skin care regimen Date Initiated: 06/05/2021 Target Resolution Date: 06/28/2021 Goal Status: Active Ulcer/skin breakdown will have a volume reduction of 30% by week 4 Date Initiated: 06/05/2021 Target Resolution Date: 06/28/2021 Goal Status: Active Interventions: Assess patient/caregiver ability to obtain necessary supplies Assess patient/caregiver ability to perform ulcer/skin care regimen upon admission and as needed Assess ulceration(s) every visit Notes: Electronic Signature(s) Signed: 06/18/2021 6:17:08 PM By: Dellie Catholic RN Entered By: Dellie Catholic on 06/18/2021  18:12:42 -------------------------------------------------------------------------------- Pain Assessment Details Patient Name: Date of Service: Justin Hall, Justin Hall 06/18/2021 9:30 A M Medical Record Number: [id number] Patient Account Number: [id number] Date of Birth/Sex: Treating RN: 1965/09/30 (56 y.o. [name] Primary Care Ludmilla Mcgillis: Riki Sheer Other Clinician: Referring Nirvan Laban: Treating Shenicka Sunderlin/Extender: Kathrine Haddock in Treatment: 1 Active Problems Location of Pain Severity and Description of Pain Patient Has Paino No Site Locations Pain Management and Medication Current Pain Management: Electronic Signature(s) Signed: 06/18/2021 6:17:08 PM By: Dellie Catholic RN Entered By: Dellie Catholic on 06/18/2021 09:47:20 -------------------------------------------------------------------------------- Patient/Caregiver Education Details Patient Name: Date of Service: Justin Hall, Justin Hall 2/28/2023andnbsp9:30 [place] Record Number: [id number] Patient Account Number: [id number] Date of Birth/Gender: Treating RN: Jan 04, 1966 (56 y.o. [name] Primary Care Physician: Riki Sheer Other Clinician: Referring Physician: Treating Physician/Extender: Kathrine Haddock in Treatment: 1 Education Assessment Education Provided To: Patient Education Topics Provided Welcome T [facility] [place]: o Methods: Explain/Verbal Responses: Return demonstration correctly Electronic Signature(s) Signed: 06/18/2021 6:17:08 PM By:  Dellie Catholic RN Entered By: Dellie Catholic on 06/18/2021 18:12:58 -------------------------------------------------------------------------------- Wound Assessment Details Patient Name: Date of Service: Justin Hall, Justin Hall 06/18/2021 9:30 A M Medical Record Number: [id number] Patient Account Number: [id number] Date of Birth/Sex: Treating RN: 08-Aug-1965 (56 y.o. [name] Primary Care  Jaymond Waage: Riki Sheer Other Clinician: Referring Chez Bulnes: Treating Lucette Kratz/Extender: Kathrine Haddock in Treatment: 1 Wound Status Wound Number: 1 Primary Diabetic Wound/Ulcer of the Lower Extremity Etiology: Wound Location: Left, Lateral Foot Wound Status: Open Wounding Event: Blister Comorbid Congestive Heart Failure, Hypertension, Type II Diabetes, Date Acquired: 12/20/2020 History: Osteoarthritis Weeks Of Treatment: 1 Clustered Wound: No Photos Wound Measurements Length: (cm) 0.5 Width: (cm) 0.5 Depth: (cm) 0.4 Area: (cm) 0.196 Volume: (cm) 0.079 % Reduction in Area: 0% % Reduction in Volume: 19.4% Epithelialization: None Tunneling: No Undermining: Yes Starting Position (o'clock): 2 Ending Position (o'clock): 6 Maximum Distance: (cm) 0.6 Wound Description Classification: Grade 2 Wound Margin: Distinct, outline attached Exudate Amount: Medium Exudate Type: Serosanguineous Exudate Color: red, brown Foul Odor After Cleansing: No Slough/Fibrino Yes Wound Bed Granulation Amount: Medium (34-66%) Exposed Structure Granulation Quality: Red, Pink Fascia Exposed: No Necrotic Amount: Medium (34-66%) Fat Layer (Subcutaneous Tissue) Exposed: Yes Necrotic Quality: Adherent Slough Tendon Exposed: No Muscle Exposed: No Joint Exposed: No Bone Exposed: No Treatment Notes Wound #1 (Foot) Wound Laterality: Left, Lateral Cleanser Soap and Water Discharge Instruction: May shower and wash wound with dial antibacterial soap and water prior to dressing change. Wound Cleanser Discharge Instruction: Cleanse the wound with wound cleanser prior to applying a clean dressing using gauze sponges, not tissue or cotton balls. Peri-Wound Care Topical Primary Dressing KerraCel Ag Gelling Fiber Dressing, 4x5 in (silver alginate) Discharge Instruction: Apply silver alginate to wound bed as instructed Secondary Dressing Woven Gauze Sponge, Non-Sterile 4x4  in Discharge Instruction: Apply over primary dressing as directed. Optifoam Non-Adhesive Dressing, 4x4 in Discharge Instruction: Apply foam donut over primary dressing as directed. Secured With [employer], 4.5x3.1 (in/yd) Discharge Instruction: Secure with Kerlix as directed. 19M Medipore H Soft Cloth Surgical T ape, 4 x 10 (in/yd) Discharge Instruction: Secure with tape as directed. Compression Wrap Compression Stockings Add-Ons Electronic Signature(s) Signed: 06/18/2021 6:17:08 PM By: Dellie Catholic RN Entered By: Dellie Catholic on 06/18/2021 10:01:14 -------------------------------------------------------------------------------- Vitals Details Patient Name: Date of Service: Justin Hall, Justin Hall 06/18/2021 9:30 A M Medical Record Number: [id number] Patient Account Number: [id number] Date of Birth/Sex: Treating RN: 1965-07-30 (56 y.o. [name] Primary Care Taitum Menton: Riki Sheer Other Clinician: Referring Lennex Pietila: Treating Lan Mcneill/Extender: Kathrine Haddock in Treatment: 1 Vital Signs Time Taken: 09:45 Temperature (F): 97.6 Height (in): 69 Pulse (bpm): 73 Weight (lbs): 290 Respiratory Rate (breaths/min): 18 Body Mass Index (BMI): 42.8 Blood Pressure (mmHg): 188/98 Reference Range: 80 - 120 mg / dl Electronic Signature(s) Signed: 06/18/2021 6:17:08 PM By: Dellie Catholic RN Entered By: Dellie Catholic on 06/18/2021 10:01:58

## 2021-06-24 ENCOUNTER — Other Ambulatory Visit: Payer: Self-pay | Admitting: Family Medicine

## 2021-06-24 ENCOUNTER — Encounter (HOSPITAL_BASED_OUTPATIENT_CLINIC_OR_DEPARTMENT_OTHER): Payer: Medicare Other | Attending: General Surgery | Admitting: General Surgery

## 2021-06-24 DIAGNOSIS — M1A9XX0 Chronic gout, unspecified, without tophus (tophi): Secondary | ICD-10-CM

## 2021-06-25 MED ORDER — ALLOPURINOL 300 MG PO TABS: 300.0000 mg | ORAL_TABLET | Freq: Two times a day (BID) | ORAL | 0 refills | Status: DC

## 2021-06-25 MED ORDER — FUROSEMIDE 40 MG PO TABS: 40.0000 mg | ORAL_TABLET | Freq: Two times a day (BID) | ORAL | 0 refills | Status: DC

## 2021-06-25 MED ORDER — ATORVASTATIN CALCIUM 80 MG PO TABS: 80.0000 mg | ORAL_TABLET | Freq: Every day | ORAL | 0 refills | Status: DC

## 2021-06-26 ENCOUNTER — Other Ambulatory Visit: Payer: Self-pay | Admitting: Family Medicine

## 2021-06-26 MED ORDER — OXYCODONE HCL 10 MG PO TABS: 5.0000 mg | ORAL_TABLET | Freq: Three times a day (TID) | ORAL | 0 refills | Status: DC | PRN

## 2021-06-26 NOTE — Telephone Encounter (Signed)
Last OV--05/22/2021 ?Last RF--#75 no refills on 05/31/2021 ?[place] on 4/24/20219 ?

## 2021-07-11 ENCOUNTER — Other Ambulatory Visit: Payer: Self-pay | Admitting: Family Medicine

## 2021-07-11 DIAGNOSIS — G2581 Restless legs syndrome: Secondary | ICD-10-CM

## 2021-07-13 ENCOUNTER — Other Ambulatory Visit: Payer: Self-pay | Admitting: Family Medicine

## 2021-07-23 ENCOUNTER — Other Ambulatory Visit: Payer: Self-pay | Admitting: Family Medicine

## 2021-07-23 DIAGNOSIS — M1A9XX0 Chronic gout, unspecified, without tophus (tophi): Secondary | ICD-10-CM

## 2021-07-24 ENCOUNTER — Other Ambulatory Visit: Payer: Self-pay | Admitting: Family Medicine

## 2021-07-24 MED ORDER — OXYCODONE HCL 10 MG PO TABS: 5.0000 mg | ORAL_TABLET | Freq: Three times a day (TID) | ORAL | 0 refills | Status: DC | PRN

## 2021-07-24 MED ORDER — OXYCODONE HCL 10 MG PO TABS: 5.0000 mg | ORAL_TABLET | Freq: Four times a day (QID) | ORAL | 0 refills | Status: DC | PRN

## 2021-07-24 NOTE — Telephone Encounter (Signed)
Last OV--05/22/2021 ?Last RF---#75 no refills on 06/26/2021 ?

## 2021-08-01 ENCOUNTER — Other Ambulatory Visit: Payer: Self-pay

## 2021-08-01 ENCOUNTER — Encounter (HOSPITAL_BASED_OUTPATIENT_CLINIC_OR_DEPARTMENT_OTHER): Payer: Self-pay | Admitting: Emergency Medicine

## 2021-08-01 ENCOUNTER — Emergency Department (HOSPITAL_BASED_OUTPATIENT_CLINIC_OR_DEPARTMENT_OTHER)
Admission: EM | Admit: 2021-08-01 | Discharge: 2021-08-01 | Disposition: A | Discharge status: Home / Self Care | Payer: Medicare Other | Attending: Emergency Medicine | Admitting: Emergency Medicine

## 2021-08-01 DIAGNOSIS — Z79899 Other long term (current) drug therapy: Secondary | ICD-10-CM | POA: Diagnosis not present

## 2021-08-01 DIAGNOSIS — W57XXXA Bitten or stung by nonvenomous insect and other nonvenomous arthropods, initial encounter: Secondary | ICD-10-CM | POA: Insufficient documentation

## 2021-08-01 DIAGNOSIS — S60862A Insect bite (nonvenomous) of left wrist, initial encounter: Secondary | ICD-10-CM | POA: Insufficient documentation

## 2021-08-01 DIAGNOSIS — Z7984 Long term (current) use of oral hypoglycemic drugs: Secondary | ICD-10-CM | POA: Diagnosis not present

## 2021-08-01 DIAGNOSIS — I2510 Atherosclerotic heart disease of native coronary artery without angina pectoris: Secondary | ICD-10-CM | POA: Insufficient documentation

## 2021-08-01 DIAGNOSIS — E119 Type 2 diabetes mellitus without complications: Secondary | ICD-10-CM | POA: Diagnosis not present

## 2021-08-01 DIAGNOSIS — I110 Hypertensive heart disease with heart failure: Secondary | ICD-10-CM | POA: Diagnosis not present

## 2021-08-01 DIAGNOSIS — Y9289 Other specified places as the place of occurrence of the external cause: Secondary | ICD-10-CM | POA: Diagnosis not present

## 2021-08-01 DIAGNOSIS — I509 Heart failure, unspecified: Secondary | ICD-10-CM | POA: Diagnosis not present

## 2021-08-01 MED ORDER — CEPHALEXIN 500 MG PO CAPS: 500.0000 mg | ORAL_CAPSULE | Freq: Four times a day (QID) | ORAL | 0 refills | Status: AC

## 2021-08-01 NOTE — ED Notes (Signed)
Pt. Has a red round wound noted on the L top side of his wrist with a open fistula in the center.  Pt. In no distress.  Reporting this happened last night from a "spider bite".  Pt. In reports tenderness and feels numbness and pain in the L hand. ?

## 2021-08-01 NOTE — Discharge Instructions (Addendum)
Please pick up prescription from pharmacy. Keep area dry and clean. If you notice worsening symptoms, please return to the ED.  ?

## 2021-08-01 NOTE — ED Triage Notes (Signed)
Pt here with what he thinks in a spider bite that happened last night in his garage , pt has a small red circle are to the left wrist  ?

## 2021-08-01 NOTE — ED Provider Notes (Signed)
?[facility] EMERGENCY DEPARTMENT ?Provider Note ? ? ?CSN: [id number] ?Arrival date & time: 08/01/21  1346 ? ?  ? ?History ?PMH: CAD, HTN, DM, CHF ?Chief Complaint  ?Patient presents with  ? Insect Bite  ? ? ?Justin Hall is a 56 y.o. male. ?Presents the emergency department with chief complaint of an insect bite on his left wrist.  He states this happened 2 days ago.  He did not actually see the spider, but he said his friend died.  They think it was a brown recluse.  He has had a erythematous small bump on it that is slightly enlarged.  No other systemic symptoms noted.  No fevers or chills.  Patient has history of diabetes and has had history of staph infection. ?HPI ? ?  ? ?Home Medications ?Prior to Admission medications   ?Medication Sig Start Date End Date Taking? Authorizing Provider  ?cephALEXin (KEFLEX) 500 MG capsule Take 1 capsule (500 mg total) by mouth 4 (four) times daily for 7 days. 08/01/21 08/08/21 Yes Nayla Dias, Adora Fridge, PA-C  ?albuterol (VENTOLIN HFA) 108 (90 Base) MCG/ACT inhaler Inhale 2 puffs into the lungs every 6 (six) hours as needed for wheezing or shortness of breath. 02/01/21   [name], DO  ?allopurinol (ZYLOPRIM) 300 MG tablet TAKE 1 TABLET(300 MG) BY MOUTH TWICE DAILY 07/23/21   [name], DO  ?amLODipine (NORVASC) 5 MG tablet TAKE 1 TABLET (5 MG TOTAL) BY MOUTH DAILY. 07/15/21   [name], DO  ?atorvastatin (LIPITOR) 80 MG tablet Take 1 tablet (80 mg total) by mouth daily. 06/25/21   [name], DO  ?blood glucose meter kit and supplies Dispense based on patient and insurance preference. Use up to four times daily as directed. (FOR ICD-10 E10.9, E11.9). 10/21/18   Lavina Hamman, MD  ?carvedilol (COREG) 6.25 MG tablet TAKE 1 TABLET(6.25 MG) BY MOUTH TWICE DAILY 05/21/21   [name], DO  ?chlorthalidone (HYGROTON) 25 MG tablet Take 1 tablet (25 mg total) by mouth daily. 02/11/21   [name], DO   ?furosemide (LASIX) 40 MG tablet TAKE 1 TABLET(40 MG) BY MOUTH TWICE DAILY 07/23/21   [name], DO  ?gabapentin (NEURONTIN) 300 MG capsule Take 1 capsule (300 mg total) by mouth 3 (three) times daily. 05/22/21   [name], DO  ?indomethacin (INDOCIN) 50 MG capsule TAKE 1 CAPSULE 2 TIMES DAILY AS NEEDED FOR FLARES. 07/11/21   [name], DO  ?lisinopril (ZESTRIL) 20 MG tablet Take 1 tablet (20 mg total) by mouth daily. 03/04/21   [name], DO  ?metFORMIN (GLUCOPHAGE XR) 500 MG 24 hr tablet Take 2 tablets (1,000 mg total) by mouth in the morning and at bedtime. 03/04/21   [name], DO  ?methocarbamol (ROBAXIN) 500 MG tablet Take 1 tablet (500 mg total) by mouth 2 (two) times daily. 05/22/21   [name], DO  ?Oxycodone HCl 10 MG TABS Take 0.5-1 tablets (5-10 mg total) by mouth every 8 (eight) hours as needed (Pain). 07/24/21   [name], DO  ?Oxycodone HCl 10 MG TABS Take 0.5-1 tablets (5-10 mg total) by mouth every 6 (six) hours as needed (Pain). 08/23/21   [name], DO  ?Oxycodone HCl 10 MG TABS Take 0.5-1 tablets (5-10 mg total) by mouth every 6 (six) hours as needed. 09/22/21   [name], DO  ?rOPINIRole (REQUIP) 4 MG tablet TAKE 1 TABLET BY MOUTH AT 8AM FOR  RESTLESS LEGS AND 2 AT 5PM 07/11/21   [name], DO  ?traZODone (DESYREL) 50 MG tablet TAKE 0.5-1 TABLETS BY MOUTH AT BEDTIME AS NEEDED FOR SLEEP. 03/07/21   [name], DO  ?   ? ?Allergies    ?Patient has no known allergies.   ? ?Review of Systems   ?Review of Systems  ?Skin:  Positive for color change and wound.  ?All other systems reviewed and are negative. ? ?Physical Exam ?Updated Vital Signs ?BP (!) 121/92 (BP Location: Right Arm)   Pulse 64   Temp 97.9 ?F (36.6 ?C) (Oral)   Resp 18   Ht 5' 9"  (1.753 m)   Wt 127 kg   SpO2 99%   BMI 41.35 kg/m?  ?Physical Exam ?Vitals and nursing note reviewed.   ?Constitutional:   ?   General: He is not in acute distress. ?   Appearance: Normal appearance. He is well-developed. He is not ill-appearing, toxic-appearing or diaphoretic.  ?HENT:  ?   Head: Normocephalic and atraumatic.  ?   Nose: No nasal deformity.  ?   Mouth/Throat:  ?   Lips: Pink. No lesions.  ?Eyes:  ?   General: Gaze aligned appropriately. No scleral icterus.    ?   Right eye: No discharge.     ?   Left eye: No discharge.  ?   Conjunctiva/sclera: Conjunctivae normal.  ?   Right eye: Right conjunctiva is not injected. No exudate or hemorrhage. ?   Left eye: Left conjunctiva is not injected. No exudate or hemorrhage. ?Pulmonary:  ?   Effort: Pulmonary effort is normal. No respiratory distress.  ?Skin: ?   General: Skin is warm and dry.  ?   Findings: Wound present.  ? ?    ?   Comments: See photo below. Small ulcerated lesion with surrounding erythema. No abnormal drainage. Not fluctuant to touch.   ?Neurological:  ?   Mental Status: He is alert and oriented to person, place, and time.  ?Psychiatric:     ?   Mood and Affect: Mood normal.     ?   Speech: Speech normal.     ?   Behavior: Behavior normal. Behavior is cooperative.  ? ? ? ?ED Results / Procedures / Treatments   ?Labs ?(all labs ordered are listed, but only abnormal results are displayed) ?Labs Reviewed - No data to display ? ?EKG ?None ? ?Radiology ?No results found. ? ?Procedures ?Procedures  ? ? ?Medications Ordered in ED ?Medications - No data to display ? ?ED Course/ Medical Decision Making/ A&P ?  ?                        ?Medical Decision Making ?Risk ?Prescription drug management. ? ? ? ?MDM  ?This is a 55 y.o. male who presents to the ED with spider bite two days ago ? ?My Impression, Plan, and ED Course: Patient appears well.  Normal vitals.  He has a wound on his left wrist that has some minimal surrounding erythema.  No abnormal drainage. It is not necrotic.  It looks to be ulcerated over.  He thinks that it was a brown recluse  spider.  No other systemic symptoms to suggest toxicity.  Given history of diabetes and increased risk for infection, will prophylactically treat with antibiotics.  Patient provided return precautions. ? ? ?Charting Requirements ?Additional history is obtained from:  Independent historian ?External Records from outside source obtained and  reviewed including: Recent internal medicine appt for foot wound in Feb 2023 ?Social Determinants of Health:  none ?Pertinant PMH that complicates patient's illness: DM ? ?Patient Care ?Problems that were addressed during this visit: ?- Spider Bite: Acute illness with complication ?I have reviewed home medications and made changes accordingly. Abx course ?Disposition: discharge. Return precautions ? ?Portions of this note were generated with [employer]. Dictation errors may occur despite best attempts at proofreading. ?  ? ?  ?  ?Final Clinical Impression(s) / ED Diagnoses ?Final diagnoses:  ?Insect bite of left wrist, initial encounter  ? ? ?Rx / DC Orders ?ED Discharge Orders   ? ?      Ordered  ?  cephALEXin (KEFLEX) 500 MG capsule  4 times daily       ? 08/01/21 1515  ? ?  ?  ? ?  ? ? ?  ?Adolphus Birchwood, PA-C ?08/01/21 1521 ? ?  ?Lennice Sites, DO ?08/01/21 1543 ? ?

## 2021-08-13 ENCOUNTER — Encounter: Payer: Self-pay | Admitting: Family Medicine

## 2021-08-16 ENCOUNTER — Other Ambulatory Visit: Payer: Self-pay | Admitting: Pulmonary Disease

## 2021-08-16 DIAGNOSIS — G4733 Obstructive sleep apnea (adult) (pediatric): Secondary | ICD-10-CM

## 2021-08-16 NOTE — Progress Notes (Signed)
This order is set for 08/23/21. New order not needed.  ?

## 2021-08-16 NOTE — Progress Notes (Signed)
I left a message to verify if the patient has a machine.  ?

## 2021-08-18 ENCOUNTER — Other Ambulatory Visit: Payer: Self-pay | Admitting: Family Medicine

## 2021-08-19 ENCOUNTER — Ambulatory Visit (INDEPENDENT_AMBULATORY_CARE_PROVIDER_SITE_OTHER): Payer: Medicare Other | Admitting: Family Medicine

## 2021-08-19 ENCOUNTER — Ambulatory Visit: Payer: Medicare Other | Admitting: Pulmonary Disease

## 2021-08-19 ENCOUNTER — Encounter: Payer: Self-pay | Admitting: Family Medicine

## 2021-08-19 VITALS — BP 132/82 | HR 78 | Temp 98.1°F | Ht 69.0 in | Wt 304.2 lb

## 2021-08-19 DIAGNOSIS — F5104 Psychophysiologic insomnia: Secondary | ICD-10-CM

## 2021-08-19 DIAGNOSIS — Z1211 Encounter for screening for malignant neoplasm of colon: Secondary | ICD-10-CM | POA: Diagnosis not present

## 2021-08-19 DIAGNOSIS — E1165 Type 2 diabetes mellitus with hyperglycemia: Secondary | ICD-10-CM | POA: Diagnosis not present

## 2021-08-19 MED ORDER — INDOMETHACIN 50 MG PO CAPS: ORAL_CAPSULE | ORAL | 0 refills | Status: DC

## 2021-08-19 MED ORDER — METHOCARBAMOL 500 MG PO TABS: 500.0000 mg | ORAL_TABLET | Freq: Two times a day (BID) | ORAL | 2 refills | Status: DC

## 2021-08-19 MED ORDER — DIAZEPAM 10 MG PO TABS: ORAL_TABLET | ORAL | 1 refills | Status: DC
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
## 2021-08-19 NOTE — Patient Instructions (Addendum)
Give [place] 2-3 business days to get the results of your labs back.  ? ?Keep the diet clean and stay active. ? ?Try the Robaxin to see if it can relax you first.  ? ?Don't take oxycodone and valium at the same time.  ? ?If you do not hear anything about your referral in the next 1-2 weeks, call our office and ask for an update. ? ?Let us know if you need anything. ?

## 2021-08-21 ENCOUNTER — Other Ambulatory Visit: Payer: Medicare Other

## 2021-08-22 ENCOUNTER — Other Ambulatory Visit: Payer: Self-pay | Admitting: Family Medicine

## 2021-08-22 NOTE — Telephone Encounter (Signed)
He should call pharmacy for the next 2 fills. Orders are already there. Ty.  ?

## 2021-08-23 NOTE — Telephone Encounter (Signed)
Patient informed. 

## 2021-09-17 ENCOUNTER — Other Ambulatory Visit: Payer: Self-pay | Admitting: Family Medicine
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
## 2021-09-24 ENCOUNTER — Ambulatory Visit (INDEPENDENT_AMBULATORY_CARE_PROVIDER_SITE_OTHER): Payer: Medicare Other | Admitting: Adult Health

## 2021-09-24 ENCOUNTER — Encounter: Payer: Self-pay | Admitting: Adult Health

## 2021-09-24 VITALS — BP 140/98 | HR 84 | Ht 69.0 in | Wt 311.0 lb

## 2021-09-24 DIAGNOSIS — I10 Essential (primary) hypertension: Secondary | ICD-10-CM

## 2021-09-24 DIAGNOSIS — I43 Cardiomyopathy in diseases classified elsewhere: Secondary | ICD-10-CM

## 2021-09-24 DIAGNOSIS — E7800 Pure hypercholesterolemia, unspecified: Secondary | ICD-10-CM

## 2021-09-24 DIAGNOSIS — I5032 Chronic diastolic (congestive) heart failure: Secondary | ICD-10-CM

## 2021-09-24 DIAGNOSIS — E1165 Type 2 diabetes mellitus with hyperglycemia: Secondary | ICD-10-CM

## 2021-09-24 MED ORDER — CARVEDILOL 6.25 MG PO TABS: 6.2500 mg | ORAL_TABLET | Freq: Two times a day (BID) | ORAL | 2 refills | Status: DC

## 2021-09-24 NOTE — Patient Instructions (Signed)
Medication Instructions:  No Changes *If you need a refill on your cardiac medications before your next appointment, please call your pharmacy*   Lab Work: No Labs If you have labs (blood work) drawn today and your tests are completely normal, you will receive your results only by: [place] (if you have MyChart) OR A paper copy in the mail If you have any lab test that is abnormal or we need to change your treatment, we will call you to review the results.   Testing/Procedures: No Testing   Follow-Up: At [facility], you and your health needs are our priority.  As part of our continuing mission to provide you with exceptional heart care, we have created designated Provider Care Teams.  These Care Teams include your primary Cardiologist (physician) and Advanced Practice Providers (APPs -  Physician Assistants and Nurse Practitioners) who all work together to provide you with the care you need, when you need it.  We recommend signing up for the patient portal called "MyChart".  Sign up information is provided on this After Visit Summary.  MyChart is used to connect with patients for Virtual Visits (Telemedicine).  Patients are able to view lab/test results, encounter notes, upcoming appointments, etc.  Non-urgent messages can be sent to your provider as well.   To learn more about what you can do with MyChart, go to [email or web address].    Your next appointment:   1 year(s)  The format for your next appointment:   In Person  Provider:   Elouise Munroe, MD       Important Information About Sugar

## 2021-10-17 ENCOUNTER — Other Ambulatory Visit: Payer: Self-pay | Admitting: Family Medicine

## 2021-10-23 ENCOUNTER — Other Ambulatory Visit: Payer: Self-pay | Admitting: Family Medicine

## 2021-10-23 MED ORDER — OXYCODONE HCL 10 MG PO TABS: 5.0000 mg | ORAL_TABLET | Freq: Four times a day (QID) | ORAL | 0 refills | Status: DC | PRN

## 2021-10-23 NOTE — Telephone Encounter (Signed)
Requesting: oxycodone hcl '10mg'$  Contract: none UDS: none Last Visit: 09/24/21 Next Visit: none Last Refill: 09/22/21  Please Advise

## 2021-10-25 ENCOUNTER — Telehealth: Payer: Self-pay | Admitting: *Deleted

## 2021-10-25 NOTE — Telephone Encounter (Signed)
Attempted to call x 2 unable to leave message voicemail box has not been set up.

## 2021-10-25 NOTE — Telephone Encounter (Signed)
No show pre-visit procedure cancelled,no show letter sent via my chart and mailed.

## 2021-11-14 ENCOUNTER — Ambulatory Visit: Payer: Medicare Other | Admitting: Family Medicine

## 2021-11-16 ENCOUNTER — Other Ambulatory Visit: Payer: Self-pay | Admitting: Family Medicine

## 2021-11-18 ENCOUNTER — Other Ambulatory Visit: Payer: Self-pay | Admitting: Family Medicine

## 2021-11-18 MED ORDER — ATORVASTATIN CALCIUM 80 MG PO TABS: 80.0000 mg | ORAL_TABLET | Freq: Every day | ORAL | 0 refills | Status: DC

## 2021-11-20 ENCOUNTER — Ambulatory Visit (INDEPENDENT_AMBULATORY_CARE_PROVIDER_SITE_OTHER): Payer: Medicare Other | Admitting: Family Medicine

## 2021-11-20 ENCOUNTER — Other Ambulatory Visit: Payer: Self-pay | Admitting: Family Medicine

## 2021-11-20 ENCOUNTER — Encounter: Payer: Self-pay | Admitting: Family Medicine

## 2021-11-20 ENCOUNTER — Encounter: Payer: Medicare Other | Admitting: Gastroenterology

## 2021-11-20 DIAGNOSIS — I10 Essential (primary) hypertension: Secondary | ICD-10-CM | POA: Diagnosis not present

## 2021-11-20 DIAGNOSIS — E1169 Type 2 diabetes mellitus with other specified complication: Secondary | ICD-10-CM

## 2021-11-20 DIAGNOSIS — L03113 Cellulitis of right upper limb: Secondary | ICD-10-CM | POA: Diagnosis not present

## 2021-11-20 DIAGNOSIS — F5104 Psychophysiologic insomnia: Secondary | ICD-10-CM

## 2021-11-20 MED ORDER — DOXYCYCLINE HYCLATE 100 MG PO TABS: 100.0000 mg | ORAL_TABLET | Freq: Two times a day (BID) | ORAL | 0 refills | Status: DC

## 2021-11-20 MED ORDER — OZEMPIC (0.25 OR 0.5 MG/DOSE) 2 MG/1.5ML ~~LOC~~ SOPN: PEN_INJECTOR | SUBCUTANEOUS | 1 refills | Status: AC

## 2021-11-20 NOTE — Patient Instructions (Signed)
Give [place] 2-3 business days to get the results of your labs back.   Keep the diet clean and stay active.  Things to look out for: increasing pain not relieved by ibuprofen/acetaminophen, fevers, spreading redness, drainage of pus, or foul odor.  Let us know if you need anything.
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
## 2021-11-21 NOTE — Telephone Encounter (Signed)
Requesting:valium 10 mg Contract:unknown [id number] Last Visit:11/21/21 Next Visit:11/29/21 Last Refill:08/19/21  Please Advise

## 2021-11-22 ENCOUNTER — Telehealth: Payer: Self-pay | Admitting: Family Medicine

## 2021-11-22 ENCOUNTER — Ambulatory Visit: Payer: Medicare Other | Admitting: Family Medicine

## 2021-11-22 NOTE — Telephone Encounter (Signed)
Received approval Patient/pharmacy informed

## 2021-11-22 NOTE — Telephone Encounter (Signed)
Initiated PA for Ozempic KEY:  [id number] Waiting response.

## 2021-11-27 ENCOUNTER — Encounter (INDEPENDENT_AMBULATORY_CARE_PROVIDER_SITE_OTHER): Payer: Self-pay

## 2021-11-29 ENCOUNTER — Ambulatory Visit: Payer: Medicare Other | Admitting: Family Medicine

## 2021-12-16 ENCOUNTER — Other Ambulatory Visit: Payer: Self-pay | Admitting: Family Medicine

## 2022-01-14 ENCOUNTER — Other Ambulatory Visit: Payer: Self-pay | Admitting: Family Medicine

## 2022-01-14 MED ORDER — INDOMETHACIN 50 MG PO CAPS: ORAL_CAPSULE | ORAL | 0 refills | Status: DC

## 2022-01-14 MED ORDER — AMLODIPINE BESYLATE 5 MG PO TABS: 5.0000 mg | ORAL_TABLET | Freq: Every day | ORAL | 1 refills | Status: DC

## 2022-01-15 ENCOUNTER — Other Ambulatory Visit: Payer: Self-pay | Admitting: Family Medicine

## 2022-01-15 DIAGNOSIS — M1A9XX0 Chronic gout, unspecified, without tophus (tophi): Secondary | ICD-10-CM

## 2022-01-16 ENCOUNTER — Other Ambulatory Visit: Payer: Self-pay | Admitting: Family Medicine

## 2022-01-16 MED ORDER — OXYCODONE HCL 10 MG PO TABS: 5.0000 mg | ORAL_TABLET | Freq: Four times a day (QID) | ORAL | 0 refills | Status: DC | PRN

## 2022-01-17 ENCOUNTER — Ambulatory Visit: Payer: Medicare Other | Admitting: Family Medicine

## 2022-02-14 ENCOUNTER — Other Ambulatory Visit: Payer: Self-pay | Admitting: Family Medicine

## 2022-02-14 ENCOUNTER — Encounter: Payer: Self-pay | Admitting: Family Medicine

## 2022-02-14 ENCOUNTER — Emergency Department (HOSPITAL_BASED_OUTPATIENT_CLINIC_OR_DEPARTMENT_OTHER): Payer: Medicare Other

## 2022-02-14 ENCOUNTER — Telehealth: Payer: Self-pay | Admitting: Family Medicine

## 2022-02-14 ENCOUNTER — Ambulatory Visit (INDEPENDENT_AMBULATORY_CARE_PROVIDER_SITE_OTHER): Payer: Medicare Other | Admitting: Family Medicine

## 2022-02-14 ENCOUNTER — Emergency Department (HOSPITAL_BASED_OUTPATIENT_CLINIC_OR_DEPARTMENT_OTHER)
Admission: EM | Admit: 2022-02-14 | Discharge: 2022-02-14 | Disposition: A | Discharge status: Home / Self Care | Payer: Medicare Other | Attending: Emergency Medicine | Admitting: Emergency Medicine

## 2022-02-14 ENCOUNTER — Other Ambulatory Visit: Payer: Self-pay

## 2022-02-14 ENCOUNTER — Encounter (HOSPITAL_BASED_OUTPATIENT_CLINIC_OR_DEPARTMENT_OTHER): Payer: Self-pay | Admitting: Emergency Medicine

## 2022-02-14 VITALS — BP 150/100 | HR 90 | Temp 98.6°F | Ht 70.0 in

## 2022-02-14 DIAGNOSIS — L03115 Cellulitis of right lower limb: Secondary | ICD-10-CM | POA: Diagnosis not present

## 2022-02-14 DIAGNOSIS — L97522 Non-pressure chronic ulcer of other part of left foot with fat layer exposed: Secondary | ICD-10-CM

## 2022-02-14 DIAGNOSIS — E11621 Type 2 diabetes mellitus with foot ulcer: Secondary | ICD-10-CM | POA: Diagnosis not present

## 2022-02-14 DIAGNOSIS — R2241 Localized swelling, mass and lump, right lower limb: Secondary | ICD-10-CM | POA: Diagnosis present

## 2022-02-14 DIAGNOSIS — Z7984 Long term (current) use of oral hypoglycemic drugs: Secondary | ICD-10-CM | POA: Insufficient documentation

## 2022-02-14 DIAGNOSIS — I110 Hypertensive heart disease with heart failure: Secondary | ICD-10-CM | POA: Insufficient documentation

## 2022-02-14 DIAGNOSIS — Z79899 Other long term (current) drug therapy: Secondary | ICD-10-CM | POA: Diagnosis not present

## 2022-02-14 DIAGNOSIS — I509 Heart failure, unspecified: Secondary | ICD-10-CM | POA: Insufficient documentation

## 2022-02-14 DIAGNOSIS — L97529 Non-pressure chronic ulcer of other part of left foot with unspecified severity: Secondary | ICD-10-CM | POA: Insufficient documentation

## 2022-02-14 LAB — COMPREHENSIVE METABOLIC PANEL
ALT: 11 U/L (ref 0–44)
AST: 16 U/L (ref 15–41)
Albumin: 3.6 g/dL (ref 3.5–5.0)
Alkaline Phosphatase: 92 U/L (ref 38–126)
Anion gap: 6 (ref 5–15)
BUN: 25 mg/dL — ABNORMAL HIGH (ref 6–20)
CO2: 30 mmol/L (ref 22–32)
Calcium: 8.5 mg/dL — ABNORMAL LOW (ref 8.9–10.3)
Chloride: 103 mmol/L (ref 98–111)
Creatinine, Ser: 1.17 mg/dL (ref 0.61–1.24)
GFR, Estimated: >60 mL/min (ref >60)
Glucose, Bld: 155 mg/dL — ABNORMAL HIGH (ref 70–99)
Potassium: 3.7 mmol/L (ref 3.5–5.1)
Sodium: 139 mmol/L (ref 135–145)
Total Bilirubin: 0.4 mg/dL (ref 0.3–1.2)
Total Protein: 7.6 g/dL (ref 6.5–8.1)

## 2022-02-14 LAB — CBC WITH DIFFERENTIAL/PLATELET
Abs Immature Granulocytes: 0.05 10*3/uL (ref 0.00–0.07)
Basophils Absolute: 0 10*3/uL (ref 0.0–0.1)
Basophils Relative: 0 %
Eosinophils Absolute: 0.6 10*3/uL — ABNORMAL HIGH (ref 0.0–0.5)
Eosinophils Relative: 6 %
HCT: 38.7 % — ABNORMAL LOW (ref 39.0–52.0)
Hemoglobin: 12.5 g/dL — ABNORMAL LOW (ref 13.0–17.0)
Immature Granulocytes: 1 %
Lymphocytes Relative: 21 %
Lymphs Abs: 2.1 10*3/uL (ref 0.7–4.0)
MCH: 26.4 pg (ref 26.0–34.0)
MCHC: 32.3 g/dL (ref 30.0–36.0)
MCV: 81.6 fL (ref 80.0–100.0)
Monocytes Absolute: 0.7 10*3/uL (ref 0.1–1.0)
Monocytes Relative: 7 %
Neutro Abs: 6.7 10*3/uL (ref 1.7–7.7)
Neutrophils Relative %: 65 %
Platelets: 195 10*3/uL (ref 150–400)
RBC: 4.74 MIL/uL (ref 4.22–5.81)
RDW: 15.5 % (ref 11.5–15.5)
WBC: 10.2 10*3/uL (ref 4.0–10.5)
nRBC: 0 % (ref 0.0–0.2)

## 2022-02-14 LAB — LACTIC ACID, PLASMA: Lactic Acid, Venous: 1.5 mmol/L (ref 0.5–1.9)

## 2022-02-14 LAB — CBG MONITORING, ED: Glucose-Capillary: 166 mg/dL — ABNORMAL HIGH (ref 70–99)

## 2022-02-14 MED ORDER — CEPHALEXIN 500 MG PO CAPS: 500.0000 mg | ORAL_CAPSULE | Freq: Four times a day (QID) | ORAL | 0 refills | Status: AC

## 2022-02-14 MED ORDER — DOXYCYCLINE HYCLATE 100 MG PO CAPS: 100.0000 mg | ORAL_CAPSULE | Freq: Two times a day (BID) | ORAL | 0 refills | Status: DC

## 2022-02-14 MED ORDER — DOXYCYCLINE HYCLATE 100 MG PO TABS
100.0000 mg | ORAL_TABLET | Freq: Once | ORAL | Status: AC
  Administered 2022-02-14: 100 mg via ORAL
  Filled 2022-02-14: qty 1

## 2022-02-14 MED ORDER — CEPHALEXIN 250 MG PO CAPS
500.0000 mg | ORAL_CAPSULE | Freq: Once | ORAL | Status: AC
  Administered 2022-02-14: 500 mg via ORAL
  Filled 2022-02-14: qty 2

## 2022-02-14 MED ORDER — MORPHINE SULFATE (PF) 4 MG/ML IV SOLN
4.0000 mg | Freq: Once | INTRAVENOUS | Status: AC
  Administered 2022-02-14: 4 mg via INTRAVENOUS
  Filled 2022-02-14: qty 1

## 2022-02-14 MED ORDER — HYDROCODONE-ACETAMINOPHEN 5-325 MG PO TABS: 2.0000 | ORAL_TABLET | ORAL | 0 refills | Status: DC | PRN

## 2022-02-14 NOTE — ED Triage Notes (Signed)
Reports sent from PCP for right foot cellulitis , drainage and edema ., symptoms x 1 month . Hx Diabetes II. Painful , obvious discomfort with ambulation .

## 2022-02-14 NOTE — ED Provider Notes (Signed)
[facility] Provider Note   CSN: [id number] Arrival date & time: 02/14/22  1147     History  Chief Complaint  Patient presents with   Foot Pain   Wound Infection    Justin Hall is a 56 y.o. male.  Patient presents to the hospital complaining of an ulceration on the lateral side of his left foot near the fifth toe.  The patient states the wound has been there for approximately 1 month.  He reports a history of staph infections in the same foot and has previously seen wound care for the same.  This ulceration is new.  He denies any known injury or trauma to the area.  He reports swelling, erythema, and pain in the left foot.  He also endorses drainage from the wound.  He denies nausea, vomiting, shortness of breath, fevers.  He was seen by his primary care provider who recommended he come to the emergency department for further evaluation.  Past medical history significant for DM without complication, arthritis, hypertension, CHF, anxiety, gout  HPI     Home Medications Prior to Admission medications   Medication Sig Start Date End Date Taking? Authorizing Provider  HYDROcodone-acetaminophen (NORCO/VICODIN) 5-325 MG tablet Take 2 tablets by mouth every 4 (four) hours as needed. 02/14/22  Yes Dorothyann Peng, PA-C  albuterol (VENTOLIN HFA) 108 (90 Base) MCG/ACT inhaler Inhale 2 puffs into the lungs every 6 (six) hours as needed for wheezing or shortness of breath. 02/01/21   [name], DO  allopurinol (ZYLOPRIM) 300 MG tablet TAKE 1 TABLET(300 MG) BY MOUTH TWICE DAILY 01/15/22   Nani Ravens, Crosby Oyster, DO  amLODipine (NORVASC) 5 MG tablet Take 1 tablet (5 mg total) by mouth daily. 01/14/22   [name], DO  atorvastatin (LIPITOR) 80 MG tablet TAKE 1 TABLET(80 MG) BY MOUTH DAILY 02/14/22   [name], DO  blood glucose meter kit and supplies Dispense based on patient and insurance preference. Use up to four times  daily as directed. (FOR ICD-10 E10.9, E11.9). 10/21/18   Lavina Hamman, MD  carvedilol (COREG) 6.25 MG tablet Take 1 tablet (6.25 mg total) by mouth 2 (two) times daily with a meal. 09/24/21   Lendon Colonel, NP  cephALEXin (KEFLEX) 500 MG capsule Take 1 capsule (500 mg total) by mouth 4 (four) times daily for 10 days. 02/14/22 02/24/22 Yes Arynn Armand, Fritz Pickerel B, PA-C  diazepam (VALIUM) 10 MG tablet TAKE 1/2 TO 1 TABLET BY MOUTH EVERY 12 HOURS AS NEEDED FOR ANXIETY 11/21/21   Wendling, Crosby Oyster, DO  doxycycline (VIBRAMYCIN) 100 MG capsule Take 1 capsule (100 mg total) by mouth 2 (two) times daily. 02/14/22  Yes Cherlynn June B, PA-C  furosemide (LASIX) 40 MG tablet TAKE 1 TABLET(40 MG) BY MOUTH TWICE DAILY 02/14/22   Wendling, Crosby Oyster, DO  gabapentin (NEURONTIN) 300 MG capsule TAKE 1 CAPSULE(300 MG) BY MOUTH THREE TIMES DAILY 11/18/21   Nani Ravens, Crosby Oyster, DO  indomethacin (INDOCIN) 50 MG capsule Take twice daily as needed for gout flares. 01/14/22   [name], DO  lisinopril (ZESTRIL) 20 MG tablet Take 1 tablet (20 mg total) by mouth daily. 03/04/21   [name], DO  metFORMIN (GLUCOPHAGE XR) 500 MG 24 hr tablet Take 2 tablets (1,000 mg total) by mouth in the morning and at bedtime. 03/04/21   [name], DO  methocarbamol (ROBAXIN) 500 MG tablet Take 1 tablet (500 mg total) by mouth 2 (two)  times daily. 08/19/21   [name], DO  Oxycodone HCl 10 MG TABS Take 0.5-1 tablets (5-10 mg total) by mouth every 6 (six) hours as needed. 01/16/22   [name], DO  Oxycodone HCl 10 MG TABS Take 0.5-1 tablets (5-10 mg total) by mouth every 6 (six) hours as needed (Pain). 02/15/22   [name], DO  Oxycodone HCl 10 MG TABS Take 0.5-1 tablets (5-10 mg total) by mouth every 6 (six) hours as needed (Pain). 03/17/22   [name], DO  rOPINIRole (REQUIP) 4 MG tablet TAKE 1 TABLET BY MOUTH AT 8AM FOR RESTLESS LEGS AND 2  AT 5PM 07/11/21   [name], DO      Allergies    Patient has no known allergies.    Review of Systems   Review of Systems  Musculoskeletal:        Left foot swelling  Skin:  Positive for color change and wound.       Erythema of left foot    Physical Exam Updated Vital Signs BP (!) 147/80 (BP Location: Left Arm)   Pulse 84   Temp 98.5 F (36.9 C) (Oral)   Resp 18   Ht _0  (1.753 m)   Wt (!) 140.6 kg   SpO2 96%   BMI 45.78 kg/m  Physical Exam HENT:     Head: Normocephalic and atraumatic.     Mouth/Throat:     Mouth: Mucous membranes are moist.  Eyes:     Conjunctiva/sclera: Conjunctivae normal.  Cardiovascular:     Rate and Rhythm: Normal rate and regular rhythm.     Pulses: Normal pulses.  Pulmonary:     Effort: Pulmonary effort is normal. No respiratory distress.  Musculoskeletal:        General: Swelling present. No signs of injury.     Cervical back: Normal range of motion.     Comments: Significant swelling of the left foot.  Ulceration of left foot seen in images below.  Palpable pedal pulse  Skin:    General: Skin is dry.     Capillary Refill: Capillary refill takes less than 2 seconds.     Findings: Erythema and lesion present.  Neurological:     Mental Status: He is alert.  Psychiatric:        Speech: Speech normal.        Behavior: Behavior normal.     ED Results / Procedures / Treatments   Labs (all labs ordered are listed, but only abnormal results are displayed) Labs Reviewed  COMPREHENSIVE METABOLIC PANEL - Abnormal; Notable for the following components:      Result Value   Glucose, Bld 155 (*)    BUN 25 (*)    Calcium 8.5 (*)    All other components within normal limits  CBC WITH DIFFERENTIAL/PLATELET - Abnormal; Notable for the following components:   Hemoglobin 12.5 (*)    HCT 38.7 (*)    Eosinophils Absolute 0.6 (*)    All other components within normal limits  CBG MONITORING, ED - Abnormal; Notable for the  following components:   Glucose-Capillary 166 (*)    All other components within normal limits  AEROBIC/ANAEROBIC CULTURE W GRAM STAIN (SURGICAL/DEEP WOUND)  LACTIC ACID, PLASMA    EKG None  Radiology DG Foot Complete Left  Result Date: 02/14/2022 CLINICAL DATA:  Ulceration lateral left foot. EXAM: LEFT FOOT - COMPLETE 3+ VIEW COMPARISON:  Left foot MRI 05/25/2021 FINDINGS: There is diffuse soft  tissue swelling of the lateral foot, dorsum foot and fifth toe. Ulceration is seen lateral to the base of the fifth toe. There is no underlying cortical erosion or periosteal reaction. There is no acute fracture or dislocation. No foreign body identified. There is a healed third metatarsal fracture. Hallux valgus and moderate degenerative changes at the first metatarsophalangeal joint are again noted. Peripheral vascular calcifications are present. IMPRESSION: Soft tissue swelling of the foot with ulceration lateral to the fifth toe. No radiographic evidence of osteomyelitis. Electronically Signed   By: Ronney Asters M.D.   On: 02/14/2022 16:10   DG Chest 2 View  Result Date: 02/14/2022 CLINICAL DATA:  Left foot infection. EXAM: CHEST - 2 VIEW COMPARISON:  Chest x-ray dated January 28, 2021. FINDINGS: The heart size and mediastinal contours are within normal limits. Normal pulmonary vascularity. Unchanged mild elevation of the right hemidiaphragm. Overlying mild right basilar atelectasis. No focal consolidation, pleural effusion, or pneumothorax. No acute osseous abnormality. IMPRESSION: 1. No acute cardiopulmonary disease. Electronically Signed   By: Titus Dubin M.D.   On: 02/14/2022 13:30    Procedures Procedures    Medications Ordered in ED Medications  cephALEXin (KEFLEX) capsule 500 mg (has no administration in time range)  doxycycline (VIBRA-TABS) tablet 100 mg (has no administration in time range)  morphine (PF) 4 MG/ML injection 4 mg (4 mg Intravenous Given 02/14/22 1648)    ED  Course/ Medical Decision Making/ A&P                           Medical Decision Making Amount and/or Complexity of Data Reviewed Labs: ordered. Radiology: ordered.  Risk Prescription drug management.   This patient presents to the ED for concern of left foot ulceration, this involves an extensive number of treatment options, and is a complaint that carries with it a high risk of complications and morbidity.  The differential diagnosis includes cellulitis, osteomyelitis, compartment syndrome, and others   Co morbidities that complicate the patient evaluation  History of staph infection, diabetes   Additional history obtained:  External records from outside source obtained and reviewed including primary care notes from today showing visit for diabetic ulcer of the foot   Lab Tests:  I Ordered, and personally interpreted labs.  The pertinent results include: Unremarkable CBC, CMP, lactic acid 1.5, CBG 166.  Wound culture pending   Imaging Studies ordered:  I ordered imaging studies including plain films of the left foot and chest x-ray I independently visualized and interpreted imaging which showed  Soft tissue swelling of the foot with ulceration lateral to the  fifth toe. No radiographic evidence of osteomyelitis.  1. No acute cardiopulmonary disease. I agree with the radiologist interpretation   Problem List / ED Course / Critical interventions / Medication management   I ordered medication including morphine for pain, Dalvance for infection.  Dalvance was canceled by pharmacy.  Patient instead given doxycycline and Keflex Reevaluation of the patient after these medicines showed that the patient improved I have reviewed the patients home medicines and have made adjustments as needed    Test / Admission - Considered:  Imaging shows no sign of osteomyelitis at this time.  He does have significant cellulitis about the left foot.  The patient does not want admission.  I  discussed using Dalvance but due to the potential polymicrobial nature of a diabetic foot ulcer pharmacy recommends against him.  Plan to discharge instead of doxycycline and Keflex.  The patient also needs follow-up with podiatry.  Podiatry contact information placed in AVS.  Compartments are soft and pain is not out of proportion.  I see no signs of compartment syndrome at this time.  Plan to discharge home        Final Clinical Impression(s) / ED Diagnoses Final diagnoses:  Cellulitis of right lower extremity  Diabetic ulcer of left foot associated with type 2 diabetes mellitus, unspecified part of foot, unspecified ulcer stage ([place])    Rx / DC Orders ED Discharge Orders          Ordered    HYDROcodone-acetaminophen (NORCO/VICODIN) 5-325 MG tablet  Every 4 hours PRN        02/14/22 1656    cephALEXin (KEFLEX) 500 MG capsule  4 times daily        02/14/22 1749    doxycycline (VIBRAMYCIN) 100 MG capsule  2 times daily        02/14/22 1749              Dorothyann Peng, PA-C 02/14/22 [place], [place], DO 02/14/22 2308

## 2022-02-14 NOTE — ED Notes (Signed)
Patient's foot was wrapped in non-stick gauze after being cleaned.

## 2022-02-14 NOTE — ED Provider Triage Note (Signed)
Emergency Medicine Provider Triage Evaluation Note  Justin Hall , a 56 y.o. male  was evaluated in triage.  Pt complains of an ulceration on the lateral side of of the left foot.  Patient states the wound has been there for approximately 1 month.  He states he has a history of staph infections in the foot and is seeing wound care in the past.  This is a new ulceration.  He denies any injury or trauma to the area.  He denies any shortness of breath, fevers, nausea.  He states the wound has become more painful and that his ankle has become more swollen over the past few weeks.  He does endorse moderate drainage from the wound.  His primary care provider sent him here today for evaluation.  Review of Systems  Positive: As above Negative: As above  Physical Exam  BP (!) 175/90 (BP Location: Left Arm)   Pulse 81   Temp 98 F (36.7 C) (Oral)   Resp 18   Ht '5\' 9"'$  (1.753 m)   Wt (!) 140.6 kg   SpO2 95%   BMI 45.78 kg/m  Gen:   Awake, no distress   Resp:  Normal effort  MSK:   Moves extremities without difficulty  Other:       Medical Decision Making  Medically screening exam initiated at 3:21 PM.  Appropriate orders placed.  Shota Kohrs was informed that the remainder of the evaluation will be completed by another provider, this initial triage assessment does not replace that evaluation, and the importance of remaining in the ED until their evaluation is complete.     Dorothyann Peng, PA-C 02/14/22 1530

## 2022-02-14 NOTE — Patient Instructions (Signed)
Please call your insurance company to find out what weekly shot they will cover.   I will finish this paperwork today.   Let us know if you need anything.
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
## 2022-02-14 NOTE — Telephone Encounter (Signed)
PCP completed DMV paperwork. Called the patients wife informed paperwork done and ready for pickup at the front desk.

## 2022-02-14 NOTE — Discharge Instructions (Addendum)
You were seen today due to an ulcer of the left foot.  This ulcer is consistent with a diabetic foot ulcer.  You also appear to have cellulitis of the left foot which is causing the redness and swelling.  You were given antibiotic while here in the emergency department today.  Please follow-up with podiatry at the number listed above.  You need further evaluation of your feet.  I did prescribe a very short course of pain medication to get you through the night to your primary providers prescription arrives tomorrow.  If you develop any life-threatening conditions please return to the emergency department

## 2022-02-14 NOTE — ED Notes (Signed)
Culture collected by ED PA

## 2022-02-14 NOTE — Telephone Encounter (Signed)
Refilled today

## 2022-02-17 ENCOUNTER — Other Ambulatory Visit: Payer: Self-pay | Admitting: Family Medicine

## 2022-02-17 LAB — AEROBIC CULTURE W GRAM STAIN (SUPERFICIAL SPECIMEN): Gram Stain: NONE SEEN

## 2022-02-18 ENCOUNTER — Telehealth (HOSPITAL_BASED_OUTPATIENT_CLINIC_OR_DEPARTMENT_OTHER): Payer: Self-pay

## 2022-02-18 ENCOUNTER — Ambulatory Visit (INDEPENDENT_AMBULATORY_CARE_PROVIDER_SITE_OTHER): Payer: Medicare Other | Admitting: Podiatry

## 2022-02-18 DIAGNOSIS — L97522 Non-pressure chronic ulcer of other part of left foot with fat layer exposed: Secondary | ICD-10-CM | POA: Diagnosis not present

## 2022-02-18 DIAGNOSIS — M216X2 Other acquired deformities of left foot: Secondary | ICD-10-CM | POA: Diagnosis not present

## 2022-02-18 MED ORDER — SILVER SULFADIAZINE 1 % EX CREA: 1.0000 | TOPICAL_CREAM | Freq: Every day | CUTANEOUS | 0 refills | Status: DC

## 2022-02-18 NOTE — Telephone Encounter (Signed)
Post ED Visit - Positive Culture Follow-up: Unsuccessful Patient Follow-up  Culture assessed and recommendations reviewed by:  '[x]'$  Kendall Heetderks, Pharm.D. '[]'$  Heide Guile, Pharm.D., BCPS AQ-ID '[]'$  Parks Neptune, Pharm.D., BCPS '[]'$  Alycia Rossetti, Pharm.D., BCPS '[]'$  [place], [place].D., BCPS, AAHIVP '[]'$  Legrand Como, Pharm.D., BCPS, AAHIVP '[]'$  Wynell Balloon, PharmD '[]'$  Vincenza Hews, PharmD, BCPS  Positive wound culture  '[]'$  Patient discharged without antimicrobial prescription and treatment is now indicated '[x]'$  Organism is resistant to prescribed ED discharge antimicrobial '[]'$  Patient with positive blood cultures  Plan Discontinue doxycycline and cephalexin. start Cefdinir 300 mg po BID x 10 days and start Clindamycin 600 mg po TID x 10 days per ED provider Theodis Blaze, PA-C. Advise pt to monitor closely, if no improvement after a few days of abx follow up with provider   Unable to contact patient after 3 attempts, letter will be sent to address on file  Glennon Hamilton 02/18/2022, 2:52 PM
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
## 2022-02-18 NOTE — Patient Instructions (Addendum)
Wash foot with soap and water. Dry well. Apply silvadene and a dressing. Change daily Wear boot for offloading Get blood work done I have ordered a circulation test to make sure you have adequate blood flow.   Monitor for any signs/symptoms of infection. Call the office immediately if any occur or go directly to the emergency room. Call with any questions/concerns.

## 2022-02-18 NOTE — Progress Notes (Signed)
ED Antimicrobial Stewardship Positive Culture Follow Up   Justin Hall is an 56 y.o. male who presented to [facility] on 02/14/2022 with a chief complaint of  Chief Complaint  Patient presents with   Foot Pain   Wound Infection    Recent Results (from the past 720 hour(s))  Aerobic Culture w Gram Stain (superficial specimen)     Status: None   Collection Time: 02/14/22  7:38 PM   Specimen: Wound  Result Value Ref Range Status   Specimen Description   Final    WOUND LEFT FOOT Performed at [facility], [address], [place], [place] [address]    Special Requests   Final    NONE Performed at [facility], [address] [place], [place] [address]    Gram Stain   Final    NO WBC SEEN FEW GRAM POSITIVE COCCI IN PAIRS IN SINGLES RARE GRAM NEGATIVE RODS Performed at [facility], [place] [address] [place], [place] [address]    Culture   Final    MODERATE METHICILLIN RESISTANT STAPHYLOCOCCUS AUREUS RARE PROVIDENCIA RETTGERI    Report Status 02/17/2022 FINAL  Final   Organism ID, Bacteria METHICILLIN RESISTANT STAPHYLOCOCCUS AUREUS  Final   Organism ID, Bacteria PROVIDENCIA RETTGERI  Final      Susceptibility   Methicillin resistant staphylococcus aureus - MIC*    CIPROFLOXACIN >=8 RESISTANT Resistant     ERYTHROMYCIN >=8 RESISTANT Resistant     GENTAMICIN <=0.5 SENSITIVE Sensitive     OXACILLIN >=4 RESISTANT Resistant     TETRACYCLINE >=16 RESISTANT Resistant     VANCOMYCIN 1 SENSITIVE Sensitive     TRIMETH/SULFA >=320 RESISTANT Resistant     CLINDAMYCIN <=0.25 SENSITIVE Sensitive     RIFAMPIN <=0.5 SENSITIVE Sensitive     Inducible Clindamycin NEGATIVE Sensitive     * MODERATE METHICILLIN RESISTANT STAPHYLOCOCCUS AUREUS   Providencia rettgeri - MIC*    AMPICILLIN RESISTANT Resistant     CEFAZOLIN RESISTANT Resistant     CEFEPIME <=0.12 SENSITIVE Sensitive     CEFTAZIDIME <=1 SENSITIVE Sensitive     CEFTRIAXONE <=0.25 SENSITIVE Sensitive      CIPROFLOXACIN <=0.25 SENSITIVE Sensitive     GENTAMICIN <=1 SENSITIVE Sensitive     IMIPENEM 1 SENSITIVE Sensitive     TRIMETH/SULFA <=20 SENSITIVE Sensitive     AMPICILLIN/SULBACTAM <=2 SENSITIVE Sensitive     PIP/TAZO <=4 SENSITIVE Sensitive     * RARE PROVIDENCIA RETTGERI    '[x]'$  Treated with Cephalexin and Doxycycline, organism resistant to prescribed antimicrobial  New antibiotic prescription:  Cefdinir '300mg'$  BID x 10 days Clindamycin '600mg'$  TID x 10 days Monitor closely for worsening condition.    ED Provider:  Theodis Blaze, PA-C   [name] 02/18/2022, 3:45 PM Clinical Pharmacist Monday - Friday phone -  [phone] Saturday - Sunday phone - [phone]

## 2022-02-21 ENCOUNTER — Ambulatory Visit: Payer: Medicare Other | Admitting: Family Medicine

## 2022-02-21 ENCOUNTER — Ambulatory Visit (HOSPITAL_COMMUNITY)
Admission: RE | Admit: 2022-02-21 | Discharge: 2022-02-21 | Disposition: A | Discharge status: Home / Self Care | Payer: Medicare Other | Source: Ambulatory Visit | Attending: Podiatry | Admitting: Podiatry

## 2022-02-21 DIAGNOSIS — L97522 Non-pressure chronic ulcer of other part of left foot with fat layer exposed: Secondary | ICD-10-CM

## 2022-03-03 ENCOUNTER — Ambulatory Visit (INDEPENDENT_AMBULATORY_CARE_PROVIDER_SITE_OTHER): Payer: Medicare Other | Admitting: Podiatry

## 2022-03-03 DIAGNOSIS — L97522 Non-pressure chronic ulcer of other part of left foot with fat layer exposed: Secondary | ICD-10-CM | POA: Diagnosis not present

## 2022-03-03 NOTE — Progress Notes (Unsigned)
Subjective: No chief complaint on file.  56 year old male presents the office today for follow-up evaluation of left foot ulceration.  He thinks that it may be doing better he was told he does not like it himself regularly.  He has not seen any increase in swelling or redness.  No drainage or pus.  Denies any fevers or chills.  Objective: AAO x3, NAD DP/PT pulses palpable bilaterally, CRT less than 3 seconds Granular ulceration on the left submetatarsal 5 with mild hyperkeratotic periwound.  Wound appears to be smaller measuring about 0.5 x 0.5 x 0.1 cm without any probing, and or tunneling.  There is no surrounding erythema, ascending cellulitis.  No fluctuation or crepitation.  No malodor. No pain with calf compression, swelling, warmth, erythema   Assessment: Ulceration left foot submetatarsal 5  Plan: -All treatment options discussed with the patient including all alternatives, risks, complications.  -ABI- normal  on 02/21/2022 -Medically necessary wound debridement was performed.  I sharply debrided the wound utilizing a #312 with scalpel and healthy, bleeding, drainage tissue In the wound healing.  Minimal blood loss and hemostasis achieved through manual compression.  Silvadene was applied followed by dressing.  Continue daily dressing changes, offloading. -Finish course of doxycycline -Monitor for any clinical signs or symptoms of infection and directed to call the office immediately should any occur or go to the ER. -Patient encouraged to call the office with any questions, concerns, change in symptoms.

## 2022-03-08 ENCOUNTER — Other Ambulatory Visit: Payer: Self-pay | Admitting: Family Medicine

## 2022-03-24 ENCOUNTER — Ambulatory Visit: Payer: Medicare Other | Admitting: Podiatry

## 2022-03-25 ENCOUNTER — Other Ambulatory Visit: Payer: Self-pay | Admitting: Family Medicine

## 2022-03-25 DIAGNOSIS — E1165 Type 2 diabetes mellitus with hyperglycemia: Secondary | ICD-10-CM

## 2022-03-25 MED ORDER — METFORMIN HCL ER 500 MG PO TB24: 1000.0000 mg | ORAL_TABLET | Freq: Two times a day (BID) | ORAL | 3 refills | Status: DC

## 2022-04-05 ENCOUNTER — Other Ambulatory Visit: Payer: Self-pay | Admitting: Family Medicine

## 2022-04-05 DIAGNOSIS — F5104 Psychophysiologic insomnia: Secondary | ICD-10-CM

## 2022-04-06 ENCOUNTER — Other Ambulatory Visit: Payer: Self-pay | Admitting: Family Medicine

## 2022-04-07 ENCOUNTER — Other Ambulatory Visit: Payer: Self-pay | Admitting: Family Medicine

## 2022-04-07 DIAGNOSIS — F5104 Psychophysiologic insomnia: Secondary | ICD-10-CM

## 2022-04-07 MED ORDER — DIAZEPAM 10 MG PO TABS: ORAL_TABLET | ORAL | 0 refills | Status: DC

## 2022-04-18 ENCOUNTER — Other Ambulatory Visit: Payer: Self-pay | Admitting: Family Medicine

## 2022-04-18 MED ORDER — OXYCODONE HCL 10 MG PO TABS: 5.0000 mg | ORAL_TABLET | Freq: Four times a day (QID) | ORAL | 0 refills | Status: DC | PRN

## 2022-04-18 NOTE — Telephone Encounter (Signed)
Requesting: Oxycodone Contract: none UDS: none Last Visit: 02/14/22 Next Visit: none Last Refill: 01/16/22  Pt also has Norco listed on Med list, Please Advise

## 2022-04-22 ENCOUNTER — Other Ambulatory Visit: Payer: Self-pay | Admitting: Family Medicine

## 2022-04-22 DIAGNOSIS — G2581 Restless legs syndrome: Secondary | ICD-10-CM

## 2022-04-22 MED ORDER — ROPINIROLE HCL 4 MG PO TABS: ORAL_TABLET | ORAL | 2 refills | Status: DC

## 2022-05-05 ENCOUNTER — Other Ambulatory Visit: Payer: Self-pay | Admitting: Family Medicine

## 2022-05-05 MED ORDER — ATORVASTATIN CALCIUM 80 MG PO TABS: ORAL_TABLET | ORAL | 0 refills | Status: DC

## 2022-05-05 NOTE — Addendum Note (Signed)
Addended by: Sharon Seller B on: 05/05/2022 04:11 PM   Modules accepted: Orders

## 2022-05-30 ENCOUNTER — Other Ambulatory Visit: Payer: Self-pay | Admitting: Family Medicine

## 2022-05-30 DIAGNOSIS — F5104 Psychophysiologic insomnia: Secondary | ICD-10-CM

## 2022-06-02 NOTE — Telephone Encounter (Signed)
Last OV---02/14/2022 Last Rf---04/07/2022--#30 no refills

## 2022-06-03 ENCOUNTER — Ambulatory Visit: Payer: Medicare Other | Admitting: Podiatry

## 2022-07-04 ENCOUNTER — Other Ambulatory Visit: Payer: Self-pay | Admitting: Family Medicine

## 2022-07-04 DIAGNOSIS — M1A9XX0 Chronic gout, unspecified, without tophus (tophi): Secondary | ICD-10-CM

## 2022-07-19 ENCOUNTER — Emergency Department (HOSPITAL_BASED_OUTPATIENT_CLINIC_OR_DEPARTMENT_OTHER): Payer: Medicare Other

## 2022-07-19 ENCOUNTER — Emergency Department (HOSPITAL_BASED_OUTPATIENT_CLINIC_OR_DEPARTMENT_OTHER)
Admission: EM | Admit: 2022-07-19 | Discharge: 2022-07-20 | Disposition: A | Discharge status: Home / Self Care | Payer: Medicare Other | Attending: Emergency Medicine | Admitting: Emergency Medicine

## 2022-07-19 ENCOUNTER — Encounter (HOSPITAL_BASED_OUTPATIENT_CLINIC_OR_DEPARTMENT_OTHER): Payer: Self-pay | Admitting: Emergency Medicine

## 2022-07-19 DIAGNOSIS — Z79899 Other long term (current) drug therapy: Secondary | ICD-10-CM | POA: Insufficient documentation

## 2022-07-19 DIAGNOSIS — J209 Acute bronchitis, unspecified: Secondary | ICD-10-CM | POA: Diagnosis not present

## 2022-07-19 DIAGNOSIS — I509 Heart failure, unspecified: Secondary | ICD-10-CM | POA: Diagnosis not present

## 2022-07-19 DIAGNOSIS — L03116 Cellulitis of left lower limb: Secondary | ICD-10-CM | POA: Insufficient documentation

## 2022-07-19 DIAGNOSIS — E11621 Type 2 diabetes mellitus with foot ulcer: Secondary | ICD-10-CM | POA: Insufficient documentation

## 2022-07-19 DIAGNOSIS — Z955 Presence of coronary angioplasty implant and graft: Secondary | ICD-10-CM | POA: Diagnosis not present

## 2022-07-19 DIAGNOSIS — Z96642 Presence of left artificial hip joint: Secondary | ICD-10-CM | POA: Diagnosis not present

## 2022-07-19 DIAGNOSIS — R0602 Shortness of breath: Secondary | ICD-10-CM | POA: Diagnosis present

## 2022-07-19 DIAGNOSIS — I110 Hypertensive heart disease with heart failure: Secondary | ICD-10-CM | POA: Diagnosis not present

## 2022-07-19 DIAGNOSIS — L97521 Non-pressure chronic ulcer of other part of left foot limited to breakdown of skin: Secondary | ICD-10-CM | POA: Insufficient documentation

## 2022-07-19 HISTORY — DX: Local infection of the skin and subcutaneous tissue, unspecified: L08.9

## 2022-07-19 HISTORY — DX: Type 2 diabetes mellitus without complications: E11.9

## 2022-07-19 LAB — CBC WITH DIFFERENTIAL/PLATELET
Abs Immature Granulocytes: 0.02 10*3/uL (ref 0.00–0.07)
Basophils Absolute: 0 10*3/uL (ref 0.0–0.1)
Basophils Relative: 0 %
Eosinophils Absolute: 0.4 10*3/uL (ref 0.0–0.5)
Eosinophils Relative: 5 %
HCT: 33.9 % — ABNORMAL LOW (ref 39.0–52.0)
Hemoglobin: 11 g/dL — ABNORMAL LOW (ref 13.0–17.0)
Immature Granulocytes: 0 %
Lymphocytes Relative: 26 %
Lymphs Abs: 2.2 10*3/uL (ref 0.7–4.0)
MCH: 25.5 pg — ABNORMAL LOW (ref 26.0–34.0)
MCHC: 32.4 g/dL (ref 30.0–36.0)
MCV: 78.5 fL — ABNORMAL LOW (ref 80.0–100.0)
Monocytes Absolute: 0.8 10*3/uL (ref 0.1–1.0)
Monocytes Relative: 10 %
Neutro Abs: 5.1 10*3/uL (ref 1.7–7.7)
Neutrophils Relative %: 59 %
Platelets: 217 10*3/uL (ref 150–400)
RBC: 4.32 MIL/uL (ref 4.22–5.81)
RDW: 15.3 % (ref 11.5–15.5)
WBC: 8.6 10*3/uL (ref 4.0–10.5)
nRBC: 0 % (ref 0.0–0.2)

## 2022-07-19 LAB — COMPREHENSIVE METABOLIC PANEL
ALT: 12 U/L (ref 0–44)
AST: 17 U/L (ref 15–41)
Albumin: 3.2 g/dL — ABNORMAL LOW (ref 3.5–5.0)
Alkaline Phosphatase: 71 U/L (ref 38–126)
Anion gap: 10 (ref 5–15)
BUN: 24 mg/dL — ABNORMAL HIGH (ref 6–20)
CO2: 30 mmol/L (ref 22–32)
Calcium: 8.3 mg/dL — ABNORMAL LOW (ref 8.9–10.3)
Chloride: 95 mmol/L — ABNORMAL LOW (ref 98–111)
Creatinine, Ser: 1.33 mg/dL — ABNORMAL HIGH (ref 0.61–1.24)
GFR, Estimated: >60 mL/min (ref >60)
Glucose, Bld: 203 mg/dL — ABNORMAL HIGH (ref 70–99)
Potassium: 3.6 mmol/L (ref 3.5–5.1)
Sodium: 135 mmol/L (ref 135–145)
Total Bilirubin: 0.5 mg/dL (ref 0.3–1.2)
Total Protein: 7.7 g/dL (ref 6.5–8.1)

## 2022-07-19 LAB — TROPONIN I (HIGH SENSITIVITY): Troponin I (High Sensitivity): 10 ng/L (ref <18)

## 2022-07-19 LAB — BRAIN NATRIURETIC PEPTIDE: B Natriuretic Peptide: 45.7 pg/mL (ref 0.0–100.0)

## 2022-07-19 MED ORDER — ALBUTEROL SULFATE HFA 108 (90 BASE) MCG/ACT IN AERS
2.0000 | INHALATION_SPRAY | RESPIRATORY_TRACT | Status: DC | PRN
  Administered 2022-07-19: 2 via RESPIRATORY_TRACT
  Filled 2022-07-19: qty 6.7
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
## 2022-07-20 ENCOUNTER — Other Ambulatory Visit: Payer: Self-pay | Admitting: Family Medicine

## 2022-07-20 ENCOUNTER — Emergency Department (HOSPITAL_BASED_OUTPATIENT_CLINIC_OR_DEPARTMENT_OTHER): Payer: Medicare Other

## 2022-07-20 DIAGNOSIS — J209 Acute bronchitis, unspecified: Secondary | ICD-10-CM | POA: Diagnosis not present

## 2022-07-20 LAB — TROPONIN I (HIGH SENSITIVITY): Troponin I (High Sensitivity): 10 ng/L (ref <18)

## 2022-07-20 MED ORDER — CEFUROXIME AXETIL 500 MG PO TABS: 500.0000 mg | ORAL_TABLET | Freq: Two times a day (BID) | ORAL | 0 refills | Status: AC

## 2022-07-20 MED ORDER — CEPHALEXIN 250 MG PO CAPS
1000.0000 mg | ORAL_CAPSULE | Freq: Once | ORAL | Status: AC
  Administered 2022-07-20: 1000 mg via ORAL
  Filled 2022-07-20: qty 4

## 2022-07-20 NOTE — ED Notes (Signed)
Wound care completed to L foot. Bandage applied

## 2022-07-21 MED ORDER — OXYCODONE HCL 10 MG PO TABS: 5.0000 mg | ORAL_TABLET | Freq: Four times a day (QID) | ORAL | 0 refills | Status: DC | PRN

## 2022-08-18 ENCOUNTER — Other Ambulatory Visit: Payer: Self-pay | Admitting: Family Medicine

## 2022-08-18 DIAGNOSIS — F5104 Psychophysiologic insomnia: Secondary | ICD-10-CM

## 2022-08-18 NOTE — Telephone Encounter (Signed)
Last OV--02/14/2022 Last RF--06/02/2022--#30 with 1 refill

## 2022-09-29 ENCOUNTER — Other Ambulatory Visit: Payer: Self-pay | Admitting: Family Medicine

## 2022-09-29 MED ORDER — AMLODIPINE BESYLATE 5 MG PO TABS: 5.0000 mg | ORAL_TABLET | Freq: Every day | ORAL | 1 refills | Status: DC

## 2022-10-01 ENCOUNTER — Other Ambulatory Visit: Payer: Self-pay | Admitting: Family Medicine

## 2022-10-01 MED ORDER — FUROSEMIDE 40 MG PO TABS: ORAL_TABLET | ORAL | 3 refills | Status: DC

## 2022-10-16 ENCOUNTER — Other Ambulatory Visit: Payer: Self-pay | Admitting: Family Medicine

## 2022-10-16 MED ORDER — OXYCODONE HCL 10 MG PO TABS: 5.0000 mg | ORAL_TABLET | Freq: Four times a day (QID) | ORAL | 0 refills | Status: DC | PRN

## 2022-10-16 NOTE — Telephone Encounter (Signed)
Requesting: oxycodone Contract: No  UDS: no Last Visit: 02/14/2022 Next Visit: Visit date not found Last Refill: 09/19/22   Please Advise

## 2022-10-16 NOTE — Telephone Encounter (Signed)
Needs appt for any more after this next mo's supply.

## 2022-10-29 ENCOUNTER — Other Ambulatory Visit: Payer: Self-pay | Admitting: Family Medicine

## 2022-11-07 ENCOUNTER — Telehealth: Payer: Self-pay | Admitting: Family Medicine

## 2022-11-07 NOTE — Telephone Encounter (Signed)
Copied from CRM [id number]. Topic: Medicare AWV >> Nov 07, 2022 11:15 AM Payton Doughty wrote: Reason for CRM: Called 11/07/2022 to sched AWV - NO VOICEMAIL  Verlee Rossetti; Care Guide Ambulatory Clinical Support [place] [facility] Direct Dial: [phone]

## 2022-11-11 ENCOUNTER — Encounter: Payer: Self-pay | Admitting: Family Medicine

## 2022-11-11 ENCOUNTER — Encounter: Payer: Medicare Other | Admitting: Family Medicine

## 2022-11-17 ENCOUNTER — Other Ambulatory Visit: Payer: Self-pay | Admitting: Family Medicine

## 2022-11-17 NOTE — Telephone Encounter (Signed)
Requesting: Oxycodone 10mg  Contract: N/A UDS: N/A Last Visit: 02/14/2022 Next Visit: 11/21/2022 Last Refill: 10/16/2022  Please Advise

## 2022-11-18 ENCOUNTER — Encounter: Payer: Self-pay | Admitting: Family Medicine

## 2022-11-21 ENCOUNTER — Encounter: Payer: Self-pay | Admitting: Family Medicine

## 2022-11-21 ENCOUNTER — Other Ambulatory Visit: Payer: Self-pay | Admitting: Family Medicine

## 2022-11-21 ENCOUNTER — Ambulatory Visit: Payer: Medicare Other | Admitting: Family Medicine

## 2022-11-21 VITALS — BP 132/85 | HR 95 | Temp 98.9°F | Ht 69.0 in | Wt 329.5 lb

## 2022-11-21 DIAGNOSIS — S50862A Insect bite (nonvenomous) of left forearm, initial encounter: Secondary | ICD-10-CM

## 2022-11-21 DIAGNOSIS — W57XXXA Bitten or stung by nonvenomous insect and other nonvenomous arthropods, initial encounter: Secondary | ICD-10-CM

## 2022-11-21 DIAGNOSIS — Z1211 Encounter for screening for malignant neoplasm of colon: Secondary | ICD-10-CM

## 2022-11-21 DIAGNOSIS — E1165 Type 2 diabetes mellitus with hyperglycemia: Secondary | ICD-10-CM

## 2022-11-21 DIAGNOSIS — I10 Essential (primary) hypertension: Secondary | ICD-10-CM

## 2022-11-21 DIAGNOSIS — Z79899 Other long term (current) drug therapy: Secondary | ICD-10-CM

## 2022-11-21 DIAGNOSIS — M1611 Unilateral primary osteoarthritis, right hip: Secondary | ICD-10-CM

## 2022-11-21 MED ORDER — OZEMPIC (0.25 OR 0.5 MG/DOSE) 2 MG/1.5ML ~~LOC~~ SOPN: PEN_INJECTOR | SUBCUTANEOUS | 1 refills | Status: AC

## 2022-11-21 MED ORDER — OXYCODONE HCL 10 MG PO TABS: 5.0000 mg | ORAL_TABLET | Freq: Four times a day (QID) | ORAL | 0 refills | Status: DC | PRN

## 2022-11-21 MED ORDER — CEPHALEXIN 500 MG PO CAPS: 500.0000 mg | ORAL_CAPSULE | Freq: Three times a day (TID) | ORAL | 0 refills | Status: DC

## 2022-11-21 NOTE — Patient Instructions (Signed)
Give us 2-3 business days to get the results of your labs back.   Keep the diet clean and stay active.  If you do not hear anything about your referral in the next 1-2 weeks, call our office and ask for an update.  Let us know if you need anything. 
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
## 2022-11-28 ENCOUNTER — Other Ambulatory Visit: Payer: Self-pay | Admitting: Internal Medicine

## 2022-11-28 ENCOUNTER — Other Ambulatory Visit: Payer: Self-pay | Admitting: Adult Health

## 2022-12-19 ENCOUNTER — Other Ambulatory Visit: Payer: Self-pay | Admitting: Family Medicine

## 2022-12-19 DIAGNOSIS — F5104 Psychophysiologic insomnia: Secondary | ICD-10-CM

## 2022-12-19 NOTE — Telephone Encounter (Signed)
Last OV--11/21/22 Last RF---08/19/22--#30 with 2 refills

## 2022-12-28 ENCOUNTER — Other Ambulatory Visit: Payer: Self-pay | Admitting: Family Medicine

## 2022-12-29 ENCOUNTER — Other Ambulatory Visit: Payer: Self-pay | Admitting: Family Medicine

## 2022-12-29 DIAGNOSIS — M1A9XX0 Chronic gout, unspecified, without tophus (tophi): Secondary | ICD-10-CM

## 2022-12-29 MED ORDER — ALLOPURINOL 300 MG PO TABS: 300.0000 mg | ORAL_TABLET | Freq: Two times a day (BID) | ORAL | 1 refills | Status: DC

## 2023-01-18 ENCOUNTER — Telehealth: Payer: Self-pay | Admitting: *Deleted

## 2023-01-18 NOTE — Telephone Encounter (Signed)
Yesi,   This pt is a documented difficult intubation and his procedure will need to be done at the hospital.   Thanks,  Ronnette Rump 

## 2023-01-19 NOTE — Telephone Encounter (Signed)
Dr. Barron Alvine,   Please review the attached message from John. Please advise if the patient needs and OV or direct to the hospital.   Thank you,  Previsit

## 2023-01-19 NOTE — Telephone Encounter (Signed)
OV scheduled.  °

## 2023-01-27 ENCOUNTER — Other Ambulatory Visit: Payer: Self-pay | Admitting: Family Medicine

## 2023-01-27 MED ORDER — ATORVASTATIN CALCIUM 80 MG PO TABS: ORAL_TABLET | ORAL | 0 refills | Status: DC

## 2023-02-11 ENCOUNTER — Encounter: Payer: Medicare Other | Admitting: Gastroenterology

## 2023-02-17 ENCOUNTER — Other Ambulatory Visit: Payer: Self-pay | Admitting: Internal Medicine

## 2023-02-20 ENCOUNTER — Other Ambulatory Visit: Payer: Self-pay | Admitting: *Deleted

## 2023-02-20 MED ORDER — CARVEDILOL 6.25 MG PO TABS: 6.2500 mg | ORAL_TABLET | Freq: Two times a day (BID) | ORAL | 0 refills | Status: DC

## 2023-02-25 ENCOUNTER — Ambulatory Visit: Payer: Medicare Other | Admitting: Family Medicine

## 2023-04-07 ENCOUNTER — Telehealth: Payer: Self-pay | Admitting: Family Medicine

## 2023-04-07 ENCOUNTER — Ambulatory Visit (INDEPENDENT_AMBULATORY_CARE_PROVIDER_SITE_OTHER): Payer: Medicare Other | Admitting: Family Medicine

## 2023-04-07 ENCOUNTER — Encounter: Payer: Self-pay | Admitting: Family Medicine

## 2023-04-07 ENCOUNTER — Other Ambulatory Visit (INDEPENDENT_AMBULATORY_CARE_PROVIDER_SITE_OTHER): Payer: Medicare Other

## 2023-04-07 ENCOUNTER — Telehealth: Payer: Self-pay

## 2023-04-07 VITALS — BP 130/82 | HR 103 | Temp 98.0°F | Resp 16 | Ht 69.0 in | Wt 333.0 lb

## 2023-04-07 DIAGNOSIS — F5104 Psychophysiologic insomnia: Secondary | ICD-10-CM

## 2023-04-07 DIAGNOSIS — Z7985 Long-term (current) use of injectable non-insulin antidiabetic drugs: Secondary | ICD-10-CM

## 2023-04-07 DIAGNOSIS — Z79899 Other long term (current) drug therapy: Secondary | ICD-10-CM

## 2023-04-07 DIAGNOSIS — G2581 Restless legs syndrome: Secondary | ICD-10-CM | POA: Diagnosis not present

## 2023-04-07 DIAGNOSIS — M1611 Unilateral primary osteoarthritis, right hip: Secondary | ICD-10-CM | POA: Diagnosis not present

## 2023-04-07 DIAGNOSIS — E1165 Type 2 diabetes mellitus with hyperglycemia: Secondary | ICD-10-CM

## 2023-04-07 DIAGNOSIS — Z7984 Long term (current) use of oral hypoglycemic drugs: Secondary | ICD-10-CM

## 2023-04-07 DIAGNOSIS — I10 Essential (primary) hypertension: Secondary | ICD-10-CM

## 2023-04-07 LAB — CBC
HCT: 39.2 % (ref 39.0–52.0)
Hemoglobin: 12.9 g/dL — ABNORMAL LOW (ref 13.0–17.0)
MCHC: 32.9 g/dL (ref 30.0–36.0)
MCV: 82.8 fL (ref 78.0–100.0)
Platelets: 228 10*3/uL (ref 150.0–400.0)
RBC: 4.73 Mil/uL (ref 4.22–5.81)
RDW: 16.1 % — ABNORMAL HIGH (ref 11.5–15.5)
WBC: 9.7 10*3/uL (ref 4.0–10.5)

## 2023-04-07 LAB — COMPREHENSIVE METABOLIC PANEL
ALT: 8 U/L (ref 0–53)
AST: 11 U/L (ref 0–37)
Albumin: 4.1 g/dL (ref 3.5–5.2)
Alkaline Phosphatase: 97 U/L (ref 39–117)
BUN: 30 mg/dL — ABNORMAL HIGH (ref 6–23)
CO2: 36 meq/L — ABNORMAL HIGH (ref 19–32)
Calcium: 8.8 mg/dL (ref 8.4–10.5)
Chloride: 97 meq/L (ref 96–112)
Creatinine, Ser: 1.18 mg/dL (ref 0.40–1.50)
GFR: 67.41 mL/min (ref >60.00)
Glucose, Bld: 222 mg/dL — ABNORMAL HIGH (ref 70–99)
Potassium: 4.9 meq/L (ref 3.5–5.1)
Sodium: 139 meq/L (ref 135–145)
Total Bilirubin: 0.6 mg/dL (ref 0.2–1.2)
Total Protein: 7.6 g/dL (ref 6.0–8.3)

## 2023-04-07 LAB — MICROALBUMIN / CREATININE URINE RATIO
Creatinine,U: 46.4 mg/dL
Microalb Creat Ratio: 1.5 mg/g (ref 0.0–30.0)
Microalb, Ur: <0.7 mg/dL (ref 0.0–1.9)

## 2023-04-07 LAB — LIPID PANEL
Cholesterol: 172 mg/dL (ref 0–200)
HDL: 38.9 mg/dL — ABNORMAL LOW (ref >39.00)
LDL Cholesterol: 80 mg/dL (ref 0–99)
NonHDL: 133.19
Total CHOL/HDL Ratio: 4
Triglycerides: 264 mg/dL — ABNORMAL HIGH (ref 0.0–149.0)
VLDL: 52.8 mg/dL — ABNORMAL HIGH (ref 0.0–40.0)

## 2023-04-07 LAB — HEMOGLOBIN A1C: Hgb A1c MFr Bld: 8.8 % — ABNORMAL HIGH (ref 4.6–6.5)

## 2023-04-07 MED ORDER — ROPINIROLE HCL 4 MG PO TABS: ORAL_TABLET | ORAL | 2 refills | Status: DC

## 2023-04-07 MED ORDER — CARVEDILOL 6.25 MG PO TABS: 6.2500 mg | ORAL_TABLET | Freq: Two times a day (BID) | ORAL | 2 refills | Status: DC

## 2023-04-07 MED ORDER — LISINOPRIL 20 MG PO TABS: 20.0000 mg | ORAL_TABLET | Freq: Every day | ORAL | 2 refills | Status: DC

## 2023-04-07 MED ORDER — OXYCODONE HCL 10 MG PO TABS: 5.0000 mg | ORAL_TABLET | Freq: Four times a day (QID) | ORAL | 0 refills | Status: DC | PRN

## 2023-04-07 MED ORDER — OZEMPIC (0.25 OR 0.5 MG/DOSE) 2 MG/1.5ML ~~LOC~~ SOPN: PEN_INJECTOR | SUBCUTANEOUS | Status: AC

## 2023-04-07 MED ORDER — ESZOPICLONE 1 MG PO TABS: 1.0000 mg | ORAL_TABLET | Freq: Every evening | ORAL | 0 refills | Status: DC | PRN

## 2023-04-07 MED ORDER — METFORMIN HCL ER 500 MG PO TB24: 1000.0000 mg | ORAL_TABLET | Freq: Two times a day (BID) | ORAL | 3 refills | Status: DC
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
## 2023-04-07 NOTE — Patient Instructions (Signed)
Give us 2-3 business days to get the results of your labs back.   Keep the diet clean and stay active.  Let us know if you need anything. 

## 2023-04-07 NOTE — Telephone Encounter (Signed)
[facility] called stating pt has Medicaid and cannot accept him as a pt.

## 2023-04-07 NOTE — Telephone Encounter (Signed)
Referral sent to [facility] care.   [facility] Ophthalmology - [facility] [address] [place], [place] [address] Appointments [phone]

## 2023-04-07 NOTE — Telephone Encounter (Signed)
Recheck POC glucose, Hb and cholesterol at next visit

## 2023-04-10 LAB — DRUG MONITORING PANEL 376104, URINE
Amphetamines: NEGATIVE ng/mL (ref <500)
Barbiturates: NEGATIVE ng/mL (ref <300)
Benzodiazepines: NEGATIVE ng/mL (ref <100)
Cocaine Metabolite: NEGATIVE ng/mL (ref <150)
Desmethyltramadol: NEGATIVE ng/mL (ref <100)
Opiates: NEGATIVE ng/mL (ref <100)
Oxycodone: NEGATIVE ng/mL (ref <100)
Tramadol: NEGATIVE ng/mL (ref <100)

## 2023-04-10 LAB — DM TEMPLATE

## 2023-04-13 ENCOUNTER — Other Ambulatory Visit: Payer: Self-pay | Admitting: Family Medicine

## 2023-04-13 MED ORDER — INDOMETHACIN 50 MG PO CAPS: ORAL_CAPSULE | ORAL | 0 refills | Status: DC

## 2023-04-13 NOTE — Telephone Encounter (Signed)
Copied from CRM [id number]. Topic: Clinical - Medication Refill >> Apr 13, 2023 10:40 AM Marica Otter wrote: Most Recent Primary Care Visit:  Provider: [facility]  Department: [facility]  Visit Type: LAB  Date: 04/07/2023  Medication: indomethacin (INDOCIN) 50 MG capsule  Has the patient contacted their pharmacy? Yes, no more refills (Agent: If no, request that the patient contact the pharmacy for the refill. If patient does not wish to contact the pharmacy document the reason why and proceed with request.) (Agent: If yes, when and what did the pharmacy advise?)  Is this the correct pharmacy for this prescription? Yes If no, delete pharmacy and type the correct one.  This is the patient's preferred pharmacy:  [id number] [place], [place] - [address] AT [facility] [address] [place] [address] Phone: [phone] Fax: [phone]    Has the prescription been filled recently? No  Is the patient out of the medication? Yes  Has the patient been seen for an appointment in the last year OR does the patient have an upcoming appointment? Yes  Can we respond through MyChart? No  Agent: Please be advised that Rx refills may take up to 3 business days. We ask that you follow-up with your pharmacy.

## 2023-04-26 ENCOUNTER — Other Ambulatory Visit: Payer: Self-pay | Admitting: Family Medicine

## 2023-04-28 ENCOUNTER — Ambulatory Visit: Payer: Medicare Other | Admitting: Physician Assistant

## 2023-05-04 ENCOUNTER — Other Ambulatory Visit: Payer: Self-pay

## 2023-05-04 DIAGNOSIS — M1A9XX0 Chronic gout, unspecified, without tophus (tophi): Secondary | ICD-10-CM

## 2023-05-05 ENCOUNTER — Other Ambulatory Visit: Payer: Self-pay | Admitting: Family Medicine

## 2023-05-05 DIAGNOSIS — M1611 Unilateral primary osteoarthritis, right hip: Secondary | ICD-10-CM

## 2023-05-05 MED ORDER — OXYCODONE HCL 10 MG PO TABS: 5.0000 mg | ORAL_TABLET | Freq: Four times a day (QID) | ORAL | 0 refills | Status: DC | PRN

## 2023-05-05 NOTE — Telephone Encounter (Signed)
 Requesting: oxycodone  Contract: None UDS: 04/07/23 Last Visit: 04/07/23 Next Visit: 05/15/23 Last Refill: 04/07/23 #75 and 0RF   Please Advise

## 2023-05-15 ENCOUNTER — Ambulatory Visit (INDEPENDENT_AMBULATORY_CARE_PROVIDER_SITE_OTHER): Payer: Medicare Other | Admitting: Family Medicine

## 2023-05-15 ENCOUNTER — Encounter: Payer: Self-pay | Admitting: Family Medicine

## 2023-05-15 VITALS — BP 134/88 | HR 86 | Temp 98.0°F | Resp 16 | Ht 69.0 in | Wt 332.0 lb

## 2023-05-15 DIAGNOSIS — M545 Low back pain, unspecified: Secondary | ICD-10-CM | POA: Diagnosis not present

## 2023-05-15 DIAGNOSIS — E1165 Type 2 diabetes mellitus with hyperglycemia: Secondary | ICD-10-CM

## 2023-05-15 DIAGNOSIS — E782 Mixed hyperlipidemia: Secondary | ICD-10-CM | POA: Insufficient documentation

## 2023-05-15 DIAGNOSIS — G4700 Insomnia, unspecified: Secondary | ICD-10-CM

## 2023-05-15 DIAGNOSIS — Z7985 Long-term (current) use of injectable non-insulin antidiabetic drugs: Secondary | ICD-10-CM

## 2023-05-15 DIAGNOSIS — Z1211 Encounter for screening for malignant neoplasm of colon: Secondary | ICD-10-CM

## 2023-05-15 DIAGNOSIS — Z7984 Long term (current) use of oral hypoglycemic drugs: Secondary | ICD-10-CM

## 2023-05-15 DIAGNOSIS — G2581 Restless legs syndrome: Secondary | ICD-10-CM | POA: Diagnosis not present

## 2023-05-15 MED ORDER — TIZANIDINE HCL 4 MG PO TABS: 4.0000 mg | ORAL_TABLET | Freq: Two times a day (BID) | ORAL | 0 refills | Status: DC | PRN

## 2023-05-15 MED ORDER — PRAMIPEXOLE DIHYDROCHLORIDE 0.125 MG PO TABS: ORAL_TABLET | ORAL | 2 refills | Status: DC
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
## 2023-05-15 NOTE — Patient Instructions (Addendum)
Keep the diet clean and stay active.  Aim to do some physical exertion for 150 minutes per week. This is typically divided into 5 days per week, 30 minutes per day. The activity should be enough to get your heart rate up. Anything is better than nothing if you have time constraints.  Ice/cold pack over area for 10-15 min twice daily.  Heat (pad or rice pillow in microwave) over affected area, 10-15 minutes twice daily.   OK to take Tylenol 1000 mg (2 extra strength tabs) or 975 mg (3 regular strength tabs) every 6 hours as needed.  Let us know if you need anything.  EXERCISES  RANGE OF MOTION (ROM) AND STRETCHING EXERCISES - Low Back Pain Most people with lower back pain will find that their symptoms get worse with excessive bending forward (flexion) or arching at the lower back (extension). The exercises that will help resolve your symptoms will focus on the opposite motion.  If you have pain, numbness or tingling which travels down into your buttocks, leg or foot, the goal of the therapy is for these symptoms to move closer to your back and eventually resolve. Sometimes, these leg symptoms will get better, but your lower back pain may worsen. This is often an indication of progress in your rehabilitation. Be very alert to any changes in your symptoms and the activities in which you participated in the 24 hours prior to the change. Sharing this information with your caregiver will allow him or her to most efficiently treat your condition. These exercises may help you when beginning to rehabilitate your injury. Your symptoms may resolve with or without further involvement from your physician, physical therapist or athletic trainer. While completing these exercises, remember:  Restoring tissue flexibility helps normal motion to return to the joints. This allows healthier, less painful movement and activity. An effective stretch should be held for at least 30 seconds. A stretch should never be  painful. You should only feel a gentle lengthening or release in the stretched tissue. FLEXION RANGE OF MOTION AND STRETCHING EXERCISES:  STRETCH - Flexion, Single Knee to Chest  Lie on a firm bed or floor with both legs extended in front of you. Keeping one leg in contact with the floor, bring your opposite knee to your chest. Hold your leg in place by either grabbing behind your thigh or at your knee. Pull until you feel a gentle stretch in your low back. Hold 30 seconds. Slowly release your grasp and repeat the exercise with the opposite side. Repeat 2 times. Complete this exercise 3 times per week.   STRETCH - Flexion, Double Knee to Chest Lie on a firm bed or floor with both legs extended in front of you. Keeping one leg in contact with the floor, bring your opposite knee to your chest. Tense your stomach muscles to support your back and then lift your other knee to your chest. Hold your legs in place by either grabbing behind your thighs or at your knees. Pull both knees toward your chest until you feel a gentle stretch in your low back. Hold 30 seconds. Tense your stomach muscles and slowly return one leg at a time to the floor. Repeat 2 times. Complete this exercise 3 times per week.   STRETCH - Low Trunk Rotation Lie on a firm bed or floor. Keeping your legs in front of you, bend your knees so they are both pointed toward the ceiling and your feet are flat on the floor. Extend  your arms out to the side. This will stabilize your upper body by keeping your shoulders in contact with the floor. Gently and slowly drop both knees together to one side until you feel a gentle stretch in your low back. Hold for 30 seconds. Tense your stomach muscles to support your lower back as you bring your knees back to the starting position. Repeat the exercise to the other side. Repeat 2 times. Complete this exercise at least 3 times per week.   EXTENSION RANGE OF MOTION AND FLEXIBILITY  EXERCISES:  STRETCH - Extension, Prone on Elbows  Lie on your stomach on the floor, a bed will be too soft. Place your palms about shoulder width apart and at the height of your head. Place your elbows under your shoulders. If this is too painful, stack pillows under your chest. Allow your body to relax so that your hips drop lower and make contact more completely with the floor. Hold this position for 30 seconds. Slowly return to lying flat on the floor. Repeat 2 times. Complete this exercise 3 times per week.   RANGE OF MOTION - Extension, Prone Press Ups Lie on your stomach on the floor, a bed will be too soft. Place your palms about shoulder width apart and at the height of your head. Keeping your back as relaxed as possible, slowly straighten your elbows while keeping your hips on the floor. You may adjust the placement of your hands to maximize your comfort. As you gain motion, your hands will come more underneath your shoulders. Hold this position 30 seconds. Slowly return to lying flat on the floor. Repeat 2 times. Complete this exercise 3 times per week.   RANGE OF MOTION- Quadruped, Neutral Spine  Assume a hands and knees position on a firm surface. Keep your hands under your shoulders and your knees under your hips. You may place padding under your knees for comfort. Drop your head and point your tailbone toward the ground below you. This will round out your lower back like an angry cat. Hold this position for 30 seconds. Slowly lift your head and release your tail bone so that your back sags into a large arch, like an old horse. Hold this position for 30 seconds. Repeat this until you feel limber in your low back. Now, find your "sweet spot." This will be the most comfortable position somewhere between the two previous positions. This is your neutral spine. Once you have found this position, tense your stomach muscles to support your low back. Hold this position for 30  seconds. Repeat 2 times. Complete this exercise 3 times per week.   STRENGTHENING EXERCISES - Low Back Sprain These exercises may help you when beginning to rehabilitate your injury. These exercises should be done near your "sweet spot." This is the neutral, low-back arch, somewhere between fully rounded and fully arched, that is your least painful position. When performed in this safe range of motion, these exercises can be used for people who have either a flexion or extension based injury. These exercises may resolve your symptoms with or without further involvement from your physician, physical therapist or athletic trainer. While completing these exercises, remember:  Muscles can gain both the endurance and the strength needed for everyday activities through controlled exercises. Complete these exercises as instructed by your physician, physical therapist or athletic trainer. Increase the resistance and repetitions only as guided. You may experience muscle soreness or fatigue, but the pain or discomfort you are trying to  eliminate should never worsen during these exercises. If this pain does worsen, stop and make certain you are following the directions exactly. If the pain is still present after adjustments, discontinue the exercise until you can discuss the trouble with your caregiver.  STRENGTHENING - Deep Abdominals, Pelvic Tilt  Lie on a firm bed or floor. Keeping your legs in front of you, bend your knees so they are both pointed toward the ceiling and your feet are flat on the floor. Tense your lower abdominal muscles to press your low back into the floor. This motion will rotate your pelvis so that your tail bone is scooping upwards rather than pointing at your feet or into the floor. With a gentle tension and even breathing, hold this position for 3 seconds. Repeat 2 times. Complete this exercise 3 times per week.   STRENGTHENING - Abdominals, Crunches  Lie on a firm bed or floor.  Keeping your legs in front of you, bend your knees so they are both pointed toward the ceiling and your feet are flat on the floor. Cross your arms over your chest. Slightly tip your chin down without bending your neck. Tense your abdominals and slowly lift your trunk high enough to just clear your shoulder blades. Lifting higher can put excessive stress on the lower back and does not further strengthen your abdominal muscles. Control your return to the starting position. Repeat 2 times. Complete this exercise 3 times per week.   STRENGTHENING - Quadruped, Opposite UE/LE Lift  Assume a hands and knees position on a firm surface. Keep your hands under your shoulders and your knees under your hips. You may place padding under your knees for comfort. Find your neutral spine and gently tense your abdominal muscles so that you can maintain this position. Your shoulders and hips should form a rectangle that is parallel with the floor and is not twisted. Keeping your trunk steady, lift your right hand no higher than your shoulder and then your left leg no higher than your hip. Make sure you are not holding your breath. Hold this position for 30 seconds. Continuing to keep your abdominal muscles tense and your back steady, slowly return to your starting position. Repeat with the opposite arm and leg. Repeat 2 times. Complete this exercise 3 times per week.   STRENGTHENING - Abdominals and Quadriceps, Straight Leg Raise  Lie on a firm bed or floor with both legs extended in front of you. Keeping one leg in contact with the floor, bend the other knee so that your foot can rest flat on the floor. Find your neutral spine, and tense your abdominal muscles to maintain your spinal position throughout the exercise. Slowly lift your straight leg off the floor about 6 inches for a count of 3, making sure to not hold your breath. Still keeping your neutral spine, slowly lower your leg all the way to the  floor. Repeat this exercise with each leg 2 times. Complete this exercise 3 times per week.  POSTURE AND BODY MECHANICS CONSIDERATIONS - Low Back Sprain Keeping correct posture when sitting, standing or completing your activities will reduce the stress put on different body tissues, allowing injured tissues a chance to heal and limiting painful experiences. The following are general guidelines for improved posture.  While reading these guidelines, remember: The exercises prescribed by your provider will help you have the flexibility and strength to maintain correct postures. The correct posture provides the best environment for your joints to work. All  of your joints have less wear and tear when properly supported by a spine with good posture. This means you will experience a healthier, less painful body. Correct posture must be practiced with all of your activities, especially prolonged sitting and standing. Correct posture is as important when doing repetitive low-stress activities (typing) as it is when doing a single heavy-load activity (lifting).  RESTING POSITIONS Consider which positions are most painful for you when choosing a resting position. If you have pain with flexion-based activities (sitting, bending, stooping, squatting), choose a position that allows you to rest in a less flexed posture. You would want to avoid curling into a fetal position on your side. If your pain worsens with extension-based activities (prolonged standing, working overhead), avoid resting in an extended position such as sleeping on your stomach. Most people will find more comfort when they rest with their spine in a more neutral position, neither too rounded nor too arched. Lying on a non-sagging bed on your side with a pillow between your knees, or on your back with a pillow under your knees will often provide some relief. Keep in mind, being in any one position for a prolonged period of time, no matter how correct  your posture, can still lead to stiffness.  PROPER SITTING POSTURE In order to minimize stress and discomfort on your spine, you must sit with correct posture. Sitting with good posture should be effortless for a healthy body. Returning to good posture is a gradual process. Many people can work toward this most comfortably by using various supports until they have the flexibility and strength to maintain this posture on their own. When sitting with proper posture, your ears will fall over your shoulders and your shoulders will fall over your hips. You should use the back of the chair to support your upper back. Your lower back will be in a neutral position, just slightly arched. You may place a small pillow or folded towel at the base of your lower back for  support.  When working at a desk, create an environment that supports good, upright posture. Without extra support, muscles tire, which leads to excessive strain on joints and other tissues. Keep these recommendations in mind:  CHAIR: A chair should be able to slide under your desk when your back makes contact with the back of the chair. This allows you to work closely. The chair's height should allow your eyes to be level with the upper part of your monitor and your hands to be slightly lower than your elbows.  BODY POSITION Your feet should make contact with the floor. If this is not possible, use a foot rest. Keep your ears over your shoulders. This will reduce stress on your neck and low back.  INCORRECT SITTING POSTURES  If you are feeling tired and unable to assume a healthy sitting posture, do not slouch or slump. This puts excessive strain on your back tissues, causing more damage and pain. Healthier options include: Using more support, like a lumbar pillow. Switching tasks to something that requires you to be upright or walking. Talking a brief walk. Lying down to rest in a neutral-spine position.  PROLONGED STANDING WHILE  SLIGHTLY LEANING FORWARD  When completing a task that requires you to lean forward while standing in one place for a long time, place either foot up on a stationary 2-4 inch high object to help maintain the best posture. When both feet are on the ground, the lower back tends to lose  its slight inward curve. If this curve flattens (or becomes too large), then the back and your other joints will experience too much stress, tire more quickly, and can cause pain.  CORRECT STANDING POSTURES Proper standing posture should be assumed with all daily activities, even if they only take a few moments, like when brushing your teeth. As in sitting, your ears should fall over your shoulders and your shoulders should fall over your hips. You should keep a slight tension in your abdominal muscles to brace your spine. Your tailbone should point down to the ground, not behind your body, resulting in an over-extended swayback posture.   INCORRECT STANDING POSTURES  Common incorrect standing postures include a forward head, locked knees and/or an excessive swayback. WALKING Walk with an upright posture. Your ears, shoulders and hips should all line-up.  PROLONGED ACTIVITY IN A FLEXED POSITION When completing a task that requires you to bend forward at your waist or lean over a low surface, try to find a way to stabilize 3 out of 4 of your limbs. You can place a hand or elbow on your thigh or rest a knee on the surface you are reaching across. This will provide you more stability, so that your muscles do not tire as quickly. By keeping your knees relaxed, or slightly bent, you will also reduce stress across your lower back. CORRECT LIFTING TECHNIQUES  DO : Assume a wide stance. This will provide you more stability and the opportunity to get as close as possible to the object which you are lifting. Tense your abdominals to brace your spine. Bend at the knees and hips. Keeping your back locked in a neutral-spine position,  lift using your leg muscles. Lift with your legs, keeping your back straight. Test the weight of unknown objects before attempting to lift them. Try to keep your elbows locked down at your sides in order get the best strength from your shoulders when carrying an object.   Always ask for help when lifting heavy or awkward objects. INCORRECT LIFTING TECHNIQUES DO NOT:  Lock your knees when lifting, even if it is a small object. Bend and twist. Pivot at your feet or move your feet when needing to change directions. Assume that you can safely pick up even a paperclip without proper posture.

## 2023-05-25 ENCOUNTER — Ambulatory Visit (INDEPENDENT_AMBULATORY_CARE_PROVIDER_SITE_OTHER): Payer: Medicare Other

## 2023-05-25 ENCOUNTER — Ambulatory Visit (INDEPENDENT_AMBULATORY_CARE_PROVIDER_SITE_OTHER): Payer: Medicare Other | Admitting: Podiatry

## 2023-05-25 ENCOUNTER — Encounter: Payer: Self-pay | Admitting: Podiatry

## 2023-05-25 DIAGNOSIS — M79672 Pain in left foot: Secondary | ICD-10-CM

## 2023-05-25 DIAGNOSIS — L97522 Non-pressure chronic ulcer of other part of left foot with fat layer exposed: Secondary | ICD-10-CM

## 2023-05-25 DIAGNOSIS — I739 Peripheral vascular disease, unspecified: Secondary | ICD-10-CM

## 2023-05-25 MED ORDER — DOXYCYCLINE HYCLATE 100 MG PO TABS: 100.0000 mg | ORAL_TABLET | Freq: Two times a day (BID) | ORAL | 0 refills | Status: DC

## 2023-05-25 MED ORDER — GENTAMICIN SULFATE 0.1 % EX OINT: 1.0000 | TOPICAL_OINTMENT | Freq: Three times a day (TID) | CUTANEOUS | 0 refills | Status: DC

## 2023-05-25 NOTE — Patient Instructions (Signed)
Left foot with soap and water, dry thoroughly.  Apply the gentamicin antibiotic ointment to the wound.  Change twice a day.  Monitor for any signs/symptoms of infection. Call the office immediately if any occur or go directly to the emergency room. Call with any questions/concerns.
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
## 2023-05-26 ENCOUNTER — Other Ambulatory Visit: Payer: Self-pay | Admitting: Family Medicine

## 2023-05-27 ENCOUNTER — Ambulatory Visit: Payer: Medicare Other | Admitting: Pulmonary Disease

## 2023-05-27 ENCOUNTER — Encounter: Payer: Self-pay | Admitting: Pulmonary Disease

## 2023-06-03 ENCOUNTER — Ambulatory Visit (HOSPITAL_COMMUNITY)
Admission: RE | Admit: 2023-06-03 | Discharge: 2023-06-03 | Disposition: A | Discharge status: Home / Self Care | Payer: Medicare Other | Source: Ambulatory Visit | Attending: Podiatry | Admitting: Podiatry

## 2023-06-03 ENCOUNTER — Encounter: Payer: Self-pay | Admitting: Podiatry

## 2023-06-03 DIAGNOSIS — I739 Peripheral vascular disease, unspecified: Secondary | ICD-10-CM | POA: Insufficient documentation

## 2023-06-03 LAB — VAS US ABI WITH/WO TBI

## 2023-06-05 LAB — HM DIABETES EYE EXAM

## 2023-06-08 ENCOUNTER — Ambulatory Visit (INDEPENDENT_AMBULATORY_CARE_PROVIDER_SITE_OTHER): Payer: Medicare Other | Admitting: Podiatry

## 2023-06-08 VITALS — Temp 97.9°F

## 2023-06-08 DIAGNOSIS — L03116 Cellulitis of left lower limb: Secondary | ICD-10-CM | POA: Diagnosis not present

## 2023-06-08 DIAGNOSIS — L97522 Non-pressure chronic ulcer of other part of left foot with fat layer exposed: Secondary | ICD-10-CM

## 2023-06-08 MED ORDER — CEPHALEXIN 500 MG PO CAPS: 500.0000 mg | ORAL_CAPSULE | Freq: Four times a day (QID) | ORAL | 0 refills | Status: DC

## 2023-06-08 NOTE — Progress Notes (Signed)
Subjective:   Patient ID: Justin Hall, male   DOB: 58 y.o.   MRN: [id number]   HPI Chief Complaint  Patient presents with   Foot Ulcer    RM#12 Follow up left foot states surgical shoe would not stay on so he has just stayed off the foot in pain 5 out of 10 no redness to ulcer area just to the leg still on antibiotics.    58 year old male presents the office with above concerns.  Since the wound is doing better but still still concerned of the swelling and redness to the left leg.  Does not report any fevers or chills.  She is continue with daily dressing changes.  Not able to tolerate the surgical shoe as his ankle is rolling so he is been staying off the foot is much as possible.  Last A1c was 8.8 on 04/07/2023  Objective:  Physical Exam  General: AAO x3, NAD  Dermatological: On the left foot submetatarsal 5.  After debridement dried blood present upon debridement the wound is smaller measuring 0.3 x 0.2 x 0.1 cm without any probing to bone, and or tunneling.  There is no ascending erythema to this area.  There is no change approximately fluctuation, crepitation, malodor.  There is still some edema and erythema to the anterior aspect of the left leg.  There is no open lesion on the leg and there is no fluctuation of the leg or crepitus.    Vascular: Dorsalis Pedis artery and Posterior Tibial artery pedal pulses are palpable bilateral with immedate capillary fill time.  There is no pain with calf compression, swelling, warmth, erythema.   Neruologic: Sensation decreased  Musculoskeletal: No pain on exam.     Assessment:   Ulceration left foot     Plan:  Ulceration left foot -Medically necessary wound debridements performed today.  Sharply debrided ulceration #312 blade scalpel.  I debrided the wound down to healthy, granular wound base remove nonviable, devitalized tissue in order to promote wound healing.  Not able to measure wound prior debridement as it was covered calcium)  measurements are noted above.  There is no significant blood loss.  Hemostasis achieved to medial compression.  Antibiotic ointment was applied followed by dressing. -Dispensed cam boot for immobilization to help facilitate healing -Continue gentamicin cream -ABI reviewed  Left leg cellulitis -Given the redness in the leg will switch antibiotics and changed to Keflex today.  Monitor for any clinical signs or symptoms of infection and directed to call the office immediately should any occur or go to the ER.  Return in about 10 days (around 06/18/2023).  Vivi Barrack DPM

## 2023-06-18 ENCOUNTER — Ambulatory Visit: Payer: Medicare Other | Admitting: Podiatry

## 2023-07-01 ENCOUNTER — Other Ambulatory Visit: Payer: Self-pay | Admitting: Family Medicine

## 2023-07-01 DIAGNOSIS — F5104 Psychophysiologic insomnia: Secondary | ICD-10-CM

## 2023-07-12 ENCOUNTER — Other Ambulatory Visit: Payer: Self-pay | Admitting: Family Medicine

## 2023-07-28 ENCOUNTER — Encounter: Payer: Self-pay | Admitting: Family Medicine

## 2023-07-28 ENCOUNTER — Ambulatory Visit (INDEPENDENT_AMBULATORY_CARE_PROVIDER_SITE_OTHER): Admitting: Family Medicine

## 2023-07-28 VITALS — BP 138/82 | HR 90 | Temp 98.6°F | Ht 69.0 in | Wt 328.8 lb

## 2023-07-28 DIAGNOSIS — M1611 Unilateral primary osteoarthritis, right hip: Secondary | ICD-10-CM | POA: Diagnosis not present

## 2023-07-28 DIAGNOSIS — Z7985 Long-term (current) use of injectable non-insulin antidiabetic drugs: Secondary | ICD-10-CM | POA: Diagnosis not present

## 2023-07-28 DIAGNOSIS — E1165 Type 2 diabetes mellitus with hyperglycemia: Secondary | ICD-10-CM

## 2023-07-28 DIAGNOSIS — L03119 Cellulitis of unspecified part of limb: Secondary | ICD-10-CM

## 2023-07-28 MED ORDER — SILDENAFIL CITRATE 100 MG PO TABS: 50.0000 mg | ORAL_TABLET | Freq: Every day | ORAL | 2 refills | Status: DC | PRN

## 2023-07-28 MED ORDER — TIRZEPATIDE 12.5 MG/0.5ML ~~LOC~~ SOAJ: 12.5000 mg | SUBCUTANEOUS | 0 refills | Status: DC

## 2023-07-28 MED ORDER — TIRZEPATIDE 7.5 MG/0.5ML ~~LOC~~ SOAJ: 7.5000 mg | SUBCUTANEOUS | 0 refills | Status: DC

## 2023-07-28 MED ORDER — TIRZEPATIDE 10 MG/0.5ML ~~LOC~~ SOAJ: 10.0000 mg | SUBCUTANEOUS | 0 refills | Status: DC

## 2023-07-28 MED ORDER — DOXYCYCLINE HYCLATE 100 MG PO TABS: 100.0000 mg | ORAL_TABLET | Freq: Two times a day (BID) | ORAL | 0 refills | Status: DC

## 2023-07-28 MED ORDER — OXYCODONE HCL 10 MG PO TABS
5.0000 mg | ORAL_TABLET | Freq: Four times a day (QID) | ORAL | 0 refills | Status: DC | PRN
Start: 2023-07-28 — End: 2023-08-17

## 2023-07-28 MED ORDER — TIRZEPATIDE 15 MG/0.5ML ~~LOC~~ SOAJ: 15.0000 mg | SUBCUTANEOUS | 1 refills | Status: DC

## 2023-07-28 MED ORDER — OXYCODONE HCL 10 MG PO TABS: 5.0000 mg | ORAL_TABLET | Freq: Four times a day (QID) | ORAL | 0 refills | Status: DC | PRN

## 2023-07-28 MED ORDER — METHOCARBAMOL 500 MG PO TABS: 500.0000 mg | ORAL_TABLET | Freq: Two times a day (BID) | ORAL | 2 refills | Status: DC

## 2023-07-28 NOTE — Patient Instructions (Signed)
 Keep the diet clean and stay active.  Let me know if there are cost issues with your medicine.   Let us know if you need anything.
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
## 2023-08-02 ENCOUNTER — Encounter: Payer: Self-pay | Admitting: Family Medicine

## 2023-08-04 ENCOUNTER — Other Ambulatory Visit: Payer: Self-pay | Admitting: Family Medicine

## 2023-08-04 MED ORDER — DOXYCYCLINE HYCLATE 100 MG PO TABS: 100.0000 mg | ORAL_TABLET | Freq: Two times a day (BID) | ORAL | 0 refills | Status: DC

## 2023-08-09 ENCOUNTER — Encounter: Payer: Self-pay | Admitting: Podiatry

## 2023-08-10 ENCOUNTER — Inpatient Hospital Stay (HOSPITAL_BASED_OUTPATIENT_CLINIC_OR_DEPARTMENT_OTHER)
Admission: EM | Admit: 2023-08-10 | Discharge: 2023-08-17 | DRG: 617 | Disposition: A | Discharge status: Home Health | Attending: Family Medicine | Admitting: Family Medicine

## 2023-08-10 ENCOUNTER — Encounter: Payer: Self-pay | Admitting: Family Medicine

## 2023-08-10 ENCOUNTER — Other Ambulatory Visit: Payer: Self-pay

## 2023-08-10 ENCOUNTER — Inpatient Hospital Stay (HOSPITAL_COMMUNITY)

## 2023-08-10 ENCOUNTER — Encounter (HOSPITAL_BASED_OUTPATIENT_CLINIC_OR_DEPARTMENT_OTHER): Payer: Self-pay | Admitting: Emergency Medicine

## 2023-08-10 ENCOUNTER — Encounter: Payer: Self-pay | Admitting: Podiatry

## 2023-08-10 ENCOUNTER — Emergency Department (HOSPITAL_BASED_OUTPATIENT_CLINIC_OR_DEPARTMENT_OTHER)

## 2023-08-10 DIAGNOSIS — E11621 Type 2 diabetes mellitus with foot ulcer: Secondary | ICD-10-CM | POA: Diagnosis present

## 2023-08-10 DIAGNOSIS — M869 Osteomyelitis, unspecified: Secondary | ICD-10-CM | POA: Diagnosis not present

## 2023-08-10 DIAGNOSIS — E785 Hyperlipidemia, unspecified: Secondary | ICD-10-CM | POA: Diagnosis present

## 2023-08-10 DIAGNOSIS — L03116 Cellulitis of left lower limb: Secondary | ICD-10-CM | POA: Diagnosis present

## 2023-08-10 DIAGNOSIS — E66813 Obesity, class 3: Secondary | ICD-10-CM | POA: Diagnosis present

## 2023-08-10 DIAGNOSIS — Z6841 Body Mass Index (BMI) 40.0 and over, adult: Secondary | ICD-10-CM | POA: Diagnosis not present

## 2023-08-10 DIAGNOSIS — G4733 Obstructive sleep apnea (adult) (pediatric): Secondary | ICD-10-CM | POA: Diagnosis present

## 2023-08-10 DIAGNOSIS — I2510 Atherosclerotic heart disease of native coronary artery without angina pectoris: Secondary | ICD-10-CM | POA: Diagnosis present

## 2023-08-10 DIAGNOSIS — L97528 Non-pressure chronic ulcer of other part of left foot with other specified severity: Secondary | ICD-10-CM | POA: Diagnosis present

## 2023-08-10 DIAGNOSIS — R52 Pain, unspecified: Secondary | ICD-10-CM

## 2023-08-10 DIAGNOSIS — Y92231 Patient bathroom in hospital as the place of occurrence of the external cause: Secondary | ICD-10-CM | POA: Diagnosis not present

## 2023-08-10 DIAGNOSIS — Z7985 Long-term (current) use of injectable non-insulin antidiabetic drugs: Secondary | ICD-10-CM

## 2023-08-10 DIAGNOSIS — E1169 Type 2 diabetes mellitus with other specified complication: Secondary | ICD-10-CM | POA: Diagnosis present

## 2023-08-10 DIAGNOSIS — B957 Other staphylococcus as the cause of diseases classified elsewhere: Secondary | ICD-10-CM | POA: Diagnosis present

## 2023-08-10 DIAGNOSIS — B951 Streptococcus, group B, as the cause of diseases classified elsewhere: Secondary | ICD-10-CM | POA: Diagnosis present

## 2023-08-10 DIAGNOSIS — M86172 Other acute osteomyelitis, left ankle and foot: Secondary | ICD-10-CM | POA: Diagnosis not present

## 2023-08-10 DIAGNOSIS — M2012 Hallux valgus (acquired), left foot: Secondary | ICD-10-CM | POA: Diagnosis present

## 2023-08-10 DIAGNOSIS — E1140 Type 2 diabetes mellitus with diabetic neuropathy, unspecified: Secondary | ICD-10-CM | POA: Diagnosis present

## 2023-08-10 DIAGNOSIS — L97529 Non-pressure chronic ulcer of other part of left foot with unspecified severity: Secondary | ICD-10-CM | POA: Diagnosis not present

## 2023-08-10 DIAGNOSIS — I5032 Chronic diastolic (congestive) heart failure: Secondary | ICD-10-CM | POA: Diagnosis present

## 2023-08-10 DIAGNOSIS — M86672 Other chronic osteomyelitis, left ankle and foot: Secondary | ICD-10-CM | POA: Diagnosis present

## 2023-08-10 DIAGNOSIS — M1612 Unilateral primary osteoarthritis, left hip: Secondary | ICD-10-CM | POA: Diagnosis present

## 2023-08-10 DIAGNOSIS — E1165 Type 2 diabetes mellitus with hyperglycemia: Secondary | ICD-10-CM | POA: Diagnosis present

## 2023-08-10 DIAGNOSIS — Z22322 Carrier or suspected carrier of Methicillin resistant Staphylococcus aureus: Secondary | ICD-10-CM

## 2023-08-10 DIAGNOSIS — M170 Bilateral primary osteoarthritis of knee: Secondary | ICD-10-CM | POA: Diagnosis present

## 2023-08-10 DIAGNOSIS — Z8781 Personal history of (healed) traumatic fracture: Secondary | ICD-10-CM

## 2023-08-10 DIAGNOSIS — R0602 Shortness of breath: Secondary | ICD-10-CM | POA: Diagnosis not present

## 2023-08-10 DIAGNOSIS — Z809 Family history of malignant neoplasm, unspecified: Secondary | ICD-10-CM

## 2023-08-10 DIAGNOSIS — I130 Hypertensive heart and chronic kidney disease with heart failure and stage 1 through stage 4 chronic kidney disease, or unspecified chronic kidney disease: Secondary | ICD-10-CM | POA: Diagnosis present

## 2023-08-10 DIAGNOSIS — M86072 Acute hematogenous osteomyelitis, left ankle and foot: Principal | ICD-10-CM

## 2023-08-10 DIAGNOSIS — Z794 Long term (current) use of insulin: Secondary | ICD-10-CM | POA: Diagnosis not present

## 2023-08-10 DIAGNOSIS — F5105 Insomnia due to other mental disorder: Secondary | ICD-10-CM | POA: Diagnosis present

## 2023-08-10 DIAGNOSIS — Z8261 Family history of arthritis: Secondary | ICD-10-CM

## 2023-08-10 DIAGNOSIS — Z96642 Presence of left artificial hip joint: Secondary | ICD-10-CM | POA: Diagnosis present

## 2023-08-10 DIAGNOSIS — E11628 Type 2 diabetes mellitus with other skin complications: Principal | ICD-10-CM | POA: Diagnosis present

## 2023-08-10 DIAGNOSIS — M1A9XX0 Chronic gout, unspecified, without tophus (tophi): Secondary | ICD-10-CM | POA: Diagnosis present

## 2023-08-10 DIAGNOSIS — F419 Anxiety disorder, unspecified: Secondary | ICD-10-CM | POA: Diagnosis present

## 2023-08-10 DIAGNOSIS — W19XXXA Unspecified fall, initial encounter: Secondary | ICD-10-CM | POA: Diagnosis not present

## 2023-08-10 DIAGNOSIS — N181 Chronic kidney disease, stage 1: Secondary | ICD-10-CM | POA: Diagnosis present

## 2023-08-10 DIAGNOSIS — F32A Depression, unspecified: Secondary | ICD-10-CM | POA: Diagnosis present

## 2023-08-10 DIAGNOSIS — Z9089 Acquired absence of other organs: Secondary | ICD-10-CM

## 2023-08-10 DIAGNOSIS — Z8614 Personal history of Methicillin resistant Staphylococcus aureus infection: Secondary | ICD-10-CM | POA: Diagnosis not present

## 2023-08-10 DIAGNOSIS — M86372 Chronic multifocal osteomyelitis, left ankle and foot: Secondary | ICD-10-CM

## 2023-08-10 DIAGNOSIS — E1122 Type 2 diabetes mellitus with diabetic chronic kidney disease: Secondary | ICD-10-CM | POA: Diagnosis present

## 2023-08-10 DIAGNOSIS — Z8249 Family history of ischemic heart disease and other diseases of the circulatory system: Secondary | ICD-10-CM

## 2023-08-10 DIAGNOSIS — G4730 Sleep apnea, unspecified: Secondary | ICD-10-CM | POA: Diagnosis not present

## 2023-08-10 DIAGNOSIS — Z79899 Other long term (current) drug therapy: Secondary | ICD-10-CM

## 2023-08-10 DIAGNOSIS — N179 Acute kidney failure, unspecified: Secondary | ICD-10-CM | POA: Diagnosis present

## 2023-08-10 DIAGNOSIS — Z7984 Long term (current) use of oral hypoglycemic drugs: Secondary | ICD-10-CM

## 2023-08-10 DIAGNOSIS — E782 Mixed hyperlipidemia: Secondary | ICD-10-CM | POA: Diagnosis not present

## 2023-08-10 DIAGNOSIS — G2581 Restless legs syndrome: Secondary | ICD-10-CM | POA: Diagnosis present

## 2023-08-10 DIAGNOSIS — L089 Local infection of the skin and subcutaneous tissue, unspecified: Secondary | ICD-10-CM | POA: Diagnosis not present

## 2023-08-10 DIAGNOSIS — R609 Edema, unspecified: Secondary | ICD-10-CM | POA: Diagnosis not present

## 2023-08-10 DIAGNOSIS — Z9889 Other specified postprocedural states: Secondary | ICD-10-CM

## 2023-08-10 DIAGNOSIS — Z833 Family history of diabetes mellitus: Secondary | ICD-10-CM

## 2023-08-10 LAB — CBC WITH DIFFERENTIAL/PLATELET
Abs Immature Granulocytes: 0.04 10*3/uL (ref 0.00–0.07)
Basophils Absolute: 0 10*3/uL (ref 0.0–0.1)
Basophils Relative: 0 %
Eosinophils Absolute: 0.4 10*3/uL (ref 0.0–0.5)
Eosinophils Relative: 4 %
HCT: 34.1 % — ABNORMAL LOW (ref 39.0–52.0)
Hemoglobin: 11.3 g/dL — ABNORMAL LOW (ref 13.0–17.0)
Immature Granulocytes: 0 %
Lymphocytes Relative: 13 %
Lymphs Abs: 1.4 10*3/uL (ref 0.7–4.0)
MCH: 26.6 pg (ref 26.0–34.0)
MCHC: 33.1 g/dL (ref 30.0–36.0)
MCV: 80.2 fL (ref 80.0–100.0)
Monocytes Absolute: 0.8 10*3/uL (ref 0.1–1.0)
Monocytes Relative: 8 %
Neutro Abs: 7.8 10*3/uL — ABNORMAL HIGH (ref 1.7–7.7)
Neutrophils Relative %: 75 %
Platelets: 252 10*3/uL (ref 150–400)
RBC: 4.25 MIL/uL (ref 4.22–5.81)
RDW: 14.6 % (ref 11.5–15.5)
WBC: 10.5 10*3/uL (ref 4.0–10.5)
nRBC: 0 % (ref 0.0–0.2)

## 2023-08-10 LAB — COMPREHENSIVE METABOLIC PANEL WITH GFR
ALT: 14 U/L (ref 0–44)
AST: 17 U/L (ref 15–41)
Albumin: 3.1 g/dL — ABNORMAL LOW (ref 3.5–5.0)
Alkaline Phosphatase: 70 U/L (ref 38–126)
Anion gap: 10 (ref 5–15)
BUN: 34 mg/dL — ABNORMAL HIGH (ref 6–20)
CO2: 27 mmol/L (ref 22–32)
Calcium: 8.8 mg/dL — ABNORMAL LOW (ref 8.9–10.3)
Chloride: 99 mmol/L (ref 98–111)
Creatinine, Ser: 1.37 mg/dL — ABNORMAL HIGH (ref 0.61–1.24)
GFR, Estimated: 59 mL/min — ABNORMAL LOW (ref >60)
Glucose, Bld: 291 mg/dL — ABNORMAL HIGH (ref 70–99)
Potassium: 4.3 mmol/L (ref 3.5–5.1)
Sodium: 136 mmol/L (ref 135–145)
Total Bilirubin: 0.6 mg/dL (ref 0.0–1.2)
Total Protein: 7.6 g/dL (ref 6.5–8.1)

## 2023-08-10 LAB — URINALYSIS, W/ REFLEX TO CULTURE (INFECTION SUSPECTED)
Bilirubin Urine: NEGATIVE
Glucose, UA: 100 mg/dL — AB
Hgb urine dipstick: NEGATIVE
Ketones, ur: NEGATIVE mg/dL
Leukocytes,Ua: NEGATIVE
Nitrite: NEGATIVE
Protein, ur: 100 mg/dL — AB
Specific Gravity, Urine: 1.02 (ref 1.005–1.030)
pH: 5.5 (ref 5.0–8.0)

## 2023-08-10 LAB — C-REACTIVE PROTEIN: CRP: 16.7 mg/dL — ABNORMAL HIGH (ref <1.0)

## 2023-08-10 LAB — GLUCOSE, CAPILLARY: Glucose-Capillary: 141 mg/dL — ABNORMAL HIGH (ref 70–99)

## 2023-08-10 LAB — LACTIC ACID, PLASMA
Lactic Acid, Venous: 2 mmol/L (ref 0.5–1.9)
Lactic Acid, Venous: 1.2 mmol/L (ref 0.5–1.9)

## 2023-08-10 LAB — SEDIMENTATION RATE: Sed Rate: 70 mm/h — ABNORMAL HIGH (ref 0–16)

## 2023-08-10 MED ORDER — ATORVASTATIN CALCIUM 80 MG PO TABS
80.0000 mg | ORAL_TABLET | Freq: Every day | ORAL | Status: DC
  Administered 2023-08-11 – 2023-08-17 (×7): 80 mg via ORAL
  Filled 2023-08-10 (×7): qty 1

## 2023-08-10 MED ORDER — ENOXAPARIN SODIUM 80 MG/0.8ML IJ SOSY
70.0000 mg | PREFILLED_SYRINGE | INTRAMUSCULAR | Status: DC
Start: 2023-08-11 — End: 2023-08-12
  Administered 2023-08-11: 70 mg via SUBCUTANEOUS
  Filled 2023-08-10 (×2): qty 0.7

## 2023-08-10 MED ORDER — GADOBUTROL 1 MMOL/ML IV SOLN
10.0000 mL | Freq: Once | INTRAVENOUS | Status: AC | PRN
  Administered 2023-08-10: 10 mL via INTRAVENOUS

## 2023-08-10 MED ORDER — ALLOPURINOL 300 MG PO TABS
300.0000 mg | ORAL_TABLET | Freq: Two times a day (BID) | ORAL | Status: DC
  Administered 2023-08-10 – 2023-08-17 (×14): 300 mg via ORAL
  Filled 2023-08-10 (×14): qty 1

## 2023-08-10 MED ORDER — SODIUM CHLORIDE 0.9 % IV SOLN
3.0000 g | Freq: Once | INTRAVENOUS | Status: AC
  Administered 2023-08-10: 3 g via INTRAVENOUS

## 2023-08-10 MED ORDER — ROPINIROLE HCL 0.5 MG PO TABS: 4.0000 mg | ORAL_TABLET | Freq: Three times a day (TID) | ORAL | Status: DC

## 2023-08-10 MED ORDER — HYDROMORPHONE HCL 1 MG/ML IJ SOLN
0.5000 mg | Freq: Once | INTRAMUSCULAR | Status: AC
  Administered 2023-08-10: 0.5 mg via INTRAVENOUS
  Filled 2023-08-10: qty 1

## 2023-08-10 MED ORDER — FUROSEMIDE 40 MG PO TABS
40.0000 mg | ORAL_TABLET | Freq: Two times a day (BID) | ORAL | Status: DC
  Administered 2023-08-10 – 2023-08-11 (×3): 40 mg via ORAL
  Filled 2023-08-10 (×3): qty 1

## 2023-08-10 MED ORDER — ACETAMINOPHEN 325 MG PO TABS
650.0000 mg | ORAL_TABLET | Freq: Four times a day (QID) | ORAL | Status: DC | PRN
  Administered 2023-08-10: 650 mg via ORAL
  Filled 2023-08-10 (×2): qty 2

## 2023-08-10 MED ORDER — ONDANSETRON HCL 4 MG/2ML IJ SOLN
4.0000 mg | Freq: Once | INTRAMUSCULAR | Status: AC
  Administered 2023-08-10: 4 mg via INTRAVENOUS
  Filled 2023-08-10: qty 2

## 2023-08-10 MED ORDER — VANCOMYCIN HCL 1250 MG/250ML IV SOLN
1250.0000 mg | Freq: Two times a day (BID) | INTRAVENOUS | Status: DC
  Filled 2023-08-10: qty 250

## 2023-08-10 MED ORDER — CARVEDILOL 6.25 MG PO TABS
6.2500 mg | ORAL_TABLET | Freq: Two times a day (BID) | ORAL | Status: DC
Start: 2023-08-11 — End: 2023-08-18
  Administered 2023-08-11 – 2023-08-17 (×14): 6.25 mg via ORAL
  Filled 2023-08-10: qty 1
  Filled 2023-08-10: qty 2
  Filled 2023-08-10 (×12): qty 1

## 2023-08-10 MED ORDER — ROPINIROLE HCL 1 MG PO TABS
8.0000 mg | ORAL_TABLET | Freq: Every day | ORAL | Status: DC
  Administered 2023-08-10: 8 mg via ORAL
  Filled 2023-08-10: qty 8

## 2023-08-10 MED ORDER — VANCOMYCIN HCL 1250 MG/250ML IV SOLN
1250.0000 mg | Freq: Two times a day (BID) | INTRAVENOUS | Status: DC
  Administered 2023-08-11: 1250 mg via INTRAVENOUS
  Filled 2023-08-10 (×2): qty 250

## 2023-08-10 MED ORDER — ROPINIROLE HCL 0.5 MG PO TABS
4.0000 mg | ORAL_TABLET | Freq: Every morning | ORAL | Status: DC
  Administered 2023-08-11 – 2023-08-17 (×7): 4 mg via ORAL
  Filled 2023-08-10 (×7): qty 8

## 2023-08-10 MED ORDER — INSULIN ASPART 100 UNIT/ML IJ SOLN
0.0000 [IU] | Freq: Three times a day (TID) | INTRAMUSCULAR | Status: DC
  Administered 2023-08-11: 3 [IU] via SUBCUTANEOUS
  Administered 2023-08-11: 2 [IU] via SUBCUTANEOUS
  Administered 2023-08-11 – 2023-08-13 (×5): 3 [IU] via SUBCUTANEOUS
  Administered 2023-08-14: 2 [IU] via SUBCUTANEOUS
  Administered 2023-08-14: 3 [IU] via SUBCUTANEOUS
  Administered 2023-08-14: 5 [IU] via SUBCUTANEOUS
  Administered 2023-08-15 (×2): 2 [IU] via SUBCUTANEOUS
  Administered 2023-08-15: 3 [IU] via SUBCUTANEOUS
  Administered 2023-08-16: 2 [IU] via SUBCUTANEOUS
  Administered 2023-08-16 – 2023-08-17 (×2): 3 [IU] via SUBCUTANEOUS
  Administered 2023-08-17: 5 [IU] via SUBCUTANEOUS

## 2023-08-10 MED ORDER — SENNOSIDES-DOCUSATE SODIUM 8.6-50 MG PO TABS
2.0000 | ORAL_TABLET | Freq: Two times a day (BID) | ORAL | Status: DC
  Administered 2023-08-11 – 2023-08-17 (×12): 2 via ORAL
  Filled 2023-08-10 (×14): qty 2

## 2023-08-10 MED ORDER — ACETAMINOPHEN 650 MG RE SUPP: 650.0000 mg | Freq: Four times a day (QID) | RECTAL | Status: DC | PRN

## 2023-08-10 MED ORDER — AMLODIPINE BESYLATE 5 MG PO TABS: 5.0000 mg | ORAL_TABLET | Freq: Every day | ORAL | Status: DC

## 2023-08-10 MED ORDER — VANCOMYCIN HCL IN DEXTROSE 1-5 GM/200ML-% IV SOLN
1000.0000 mg | Freq: Once | INTRAVENOUS | Status: AC
  Administered 2023-08-10: 1000 mg via INTRAVENOUS
  Filled 2023-08-10: qty 200

## 2023-08-10 MED ORDER — SODIUM CHLORIDE 0.9 % IV BOLUS
1000.0000 mL | Freq: Once | INTRAVENOUS | Status: AC
  Administered 2023-08-10: 1000 mL via INTRAVENOUS

## 2023-08-10 MED ORDER — HYDROMORPHONE HCL 1 MG/ML IJ SOLN
0.5000 mg | INTRAMUSCULAR | Status: DC | PRN
  Administered 2023-08-10: 0.5 mg via INTRAVENOUS
  Filled 2023-08-10: qty 1

## 2023-08-10 MED ORDER — INSULIN ASPART 100 UNIT/ML IJ SOLN: 0.0000 [IU] | Freq: Every day | INTRAMUSCULAR | Status: DC

## 2023-08-10 MED ORDER — OXYCODONE HCL 5 MG PO TABS
5.0000 mg | ORAL_TABLET | Freq: Four times a day (QID) | ORAL | Status: DC | PRN
  Administered 2023-08-10 – 2023-08-11 (×2): 10 mg via ORAL
  Filled 2023-08-10 (×3): qty 2

## 2023-08-10 MED ORDER — SODIUM CHLORIDE 0.9 % IV SOLN
2.0000 g | Freq: Three times a day (TID) | INTRAVENOUS | Status: DC
  Administered 2023-08-10: 2 g via INTRAVENOUS
  Filled 2023-08-10: qty 12.5

## 2023-08-10 MED ORDER — SODIUM CHLORIDE 0.9% FLUSH
3.0000 mL | Freq: Two times a day (BID) | INTRAVENOUS | Status: DC
  Administered 2023-08-10 – 2023-08-17 (×12): 3 mL via INTRAVENOUS

## 2023-08-10 MED ORDER — VANCOMYCIN HCL 2000 MG/400ML IV SOLN
2000.0000 mg | Freq: Once | INTRAVENOUS | Status: DC
Start: 2023-08-10 — End: 2023-08-10
  Filled 2023-08-10: qty 400

## 2023-08-10 MED ORDER — OXYCODONE HCL 10 MG PO TABS
5.0000 mg | ORAL_TABLET | Freq: Four times a day (QID) | ORAL | Status: DC | PRN
Start: 2023-08-10 — End: 2023-08-10

## 2023-08-10 MED ORDER — PIPERACILLIN-TAZOBACTAM 3.375 G IVPB
3.3750 g | Freq: Three times a day (TID) | INTRAVENOUS | Status: DC
  Administered 2023-08-10 – 2023-08-15 (×15): 3.375 g via INTRAVENOUS
  Filled 2023-08-10 (×14): qty 50

## 2023-08-10 MED ORDER — ONDANSETRON HCL 4 MG/2ML IJ SOLN
4.0000 mg | Freq: Four times a day (QID) | INTRAMUSCULAR | Status: DC | PRN
  Administered 2023-08-11 (×3): 4 mg via INTRAVENOUS
  Filled 2023-08-10 (×3): qty 2

## 2023-08-10 MED ORDER — LISINOPRIL 20 MG PO TABS
20.0000 mg | ORAL_TABLET | Freq: Every day | ORAL | Status: DC
  Administered 2023-08-11: 20 mg via ORAL
  Filled 2023-08-10: qty 1

## 2023-08-10 MED ORDER — HYDROMORPHONE HCL 1 MG/ML IJ SOLN: 0.5000 mg | INTRAMUSCULAR | Status: DC | PRN

## 2023-08-10 MED ORDER — POLYETHYLENE GLYCOL 3350 17 G PO PACK: 17.0000 g | PACK | Freq: Every day | ORAL | Status: DC | PRN

## 2023-08-10 NOTE — Telephone Encounter (Signed)
 Called pt not able to Lvm and was seen in the ER today. Justin Hall said he would see him.

## 2023-08-10 NOTE — Assessment & Plan Note (Signed)
 At baseline. C.w. lasix  chronic. No fluid overload clinically at this time.

## 2023-08-10 NOTE — ED Notes (Signed)
 Called [facility] and spoke to [facility] regarding hospitalist consult at [phone]

## 2023-08-10 NOTE — Assessment & Plan Note (Signed)
 C.w. lipitor

## 2023-08-10 NOTE — Telephone Encounter (Signed)
 Contacted pt- seen in ER

## 2023-08-10 NOTE — Assessment & Plan Note (Signed)
 This is chronic, patient advises that he takes 8 mg of ropinirole  at bedtime.  And 4 mg in the morning.

## 2023-08-10 NOTE — Progress Notes (Signed)
 ED Pharmacy Antibiotic Sign Off An antibiotic consult was received from an ED provider for vancomycin  and unasyn  per pharmacy dosing for wound infection. A chart review was completed to assess appropriateness.   The following one time order(s) were placed:  Vancomycin  2g IV x 1 Unasyn  3g IV x 1  Further antibiotic and/or antibiotic pharmacy consults should be ordered by the admitting provider if indicated.   Thank you for allowing pharmacy to be a part of this patient's care.   Keily Lepp, Kathlene Paradise, [facility]  Clinical Pharmacist 08/10/23 1:16 PM

## 2023-08-10 NOTE — Assessment & Plan Note (Signed)
 Continue with allopurinol

## 2023-08-10 NOTE — Telephone Encounter (Signed)
 Contacted patient and sent to ED
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
## 2023-08-10 NOTE — ED Provider Notes (Addendum)
 [facility] Provider Note   CSN: [id number] Arrival date & time: 08/10/23  1145     History  Chief Complaint  Patient presents with   Cellulitis    Justin Hall is a 58 y.o. male.  Patient with history of diabetes (A1c 03/2023 8.8%), history of wound infection --presents to the emergency department today for evaluation of worsening left foot infection.  Patient initially had increased redness and swelling about 10 days ago.  He was treated with antibiotics.  He did seem to be having some improvement however over the past 2 days, he has had to wounds/ulcerations open up on the lateral portion of his foot.  He has had increased drainage with foul odor.  No fevers.  Pain has increased.  He has been in contact with both PCP and his podiatrist.  He contacted them this morning and it was recommended he come to the emergency department for evaluation.  Patient denies history of poor circulation in the leg or foot.  He denies smoking.       Home Medications Prior to Admission medications   Medication Sig Start Date End Date Taking? Authorizing Provider  albuterol  (VENTOLIN  HFA) 108 (90 Base) MCG/ACT inhaler Inhale 2 puffs into the lungs every 6 (six) hours as needed for wheezing or shortness of breath. 02/01/21   Jobe Mulder, DO  allopurinol  (ZYLOPRIM ) 300 MG tablet Take 1 tablet (300 mg total) by mouth 2 (two) times daily. 12/29/22   Jobe Mulder, DO  amLODipine  (NORVASC ) 5 MG tablet Take 1 tablet by mouth daily. 07/13/23   Jobe Mulder, DO  atorvastatin  (LIPITOR ) 80 MG tablet Take 1 tablet by mouth daily. 07/13/23   Jobe Mulder, DO  blood glucose meter kit and supplies Dispense based on patient and insurance preference. Use up to four times daily as directed. (FOR ICD-10 E10.9, E11.9). 10/21/18   Kraig Peru, MD  carvedilol  (COREG ) 6.25 MG tablet Take 1 tablet (6.25 mg total) by mouth 2 (two) times  daily with a meal. 04/07/23   Wendling, Shellie Dials, DO  doxycycline  (VIBRA -TABS) 100 MG tablet Take 1 tablet (100 mg total) by mouth 2 (two) times daily for 4 days. 08/07/23 08/11/23  Jobe Mulder, DO  furosemide  (LASIX ) 40 MG tablet Take 1 tablet by mouth twice daily. 07/13/23   Jobe Mulder, DO  gabapentin  (NEURONTIN ) 300 MG capsule TAKE 1 CAPSULE(300 MG) BY MOUTH THREE TIMES DAILY 05/26/23   Gwenette Lennox, Shellie Dials, DO  gentamicin  ointment (GARAMYCIN ) 0.1 % Apply 1 Application topically 3 (three) times daily. 05/25/23   Charity Conch, DPM  indomethacin  (INDOCIN ) 50 MG capsule TAKE 1 CAPSULE BY MOUTH TWICE DAILY AS NEEDED FOR GOUT FLARES 04/13/23   Gwenette Lennox, Shellie Dials, DO  lisinopril  (ZESTRIL ) 20 MG tablet Take 1 tablet (20 mg total) by mouth daily. 04/07/23   Jobe Mulder, DO  metFORMIN  (GLUCOPHAGE  XR) 500 MG 24 hr tablet Take 2 tablets (1,000 mg total) by mouth in the morning and at bedtime. 04/07/23   Jobe Mulder, DO  methocarbamol  (ROBAXIN ) 500 MG tablet Take 1 tablet (500 mg total) by mouth 2 (two) times daily. 07/28/23   Jobe Mulder, DO  Oxycodone  HCl 10 MG TABS Take 0.5-1 tablets (5-10 mg total) by mouth every 6 (six) hours as needed (Pain). 07/28/23   Jobe Mulder, DO  Oxycodone  HCl 10 MG TABS Take 0.5-1 tablets (5-10 mg total) by mouth every 6 (  six) hours as needed. 08/27/23   Jobe Mulder, DO  Oxycodone  HCl 10 MG TABS Take 0.5-1 tablets (5-10 mg total) by mouth every 6 (six) hours as needed (Pain). 09/26/23   Jobe Mulder, DO  pramipexole  (MIRAPEX ) 0.125 MG tablet Take 1 tab with supper. 05/15/23   Jobe Mulder, DO  sildenafil  (VIAGRA ) 100 MG tablet Take 0.5-1 tablets (50-100 mg total) by mouth daily as needed for erectile dysfunction. 07/28/23   Jobe Mulder, DO  silver  sulfADIAZINE  (SILVADENE ) 1 % cream Apply 1 Application topically daily. 02/18/22   Charity Conch, DPM   tirzepatide [facility]) 10 MG/0.5ML Pen Inject 10 mg into the skin once a week for 28 days. 08/25/23 09/22/23  Jobe Mulder, DO  tirzepatide [facility]) 12.5 MG/0.5ML Pen Inject 12.5 mg into the skin once a week for 28 days. 09/22/23 10/20/23  Jobe Mulder, DO  tirzepatide (MOUNJARO) 15 MG/0.5ML Pen Inject 15 mg into the skin once a week. 10/20/23   Jobe Mulder, DO  tirzepatide (MOUNJARO) 7.5 MG/0.5ML Pen Inject 7.5 mg into the skin once a week for 28 days. 07/28/23 08/25/23  Jobe Mulder, DO      Allergies    Patient has no known allergies.    Review of Systems   Review of Systems  Physical Exam Updated Vital Signs BP (!) 165/98   Pulse 88   Temp 98 F (36.7 C) (Oral)   Resp 18   SpO2 97%  Physical Exam Vitals and nursing note reviewed.  Constitutional:      General: He is not in acute distress.    Appearance: He is well-developed.  HENT:     Head: Normocephalic and atraumatic.  Eyes:     General:        Right eye: No discharge.        Left eye: No discharge.     Conjunctiva/sclera: Conjunctivae normal.  Cardiovascular:     Rate and Rhythm: Normal rate and regular rhythm.     Heart sounds: Normal heart sounds.  Pulmonary:     Effort: Pulmonary effort is normal. No respiratory distress.     Breath sounds: Normal breath sounds.  Abdominal:     Palpations: Abdomen is soft.     Tenderness: There is no abdominal tenderness.  Musculoskeletal:     Cervical back: Normal range of motion and neck supple.     Comments: Left foot: There is generalized cellulitis across the dorsum of the toes with eschar noted overlying the lateral foot and dorsum of the foot at the level of the fifth metatarsal head.  Foul odor noted.  No lymphangitis up the leg.  Patient is very tender proximal to the wound.  Decree sensation of the toes and the sole of the foot.  Skin:    Findings: Erythema and lesion present.  Neurological:     Mental Status: He is alert.   Psychiatric:        Mood and Affect: Mood is anxious.           ED Results / Procedures / Treatments   Labs (all labs ordered are listed, but only abnormal results are displayed) Labs Reviewed  LACTIC ACID, PLASMA - Abnormal; Notable for the following components:      Result Value   Lactic Acid, Venous 2.0 (*)    All other components within normal limits  COMPREHENSIVE METABOLIC PANEL WITH GFR - Abnormal; Notable for the following components:   Glucose, Bld 291 (*)  BUN 34 (*)    Creatinine, Ser 1.37 (*)    Calcium  8.8 (*)    Albumin 3.1 (*)    GFR, Estimated 59 (*)    All other components within normal limits  CBC WITH DIFFERENTIAL/PLATELET - Abnormal; Notable for the following components:   Hemoglobin 11.3 (*)    HCT 34.1 (*)    Neutro Abs 7.8 (*)    All other components within normal limits  URINALYSIS, W/ REFLEX TO CULTURE (INFECTION SUSPECTED) - Abnormal; Notable for the following components:   Glucose, UA 100 (*)    Protein, ur 100 (*)    Bacteria, UA RARE (*)    All other components within normal limits  SEDIMENTATION RATE - Abnormal; Notable for the following components:   Sed Rate 70 (*)    All other components within normal limits  CULTURE, BLOOD (ROUTINE X 2)  CULTURE, BLOOD (ROUTINE X 2)  LACTIC ACID, PLASMA  C-REACTIVE PROTEIN    EKG None  Radiology No results found.  Procedures Procedures    Medications Ordered in ED Medications  vancomycin  (VANCOCIN ) IVPB 1000 mg/200 mL premix (1,000 mg Intravenous New Bag/Given 08/10/23 1321)    Followed by  vancomycin  (VANCOCIN ) IVPB 1000 mg/200 mL premix (1,000 mg Intravenous New Bag/Given 08/10/23 1422)  HYDROmorphone  (DILAUDID ) injection 0.5 mg (has no administration in time range)  Ampicillin -Sulbactam (UNASYN ) 3 g in sodium chloride  0.9 % 100 mL IVPB (0 g Intravenous Stopped 08/10/23 1405)  HYDROmorphone  (DILAUDID ) injection 0.5 mg (0.5 mg Intravenous Given 08/10/23 1313)  ondansetron  (ZOFRAN )  injection 4 mg (4 mg Intravenous Given 08/10/23 1312)  sodium chloride  0.9 % bolus 1,000 mL (1,000 mLs Intravenous New Bag/Given 08/10/23 1327)    ED Course/ Medical Decision Making/ A&P    Patient seen and examined. History obtained directly from patient.   Labs/EKG: Ordered CBC, CMP, lactate, occult blood cultures, ESR/CRP.  Imaging: Ordered plain film x-ray of the foot.  Medications/Fluids: Ordered: Per order set, IV Unasyn  and IV vancomycin .  IV Dilaudid  and Zofran .  Most recent vital signs reviewed and are as follows: BP (!) 165/98   Pulse 88   Temp 98 F (36.7 C) (Oral)   Resp 18   SpO2 97%   Initial impression: Diabetic wound infection  I have spoken with the patient's podiatrist, Dr. Clydia Dart, by secure chat.  Recommends admission to: If possible, plain films/MRI, they will consult.  2:44 PM Reassessment performed. Patient appears stable, needs additional pain medication.  Labs personally reviewed and interpreted including: CBC with white blood cell count 10.5 with neutrophils 7.8, hemoglobin 11.3; CMP glucose is 291, creatinine is 1.37 with a BUN of 34; lactate 2.0 IV fluid bolus ordered; UA without compelling signs of infection; ESR elevated at 70.  Imaging personally visualized and interpreted including: X-ray of the foot, question subtle cortical irregularity at the head of the fifth metatarsal with soft tissue gas consistent with patient's wound, waiting radiology opinion.  Reviewed pertinent lab work and imaging with patient at bedside. Questions answered.   Most current vital signs reviewed and are as follows: BP (!) 165/98   Pulse 88   Temp 98 F (36.7 C) (Oral)   Resp 18   SpO2 97%   Plan: Continuing to await hospitalist callback, secretary followed up with [facility] about this.  Patient updated.  Wife now at bedside.  Doing well, needs additional pain control.  3:11 PM Spoke with Dr. Moishe Angel who accepts for admission. Lactate 2.0 >> 1.2.  3:18 PM  Per Dr.  Lydia Sams, requests broader antibiotic coverage with cefepime . Unasyn  already administered. Will add cefepime .                                Medical Decision Making Amount and/or Complexity of Data Reviewed Labs: ordered. Radiology: ordered.  Risk Prescription drug management. Decision regarding hospitalization.   Patient with recent cellulitis of the left foot, now with grossly infected wounds.  No fevers.  Lactate borderline at 2.0.  Patient is hyperglycemic without signs of DKA.  He has failed outpatient oral antibiotics.  He will be admitted for IV antibiotics and podiatry consult.  They are aware patient in the ED and have provided additional recommendations.        Final Clinical Impression(s) / ED Diagnoses Final diagnoses:  Diabetic foot infection [facility])    Rx / DC Orders ED Discharge Orders     None         Lyna Sandhoff, PA-C 08/10/23 1513    Lyna Sandhoff, PA-C 08/10/23 1519    Ninetta Basket, MD 08/16/23 [phone]

## 2023-08-10 NOTE — Assessment & Plan Note (Signed)
 Patient has hip arthritis pain additionally he has pain due to his foot infection right now.  Continue with chronic oxycodone .  5 to 10 mg for moderate to severe pain.  Bowel regimen with senna docusate ordered

## 2023-08-10 NOTE — Assessment & Plan Note (Signed)
 With foul-smelling discharge, elevated sed rate, however no fever or white count.  Lactic acid elevation felt to be due to metformin .  Patient received Unasyn  and vancomycin  in the ER.  I will continue treatment with vancomycin  plus Zosyn .  Dr. Rosemarie Conquest has been engaged.  He advised need for an MRI with possible OR on Wednesday.

## 2023-08-10 NOTE — Progress Notes (Signed)
 Psychosocial Progressive/Outcome: ANOx4, calm and cooperative   Pain/Comfort Progression/Outcome: Pain treated with PRN medication   Clinical Progression/Outcome: Adequate fluid and diet intake  Independent in positioning  SBA  Voids to BR as needed  No BM Dressing D/C/I Slept in between care Maintained safety

## 2023-08-10 NOTE — Assessment & Plan Note (Signed)
 Patient uses metformin  at home, patient has associated slight lactic acid elevation.  Metformin  stopped.  Changed to insulin  sliding scale.

## 2023-08-10 NOTE — ED Triage Notes (Signed)
 Recurrent cellulitis to left foot , issues for 1 year , infection looks worse per patient and PCP , sent here for further evaluation . Hx diabetes . Presents with cam walker . Severe pain and intermittent fever and chills .

## 2023-08-10 NOTE — Progress Notes (Signed)
 Pharmacy Antibiotic Note  Justin Hall is a 58 y.o. male admitted on 08/10/2023 with  wound infection .  Pharmacy has been consulted for zosyn  and vanco dosing.  Plan: Vancomycin  2000 mg IV as load dose f/b 1250 mg IV every 12 hours.  eAUC 469, scr 1.37, Vd 0.5 Zosyn  3.375g IV q8h (4 hour infusion).  Height: 5\' 9"  (175.3 cm) Weight: (!) 147 kg (324 lb 1.2 oz) IBW/kg (Calculated) : 70.7  Temp (24hrs), Avg:98 F (36.7 C), Min:97.8 F (36.6 C), Max:98.2 F (36.8 C)  Recent Labs  Lab 08/10/23 1247 08/10/23 1427  WBC 10.5  --   CREATININE 1.37*  --   LATICACIDVEN 2.0* 1.2    Estimated Creatinine Clearance: 82.1 mL/min (A) (by C-G formula based on SCr of 1.37 mg/dL (H)).    No Known Allergies    Thank you for allowing pharmacy to be a part of this patient's care.  Marleta Simmer BS, PharmD, BCPS Clinical Pharmacist 08/10/2023 6:55 PM  Contact: [phone] after 3 PM  "Be curious, not judgmental..." -Rumalda Counter

## 2023-08-10 NOTE — Assessment & Plan Note (Addendum)
 Continue with , Coreg , lisinopril . Hold amlodipine  as fill history shows that patient is not adherant  lisinopril  and his BP is ok for now. Would rather start lisnopril in this patient.

## 2023-08-10 NOTE — Progress Notes (Signed)
 Call [facility] at [phone] re: Pt Justin, Hall MRN [id number] at [facility].  JY:[id number] for OV abx and MRI. Diabetic foot infection, left foot. Off and on for past year. Pt is being followed by podiatry dr.wagner TFA. Vitals WNL and pulses WNL. Pt accepted to med tele bed.   Vitals:   08/10/23 1156  BP: (!) 165/98        Latest Ref Rng & Units 08/10/2023   12:47 PM 04/07/2023    8:30 AM 07/19/2022    6:38 PM  CMP  Glucose 70 - 99 mg/dL 086  578  469   BUN 6 - 20 mg/dL 34  30  24   Creatinine 0.61 - 1.24 mg/dL 6.29  5.28  4.13   Sodium 135 - 145 mmol/L 136  139  135   Potassium 3.5 - 5.1 mmol/L 4.3  4.9  3.6   Chloride 98 - 111 mmol/L 99  97  95   CO2 22 - 32 mmol/L 27  36  30   Calcium  8.9 - 10.3 mg/dL 8.8  8.8  8.3   Total Protein 6.5 - 8.1 g/dL 7.6  7.6  7.7   Total Bilirubin 0.0 - 1.2 mg/dL 0.6  0.6  0.5   Alkaline Phos 38 - 126 U/L 70  97  71   AST 15 - 41 U/L 17  11  17    ALT 0 - 44 U/L 14  8  12        Latest Ref Rng & Units 08/10/2023   12:47 PM 04/07/2023    8:30 AM 07/19/2022    6:38 PM  CBC  WBC 4.0 - 10.5 K/uL 10.5  9.7  8.6   Hemoglobin 13.0 - 17.0 g/dL 24.4  01.0  27.2   Hematocrit 39.0 - 52.0 % 34.1  39.2  33.9   Platelets 150 - 400 K/uL 252  228.0  217      vancomycin  1,000 mg (08/10/23 1422)  Unasyn  3 gm given in ed.

## 2023-08-11 ENCOUNTER — Encounter (HOSPITAL_COMMUNITY)

## 2023-08-11 DIAGNOSIS — E11628 Type 2 diabetes mellitus with other skin complications: Secondary | ICD-10-CM

## 2023-08-11 DIAGNOSIS — M869 Osteomyelitis, unspecified: Secondary | ICD-10-CM

## 2023-08-11 DIAGNOSIS — L089 Local infection of the skin and subcutaneous tissue, unspecified: Secondary | ICD-10-CM

## 2023-08-11 DIAGNOSIS — M86172 Other acute osteomyelitis, left ankle and foot: Secondary | ICD-10-CM

## 2023-08-11 LAB — CBC
HCT: 35.1 % — ABNORMAL LOW (ref 39.0–52.0)
Hemoglobin: 11.1 g/dL — ABNORMAL LOW (ref 13.0–17.0)
MCH: 26.3 pg (ref 26.0–34.0)
MCHC: 31.6 g/dL (ref 30.0–36.0)
MCV: 83.2 fL (ref 80.0–100.0)
Platelets: 254 10*3/uL (ref 150–400)
RBC: 4.22 MIL/uL (ref 4.22–5.81)
RDW: 15 % (ref 11.5–15.5)
WBC: 10.5 10*3/uL (ref 4.0–10.5)
nRBC: 0 % (ref 0.0–0.2)

## 2023-08-11 LAB — BASIC METABOLIC PANEL WITH GFR
Anion gap: 11 (ref 5–15)
BUN: 25 mg/dL — ABNORMAL HIGH (ref 6–20)
CO2: 25 mmol/L (ref 22–32)
Calcium: 8.4 mg/dL — ABNORMAL LOW (ref 8.9–10.3)
Chloride: 101 mmol/L (ref 98–111)
Creatinine, Ser: 1.31 mg/dL — ABNORMAL HIGH (ref 0.61–1.24)
GFR, Estimated: >60 mL/min (ref >60)
Glucose, Bld: 149 mg/dL — ABNORMAL HIGH (ref 70–99)
Potassium: 4.6 mmol/L (ref 3.5–5.1)
Sodium: 137 mmol/L (ref 135–145)

## 2023-08-11 LAB — HEMOGLOBIN A1C
Hgb A1c MFr Bld: 7.2 % — ABNORMAL HIGH (ref 4.8–5.6)
Mean Plasma Glucose: 159.94 mg/dL

## 2023-08-11 LAB — GLUCOSE, CAPILLARY
Glucose-Capillary: 157 mg/dL — ABNORMAL HIGH (ref 70–99)
Glucose-Capillary: 173 mg/dL — ABNORMAL HIGH (ref 70–99)
Glucose-Capillary: 147 mg/dL — ABNORMAL HIGH (ref 70–99)
Glucose-Capillary: 191 mg/dL — ABNORMAL HIGH (ref 70–99)

## 2023-08-11 LAB — PROTIME-INR
INR: 1 (ref 0.8–1.2)
Prothrombin Time: 13.7 s (ref 11.4–15.2)

## 2023-08-11 LAB — HIV ANTIBODY (ROUTINE TESTING W REFLEX): HIV Screen 4th Generation wRfx: NONREACTIVE

## 2023-08-11 LAB — APTT: aPTT: 31 s (ref 24–36)

## 2023-08-11 MED ORDER — VANCOMYCIN HCL 1.25 G IV SOLR
1250.0000 mg | Freq: Two times a day (BID) | INTRAVENOUS | Status: DC
  Administered 2023-08-11 (×2): 1250 mg via INTRAVENOUS
  Filled 2023-08-11 (×4): qty 25

## 2023-08-11 MED ORDER — HYDROXYZINE HCL 25 MG PO TABS
25.0000 mg | ORAL_TABLET | Freq: Three times a day (TID) | ORAL | Status: DC | PRN
  Administered 2023-08-11 – 2023-08-17 (×5): 25 mg via ORAL
  Filled 2023-08-11 (×5): qty 1

## 2023-08-11 MED ORDER — HYDROMORPHONE HCL 1 MG/ML IJ SOLN
0.5000 mg | INTRAMUSCULAR | Status: AC | PRN
  Administered 2023-08-11 – 2023-08-12 (×2): 0.5 mg via INTRAVENOUS
  Filled 2023-08-11 (×2): qty 0.5

## 2023-08-11 MED ORDER — OXYCODONE HCL 5 MG PO TABS
5.0000 mg | ORAL_TABLET | ORAL | Status: DC | PRN
  Administered 2023-08-11 – 2023-08-12 (×6): 10 mg via ORAL
  Filled 2023-08-11 (×6): qty 2

## 2023-08-11 MED ORDER — ROPINIROLE HCL 1 MG PO TABS
8.0000 mg | ORAL_TABLET | Freq: Every day | ORAL | Status: DC
  Administered 2023-08-11 – 2023-08-13 (×3): 8 mg via ORAL
  Filled 2023-08-11 (×3): qty 8

## 2023-08-11 MED ORDER — HALOPERIDOL LACTATE 5 MG/ML IJ SOLN
2.0000 mg | Freq: Once | INTRAMUSCULAR | Status: AC
  Administered 2023-08-11: 2 mg via INTRAVENOUS
  Filled 2023-08-11: qty 1

## 2023-08-11 NOTE — Hospital Course (Signed)
 58 y.o. male with medical history significant of knwown DM. Patinet described having "cellulitis" of the left foot X 2 weeks ago s.p doxycycline . However, had abrupt finding of left foot (just proximal to the pinky toe dorsally) new swellig , erythema, and foul smelling discahrge develop this AM.   Patinet discussed this with his podiatry Dr. Jeana Michaels and advised to come to [facility] ER.   Patient reports some chills however no documented fever patient has been nauseous but no vomiting.  Patient does not does not describe any spreading erythema from the foot.  Patient has had increased pain at the site

## 2023-08-11 NOTE — Plan of Care (Signed)

## 2023-08-11 NOTE — Progress Notes (Signed)
  Progress Note   Patient: Justin Hall ZOX:[id number] DOB: September 23, 1965 DOA: 08/10/2023     1 DOS: the patient was seen and examined on 08/11/2023   Brief hospital course: 58 y.o. male with medical history significant of knwown DM. Patinet described having "cellulitis" of the left foot X 2 weeks ago s.p doxycycline . However, had abrupt finding of left foot (just proximal to the pinky toe dorsally) new swellig , erythema, and foul smelling discahrge develop this AM.   Patinet discussed this with his podiatry Dr. Jeana Michaels and advised to come to [facility] ER.   Patient reports some chills however no documented fever patient has been nauseous but no vomiting.  Patient does not does not describe any spreading erythema from the foot.  Patient has had increased pain at the site  Assessment and Plan: L Diabetic foot ulcer ([name]) -Presented with foul-smelling discharge, elevated sed rate, however no fever or white count.   -Patient received Unasyn  and vancomycin  in the ER, continued on vancomycin  plus Zosyn .   -MRI reviewed. Osteomyelitis involinv 5th metatarsal head and distal half of 5th metatarsal shaft with probable osteomyelitis within 5th prox phalanx. Also extensive inflammatory changes noted throughout adjacent tissues -Podiatry consulted. Plan for L partial 5th ray amputation   Pain Patient has hip arthritis pain additionally he has pain due to his foot infection right now.   -Continue with analgesia as needed   Dyslipidemia C/w lipitor    CKD (chronic kidney disease) stage 1, GFR 90 ml/min or greater At baseline. C.w. lasix  chronic.  -recheck bmet in AM -Cr remains stable   Chronic gout without tophus Continue with allopurinol .   Essential hypertension Continue with , Coreg , lisinopril .  -BP stable   DM2 (diabetes mellitus, type 2) ([name]) Patient uses metformin  at home, patient has associated slight lactic acid elevation.  Metformin  stopped.  Changed to insulin  sliding scale.    Restless leg syndrome This is chronic -Cont 8 mg of ropinirole  at bedtime.  And 4 mg in the morning.   Subjective: Without complaints this AM  Physical Exam: Vitals:   08/10/23 1756 08/10/23 2000 08/11/23 0739 08/11/23 1428  BP:  138/88 (!) 181/95 (!) 158/87  Pulse:   87 88  Resp:  18 19 16   Temp:  98 F (36.7 C) 98.6 F (37 C) 98.6 F (37 C)  TempSrc:  Oral Oral   SpO2:  96% 93% 91%  Weight: (!) 147 kg     Height: 5\' 9"  (1.753 m)      General exam: Awake, laying in bed, in nad Respiratory system: Normal respiratory effort, no wheezing Cardiovascular system: regular rate, s1, s2 Gastrointestinal system: Soft, nondistended, positive BS Central nervous system: CN2-12 grossly intact, strength intact Extremities: Perfused, L foot with dressings in place Skin: Normal skin turgor, no notable skin lesions seen Psychiatry: Mood normal // no visual hallucinations   Data Reviewed:  Labs reviewed: Na 137, K 4.6, Cr 1.31, WBC 10.5, Hgb 11.1, Plts 254  Family Communication: Pt in room, familiy at bedside  Disposition: Status is: Inpatient Remains inpatient appropriate because: severity of illness  Planned Discharge Destination:  Unclear at this time    Author: Cherylle Corwin, MD 08/11/2023 5:47 PM  For on call review [email or web address].
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
## 2023-08-12 ENCOUNTER — Inpatient Hospital Stay (HOSPITAL_COMMUNITY)

## 2023-08-12 ENCOUNTER — Encounter (HOSPITAL_COMMUNITY): Payer: Self-pay | Admitting: Internal Medicine

## 2023-08-12 ENCOUNTER — Encounter (HOSPITAL_COMMUNITY)
Admission: EM | Disposition: A | Discharge status: Home Health | Payer: Self-pay | Source: Home / Self Care | Attending: Family Medicine

## 2023-08-12 ENCOUNTER — Other Ambulatory Visit: Payer: Self-pay

## 2023-08-12 DIAGNOSIS — E1169 Type 2 diabetes mellitus with other specified complication: Secondary | ICD-10-CM

## 2023-08-12 DIAGNOSIS — I2510 Atherosclerotic heart disease of native coronary artery without angina pectoris: Secondary | ICD-10-CM

## 2023-08-12 DIAGNOSIS — E11621 Type 2 diabetes mellitus with foot ulcer: Secondary | ICD-10-CM | POA: Diagnosis not present

## 2023-08-12 DIAGNOSIS — E782 Mixed hyperlipidemia: Secondary | ICD-10-CM | POA: Diagnosis not present

## 2023-08-12 DIAGNOSIS — M86172 Other acute osteomyelitis, left ankle and foot: Secondary | ICD-10-CM | POA: Diagnosis not present

## 2023-08-12 DIAGNOSIS — M869 Osteomyelitis, unspecified: Secondary | ICD-10-CM

## 2023-08-12 DIAGNOSIS — L97529 Non-pressure chronic ulcer of other part of left foot with unspecified severity: Secondary | ICD-10-CM | POA: Diagnosis not present

## 2023-08-12 HISTORY — PX: AMPUTATION: SHX166

## 2023-08-12 LAB — COMPREHENSIVE METABOLIC PANEL WITH GFR
ALT: 21 U/L (ref 0–44)
AST: 13 U/L — ABNORMAL LOW (ref 15–41)
Albumin: 2.8 g/dL — ABNORMAL LOW (ref 3.5–5.0)
Alkaline Phosphatase: 52 U/L (ref 38–126)
Anion gap: 12 (ref 5–15)
BUN: 24 mg/dL — ABNORMAL HIGH (ref 6–20)
CO2: 30 mmol/L (ref 22–32)
Calcium: 8.5 mg/dL — ABNORMAL LOW (ref 8.9–10.3)
Chloride: 96 mmol/L — ABNORMAL LOW (ref 98–111)
Creatinine, Ser: 1.61 mg/dL — ABNORMAL HIGH (ref 0.61–1.24)
GFR, Estimated: 49 mL/min — ABNORMAL LOW (ref >60)
Glucose, Bld: 154 mg/dL — ABNORMAL HIGH (ref 70–99)
Potassium: 4.2 mmol/L (ref 3.5–5.1)
Sodium: 138 mmol/L (ref 135–145)
Total Bilirubin: 0.7 mg/dL (ref 0.0–1.2)
Total Protein: 7.5 g/dL (ref 6.5–8.1)

## 2023-08-12 LAB — GLUCOSE, CAPILLARY
Glucose-Capillary: 144 mg/dL — ABNORMAL HIGH (ref 70–99)
Glucose-Capillary: 168 mg/dL — ABNORMAL HIGH (ref 70–99)
Glucose-Capillary: 129 mg/dL — ABNORMAL HIGH (ref 70–99)
Glucose-Capillary: 117 mg/dL — ABNORMAL HIGH (ref 70–99)
Glucose-Capillary: 194 mg/dL — ABNORMAL HIGH (ref 70–99)

## 2023-08-12 LAB — CBC
HCT: 34.3 % — ABNORMAL LOW (ref 39.0–52.0)
Hemoglobin: 10.9 g/dL — ABNORMAL LOW (ref 13.0–17.0)
MCH: 26.1 pg (ref 26.0–34.0)
MCHC: 31.8 g/dL (ref 30.0–36.0)
MCV: 82.1 fL (ref 80.0–100.0)
Platelets: 268 10*3/uL (ref 150–400)
RBC: 4.18 MIL/uL — ABNORMAL LOW (ref 4.22–5.81)
RDW: 14.8 % (ref 11.5–15.5)
WBC: 10.6 10*3/uL — ABNORMAL HIGH (ref 4.0–10.5)
nRBC: 0 % (ref 0.0–0.2)

## 2023-08-12 SURGERY — AMPUTATION, FOOT, RAY
Anesthesia: Monitor Anesthesia Care | Laterality: Left

## 2023-08-12 MED ORDER — BUPIVACAINE HCL (PF) 0.5 % IJ SOLN
INTRAMUSCULAR | Status: AC
  Filled 2023-08-12: qty 30

## 2023-08-12 MED ORDER — GABAPENTIN 300 MG PO CAPS
300.0000 mg | ORAL_CAPSULE | Freq: Three times a day (TID) | ORAL | Status: DC
  Administered 2023-08-12 – 2023-08-17 (×15): 300 mg via ORAL
  Filled 2023-08-12 (×17): qty 1

## 2023-08-12 MED ORDER — FENTANYL CITRATE (PF) 250 MCG/5ML IJ SOLN
INTRAMUSCULAR | Status: DC | PRN
  Administered 2023-08-12 (×4): 25 ug via INTRAVENOUS

## 2023-08-12 MED ORDER — TOBRAMYCIN SULFATE 80 MG/2ML IJ SOLN
INTRAMUSCULAR | Status: AC
  Filled 2023-08-12: qty 4

## 2023-08-12 MED ORDER — PROPOFOL 10 MG/ML IV BOLUS
INTRAVENOUS | Status: DC | PRN
  Administered 2023-08-12: 30 mg via INTRAVENOUS
  Administered 2023-08-12: 20 mg via INTRAVENOUS

## 2023-08-12 MED ORDER — CHLORHEXIDINE GLUCONATE 0.12 % MT SOLN
OROMUCOSAL | Status: AC
  Filled 2023-08-12: qty 15

## 2023-08-12 MED ORDER — 0.9 % SODIUM CHLORIDE (POUR BTL) OPTIME
TOPICAL | Status: DC | PRN
  Administered 2023-08-12: 1000 mL

## 2023-08-12 MED ORDER — HYDROMORPHONE HCL 1 MG/ML IJ SOLN
0.5000 mg | INTRAMUSCULAR | Status: AC | PRN
  Administered 2023-08-12 – 2023-08-13 (×6): 0.5 mg via INTRAVENOUS
  Filled 2023-08-12 (×6): qty 0.5

## 2023-08-12 MED ORDER — PRAMIPEXOLE DIHYDROCHLORIDE 0.125 MG PO TABS
0.1250 mg | ORAL_TABLET | Freq: Every day | ORAL | Status: DC
  Administered 2023-08-12 – 2023-08-13 (×2): 0.125 mg via ORAL
  Filled 2023-08-12 (×3): qty 1

## 2023-08-12 MED ORDER — HEMOSTATIC AGENTS (NO CHARGE) OPTIME
TOPICAL | Status: DC | PRN
  Administered 2023-08-12: 1 via TOPICAL

## 2023-08-12 MED ORDER — OXYCODONE HCL 5 MG/5ML PO SOLN: 5.0000 mg | Freq: Once | ORAL | Status: DC | PRN

## 2023-08-12 MED ORDER — ACETAMINOPHEN 10 MG/ML IV SOLN: 1000.0000 mg | Freq: Once | INTRAVENOUS | Status: DC | PRN

## 2023-08-12 MED ORDER — TOBRAMYCIN SULFATE 80 MG/2ML IJ SOLN
INTRAMUSCULAR | Status: DC | PRN
  Administered 2023-08-12: 120 mg via INTRAMUSCULAR

## 2023-08-12 MED ORDER — VANCOMYCIN HCL 500 MG IV SOLR
INTRAVENOUS | Status: DC | PRN
  Administered 2023-08-12: 500 mg via TOPICAL

## 2023-08-12 MED ORDER — FENTANYL CITRATE (PF) 100 MCG/2ML IJ SOLN
INTRAMUSCULAR | Status: AC
  Filled 2023-08-12: qty 2

## 2023-08-12 MED ORDER — PROPOFOL 1000 MG/100ML IV EMUL
INTRAVENOUS | Status: AC
  Filled 2023-08-12: qty 100

## 2023-08-12 MED ORDER — ONDANSETRON HCL 4 MG/2ML IJ SOLN
INTRAMUSCULAR | Status: DC | PRN
  Administered 2023-08-12: 4 mg via INTRAVENOUS

## 2023-08-12 MED ORDER — FENTANYL CITRATE (PF) 100 MCG/2ML IJ SOLN
25.0000 ug | INTRAMUSCULAR | Status: DC | PRN
  Administered 2023-08-12 (×2): 50 ug via INTRAVENOUS

## 2023-08-12 MED ORDER — DROPERIDOL 2.5 MG/ML IJ SOLN: 0.6250 mg | Freq: Once | INTRAMUSCULAR | Status: DC | PRN

## 2023-08-12 MED ORDER — VANCOMYCIN HCL 500 MG IV SOLR
INTRAVENOUS | Status: AC
  Filled 2023-08-12: qty 10

## 2023-08-12 MED ORDER — SODIUM CHLORIDE 0.9 % IR SOLN
Status: DC | PRN
  Administered 2023-08-12: 3000 mL

## 2023-08-12 MED ORDER — OXYCODONE HCL 5 MG PO TABS: 5.0000 mg | ORAL_TABLET | Freq: Once | ORAL | Status: DC | PRN

## 2023-08-12 MED ORDER — FENTANYL CITRATE (PF) 250 MCG/5ML IJ SOLN
INTRAMUSCULAR | Status: AC
Start: 2023-08-12 — End: ?
  Filled 2023-08-12: qty 5

## 2023-08-12 MED ORDER — SODIUM CHLORIDE 0.9 % IV SOLN
INTRAVENOUS | Status: AC
Start: 2023-08-12 — End: 2023-08-13

## 2023-08-12 MED ORDER — METHOCARBAMOL 500 MG PO TABS
500.0000 mg | ORAL_TABLET | Freq: Two times a day (BID) | ORAL | Status: DC
  Administered 2023-08-12 – 2023-08-14 (×5): 500 mg via ORAL
  Filled 2023-08-12 (×5): qty 1

## 2023-08-12 MED ORDER — PROPOFOL 500 MG/50ML IV EMUL
INTRAVENOUS | Status: DC | PRN
  Administered 2023-08-12: 75 ug/kg/min via INTRAVENOUS

## 2023-08-12 MED ORDER — VANCOMYCIN HCL 1750 MG/350ML IV SOLN
1750.0000 mg | INTRAVENOUS | Status: DC
  Administered 2023-08-12: 1750 mg via INTRAVENOUS
  Administered 2023-08-12: 1.75 mg via INTRAVENOUS
  Administered 2023-08-13: 1750 mg via INTRAVENOUS
  Filled 2023-08-12 (×3): qty 350

## 2023-08-12 MED ORDER — OXYCODONE HCL 5 MG PO TABS
10.0000 mg | ORAL_TABLET | ORAL | Status: DC | PRN
  Administered 2023-08-12 – 2023-08-13 (×4): 10 mg via ORAL
  Filled 2023-08-12 (×4): qty 2

## 2023-08-12 MED ORDER — BUPIVACAINE HCL (PF) 0.5 % IJ SOLN
INTRAMUSCULAR | Status: DC | PRN
  Administered 2023-08-12: 10 mL

## 2023-08-12 SURGICAL SUPPLY — 42 items
BEADS BIO ARTH CALC SULFAT 5CC (Bone Implant) IMPLANT
BLADE AVERAGE 25X9 (BLADE) IMPLANT
BLADE SURG 10 STRL SS (BLADE) ×1 IMPLANT
BLADE SURG 15 STRL LF DISP TIS (BLADE) ×1 IMPLANT
BNDG COHESIVE 3X5 TAN ST LF (GAUZE/BANDAGES/DRESSINGS) ×1 IMPLANT
BNDG ELASTIC 3INX 5YD STR LF (GAUZE/BANDAGES/DRESSINGS) ×1 IMPLANT
BNDG ELASTIC 4INX 5YD STR LF (GAUZE/BANDAGES/DRESSINGS) IMPLANT
BNDG ESMARK 4X9 LF (GAUZE/BANDAGES/DRESSINGS) ×1 IMPLANT
BNDG GAUZE DERMACEA FLUFF 4 (GAUZE/BANDAGES/DRESSINGS) IMPLANT
CANISTER WOUND CARE 500ML ATS (WOUND CARE) IMPLANT
CHLORAPREP W/TINT 26 (MISCELLANEOUS) IMPLANT
DRAPE DERMATAC (DRAPES) IMPLANT
DRSG ADAPTIC 3X8 NADH LF (GAUZE/BANDAGES/DRESSINGS) IMPLANT
DRSG XEROFORM 1X8 (GAUZE/BANDAGES/DRESSINGS) IMPLANT
ELECTRODE REM PT RTRN 9FT ADLT (ELECTROSURGICAL) ×1 IMPLANT
GAUZE PAD ABD 8X10 STRL (GAUZE/BANDAGES/DRESSINGS) IMPLANT
GAUZE SPONGE 2X2 STRL 8-PLY (GAUZE/BANDAGES/DRESSINGS) IMPLANT
GAUZE SPONGE 4X4 12PLY STRL (GAUZE/BANDAGES/DRESSINGS) ×1 IMPLANT
GAUZE STRETCH 2X75IN STRL (MISCELLANEOUS) ×1 IMPLANT
GAUZE XEROFORM 1X8 LF (GAUZE/BANDAGES/DRESSINGS) ×1 IMPLANT
GLOVE BIO SURGEON STRL SZ7.5 (GLOVE) ×1 IMPLANT
GLOVE BIOGEL PI IND STRL 7.5 (GLOVE) ×1 IMPLANT
GOWN STRL REUS W/ TWL LRG LVL3 (GOWN DISPOSABLE) ×2 IMPLANT
KIT BASIN OR (CUSTOM PROCEDURE TRAY) ×1 IMPLANT
NDL BIOPSY JAMSHIDI 8X6 (NEEDLE) IMPLANT
NDL HYPO 25X1 1.5 SAFETY (NEEDLE) ×1 IMPLANT
NEEDLE BIOPSY JAMSHIDI 8X6 (NEEDLE) IMPLANT
NEEDLE HYPO 25X1 1.5 SAFETY (NEEDLE) ×1 IMPLANT
PACK ORTHO EXTREMITY (CUSTOM PROCEDURE TRAY) ×1 IMPLANT
PADDING CAST ABS COTTON 4X4 ST (CAST SUPPLIES) ×2 IMPLANT
POWDER SURGICEL 3.0 GRAM (HEMOSTASIS) IMPLANT
SET HNDPC FAN SPRY TIP SCT (DISPOSABLE) IMPLANT
SPIKE FLUID TRANSFER (MISCELLANEOUS) IMPLANT
STOCKINETTE 4X48 STRL (DRAPES) IMPLANT
SUT ETHILON 3 0 FSLX (SUTURE) IMPLANT
SUT PROLENE 3 0 PS 2 (SUTURE) IMPLANT
SUT PROLENE 4 0 PS 2 18 (SUTURE) IMPLANT
SYR CONTROL 10ML LL (SYRINGE) ×1 IMPLANT
TUBE CONNECTING 12X1/4 (SUCTIONS) IMPLANT
UNDERPAD 30X36 HEAVY ABSORB (UNDERPADS AND DIAPERS) ×1 IMPLANT
WATER STERILE IRR 1000ML POUR (IV SOLUTION) ×1 IMPLANT
YANKAUER SUCT BULB TIP NO VENT (SUCTIONS) IMPLANT

## 2023-08-12 NOTE — Anesthesia Postprocedure Evaluation (Signed)
 Anesthesia Post Note  Patient: Justin Hall  Procedure(s) Performed: AMPUTATION, FOOT, RAY (Left)     Patient location during evaluation: PACU Anesthesia Type: MAC Level of consciousness: awake and alert Pain management: pain level controlled Vital Signs Assessment: post-procedure vital signs reviewed and stable Respiratory status: spontaneous breathing, nonlabored ventilation, respiratory function stable and patient connected to nasal cannula oxygen Cardiovascular status: stable and blood pressure returned to baseline Postop Assessment: no apparent nausea or vomiting Anesthetic complications: no   No notable events documented.  Last Vitals:  Vitals:   08/12/23 1600 08/12/23 1700  BP: (!) 130/96 (!) 157/91  Pulse: 83 89  Resp: 10 18  Temp: (P) 36.6 C 37.1 C  SpO2: 91% (!) 82%    Last Pain:  Vitals:   08/12/23 1618  TempSrc:   PainSc: 0-No pain                 Lethaniel Rave

## 2023-08-12 NOTE — Anesthesia Preprocedure Evaluation (Signed)
 Anesthesia Evaluation  Patient identified by MRN, date of birth, ID band Patient awake    Reviewed: Allergy & Precautions, NPO status , Patient's Chart, lab work & pertinent test results  History of Anesthesia Complications Negative for: history of anesthetic complications  Airway Mallampati: II  TM Distance: >3 FB Neck ROM: Full    Dental no notable dental hx.    Pulmonary shortness of breath, sleep apnea    Pulmonary exam normal breath sounds clear to auscultation       Cardiovascular hypertension, Pt. on medications + CAD and +CHF  Normal cardiovascular exam Rhythm:Regular Rate:Normal  09/2018  IMPRESSIONS     1. The left ventricle has low normal systolic function, with an ejection  fraction of 50-55%. The cavity size was normal. There is severely  increased left ventricular wall thickness. Left ventricular diastolic  Doppler parameters are consistent with  pseudonormalization. Left ventricular diffuse hypokinesis.   2. The right ventricle has normal systolic function. The cavity was  normal. There is no increase in right ventricular wall thickness.   3. There is moderate mitral annular calcification present.   4. The inferior vena cava was dilated in size with <50% respiratory  variability.     Neuro/Psych  PSYCHIATRIC DISORDERS Anxiety Depression       GI/Hepatic   Endo/Other  diabetes, Type 2    Renal/GU Renal disease     Musculoskeletal  (+) Arthritis ,  Osteomyelitis    Abdominal   Peds  Hematology   Anesthesia Other Findings   Reproductive/Obstetrics                              Anesthesia Physical Anesthesia Plan  ASA: 3  Anesthesia Plan: MAC   Post-op Pain Management:    Induction: Intravenous  PONV Risk Score and Plan: 1 and Ondansetron , Treatment may vary due to age or medical condition and TIVA  Airway Management Planned: Natural Airway and Simple Face  Mask  Additional Equipment:   Intra-op Plan:   Post-operative Plan:   Informed Consent: I have reviewed the patients History and Physical, chart, labs and discussed the procedure including the risks, benefits and alternatives for the proposed anesthesia with the patient or authorized representative who has indicated his/her understanding and acceptance.       Plan Discussed with: CRNA and Surgeon  Anesthesia Plan Comments:          Anesthesia Quick Evaluation

## 2023-08-12 NOTE — Op Note (Signed)
 Full Operative Report  Date of Operation: 3:38 PM, 08/12/2023   Patient: Justin Hall - 58 y.o. male  Surgeon: Evertt Hoe, DPM   Assistant: None  Diagnosis: Osteomyelitis of left foot  Procedure:  1. ***    Anesthesia: Monitor Anesthesia Care  No responsible provider has been recorded for the case.  Anesthesiologist: Lethaniel Rave, MD CRNA: Andee Bamberger, CRNA Student Nurse Anesthetist: Graylon Leader, RN   Estimated Blood Loss: Minimal ***  Hemostasis: 1) Anatomical dissection, mechanical compression, electrocautery 2) ***  Implants: Implant Name Type Inv. Item Serial No. Manufacturer Lot No. LRB No. Used Action  BEADS BIO ARTH CALC SULFAT 5CC - [id number] Bone Implant BEADS BIO ARTH CALC SULFAT 5CC  [employer] [id number] Left 1 Implanted    Materials: ***  Injectables: 1) Pre-operatively: *** cc of 50:50 mixture 1%lidocaine  plain and 0.5% marcaine  plain 2) Post-operatively: None ***  Specimens: Pathology: ***  Microbiology: ***   Antibiotics: IV antibiotics given per schedule on the floor  Drains: None  Complications: Patient tolerated the procedure well without complication.   Operative findings: As below in detailed report  Indications for Procedure: Layten Aiken presents to Evertt Hoe, DPM with a chief complaint of *** The patient has failed conservative treatments of various modalities. At this time the patient has elected to proceed with surgical correction. All alternatives, risks, and complications of the procedures were thoroughly explained to the patient. Patient exhibits appropriate understanding of all discussion points and informed consent was signed and obtained in the chart with no guarantees to surgical outcome given or implied.  Description of Procedure: Patient was brought to the operating room. Patient remained on their hospital bed in the supine position. A surgical timeout was performed and all members of the  operating room, the procedure, and the surgical site were identified. anesthesia occurred as per anesthesia record. Local anesthetic as previously described was then injected about the operative field in a local infiltrative block.  The operative lower extremity as noted above was then prepped and draped in the usual sterile manner. The following procedure then began.  ***  The surgical site was then dressed with ***. The patient tolerated both the procedure and anesthesia well with vital signs stable throughout. The patient was transferred in good condition and all vital signs stable  from the OR to recovery under the discretion of anesthesia.  Condition: Vital signs stable, neurovascular status unchanged from preoperative   Surgical plan:  ***  The patient will be *** in a *** to the operative limb until further instructed. The dressing is to remain clean, dry, and intact. Will continue to follow unless noted elsewhere.   Russ Course, DPM [facility]

## 2023-08-12 NOTE — Progress Notes (Signed)
 Pharmacy Antibiotic Note  Justin Hall is a 58 y.o. male admitted on 08/10/2023 with  wound infection .  MRI reveals osteo and abscess.  Pharmacy has been consulted for vancomycin  and Zosyn  dosing.  Renal function worsening - SCr up to 1.61, afebrile, WBC up to 10.6  Plan: Reduce vanc to 1750mg  IV Q24H for AUC 530 using SCr 1.61 Continue Zosyn  EID 3.375gm IV Q8H Monitor renal fxn, clinical progress, vanc levels at Css if abx con't post procedure   Height: 5\' 9"  (175.3 cm) Weight: (!) 147 kg (324 lb 1.2 oz) IBW/kg (Calculated) : 70.7  Temp (24hrs), Avg:98.3 F (36.8 C), Min:98 F (36.7 C), Max:98.6 F (37 C)  Recent Labs  Lab 08/10/23 1247 08/10/23 1427 08/11/23 0641 08/12/23 0718  WBC 10.5  --  10.5 10.6*  CREATININE 1.37*  --  1.31* 1.61*  LATICACIDVEN 2.0* 1.2  --   --     Estimated Creatinine Clearance: 69.8 mL/min (A) (by C-G formula based on SCr of 1.61 mg/dL (H)).    No Known Allergies  Vanc 4/21 >> Zosyn  4/21 >>  4/21 BCx - NGTD  Seferina Brokaw D. Marikay Show, PharmD, BCPS, BCCCP 08/12/2023, 10:35 AM

## 2023-08-12 NOTE — Progress Notes (Signed)
 Patient ID: Justin Hall, male   DOB: 24-Jan-1966, 58 y.o.   MRN: [id number]   Patient reported to nurse manager that he had a fall. Upon further assessment, he states he bumped his head  on the pipe above the toilet. When asked how hard he hit it , he stated not very hard. Was dismissive of getting a scan. Reported to MD.

## 2023-08-12 NOTE — Progress Notes (Signed)
 History and Physical Interval Note:  08/12/2023 2:25 PM  Justin Hall  has presented today for surgery, with the diagnosis of osteomyelitis of the 5th ray left foot.  The various methods of treatment have been discussed with the patient and family. After consideration of risks, benefits and other options for treatment, the patient has consented to   Procedure(s) with comments: AMPUTATION, FOOT, RAY (Left) - Partial 5th ray amputaiton, wound vac, abx beads as a surgical intervention.  The patient's history has been reviewed, patient examined, no change in status, stable for surgery.  I have reviewed the patient's chart and labs.  Questions were answered to the patient's satisfaction.     Karlene Overcast Preslie Depasquale

## 2023-08-12 NOTE — Transfer of Care (Signed)
 Immediate Anesthesia Transfer of Care Note  Patient: Justin Hall  Procedure(s) Performed: AMPUTATION, FOOT, RAY (Left)  Patient Location: PACU  Anesthesia Type:MAC  Level of Consciousness: awake, oriented, and patient cooperative  Airway & Oxygen Therapy: Patient Spontanous Breathing and Patient connected to face mask oxygen  Post-op Assessment: Report given to RN and Post -op Vital signs reviewed and stable  Post vital signs: Reviewed and stable  Last Vitals:  Vitals Value Taken Time  BP 143/81 08/12/23 1522  Temp    Pulse 81 08/12/23 1524  Resp 25 08/12/23 1522  SpO2 87 % 08/12/23 1524  Vitals shown include unfiled device data.  Last Pain:  Vitals:   08/12/23 1431  TempSrc:   PainSc: 0-No pain      Patients Stated Pain Goal: 0 (08/12/23 1431)  Complications: No notable events documented.

## 2023-08-12 NOTE — Progress Notes (Signed)
 TRH ROUNDING NOTE Justin Hall ZOX:[id number]  DOB: July 05, 1965  DOA: 08/10/2023  PCP: Jobe Mulder, DO  08/12/2023,8:08 AM  LOS: 2 days    Code Status: Full code   from: Home current Dispo: Possible skilled   58 year old manage BMI 47 DM 2 x 2 HLD gout OSA on CPAP bipolar gout Chronic chest pain with negative cardiac cath 2019--previous admission worsening diastolic dysfunction-osteoarthritis left hip with progressively worsening pain and dysfunction status post total hip arthroplasty left side 01/2020  4/21 present med [facility] after being seen 4/8 PCP with cellulitis of foot and ulcer lateral aspect of fifth toe at metatarsal phalangeal joint was prescribed 10-day course doxycycline -significantly worsened new swelling erythema foul discharge and came to be evaluated--this occurred on dorsal aspect of foot Sodium 136 potassium 4.3 BUN/creatinine 34/1.37 lactic acid 2.0 CRP 16--WBC 10.5 hemoglobin 11.3 platelet 252 Foot x-ray left side soft tissue swelling with 5 metatarsal phalangeal joint compatible with infection moderate soft tissue swelling.  Toes worsened compared to prior focal lucencies lateral aspect head of fifth metatarsal indeterminate-osteomyelitis not excluded MRI foot osteomyelitis fifth metatarsal head distal half of fifth metatarsal shaft with probable adjacent osteo within fifth proximal phalanx extensive inflammatory changes with ill-defined peripherally enhancing fluid collections wrapping around plantar lateral aspect fifth metatarsal head.  Degenerative changes as first metatarsophalangeal joint throughout midfoot Started on Zosyn  and vancomycin  in the ED 4/22 Dr. Rosemarie Conquest consulted planning surgery  Plan  Left foot wound dorsal and plantar aspect of fifth metatarsal with inflammatory soft tissue infection--failed outpatient management with progression of disease NS 40 cc/H as n.p.o.-continue vancomycin  and Zosyn  for now low threshold to switch to  cefepime  Pain control Oxy IR 5-10 every 4 as needed moderate pain, Dilaudid  IV 0.5 every 4 as needed breakthrough Can use Robaxin  500 twice daily for pain control  AKI PTA on Lasix  40 twice daily, lisinopril  20--now held-fluids as above-watch creatinine  DM TY 2 Metformin  1 g twice daily on hold--gabapentin  300 3 times daily to be resumed but may need to adjust going forward PCP was adjusting Mounjaro as an outpatient and will need to resume this when he is seen  HTN Diastolic heart failure Coreg  6.25 twice daily resumed--Lasix  held for now resume either outpatient or closer to discharge  Anxiety-May use hydroxyzine  25 3 times daily as needed for anxiety  DVT prophylaxis: Lovenox  subcu  Status is: Inpatient Remains inpatient appropriate because:   Requires definitive management   Subjective:  Anxious no distress--he reported a fall last pm after I had rounded on him [place] about his restless leg meds and pain meds No nausea no vomit  Objective + exam Vitals:   08/11/23 1428 08/11/23 2101 08/12/23 0449 08/12/23 0521  BP: (!) 158/87 (!) 155/137 (!) 198/109 (!) 166/94  Pulse: 88 95 94 90  Resp: 16 16 15 16   Temp: 98.6 F (37 C) 98 F (36.7 C) 98.5 F (36.9 C)   TempSrc:  Oral    SpO2: 91% 97% 96%   Weight:      Height:       Filed Weights   08/10/23 1756  Weight: (!) 147 kg    Examination: Anxious appearing Mallampatti 4 S1 s2 no m Abd obese nt nd no rebound no guard ROM intact wound not examined  Data Reviewed: reviewed   CBC    Component Value Date/Time   WBC 10.5 08/11/2023 0641   RBC 4.22 08/11/2023 0641   HGB 11.1 (L) 08/11/2023 8119  HGB 12.4 (L) 07/14/2019 1247   HCT 35.1 (L) 08/11/2023 0641   HCT 37.5 07/14/2019 1247   PLT 254 08/11/2023 0641   PLT 281 07/14/2019 1247   MCV 83.2 08/11/2023 0641   MCV 82 07/14/2019 1247   MCH 26.3 08/11/2023 0641   MCHC 31.6 08/11/2023 0641   RDW 15.0 08/11/2023 0641   RDW 14.6 07/14/2019 1247    LYMPHSABS 1.4 08/10/2023 1247   LYMPHSABS 3.3 (H) 07/14/2019 1247   MONOABS 0.8 08/10/2023 1247   EOSABS 0.4 08/10/2023 1247   EOSABS 0.5 (H) 07/14/2019 1247   BASOSABS 0.0 08/10/2023 1247   BASOSABS 0.1 07/14/2019 1247      Latest Ref Rng & Units 08/11/2023    6:41 AM 08/10/2023   12:47 PM 04/07/2023    8:30 AM  CMP  Glucose 70 - 99 mg/dL 161  096  045   BUN 6 - 20 mg/dL 25  34  30   Creatinine 0.61 - 1.24 mg/dL 4.09  8.11  9.14   Sodium 135 - 145 mmol/L 137  136  139   Potassium 3.5 - 5.1 mmol/L 4.6  4.3  4.9   Chloride 98 - 111 mmol/L 101  99  97   CO2 22 - 32 mmol/L 25  27  36   Calcium  8.9 - 10.3 mg/dL 8.4  8.8  8.8   Total Protein 6.5 - 8.1 g/dL  7.6  7.6   Total Bilirubin 0.0 - 1.2 mg/dL  0.6  0.6   Alkaline Phos 38 - 126 U/L  70  97   AST 15 - 41 U/L  17  11   ALT 0 - 44 U/L  14  8     Scheduled Meds:  allopurinol   300 mg Oral BID   atorvastatin   80 mg Oral Daily   carvedilol   6.25 mg Oral BID WC   enoxaparin  (LOVENOX ) injection  70 mg Subcutaneous Q24H   gabapentin   300 mg Oral TID   insulin  aspart  0-15 Units Subcutaneous TID WC   insulin  aspart  0-5 Units Subcutaneous QHS   methocarbamol   500 mg Oral BID   pramipexole   0.125 mg Oral QHS   rOPINIRole   4 mg Oral q AM   rOPINIRole   8 mg Oral QHS   senna-docusate  2 tablet Oral BID   sodium chloride  flush  3 mL Intravenous Q12H   Continuous Infusions:  piperacillin -tazobactam (ZOSYN )  IV 3.375 g (08/12/23 0520)   Vancomycin  (VANCOCIN ) 1,250 mg in sodium chloride  0.9 % 250 mL IVPB 1,250 mg (08/11/23 2030)    Time  35  Justin Glisson, MD  [facility]

## 2023-08-12 NOTE — Progress Notes (Signed)
 Patient ID: Justin Hall, male   DOB: 08/19/1965, 58 y.o.   MRN: [id number]  Upon patient arrival back to unit, PACU RN stated he had some oozing of thick clots out of the sides of the tegaderm over the surgical site/wound vac site. She stated they had redone the dressing but it was still oozing a bit.  Called to room because wife noted some additional bleeding. Surgeon called (Dr. Rosemarie Conquest) Instructed to apply gauze and padding liberally to reinforce and cover with an ace wrap. He will be by to look at it in the morning.  Genella Kendall, RN

## 2023-08-13 ENCOUNTER — Encounter (HOSPITAL_COMMUNITY): Payer: Self-pay | Admitting: Podiatry

## 2023-08-13 DIAGNOSIS — L97529 Non-pressure chronic ulcer of other part of left foot with unspecified severity: Secondary | ICD-10-CM | POA: Diagnosis not present

## 2023-08-13 DIAGNOSIS — E11621 Type 2 diabetes mellitus with foot ulcer: Secondary | ICD-10-CM | POA: Diagnosis not present

## 2023-08-13 LAB — CBC WITH DIFFERENTIAL/PLATELET
Abs Immature Granulocytes: 0.07 10*3/uL (ref 0.00–0.07)
Basophils Absolute: 0.1 10*3/uL (ref 0.0–0.1)
Basophils Relative: 1 %
Eosinophils Absolute: 0.5 10*3/uL (ref 0.0–0.5)
Eosinophils Relative: 4 %
HCT: 32.2 % — ABNORMAL LOW (ref 39.0–52.0)
Hemoglobin: 10.1 g/dL — ABNORMAL LOW (ref 13.0–17.0)
Immature Granulocytes: 1 %
Lymphocytes Relative: 21 %
Lymphs Abs: 2.5 10*3/uL (ref 0.7–4.0)
MCH: 26 pg (ref 26.0–34.0)
MCHC: 31.4 g/dL (ref 30.0–36.0)
MCV: 83 fL (ref 80.0–100.0)
Monocytes Absolute: 1.2 10*3/uL — ABNORMAL HIGH (ref 0.1–1.0)
Monocytes Relative: 10 %
Neutro Abs: 7.7 10*3/uL (ref 1.7–7.7)
Neutrophils Relative %: 63 %
Platelets: 271 10*3/uL (ref 150–400)
RBC: 3.88 MIL/uL — ABNORMAL LOW (ref 4.22–5.81)
RDW: 15.1 % (ref 11.5–15.5)
WBC: 12 10*3/uL — ABNORMAL HIGH (ref 4.0–10.5)
nRBC: 0 % (ref 0.0–0.2)

## 2023-08-13 LAB — GLUCOSE, CAPILLARY
Glucose-Capillary: 164 mg/dL — ABNORMAL HIGH (ref 70–99)
Glucose-Capillary: 189 mg/dL — ABNORMAL HIGH (ref 70–99)
Glucose-Capillary: 167 mg/dL — ABNORMAL HIGH (ref 70–99)
Glucose-Capillary: 182 mg/dL — ABNORMAL HIGH (ref 70–99)

## 2023-08-13 LAB — BASIC METABOLIC PANEL WITH GFR
Anion gap: 12 (ref 5–15)
BUN: 28 mg/dL — ABNORMAL HIGH (ref 6–20)
CO2: 27 mmol/L (ref 22–32)
Calcium: 8.3 mg/dL — ABNORMAL LOW (ref 8.9–10.3)
Chloride: 97 mmol/L — ABNORMAL LOW (ref 98–111)
Creatinine, Ser: 1.65 mg/dL — ABNORMAL HIGH (ref 0.61–1.24)
GFR, Estimated: 47 mL/min — ABNORMAL LOW (ref >60)
Glucose, Bld: 169 mg/dL — ABNORMAL HIGH (ref 70–99)
Potassium: 3.9 mmol/L (ref 3.5–5.1)
Sodium: 136 mmol/L (ref 135–145)

## 2023-08-13 MED ORDER — OXYCODONE HCL 5 MG PO TABS
10.0000 mg | ORAL_TABLET | ORAL | Status: DC | PRN
  Administered 2023-08-13 – 2023-08-17 (×19): 10 mg via ORAL
  Filled 2023-08-13 (×20): qty 2

## 2023-08-13 MED ORDER — MUPIROCIN 2 % EX OINT
1.0000 | TOPICAL_OINTMENT | Freq: Two times a day (BID) | CUTANEOUS | Status: DC
  Administered 2023-08-14 – 2023-08-17 (×6): 1 via NASAL
  Filled 2023-08-13 (×2): qty 22

## 2023-08-13 MED ORDER — SODIUM CHLORIDE 0.9 % IV SOLN: INTRAVENOUS | Status: AC

## 2023-08-13 MED ORDER — AMLODIPINE BESYLATE 5 MG PO TABS
5.0000 mg | ORAL_TABLET | Freq: Every day | ORAL | Status: DC
  Administered 2023-08-13 – 2023-08-17 (×5): 5 mg via ORAL
  Filled 2023-08-13 (×5): qty 1

## 2023-08-13 NOTE — Progress Notes (Signed)
  Subjective:  Patient ID: Justin Hall, male    DOB: October 27, 1965,  MRN: [id number]  Chief Complaint  Patient presents with   Cellulitis    DOS: 08/12/2023 Procedure: 1.  Partial fifth ray amputation, left foot 2.  Application antibiotic dissolvable beads, left foot 3.  Application dermal allograft 38 cm, left foot  58 y.o. male seen for post op check.  He reports some burning pain in the surgical area about 7-8 out of 10.  Discussed findings from the operation yesterday as well as plans for tomorrow, he is in agreement to proceed.  Review of Systems: Negative except as noted in the HPI. Denies N/V/F/Ch.   Objective:   Vitals:   08/13/23 0523 08/13/23 0836  BP: (!) 161/91 (!) 156/89  Pulse: 93 92  Resp: 18 16  Temp:  98.2 F (36.8 C)  SpO2: (P) 95% 96%   Body mass index is 47.86 kg/m. Constitutional Well developed. Well nourished.  Vascular Foot warm and well perfused. Capillary refill normal to all digits.   No calf pain with palpation  Neurologic Normal speech. Oriented to person, place, and time. Epicritic sensation grossly intact to the left lower extremity  Dermatologic Wound VAC is intact and functioning with approximately 150 to 200 mL serosanguineous in canister -no strikethrough on current dressing  Orthopedic: Status post partial fifth ray amputation left foot with large soft tissue deficit   Radiographs: Status post interval amputation fifth ray at the level of the proximal fifth met shaft with antibiotic beads placed  Pathology: Pending  Micro: Rare GPC in pairs pending  Assessment:   Osteomyelitis of fifth ray status post partial fifth ray amputation  Plan:  Patient was evaluated and treated and all questions answered.  POD # 1 s/p partial fifth ray amputation with antibiotic beads and VAC application -Plan to return to the operating room tomorrow for repeat irrigation and debridement the surgical site with graft and VAC application.  He will need  long-term wound VAC therapy -N.p.o. past midnight for OR tomorrow morning.  Lovenox  held -Recommend CM consult for home VAC and home health for dressing changes -XR: Expected postop changes -WB Status: Nonweightbearing in postop shoe -Sutures: None present -Medications/ABX: Continue broad-spectrum antibiotic therapy follow cultures - Leave current dressing clean dry and intact and wound VAC functioning until OR tomorrow.  Appreciate RN assistance with dressing change/reinforcement yesterday.        Maridee Shoemaker, DPM [facility] / [facility]

## 2023-08-13 NOTE — TOC Initial Note (Addendum)
 Transition of Care [facility]) - Initial/Assessment Note    Patient Details  Name: Justin Hall MRN: [id number] Date of Birth: 04-Feb-1966  Transition of Care [facility]) CM/SW Contact:    Alisa App, RN Phone Number: 08/13/2023, 4:15 PM  Clinical Narrative:                 Cellulitis / osteomyelitis of L foot.    -s/p partial fifth ray amputation, left foot, 4/23 From home with wife . PTA independent with ADL's, no DME usage. Per provider pt will require repeat irrigation debridement of the ulceration, graft application and repeat wound VAC application. Pt will need long-term VAC assisted healing of the surgical site.  Pt agreeable to home health services. Pt without provider preference.  Referral made with Artavia  with [facility] for [facility] services.  Referral made with Sherrlyn Dolores with [employer] for potential home VAC need... Sherrlyn Dolores following.  4/27- ID consulted transitioned antibiotics to Cubicin -likely 4 weeks therapy ending 5/19.  TOC team will continue following and assisting with TOC needs...  Expected Discharge Plan: Home w Home Health Services Barriers to Discharge: Continued Medical Work up   Patient Goals and CMS Choice     Choice offered to / list presented to : Patient      Expected Discharge Plan and Services   Discharge Planning Services: CM Consult   Living arrangements for the past 2 months: Single Family Home                   DME Agency: Sharol Decamp Esteban Heinrich) Date DME Agency Contacted: 08/13/23 Time DME Agency Contacted: [phone] Representative spoke with at DME Agency: Sherrlyn Dolores HH Arranged: RN HH Agency: [facility] Date HH Agency Contacted: 08/13/23 Time HH Agency Contacted: [phone] Representative spoke with at [facility] Agency: Renetta Carter  Prior Living Arrangements/Services Living arrangements for the past 2 months: Single Family Home Lives with:: Spouse Patient language and need for interpreter reviewed:: Yes Do you feel safe going back to the place  where you live?: Yes      Need for Family Participation in Patient Care: Yes (Comment) Care giver support system in place?: Yes (comment)      Activities of Daily Living   ADL Screening (condition at time of admission) Independently performs ADLs?: Yes (appropriate for developmental age) Is the patient deaf or have difficulty hearing?: No Does the patient have difficulty seeing, even when wearing glasses/contacts?: No Does the patient have difficulty concentrating, remembering, or making decisions?: No  Permission Sought/Granted   Permission granted to share information with : Yes, Verbal Permission Granted  Share Information with NAME: Malike Buenafe  Spouse  [phone]           Emotional Assessment Appearance:: Appears stated age     Orientation: : Oriented to Self, Oriented to Place, Oriented to  Time, Oriented to Situation Alcohol  / Substance Use: Not Applicable Psych Involvement: No (comment)  Admission diagnosis:  Diabetic foot ulcer ([name]) [A35.573, L97.509] Diabetic foot infection ([name]) [U20.254, L08.9] Patient Active Problem List   Diagnosis Date Noted   Pyogenic inflammation of bone ([name]) 08/11/2023   Diabetic foot ulcer ([name]) 08/10/2023   CKD (chronic kidney disease) stage 1, GFR 90 ml/min or greater 08/10/2023   Dyslipidemia 08/10/2023   Pain 08/10/2023   Mixed hyperlipidemia 05/15/2023   Medication monitoring encounter 03/22/2020   Postop check    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) 02/23/2020   OA (osteoarthritis) of hip 01/23/2020   Body mass index  40.0-44.9, adult ([name]) 03/08/2019   Uncontrolled type 2 diabetes mellitus with hyperglycemia ([name]) 01/28/2019   Primary osteoarthritis of left hip 01/28/2019   Diastolic dysfunction    Acute exacerbation of CHF (congestive heart failure) ([name]) 10/17/2018   Coronary artery disease 10/17/2018   Gout flare 05/04/2018   Psychophysiological insomnia 01/25/2018   Bunion of great toe 11/18/2017   Morbid  obesity ([name]) 08/12/2017   Essential hypertension 08/12/2017   Chronic gout without tophus 08/12/2017   Chest pain 06/01/2017   Hypertensive urgency 06/01/2017   DM2 (diabetes mellitus, type 2) ([name]) 06/01/2017   S/P laparoscopic hernia repair 02/27/2014   Restless leg syndrome    PCP:  Jobe Mulder, DO Pharmacy:   [id number] [place], [place] - [address] AT [facility] OF [address] [place] [address] Phone: [phone] Fax: [phone]  [id number] [place],  - [address] [place] [address] Phone: [phone] Fax: [phone]  [facility] [address] [place], [place] - [address] [place] [address] Phone: [phone] Fax: [phone]  [facility] - [place], [place] - [address] [place] [address] Phone: [phone] Fax: [phone]     Social Drivers of Health (SDOH) Social History: SDOH Screenings   Food Insecurity: No Food Insecurity (08/10/2023)  Recent Concern: Food Insecurity - Food Insecurity Present (05/14/2023)  Housing: Low Risk  (08/10/2023)  Transportation Needs: No Transportation Needs (08/10/2023)  Utilities: Not At Risk (08/10/2023)  Alcohol  Screen: Low Risk  (05/14/2023)  Depression (PHQ2-9): Low Risk  (05/15/2023)  Financial Resource Strain: Medium Risk (05/14/2023)  Physical Activity: Insufficiently Active (05/14/2023)  Social Connections: Moderately Isolated (05/14/2023)  Stress: Stress Concern Present (05/14/2023)  Tobacco Use: Low Risk  (08/12/2023)   SDOH Interventions:     Readmission Risk Interventions     No data to display

## 2023-08-13 NOTE — Progress Notes (Signed)
   08/13/23 2246  BiPAP/CPAP/SIPAP  $ Non-Invasive Home Ventilator  Initial  $ Face Mask Large  Yes  BiPAP/CPAP/SIPAP Pt Type Adult  BiPAP/CPAP/SIPAP Resmed  Mask Type Full face mask  Dentures removed? Not applicable  Mask Size Large  Respiratory Rate 18 breaths/min  PEEP 5 cmH20  Flow Rate 2 lpm  Patient Home Machine No  Patient Home Mask No  Patient Home Tubing No  Auto Titrate No  Device Plugged into RED Power Outlet Yes  BiPAP/CPAP /SiPAP Vitals  Pulse Rate 81  Resp 18  SpO2 95 %  Bilateral Breath Sounds Clear

## 2023-08-13 NOTE — Plan of Care (Signed)

## 2023-08-13 NOTE — Progress Notes (Signed)
 [facility] ROUNDING NOTE Justin Hall ZOX:[id number]  DOB: April 24, 1965  DOA: 08/10/2023  PCP: Jobe Mulder, DO  08/13/2023,1:57 PM  LOS: 3 days    Code Status: Full code   from: Home current Dispo: Possible skilled   58 year old manage BMI 47 DM 2 x 2 HLD gout OSA on CPAP bipolar gout Chronic chest pain with negative cardiac cath 2019--previous admission worsening diastolic dysfunction-osteoarthritis left hip with progressively worsening pain and dysfunction status post total hip arthroplasty left side 01/2020  4/21 present med [facility] after being seen 4/8 PCP with cellulitis of foot and ulcer lateral aspect of fifth toe at metatarsal phalangeal joint was prescribed 10-day course doxycycline -significantly worsened new swelling erythema foul discharge and came to be evaluated--this occurred on dorsal aspect of foot Sodium 136 potassium 4.3 BUN/creatinine 34/1.37 lactic acid 2.0 CRP 16--WBC 10.5 hemoglobin 11.3 platelet 252 Foot x-ray left side soft tissue swelling with 5 metatarsal phalangeal joint compatible with infection moderate soft tissue swelling.  Toes worsened compared to prior focal lucencies lateral aspect head of fifth metatarsal indeterminate-osteomyelitis not excluded MRI foot osteomyelitis fifth metatarsal head distal half of fifth metatarsal shaft with probable adjacent osteo within fifth proximal phalanx extensive inflammatory changes with ill-defined peripherally enhancing fluid collections wrapping around plantar lateral aspect fifth metatarsal head.  Degenerative changes as first metatarsophalangeal joint throughout midfoot Started on Zosyn  and vancomycin  in the ED 4/22 Dr. Rosemarie Conquest consulted --s/p initial debridement  Plan  Left foot wound dorsal and plantar aspect of fifth metatarsal with inflammatory soft tissue infection--failed outpatient management with progression of disease--4/22 debridement partial left foot fifth ray amputation with antibiotic dissolvable  beads and dermal allograft 38 cm Pain control Oxy IR 10 every 3 as needed moderate pain has not controlled., Dilaudid  0.5 every 4 as needed breakthrough Continues vancomycin  1.75 g every 24, Zosyn  3.375 IV every 8 Continues on saline  AKI PTA on Lasix  40 twice daily, lisinopril  20--now held-NS 100 cc/H and holding indefinitely diuretics  DM TY 2 Metformin  1 g twice daily on hold--gabapentin  300 3 times daily to be resumed but may need to adjust going forward PCP was adjusting Mounjaro as an outpatient and will need to resume this when he is seen as outpatient CBGs 180-190 on sliding scale alone so we will keep monitoring and adjust as needed  HTN Diastolic heart failure Coreg  6.25 twice daily resumed--Lasix  held for now resume either outpatient or closer to discharge  [place] for OHSS Order overnight CPAP  Anxiety-May use hydroxyzine  25 3 times daily as needed for anxiety  DVT prophylaxis: Lovenox  subcu  Status is: Inpatient Remains inpatient appropriate because:   Requires definitive management   Subjective:  Pain not controlled overnight still about a 7-8/10 dose down to 5 He is able to fall asleep with it No chest pain no fever ROM intact   Objective + exam Vitals:   08/12/23 2145 08/13/23 0510 08/13/23 0523 08/13/23 0836  BP: (!) 154/92 (!) 187/98 (!) 161/91 (!) 156/89  Pulse: 88 98 93 92  Resp:  17 18 16   Temp:  98.8 F (37.1 C)  98.2 F (36.8 C)  TempSrc:    Oral  SpO2:  (!) 73% (P) 95% 96%  Weight:      Height:       Filed Weights   08/10/23 1756  Weight: (!) 147 kg    Examination:   Less anxious appearing morbidly obese  S1-S2 no murmur Chest clear Neck soft supple ROM intact Left lower  extremity bandaged with Kerlix on wound  Data Reviewed: reviewed   CBC    Component Value Date/Time   WBC 12.0 (H) 08/13/2023 0625   RBC 3.88 (L) 08/13/2023 0625   HGB 10.1 (L) 08/13/2023 0625   HGB 12.4 (L) 07/14/2019 1247   HCT 32.2 (L) 08/13/2023  0625   HCT 37.5 07/14/2019 1247   PLT 271 08/13/2023 0625   PLT 281 07/14/2019 1247   MCV 83.0 08/13/2023 0625   MCV 82 07/14/2019 1247   MCH 26.0 08/13/2023 0625   MCHC 31.4 08/13/2023 0625   RDW 15.1 08/13/2023 0625   RDW 14.6 07/14/2019 1247   LYMPHSABS 2.5 08/13/2023 0625   LYMPHSABS 3.3 (H) 07/14/2019 1247   MONOABS 1.2 (H) 08/13/2023 0625   EOSABS 0.5 08/13/2023 0625   EOSABS 0.5 (H) 07/14/2019 1247   BASOSABS 0.1 08/13/2023 0625   BASOSABS 0.1 07/14/2019 1247      Latest Ref Rng & Units 08/13/2023    6:25 AM 08/12/2023    7:18 AM 08/11/2023    6:41 AM  CMP  Glucose 70 - 99 mg/dL 621  308  657   BUN 6 - 20 mg/dL 28  24  25    Creatinine 0.61 - 1.24 mg/dL 8.46  9.62  9.52   Sodium 135 - 145 mmol/L 136  138  137   Potassium 3.5 - 5.1 mmol/L 3.9  4.2  4.6   Chloride 98 - 111 mmol/L 97  96  101   CO2 22 - 32 mmol/L 27  30  25    Calcium  8.9 - 10.3 mg/dL 8.3  8.5  8.4   Total Protein 6.5 - 8.1 g/dL  7.5    Total Bilirubin 0.0 - 1.2 mg/dL  0.7    Alkaline Phos 38 - 126 U/L  52    AST 15 - 41 U/L  13    ALT 0 - 44 U/L  21      Scheduled Meds:  allopurinol   300 mg Oral BID   amLODipine   5 mg Oral Daily   atorvastatin   80 mg Oral Daily   carvedilol   6.25 mg Oral BID WC   gabapentin   300 mg Oral TID   insulin  aspart  0-15 Units Subcutaneous TID WC   insulin  aspart  0-5 Units Subcutaneous QHS   methocarbamol   500 mg Oral BID   pramipexole   0.125 mg Oral QHS   rOPINIRole   4 mg Oral q AM   rOPINIRole   8 mg Oral QHS   senna-docusate  2 tablet Oral BID   sodium chloride  flush  3 mL Intravenous Q12H   Continuous Infusions:  piperacillin -tazobactam (ZOSYN )  IV 3.375 g (08/13/23 0522)   vancomycin  1,750 mg (08/13/23 1303)    Time  35  Verlie Glisson, MD  [facility]

## 2023-08-13 NOTE — Care Management Important Message (Signed)
 Important Message  Patient Details  Name: Justin Hall MRN: [id number] Date of Birth: 1965-08-24   Important Message Given:  Yes - Medicare IM     Felix Host 08/13/2023, 5:04 PM

## 2023-08-14 ENCOUNTER — Encounter (HOSPITAL_COMMUNITY): Payer: Self-pay | Admitting: Internal Medicine

## 2023-08-14 ENCOUNTER — Encounter (HOSPITAL_COMMUNITY)
Admission: EM | Disposition: A | Discharge status: Home Health | Payer: Self-pay | Source: Home / Self Care | Attending: Family Medicine

## 2023-08-14 ENCOUNTER — Other Ambulatory Visit: Payer: Self-pay

## 2023-08-14 ENCOUNTER — Inpatient Hospital Stay (HOSPITAL_COMMUNITY)

## 2023-08-14 DIAGNOSIS — I2510 Atherosclerotic heart disease of native coronary artery without angina pectoris: Secondary | ICD-10-CM | POA: Diagnosis not present

## 2023-08-14 DIAGNOSIS — E11621 Type 2 diabetes mellitus with foot ulcer: Secondary | ICD-10-CM | POA: Diagnosis not present

## 2023-08-14 DIAGNOSIS — M869 Osteomyelitis, unspecified: Secondary | ICD-10-CM

## 2023-08-14 DIAGNOSIS — M86172 Other acute osteomyelitis, left ankle and foot: Secondary | ICD-10-CM | POA: Diagnosis not present

## 2023-08-14 DIAGNOSIS — L97529 Non-pressure chronic ulcer of other part of left foot with unspecified severity: Secondary | ICD-10-CM | POA: Diagnosis not present

## 2023-08-14 DIAGNOSIS — E1169 Type 2 diabetes mellitus with other specified complication: Secondary | ICD-10-CM | POA: Diagnosis not present

## 2023-08-14 DIAGNOSIS — G4730 Sleep apnea, unspecified: Secondary | ICD-10-CM

## 2023-08-14 HISTORY — PX: APPLICATION OF WOUND VAC: SHX5189

## 2023-08-14 HISTORY — PX: IRRIGATION AND DEBRIDEMENT FOOT: SHX6602

## 2023-08-14 HISTORY — PX: ALLOGRAFT APPLICATION: SHX6404

## 2023-08-14 LAB — BASIC METABOLIC PANEL WITH GFR
Anion gap: 11 (ref 5–15)
BUN: 26 mg/dL — ABNORMAL HIGH (ref 6–20)
CO2: 27 mmol/L (ref 22–32)
Calcium: 8.6 mg/dL — ABNORMAL LOW (ref 8.9–10.3)
Chloride: 100 mmol/L (ref 98–111)
Creatinine, Ser: 1.46 mg/dL — ABNORMAL HIGH (ref 0.61–1.24)
GFR, Estimated: 55 mL/min — ABNORMAL LOW (ref >60)
Glucose, Bld: 138 mg/dL — ABNORMAL HIGH (ref 70–99)
Potassium: 4.1 mmol/L (ref 3.5–5.1)
Sodium: 138 mmol/L (ref 135–145)

## 2023-08-14 LAB — CBC WITH DIFFERENTIAL/PLATELET
Abs Immature Granulocytes: 0.09 10*3/uL — ABNORMAL HIGH (ref 0.00–0.07)
Basophils Absolute: 0.1 10*3/uL (ref 0.0–0.1)
Basophils Relative: 1 %
Eosinophils Absolute: 0.7 10*3/uL — ABNORMAL HIGH (ref 0.0–0.5)
Eosinophils Relative: 5 %
HCT: 30.4 % — ABNORMAL LOW (ref 39.0–52.0)
Hemoglobin: 9.5 g/dL — ABNORMAL LOW (ref 13.0–17.0)
Immature Granulocytes: 1 %
Lymphocytes Relative: 17 %
Lymphs Abs: 2.1 10*3/uL (ref 0.7–4.0)
MCH: 26.2 pg (ref 26.0–34.0)
MCHC: 31.3 g/dL (ref 30.0–36.0)
MCV: 84 fL (ref 80.0–100.0)
Monocytes Absolute: 1.2 10*3/uL — ABNORMAL HIGH (ref 0.1–1.0)
Monocytes Relative: 9 %
Neutro Abs: 8.2 10*3/uL — ABNORMAL HIGH (ref 1.7–7.7)
Neutrophils Relative %: 67 %
Platelets: 260 10*3/uL (ref 150–400)
RBC: 3.62 MIL/uL — ABNORMAL LOW (ref 4.22–5.81)
RDW: 14.8 % (ref 11.5–15.5)
WBC: 12.3 10*3/uL — ABNORMAL HIGH (ref 4.0–10.5)
nRBC: 0 % (ref 0.0–0.2)

## 2023-08-14 LAB — SURGICAL PCR SCREEN
MRSA, PCR: POSITIVE — AB
Staphylococcus aureus: POSITIVE — AB

## 2023-08-14 LAB — GLUCOSE, CAPILLARY
Glucose-Capillary: 136 mg/dL — ABNORMAL HIGH (ref 70–99)
Glucose-Capillary: 138 mg/dL — ABNORMAL HIGH (ref 70–99)
Glucose-Capillary: 139 mg/dL — ABNORMAL HIGH (ref 70–99)
Glucose-Capillary: 162 mg/dL — ABNORMAL HIGH (ref 70–99)
Glucose-Capillary: 207 mg/dL — ABNORMAL HIGH (ref 70–99)
Glucose-Capillary: 165 mg/dL — ABNORMAL HIGH (ref 70–99)

## 2023-08-14 SURGERY — IRRIGATION AND DEBRIDEMENT FOOT
Anesthesia: General | Laterality: Left

## 2023-08-14 MED ORDER — ACETAMINOPHEN 500 MG PO TABS: 1000.0000 mg | ORAL_TABLET | Freq: Four times a day (QID) | ORAL | Status: DC | PRN

## 2023-08-14 MED ORDER — OXYCODONE HCL 5 MG PO TABS: 5.0000 mg | ORAL_TABLET | Freq: Once | ORAL | Status: DC | PRN

## 2023-08-14 MED ORDER — TOBRAMYCIN SULFATE 80 MG/2ML IJ SOLN
INTRAMUSCULAR | Status: AC
  Filled 2023-08-14: qty 4

## 2023-08-14 MED ORDER — ROPINIROLE HCL 1 MG PO TABS
8.0000 mg | ORAL_TABLET | Freq: Every day | ORAL | Status: DC
  Administered 2023-08-14 – 2023-08-17 (×4): 8 mg via ORAL
  Filled 2023-08-14 (×4): qty 8

## 2023-08-14 MED ORDER — OXYCODONE HCL 5 MG/5ML PO SOLN: 5.0000 mg | Freq: Once | ORAL | Status: DC | PRN

## 2023-08-14 MED ORDER — LIDOCAINE 2% (20 MG/ML) 5 ML SYRINGE
INTRAMUSCULAR | Status: DC | PRN
  Administered 2023-08-14: 100 mg via INTRAVENOUS

## 2023-08-14 MED ORDER — 0.9 % SODIUM CHLORIDE (POUR BTL) OPTIME
TOPICAL | Status: DC | PRN
  Administered 2023-08-14: 1000 mL

## 2023-08-14 MED ORDER — ALPRAZOLAM 0.25 MG PO TABS
0.2500 mg | ORAL_TABLET | Freq: Once | ORAL | Status: AC
  Administered 2023-08-14: 0.25 mg via ORAL
  Filled 2023-08-14: qty 1

## 2023-08-14 MED ORDER — VANCOMYCIN HCL 500 MG IV SOLR
INTRAVENOUS | Status: AC
  Filled 2023-08-14: qty 10

## 2023-08-14 MED ORDER — LIDOCAINE 2% (20 MG/ML) 5 ML SYRINGE
INTRAMUSCULAR | Status: AC
  Filled 2023-08-14: qty 5

## 2023-08-14 MED ORDER — VANCOMYCIN HCL 1000 MG IV SOLR
INTRAVENOUS | Status: AC
  Filled 2023-08-14: qty 20

## 2023-08-14 MED ORDER — CHLORHEXIDINE GLUCONATE 0.12 % MT SOLN
OROMUCOSAL | Status: AC
  Administered 2023-08-14: 15 mL via OROMUCOSAL
  Filled 2023-08-14: qty 15

## 2023-08-14 MED ORDER — ORAL CARE MOUTH RINSE: 15.0000 mL | Freq: Once | OROMUCOSAL | Status: AC

## 2023-08-14 MED ORDER — DEXTROSE 50 % IV SOLN: 1.0000 | Freq: Once | INTRAVENOUS | Status: DC

## 2023-08-14 MED ORDER — ACETAMINOPHEN 500 MG PO TABS: 1000.0000 mg | ORAL_TABLET | Freq: Once | ORAL | Status: DC

## 2023-08-14 MED ORDER — BUPIVACAINE HCL (PF) 0.5 % IJ SOLN
INTRAMUSCULAR | Status: DC | PRN
  Administered 2023-08-14: 10 mL

## 2023-08-14 MED ORDER — AMISULPRIDE (ANTIEMETIC) 5 MG/2ML IV SOLN: 10.0000 mg | Freq: Once | INTRAVENOUS | Status: DC | PRN

## 2023-08-14 MED ORDER — SODIUM CHLORIDE 0.9 % IR SOLN
Status: DC | PRN
  Administered 2023-08-14: 3000 mL

## 2023-08-14 MED ORDER — METHOCARBAMOL 500 MG PO TABS
1000.0000 mg | ORAL_TABLET | Freq: Two times a day (BID) | ORAL | Status: DC
  Administered 2023-08-14 – 2023-08-17 (×6): 1000 mg via ORAL
  Filled 2023-08-14 (×6): qty 2

## 2023-08-14 MED ORDER — ONDANSETRON HCL 4 MG/2ML IJ SOLN
INTRAMUSCULAR | Status: DC | PRN
  Administered 2023-08-14: 4 mg via INTRAVENOUS

## 2023-08-14 MED ORDER — CHLORHEXIDINE GLUCONATE 0.12 % MT SOLN
15.0000 mL | Freq: Once | OROMUCOSAL | Status: AC
Start: 2023-08-14 — End: 2023-08-14

## 2023-08-14 MED ORDER — PROPOFOL 10 MG/ML IV BOLUS
INTRAVENOUS | Status: AC
  Filled 2023-08-14: qty 20

## 2023-08-14 MED ORDER — HYDROMORPHONE HCL 1 MG/ML IJ SOLN
0.5000 mg | INTRAMUSCULAR | Status: AC | PRN
  Administered 2023-08-14 (×2): 0.5 mg via INTRAVENOUS
  Filled 2023-08-14 (×2): qty 0.5

## 2023-08-14 MED ORDER — PHENYLEPHRINE 80 MCG/ML (10ML) SYRINGE FOR IV PUSH (FOR BLOOD PRESSURE SUPPORT)
PREFILLED_SYRINGE | INTRAVENOUS | Status: DC | PRN
  Administered 2023-08-14 (×3): 160 ug via INTRAVENOUS
  Administered 2023-08-14: 80 ug via INTRAVENOUS

## 2023-08-14 MED ORDER — LACTATED RINGERS IV SOLN: INTRAVENOUS | Status: DC | PRN

## 2023-08-14 MED ORDER — BUPIVACAINE HCL 0.5 % IJ SOLN
INTRAMUSCULAR | Status: AC
  Filled 2023-08-14: qty 1

## 2023-08-14 MED ORDER — PROPOFOL 10 MG/ML IV BOLUS
INTRAVENOUS | Status: DC | PRN
  Administered 2023-08-14: 200 mg via INTRAVENOUS

## 2023-08-14 MED ORDER — LINEZOLID 600 MG PO TABS
600.0000 mg | ORAL_TABLET | Freq: Two times a day (BID) | ORAL | Status: DC
  Administered 2023-08-14 – 2023-08-15 (×2): 600 mg via ORAL
  Filled 2023-08-14 (×4): qty 1

## 2023-08-14 MED ORDER — DEXTROSE 50 % IV SOLN: 1.0000 | INTRAVENOUS | Status: DC | PRN

## 2023-08-14 MED ORDER — FENTANYL CITRATE (PF) 100 MCG/2ML IJ SOLN
25.0000 ug | INTRAMUSCULAR | Status: DC | PRN
Start: 2023-08-14 — End: 2023-08-14

## 2023-08-14 SURGICAL SUPPLY — 50 items
BENZOIN TINCTURE PRP APPL 2/3 (GAUZE/BANDAGES/DRESSINGS) IMPLANT
BLADE SURG 15 STRL LF DISP TIS (BLADE) ×1 IMPLANT
BNDG ELASTIC 3INX 5YD STR LF (GAUZE/BANDAGES/DRESSINGS) ×1 IMPLANT
BNDG ELASTIC 4INX 5YD STR LF (GAUZE/BANDAGES/DRESSINGS) ×1 IMPLANT
BNDG ELASTIC 6X10 VLCR STRL LF (GAUZE/BANDAGES/DRESSINGS) IMPLANT
BNDG ESMARK 4X9 LF (GAUZE/BANDAGES/DRESSINGS) ×1 IMPLANT
BNDG GAUZE DERMACEA FLUFF 4 (GAUZE/BANDAGES/DRESSINGS) ×1 IMPLANT
CHLORAPREP W/TINT 26 (MISCELLANEOUS) ×1 IMPLANT
CNTNR URN SCR LID CUP LEK RST (MISCELLANEOUS) ×1 IMPLANT
COVER BACK TABLE 60X90IN (DRAPES) ×1 IMPLANT
CUFF TOURN SGL QUICK 18X4 (TOURNIQUET CUFF) ×1 IMPLANT
CUFF TRNQT CYL 24X4X16.5-23 (TOURNIQUET CUFF) ×1 IMPLANT
DRAPE 3/4 80X56 (DRAPES) ×1 IMPLANT
DRAPE DERMATAC (DRAPES) IMPLANT
DRAPE EXTREMITY T 121X128X90 (DISPOSABLE) ×1 IMPLANT
DRAPE SHEET LG 3/4 BI-LAMINATE (DRAPES) ×1 IMPLANT
DRAPE U-SHAPE 47X51 STRL (DRAPES) ×1 IMPLANT
DRSG VERAFLOW VAC LRG (GAUZE/BANDAGES/DRESSINGS) IMPLANT
ELECT REM PT RETURN 15FT ADLT (MISCELLANEOUS) ×1 IMPLANT
GAUZE SPONGE 4X4 12PLY STRL (GAUZE/BANDAGES/DRESSINGS) ×1 IMPLANT
GAUZE XEROFORM 1X8 LF (GAUZE/BANDAGES/DRESSINGS) ×1 IMPLANT
GLOVE BIO SURGEON STRL SZ7.5 (GLOVE) ×1 IMPLANT
GLOVE BIOGEL PI IND STRL 7.5 (GLOVE) ×1 IMPLANT
GOWN STRL REUS W/ TWL XL LVL3 (GOWN DISPOSABLE) ×1 IMPLANT
KIT BASIN OR (CUSTOM PROCEDURE TRAY) ×1 IMPLANT
KIT TURNOVER KIT B (KITS) IMPLANT
MANIFOLD NEPTUNE II (INSTRUMENTS) ×1 IMPLANT
MATRIX WOUND MESHED 2X2 (Tissue) IMPLANT
NDL HYPO 25X1 1.5 SAFETY (NEEDLE) ×1 IMPLANT
NEEDLE HYPO 25X1 1.5 SAFETY (NEEDLE) ×1 IMPLANT
NS IRRIG 1000ML POUR BTL (IV SOLUTION) ×1 IMPLANT
PADDING CAST ABS COTTON 4X4 ST (CAST SUPPLIES) ×1 IMPLANT
PENCIL SMOKE EVACUATOR (MISCELLANEOUS) IMPLANT
SET HNDPC FAN SPRY TIP SCT (DISPOSABLE) IMPLANT
SET IRRIG Y TYPE TUR BLADDER L (SET/KITS/TRAYS/PACK) ×1 IMPLANT
SPONGE T-LAP 4X18 ~~LOC~~+RFID (SPONGE) ×1 IMPLANT
STAPLER SKIN PROX WIDE 3.9 (STAPLE) ×1 IMPLANT
STOCKINETTE 6 STRL (DRAPES) ×1 IMPLANT
SUCTION TUBE FRAZIER 10FR DISP (SUCTIONS) ×1 IMPLANT
SUT ETHILON 3 0 PS 1 (SUTURE) ×1 IMPLANT
SUT ETHILON 4 0 PS 2 18 (SUTURE) ×1 IMPLANT
SUT MNCRL AB 3-0 PS2 18 (SUTURE) ×1 IMPLANT
SUT MNCRL AB 4-0 PS2 18 (SUTURE) ×1 IMPLANT
SUT VIC AB 2-0 SH 27XBRD (SUTURE) ×1 IMPLANT
SYR BULB EAR ULCER 3OZ GRN STR (SYRINGE) ×1 IMPLANT
SYR CONTROL 10ML LL (SYRINGE) ×1 IMPLANT
TUBE CONNECTING 12X1/4 (SUCTIONS) ×1 IMPLANT
TUBE IRRIGATION SET MISONIX (TUBING) ×1 IMPLANT
UNDERPAD 30X36 HEAVY ABSORB (UNDERPADS AND DIAPERS) ×1 IMPLANT
YANKAUER SUCT BULB TIP NO VENT (SUCTIONS) ×1 IMPLANT

## 2023-08-14 NOTE — Progress Notes (Signed)
 Due to his AKI, ok to transition vanc to linezolid  600mg  PO BID for now per Dr. Samtani.  Ivery Marking, PharmD, BCIDP, AAHIVP, CPP Infectious Disease Pharmacist 08/14/2023 1:49 PM

## 2023-08-14 NOTE — Progress Notes (Signed)
 [facility] ROUNDING NOTE Wade Asebedo ZOX:[id number]  DOB: [birth year]  DOA: 08/10/2023  PCP: Jobe Mulder, DO  08/14/2023,12:53 PM  LOS: 4 days    Code Status: Full code   from: Home current Dispo: Possible skilled   58 year old manage BMI 47 DM 2 x 2 HLD gout OSA on CPAP bipolar gout Chronic chest pain with negative cardiac cath 2019--previous admission worsening diastolic dysfunction-osteoarthritis left hip with progressively worsening pain and dysfunction status post total hip arthroplasty left side 01/2020  4/21 present med [facility] after being seen 4/8 PCP with cellulitis of foot and ulcer lateral aspect of fifth toe at metatarsal phalangeal joint was prescribed 10-day course doxycycline -significantly worsened new swelling erythema foul discharge and came to be evaluated--this occurred on dorsal aspect of foot Sodium 136 potassium 4.3 BUN/creatinine 34/1.37 lactic acid 2.0 CRP 16--WBC 10.5 hemoglobin 11.3 platelet 252 Foot x-ray left side soft tissue swelling with 5 metatarsal phalangeal joint compatible with infection moderate soft tissue swelling.  Toes worsened compared to prior focal lucencies lateral aspect head of fifth metatarsal indeterminate-osteomyelitis not excluded MRI foot osteomyelitis fifth metatarsal head distal half of fifth metatarsal shaft with probable adjacent osteo within fifth proximal phalanx extensive inflammatory changes with ill-defined peripherally enhancing fluid collections wrapping around plantar lateral aspect fifth metatarsal head.  Degenerative changes as first metatarsophalangeal joint throughout midfoot Started on Zosyn  and vancomycin  in the ED 4/22 Dr. Rosemarie Conquest consulted --s/p initial debridement /20 5 repeat debridement of  Plan  Left foot wound dorsal and plantar aspect of fifth metatarsal with inflammatory soft tissue infection--failed outpatient management with progression of disease--4/22 debridement partial left foot fifth ray amputation  with antibiotic dissolvable beads and dermal allograft 38 cm--- to be nonweightbearing to left foot Pain not totally controlled 7/10, scheduled Tylenol  now 1000 every 6 as needed, Oxy IR remains 10 Q3 as needed increase Robaxin  1000 twice daily and see how the pain feels over next 24 hours Continues vancomycin  1.75 g every 24, Zosyn  3.375 IV every 8-Wound culture 4/23 1 bottle growing rare staph stimulants so await final culture before de-escalation----May need prolonged antibiotics and will see once we get cultures back Cut back NS 50 cc/H  AKI PTA on Lasix  40 twice daily, lisinopril  20--now held-ns as above  DM TY 2 Metformin  1 g twice daily held-gabapentin  300 3 times daily resumed PCP was adjusting Mounjaro as an outpatient-defer to outpatient CBGs 130-160 on sliding scale-no long-acting at this time  HTN Diastolic heart failure Coreg  6.25 twice daily resumed--Lasix  held for now resume either outpatient or closer to discharge  [place] for OHSS Order overnight CPAP  Anxiety-May use hydroxyzine  25 3 times daily as needed for anxiety  Restless legs Continue Requip  4 in the morning-schedule the p.m. dose to 6 PM, continue Mirapex  0.125 at bedtime  DVT prophylaxis: Lovenox  subcu  Status is: Inpatient Remains inpatient appropriate because:   Requires definitive management   Subjective:  Pain still moderate 7/10 He just came back from surgery he is eating and drinking has no pain no fever no nausea   Objective + exam Vitals:   08/14/23 0936 08/14/23 0945 08/14/23 1006 08/14/23 1024  BP: 127/80 136/81 (!) 132/58 (!) 141/80  Pulse: 89 88 86 80  Resp: 13 15 18 18   Temp: 98 F (36.7 C)  98 F (36.7 C) 98.1 F (36.7 C)  TempSrc:    Oral  SpO2: (!) 89% 92% 95% 95%  Weight:      Height:  Filed Weights   08/10/23 1756 08/14/23 0808 08/14/23 0819  Weight: (!) 147 kg (!) (P) 147 kg (!) 147 kg    Examination:   Coherent morbidly obese no distress Mallampati  4 S1-S2 no murmur Chest is clear ROM is intact Neck soft supple Left lower extremity is bandaged  Data Reviewed: reviewed   CBC    Component Value Date/Time   WBC 12.3 (H) 08/14/2023 0452   RBC 3.62 (L) 08/14/2023 0452   HGB 9.5 (L) 08/14/2023 0452   HGB 12.4 (L) 07/14/2019 1247   HCT 30.4 (L) 08/14/2023 0452   HCT 37.5 07/14/2019 1247   PLT 260 08/14/2023 0452   PLT 281 07/14/2019 1247   MCV 84.0 08/14/2023 0452   MCV 82 07/14/2019 1247   MCH 26.2 08/14/2023 0452   MCHC 31.3 08/14/2023 0452   RDW 14.8 08/14/2023 0452   RDW 14.6 07/14/2019 1247   LYMPHSABS 2.1 08/14/2023 0452   LYMPHSABS 3.3 (H) 07/14/2019 1247   MONOABS 1.2 (H) 08/14/2023 0452   EOSABS 0.7 (H) 08/14/2023 0452   EOSABS 0.5 (H) 07/14/2019 1247   BASOSABS 0.1 08/14/2023 0452   BASOSABS 0.1 07/14/2019 1247      Latest Ref Rng & Units 08/14/2023    4:52 AM 08/13/2023    6:25 AM 08/12/2023    7:18 AM  CMP  Glucose 70 - 99 mg/dL 161  096  045   BUN 6 - 20 mg/dL 26  28  24    Creatinine 0.61 - 1.24 mg/dL 4.09  8.11  9.14   Sodium 135 - 145 mmol/L 138  136  138   Potassium 3.5 - 5.1 mmol/L 4.1  3.9  4.2   Chloride 98 - 111 mmol/L 100  97  96   CO2 22 - 32 mmol/L 27  27  30    Calcium  8.9 - 10.3 mg/dL 8.6  8.3  8.5   Total Protein 6.5 - 8.1 g/dL   7.5   Total Bilirubin 0.0 - 1.2 mg/dL   0.7   Alkaline Phos 38 - 126 U/L   52   AST 15 - 41 U/L   13   ALT 0 - 44 U/L   21     Scheduled Meds:  allopurinol   300 mg Oral BID   amLODipine   5 mg Oral Daily   atorvastatin   80 mg Oral Daily   carvedilol   6.25 mg Oral BID WC   gabapentin   300 mg Oral TID   insulin  aspart  0-15 Units Subcutaneous TID WC   insulin  aspart  0-5 Units Subcutaneous QHS   methocarbamol   1,000 mg Oral BID   mupirocin  ointment  1 Application Nasal BID   pramipexole   0.125 mg Oral QHS   rOPINIRole   4 mg Oral q AM   rOPINIRole   8 mg Oral q1800   senna-docusate  2 tablet Oral BID   sodium chloride  flush  3 mL Intravenous Q12H    Continuous Infusions:  sodium chloride  Stopped (08/14/23 1149)   piperacillin -tazobactam (ZOSYN )  IV 3.375 g (08/14/23 0546)   vancomycin  Stopped (08/13/23 1525)    Time  25  Verlie Glisson, MD  [facility]

## 2023-08-14 NOTE — Anesthesia Procedure Notes (Signed)
 Procedure Name: LMA Insertion Date/Time: 08/14/2023 9:02 AM  Performed by: Viki Graver, CRNAPre-anesthesia Checklist: Patient identified, Emergency Drugs available, Suction available and Patient being monitored Patient Re-evaluated:Patient Re-evaluated prior to induction Oxygen Delivery Method: Circle System Utilized Preoxygenation: Pre-oxygenation with 100% oxygen Induction Type: IV induction Ventilation: Mask ventilation without difficulty LMA: LMA inserted LMA Size: 5.0 Number of attempts: 1 Airway Equipment and Method: Bite block Placement Confirmation: positive ETCO2 Tube secured with: Tape Dental Injury: Teeth and Oropharynx as per pre-operative assessment

## 2023-08-14 NOTE — Transfer of Care (Signed)
 Immediate Anesthesia Transfer of Care Note  Patient: Justin Hall  Procedure(s) Performed: IRRIGATION AND DEBRIDEMENT FOOT (Left) APPLICATION, WOUND VAC (Left) APPLICATION, ALLOGRAFT, SKIN (Left)  Patient Location: PACU  Anesthesia Type:General  Level of Consciousness: awake, alert , and oriented  Airway & Oxygen Therapy: Patient Spontanous Breathing and Patient connected to face mask oxygen  Post-op Assessment: Report given to RN and Post -op Vital signs reviewed and stable  Post vital signs: Reviewed and stable  Last Vitals:  Vitals Value Taken Time  BP 127/80 08/14/23 0937  Temp    Pulse 88 08/14/23 0938  Resp 18 08/14/23 0938  SpO2 86 % 08/14/23 0938  Vitals shown include unfiled device data.  Last Pain:  Vitals:   08/14/23 0819  TempSrc: Oral  PainSc: 7       Patients Stated Pain Goal: 0 (08/12/23 2200)  Complications: No notable events documented.

## 2023-08-14 NOTE — Op Note (Signed)
 Full Operative Report  Date of Operation: 9:37 AM, 08/14/2023   Patient: Justin Hall - 58 y.o. male  Surgeon: Evertt Hoe, DPM   Assistant: None  Diagnosis: Osteomyelitis of left foot  Procedure:  1.  Repeat irrigation and debridement of surgical site with prep for graft, 5.5 x 5 cm, left foot 2.  Application dermal allograft 5 x 5 cm, left foot 3.  Application negative pressure wound therapy 5.5 x 5 cm, left foot    Anesthesia: General  Grace Laura, MD  Anesthesiologist: Grace Laura, MD CRNA: Viki Graver, CRNA   Estimated Blood Loss: Minimal   Hemostasis: 1) Anatomical dissection, mechanical compression, electrocautery 2) No tourniquet was used  Implants: Implant Name Type Inv. Item Serial No. Manufacturer Lot No. LRB No. Used Action  MATRIX WOUND MESHED 2X2 - [id number] Tissue MATRIX WOUND MESHED 2X2  [employer] [id number] Left 1 Implanted    Materials: skin staples  Injectables: 1) Pre-operatively: None 2) Post-operatively: 10 cc of  0.5% marcaine  plain  Specimens: Pathology: None microbiology: None   Antibiotics: IV antibiotics given per schedule on the floor  Drains: None  Complications: Patient tolerated the procedure well without complication.   Operative findings: As below in detailed report  Indications for Procedure: Linkoln Alkire presents to Evertt Hoe, DPM with a chief complaint of open surgical site status post prior partial fifth ray amputation.  Presenting for planned staged debridement and graft application.  The patient has failed conservative treatments of various modalities. At this time the patient has elected to proceed with surgical correction. All alternatives, risks, and complications of the procedures were thoroughly explained to the patient. Patient exhibits appropriate understanding of all discussion points and informed consent was signed and obtained in the chart with no guarantees to  surgical outcome given or implied.  Description of Procedure: Patient was brought to the operating room. Patient remained on their hospital bed in the supine position. A surgical timeout was performed and all members of the operating room, the procedure, and the surgical site were identified. anesthesia occurred as per anesthesia record. Local anesthetic as previously described was then injected about the operative field in a local infiltrative block.  The operative lower extremity as noted above was then prepped and draped in the usual sterile manner. The following procedure then began.  Attention was directed the surgical site at the distal aspect of the left lateral forefoot.  The surgical wound measured 5.5 x 5 cm.  Excisional debridement with prep for graft was then carried out.  The graft was debrided thoroughly with rongeur and curette to the level of healthy bleeding granular tissue.  Any necrotic or fibrotic tissues present in wound bed were thoroughly debrided excisionally.  There is no bone exposed at the conclusion of debridement.  Next the surgical site was irrigated thoroughly with 3 L of sterile saline via power pulse lavage.  Electrocautery was used to achieve hemostasis.  Next a 5 x 5 cm piece of Integra meshed bilayer graft was applied to the surgical site.  This was affixed to the surgical bed with staples and was well adhered to the wound base.  No graft was wasted covered nearly the entirety of the wound.  Next wound VAC device was applied.  Black foam sponge was cut to the size of the postdebridement wound measurement including 5.5 x 5 cm.  The black foam sponge was placed over the Integra graft.  Dermatex wound VAC drape was then placed  over the black foam sponge which was secured well with the foot.  The VAC was attached and set 125 mm mercury continuous suction with no leaks detected.  The surgical site was then dressed with 4 x 4 gauze Kerlix Ace roll. The patient tolerated both  the procedure and anesthesia well with vital signs stable throughout. The patient was transferred in good condition and all vital signs stable  from the OR to recovery under the discretion of anesthesia.  Condition: Vital signs stable, neurovascular status unchanged from preoperative   Surgical plan:  Healthy and clean surgical wound bed status postdebridement and graft application with VAC applied for healing by secondary intention.  Patient will require home VAC therapy 2X weekly wound VAC changes.  Follow culture and pathology to determine if prolonged antibiotic therapy is required.  If margin is positive for infection would recommend prolonged antibiotic therapy.  Nonweightbearing to the left foot.  The patient will be nonweightbearing in a postop shoe to the operative limb until further instructed. The dressing is to remain clean, dry, and intact. Will continue to follow unless noted elsewhere.   Russ Course, DPM [facility]

## 2023-08-14 NOTE — Progress Notes (Signed)
 History and Physical Interval Note:  08/14/2023 8:20 AM  Justin Hall  has presented today for surgery, with the diagnosis of open surgical wound s/p partial 5th ray amputation for osteomyelitis.  The various methods of treatment have been discussed with the patient and family. After consideration of risks, benefits and other options for treatment, the patient has consented to   Procedure(s) with comments: IRRIGATION AND DEBRIDEMENT FOOT (Left) - WITH GRAFT AND POSSIBLE WOUND VAC APPLICATION, WOUND VAC (Left) APPLICATION, ALLOGRAFT, SKIN (Left) as a surgical intervention.  The patient's history has been reviewed, patient examined, no change in status, stable for surgery.  I have reviewed the patient's chart and labs.  Questions were answered to the patient's satisfaction.     Karlene Overcast Nicki Furlan

## 2023-08-14 NOTE — Progress Notes (Signed)
 Made multiple attempts to get report from floor nurse for patient's surgery. No answer. Transport sent for patient.

## 2023-08-14 NOTE — Plan of Care (Signed)

## 2023-08-14 NOTE — Anesthesia Preprocedure Evaluation (Addendum)
 Anesthesia Evaluation  Patient identified by MRN, date of birth, ID band Patient awake    Reviewed: Allergy & Precautions, NPO status , Patient's Chart, lab work & pertinent test results, reviewed documented beta blocker date and time   Airway Mallampati: III  TM Distance: >3 FB Neck ROM: Full    Dental  (+) Chipped, Dental Advisory Given,    Pulmonary sleep apnea    Pulmonary exam normal breath sounds clear to auscultation       Cardiovascular hypertension, Pt. on home beta blockers and Pt. on medications + CAD and +CHF  Normal cardiovascular exam Rhythm:Regular Rate:Normal  TTE 2020 1. The left ventricle has low normal systolic function, with an ejection  fraction of 50-55%. The cavity size was normal. There is severely  increased left ventricular wall thickness. Left ventricular diastolic  Doppler parameters are consistent with  pseudonormalization. Left ventricular diffuse hypokinesis.   2. The right ventricle has normal systolic function. The cavity was  normal. There is no increase in right ventricular wall thickness.   3. There is moderate mitral annular calcification present.   4. The inferior vena cava was dilated in size with <50% respiratory  variability.      Neuro/Psych  PSYCHIATRIC DISORDERS Anxiety Depression    negative neurological ROS     GI/Hepatic negative GI ROS, Neg liver ROS,,,  Endo/Other  diabetes, Type 2, Oral Hypoglycemic Agents  Class 3 obesity (BMI 48)  Renal/GU Renal diseaseLab Results      Component                Value               Date                      NA                       138                 08/14/2023                CL                       100                 08/14/2023                K                        4.1                 08/14/2023                CO2                      27                  08/14/2023                BUN                      26 (H)               08/14/2023                CREATININE  1.46 (H)            08/14/2023                GFRNONAA                 55 (L)              08/14/2023                CALCIUM                   8.6 (L)             08/14/2023                PHOS                     3.8                 02/26/2020                ALBUMIN                  2.8 (L)             08/12/2023                GLUCOSE                  138 (H)             08/14/2023             negative genitourinary   Musculoskeletal  (+) Arthritis ,    Abdominal   Peds  Hematology  (+) Blood dyscrasia, anemia Lab Results      Component                Value               Date                      WBC                      12.3 (H)            08/14/2023                HGB                      9.5 (L)             08/14/2023                HCT                      30.4 (L)            08/14/2023                MCV                      84.0                08/14/2023                PLT                      260  08/14/2023              Anesthesia Other Findings   Reproductive/Obstetrics                             Anesthesia Physical Anesthesia Plan  ASA: 3  Anesthesia Plan: General   Post-op Pain Management: Tylenol  PO (pre-op)*   Induction: Intravenous  PONV Risk Score and Plan: 2 and Propofol  infusion, Treatment may vary due to age or medical condition, Ondansetron  and Midazolam   Airway Management Planned: LMA  Additional Equipment:   Intra-op Plan:   Post-operative Plan: Extubation in OR  Informed Consent: I have reviewed the patients History and Physical, chart, labs and discussed the procedure including the risks, benefits and alternatives for the proposed anesthesia with the patient or authorized representative who has indicated his/her understanding and acceptance.     Dental advisory given  Plan Discussed with: CRNA  Anesthesia Plan Comments:        Anesthesia  Quick Evaluation

## 2023-08-15 DIAGNOSIS — E11621 Type 2 diabetes mellitus with foot ulcer: Secondary | ICD-10-CM | POA: Diagnosis not present

## 2023-08-15 DIAGNOSIS — L97529 Non-pressure chronic ulcer of other part of left foot with unspecified severity: Secondary | ICD-10-CM | POA: Diagnosis not present

## 2023-08-15 LAB — CBC WITH DIFFERENTIAL/PLATELET
Abs Immature Granulocytes: 0.07 10*3/uL (ref 0.00–0.07)
Basophils Absolute: 0.1 10*3/uL (ref 0.0–0.1)
Basophils Relative: 0 %
Eosinophils Absolute: 0.7 10*3/uL — ABNORMAL HIGH (ref 0.0–0.5)
Eosinophils Relative: 6 %
HCT: 28.6 % — ABNORMAL LOW (ref 39.0–52.0)
Hemoglobin: 9 g/dL — ABNORMAL LOW (ref 13.0–17.0)
Immature Granulocytes: 1 %
Lymphocytes Relative: 22 %
Lymphs Abs: 2.6 10*3/uL (ref 0.7–4.0)
MCH: 26.5 pg (ref 26.0–34.0)
MCHC: 31.5 g/dL (ref 30.0–36.0)
MCV: 84.1 fL (ref 80.0–100.0)
Monocytes Absolute: 1 10*3/uL (ref 0.1–1.0)
Monocytes Relative: 8 %
Neutro Abs: 7.3 10*3/uL (ref 1.7–7.7)
Neutrophils Relative %: 63 %
Platelets: 270 10*3/uL (ref 150–400)
RBC: 3.4 MIL/uL — ABNORMAL LOW (ref 4.22–5.81)
RDW: 15.1 % (ref 11.5–15.5)
WBC: 11.6 10*3/uL — ABNORMAL HIGH (ref 4.0–10.5)
nRBC: 0 % (ref 0.0–0.2)

## 2023-08-15 LAB — BASIC METABOLIC PANEL WITH GFR
Anion gap: 15 (ref 5–15)
BUN: 28 mg/dL — ABNORMAL HIGH (ref 6–20)
CO2: 28 mmol/L (ref 22–32)
Calcium: 8.9 mg/dL (ref 8.9–10.3)
Chloride: 98 mmol/L (ref 98–111)
Creatinine, Ser: 1.64 mg/dL — ABNORMAL HIGH (ref 0.61–1.24)
GFR, Estimated: 48 mL/min — ABNORMAL LOW (ref >60)
Glucose, Bld: 139 mg/dL — ABNORMAL HIGH (ref 70–99)
Potassium: 4.1 mmol/L (ref 3.5–5.1)
Sodium: 141 mmol/L (ref 135–145)

## 2023-08-15 LAB — CULTURE, BLOOD (ROUTINE X 2)
Culture: NO GROWTH
Culture: NO GROWTH
Special Requests: ADEQUATE
Special Requests: ADEQUATE

## 2023-08-15 LAB — GLUCOSE, CAPILLARY
Glucose-Capillary: 133 mg/dL — ABNORMAL HIGH (ref 70–99)
Glucose-Capillary: 188 mg/dL — ABNORMAL HIGH (ref 70–99)
Glucose-Capillary: 150 mg/dL — ABNORMAL HIGH (ref 70–99)
Glucose-Capillary: 145 mg/dL — ABNORMAL HIGH (ref 70–99)

## 2023-08-15 MED ORDER — GLIMEPIRIDE 1 MG PO TABS
1.0000 mg | ORAL_TABLET | Freq: Every day | ORAL | Status: DC
  Administered 2023-08-16 – 2023-08-17 (×2): 1 mg via ORAL
  Filled 2023-08-15 (×3): qty 1

## 2023-08-15 MED ORDER — ZOLPIDEM TARTRATE 5 MG PO TABS
5.0000 mg | ORAL_TABLET | Freq: Every evening | ORAL | Status: DC | PRN
  Administered 2023-08-15 – 2023-08-16 (×2): 5 mg via ORAL
  Filled 2023-08-15 (×2): qty 1

## 2023-08-15 MED ORDER — CIPROFLOXACIN HCL 500 MG PO TABS
500.0000 mg | ORAL_TABLET | Freq: Two times a day (BID) | ORAL | Status: DC
  Administered 2023-08-15 – 2023-08-16 (×2): 500 mg via ORAL
  Filled 2023-08-15 (×2): qty 1

## 2023-08-15 NOTE — Progress Notes (Signed)
 [facility] ROUNDING NOTE Bob Lagrave ZOX:[id number]  DOB: 1966/02/25  DOA: 08/10/2023  PCP: Jobe Mulder, DO  08/15/2023,9:05 AM  LOS: 5 days    Code Status: Full code   from: Home current Dispo: Possible skilled   58 year old manage BMI 47 DM 2 x 2 HLD gout OSA on CPAP bipolar gout Chronic chest pain with negative cardiac cath 2019--previous admission worsening diastolic dysfunction-osteoarthritis left hip with progressively worsening pain and dysfunction status post total hip arthroplasty left side 01/2020  4/21 present med [facility] after being seen 4/8 PCP with cellulitis of foot and ulcer lateral aspect of fifth toe at metatarsal phalangeal joint was prescribed 10-day course doxycycline -significantly worsened new swelling erythema foul discharge and came to be evaluated--this occurred on dorsal aspect of foot Sodium 136 potassium 4.3 BUN/creatinine 34/1.37 lactic acid 2.0 CRP 16--WBC 10.5 hemoglobin 11.3 platelet 252 Foot x-ray left side soft tissue swelling with 5 metatarsal phalangeal joint compatible with infection moderate soft tissue swelling.  Toes worsened compared to prior focal lucencies lateral aspect head of fifth metatarsal indeterminate-osteomyelitis not excluded MRI foot osteomyelitis fifth metatarsal head distal half of fifth metatarsal shaft with probable adjacent osteo within fifth proximal phalanx extensive inflammatory changes with ill-defined peripherally enhancing fluid collections wrapping around plantar lateral aspect fifth metatarsal head.  Degenerative changes as first metatarsophalangeal joint throughout midfoot Started on Zosyn  and vancomycin  in the ED 4/22 Dr. Rosemarie Conquest consulted --s/p initial debridement-- partial left foot fifth ray amputation with antibiotic dissolvable beads and dermal allograft 38 cm--- to be nonweightbearing to left foot 4/25  repeat debridement of  Plan  Left foot wound dorsal and plantar aspect of fifth metatarsal with  inflammatory soft tissue infection--failed outpatient management with progression of disease-- 4/22 debridement--4/25 subsequent debridement macroscopic clean margins--growing Staph simulans await speciation prior to narrowing-antibiotics adjusted vancomycin  to iv linezolid  600 every 12, Zosyn  3.3 every 8 Pain 7/10, scheduled Tylenol  now 1000 every 6 as needed, Oxy IR remains 10 Q3 as needed increase Robaxin  1000 twice daily and see how the pain feels over next 24 hours  AKI PTA on Lasix  40 twice daily, lisinopril  20--now held-ns as above  DM TY 2 Metformin  1 g twice daily held-gabapentin  300 3 times daily resumed PCP was adjusting Mounjaro as an outpatient-defer to outpatient CBGs 130-160 with SSI no need long-acting  HTN Diastolic heart failure Coreg  6.25 twice daily resumed--Lasix  held for now resume either outpatient or closer to discharge  Habitus for [facility] Continuing overnight CPAP  Anxiety-hydroxyzine  25 3 times daily as needed for anxiety Addins low dose Ambien  5 mg for sleep Watch mentation  Restless legs Continue Requip  4 in the morning-schedule the p.m. dose to 6 PM,--- Mirapex  discontinued  DVT prophylaxis: Lovenox  subcu  Status is: Inpatient Remains inpatient appropriate because:   Requires definitive management   Subjective:  Pain better controlled not needing IV pain meds no chest pain--he seems a bit sleepy on the pain meds but is able to answer all my questions Answered all questions about home meds that we have held for now and explained we may need to use alternative to metformin  for blood sugar   Objective + exam Vitals:   08/14/23 1818 08/14/23 2041 08/15/23 0446 08/15/23 0733  BP: (!) 164/88 (!) 169/86 (!) 157/97 (!) 164/90  Pulse: 92 85 98 80  Resp:  18 18 17   Temp:  98.9 F (37.2 C) 98.9 F (37.2 C) 98.3 F (36.8 C)  TempSrc:   Oral Oral  SpO2: 93%  100% 95% 96%  Weight:      Height:       Filed Weights   08/10/23 1756 08/14/23 0808  08/14/23 0819  Weight: (!) 147 kg (!) (P) 147 kg (!) 147 kg    Examination:   Coherent morbidly obese no distress Mallampati 4 S1-S2 no murmur Chest is clear no wheeze no rales no rhonchi ROM is intact no focal deficit left lower extremity is wrapped with reinforce dressing power is 5/5 overall  Data Reviewed: reviewed   CBC    Component Value Date/Time   WBC 11.6 (H) 08/15/2023 0654   RBC 3.40 (L) 08/15/2023 0654   HGB 9.0 (L) 08/15/2023 0654   HGB 12.4 (L) 07/14/2019 1247   HCT 28.6 (L) 08/15/2023 0654   HCT 37.5 07/14/2019 1247   PLT 270 08/15/2023 0654   PLT 281 07/14/2019 1247   MCV 84.1 08/15/2023 0654   MCV 82 07/14/2019 1247   MCH 26.5 08/15/2023 0654   MCHC 31.5 08/15/2023 0654   RDW 15.1 08/15/2023 0654   RDW 14.6 07/14/2019 1247   LYMPHSABS 2.6 08/15/2023 0654   LYMPHSABS 3.3 (H) 07/14/2019 1247   MONOABS 1.0 08/15/2023 0654   EOSABS 0.7 (H) 08/15/2023 0654   EOSABS 0.5 (H) 07/14/2019 1247   BASOSABS 0.1 08/15/2023 0654   BASOSABS 0.1 07/14/2019 1247      Latest Ref Rng & Units 08/15/2023    6:54 AM 08/14/2023    4:52 AM 08/13/2023    6:25 AM  CMP  Glucose 70 - 99 mg/dL 604  540  981   BUN 6 - 20 mg/dL 28  26  28    Creatinine 0.61 - 1.24 mg/dL 1.91  4.78  2.95   Sodium 135 - 145 mmol/L 141  138  136   Potassium 3.5 - 5.1 mmol/L 4.1  4.1  3.9   Chloride 98 - 111 mmol/L 98  100  97   CO2 22 - 32 mmol/L 28  27  27    Calcium  8.9 - 10.3 mg/dL 8.9  8.6  8.3     Scheduled Meds:  allopurinol   300 mg Oral BID   amLODipine   5 mg Oral Daily   atorvastatin   80 mg Oral Daily   carvedilol   6.25 mg Oral BID WC   dextrose   1 ampule Intravenous Once   gabapentin   300 mg Oral TID   [START ON 08/16/2023] glimepiride  1 mg Oral Q breakfast   insulin  aspart  0-15 Units Subcutaneous TID WC   insulin  aspart  0-5 Units Subcutaneous QHS   linezolid   600 mg Oral Q12H   methocarbamol   1,000 mg Oral BID   mupirocin  ointment  1 Application Nasal BID   rOPINIRole   4 mg  Oral q AM   rOPINIRole   8 mg Oral q1800   senna-docusate  2 tablet Oral BID   sodium chloride  flush  3 mL Intravenous Q12H   Continuous Infusions:  piperacillin -tazobactam (ZOSYN )  IV 12.5 mL/hr at 08/15/23 6213    Time  35  Verlie Glisson, MD  [facility]

## 2023-08-15 NOTE — Progress Notes (Signed)
   08/15/23 2018  BiPAP/CPAP/SIPAP  BiPAP/CPAP/SIPAP Pt Type Adult  BiPAP/CPAP/SIPAP Resmed  Mask Type Full face mask  Dentures removed? Not applicable  Mask Size Large  PEEP 5 cmH20  Patient Home Machine No  Patient Home Mask No  Patient Home Tubing No  Auto Titrate No  Device Plugged into RED Power Outlet Yes  BiPAP/CPAP /SiPAP Vitals  Pulse Rate 77  Resp 16  SpO2 93 %  Bilateral Breath Sounds Clear  MEWS Score/Color  MEWS Score 0  MEWS Score Color Marrie Sizer

## 2023-08-15 NOTE — Plan of Care (Cosign Needed)
  Problem: Clinical Measurements: Goal: Will remain free from infection Outcome: Progressing   Problem: Activity: Goal: Risk for activity intolerance will decrease Outcome: Progressing   Problem: Nutrition: Goal: Adequate nutrition will be maintained Outcome: Progressing   Problem: Pain Managment: Goal: General experience of comfort will improve and/or be controlled Outcome: Progressing   Problem: Safety: Goal: Ability to remain free from injury will improve Outcome: Progressing   Problem: Skin Integrity: Goal: Risk for impaired skin integrity will decrease Outcome: Progressing   Problem: Metabolic: Goal: Ability to maintain appropriate glucose levels will improve Outcome: Progressing
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
## 2023-08-16 ENCOUNTER — Other Ambulatory Visit: Payer: Self-pay

## 2023-08-16 ENCOUNTER — Inpatient Hospital Stay (HOSPITAL_COMMUNITY)

## 2023-08-16 DIAGNOSIS — L97529 Non-pressure chronic ulcer of other part of left foot with unspecified severity: Secondary | ICD-10-CM | POA: Diagnosis not present

## 2023-08-16 DIAGNOSIS — Z794 Long term (current) use of insulin: Secondary | ICD-10-CM | POA: Diagnosis not present

## 2023-08-16 DIAGNOSIS — Z8614 Personal history of Methicillin resistant Staphylococcus aureus infection: Secondary | ICD-10-CM

## 2023-08-16 DIAGNOSIS — M86172 Other acute osteomyelitis, left ankle and foot: Secondary | ICD-10-CM | POA: Diagnosis not present

## 2023-08-16 DIAGNOSIS — E11621 Type 2 diabetes mellitus with foot ulcer: Secondary | ICD-10-CM | POA: Diagnosis not present

## 2023-08-16 LAB — CBC WITH DIFFERENTIAL/PLATELET
Abs Immature Granulocytes: 0.09 10*3/uL — ABNORMAL HIGH (ref 0.00–0.07)
Basophils Absolute: 0.1 10*3/uL (ref 0.0–0.1)
Basophils Relative: 0 %
Eosinophils Absolute: 0.7 10*3/uL — ABNORMAL HIGH (ref 0.0–0.5)
Eosinophils Relative: 5 %
HCT: 30.7 % — ABNORMAL LOW (ref 39.0–52.0)
Hemoglobin: 9.7 g/dL — ABNORMAL LOW (ref 13.0–17.0)
Immature Granulocytes: 1 %
Lymphocytes Relative: 14 %
Lymphs Abs: 2 10*3/uL (ref 0.7–4.0)
MCH: 26.5 pg (ref 26.0–34.0)
MCHC: 31.6 g/dL (ref 30.0–36.0)
MCV: 83.9 fL (ref 80.0–100.0)
Monocytes Absolute: 1 10*3/uL (ref 0.1–1.0)
Monocytes Relative: 8 %
Neutro Abs: 9.8 10*3/uL — ABNORMAL HIGH (ref 1.7–7.7)
Neutrophils Relative %: 72 %
Platelets: 294 10*3/uL (ref 150–400)
RBC: 3.66 MIL/uL — ABNORMAL LOW (ref 4.22–5.81)
RDW: 15.2 % (ref 11.5–15.5)
WBC: 13.6 10*3/uL — ABNORMAL HIGH (ref 4.0–10.5)
nRBC: 0 % (ref 0.0–0.2)

## 2023-08-16 LAB — GLUCOSE, CAPILLARY
Glucose-Capillary: 144 mg/dL — ABNORMAL HIGH (ref 70–99)
Glucose-Capillary: 155 mg/dL — ABNORMAL HIGH (ref 70–99)
Glucose-Capillary: 89 mg/dL (ref 70–99)
Glucose-Capillary: 172 mg/dL — ABNORMAL HIGH (ref 70–99)

## 2023-08-16 LAB — BASIC METABOLIC PANEL WITH GFR
Anion gap: 9 (ref 5–15)
BUN: 25 mg/dL — ABNORMAL HIGH (ref 6–20)
CO2: 28 mmol/L (ref 22–32)
Calcium: 8.6 mg/dL — ABNORMAL LOW (ref 8.9–10.3)
Chloride: 101 mmol/L (ref 98–111)
Creatinine, Ser: 1.39 mg/dL — ABNORMAL HIGH (ref 0.61–1.24)
GFR, Estimated: 58 mL/min — ABNORMAL LOW (ref >60)
Glucose, Bld: 150 mg/dL — ABNORMAL HIGH (ref 70–99)
Potassium: 4.6 mmol/L (ref 3.5–5.1)
Sodium: 138 mmol/L (ref 135–145)

## 2023-08-16 LAB — D-DIMER, QUANTITATIVE: D-Dimer, Quant: 3.59 ug{FEU}/mL — ABNORMAL HIGH (ref 0.00–0.50)

## 2023-08-16 MED ORDER — ENOXAPARIN SODIUM 150 MG/ML IJ SOSY
150.0000 mg | PREFILLED_SYRINGE | Freq: Two times a day (BID) | INTRAMUSCULAR | Status: DC
  Administered 2023-08-16 – 2023-08-17 (×2): 150 mg via SUBCUTANEOUS
  Filled 2023-08-16 (×4): qty 1

## 2023-08-16 MED ORDER — SODIUM CHLORIDE 0.9 % IV SOLN
8.0000 mg/kg | Freq: Every day | INTRAVENOUS | Status: DC
  Administered 2023-08-16 – 2023-08-17 (×2): 800 mg via INTRAVENOUS
  Filled 2023-08-16 (×2): qty 16

## 2023-08-16 MED ORDER — FUROSEMIDE 20 MG PO TABS: 20.0000 mg | ORAL_TABLET | Freq: Once | ORAL | Status: DC

## 2023-08-16 MED ORDER — HEPARIN SODIUM (PORCINE) 5000 UNIT/ML IJ SOLN: 5000.0000 [IU] | Freq: Three times a day (TID) | INTRAMUSCULAR | Status: DC
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
## 2023-08-16 NOTE — Progress Notes (Signed)
       Overnight   NAME: Justin Hall MRN: [id number] DOB : [birth year]    Date of Service   08/16/2023   HPI/Events of Note    Notified by [facility] Physician for Order of Perfusion scan with pending d-Dimer result in reference to Attending follow up.  Result is :  Latest Reference Range & Units 08/16/23 17:51  D-Dimer, Quant 0.00 - 0.50 ug/mL-FEU 3.59 (H)  (H): Data is abnormally high  D-Dimer is elevated , order in for reference to following treatment plan: "If dimer elevated would proceed to V/Q scan and start treatment dosing of Lovenox  while waiting on results."    Interventions/ Plan   Perfusion scan ordered Continue all Attending orders as prior.      Denece Finger BSN MSNA MSN ACNPC-AG Acute Care Nurse Practitioner [facility]

## 2023-08-16 NOTE — Progress Notes (Signed)
   08/16/23 1949  BiPAP/CPAP/SIPAP  BiPAP/CPAP/SIPAP Pt Type Adult  BiPAP/CPAP/SIPAP Resmed  Mask Type Full face mask  Mask Size Large  Respiratory Rate 18 breaths/min  EPAP 5 cmH2O  FiO2 (%) 36 %  Flow Rate 4 lpm  Patient Home Machine No  Patient Home Mask No  Patient Home Tubing No  Auto Titrate No  BiPAP/CPAP /SiPAP Vitals  Resp 18  Bilateral Breath Sounds Clear;Diminished  MEWS Score/Color  MEWS Score 0  MEWS Score Color Green

## 2023-08-16 NOTE — Progress Notes (Signed)
 [facility] ROUNDING NOTE Justin Hall ZOX:[id number]  DOB: 08/19/1965  DOA: 08/10/2023  PCP: Jobe Mulder, DO  08/16/2023,5:34 PM  LOS: 6 days    Code Status: Full code   from: Home current Dispo: Possible skilled   58 year old manage BMI 47 DM 2 x 2 HLD gout OSA on CPAP bipolar gout Chronic chest pain with negative cardiac cath 2019--previous admission worsening diastolic dysfunction-osteoarthritis left hip with progressively worsening pain and dysfunction status post total hip arthroplasty left side 01/2020  4/21 present med [facility] after being seen 4/8 PCP with cellulitis of foot and ulcer lateral aspect of fifth toe at metatarsal phalangeal joint was prescribed 10-day course doxycycline -significantly worsened new swelling erythema foul discharge and came to be evaluated--this occurred on dorsal aspect of foot Sodium 136 potassium 4.3 BUN/creatinine 34/1.37 lactic acid 2.0 CRP 16--WBC 10.5 hemoglobin 11.3 platelet 252 Foot x-ray left side soft tissue swelling with 5 metatarsal phalangeal joint compatible with infection moderate soft tissue swelling.  Toes worsened compared to prior focal lucencies lateral aspect head of fifth metatarsal indeterminate-osteomyelitis not excluded MRI foot osteomyelitis fifth metatarsal head distal half of fifth metatarsal shaft with probable adjacent osteo within fifth proximal phalanx extensive inflammatory changes with ill-defined peripherally enhancing fluid collections wrapping around plantar lateral aspect fifth metatarsal head.  Degenerative changes as first metatarsophalangeal joint throughout midfoot Started on Zosyn  and vancomycin  in the ED 4/22 Dr. Rosemarie Conquest consulted --s/p initial debridement-- partial left foot fifth ray amputation with antibiotic dissolvable beads and dermal allograft 38 cm--- to be nonweightbearing to left foot 4/25  repeat debridement of  Plan  Left foot wound dorsal and plantar aspect of fifth metatarsal with  inflammatory soft tissue infection--failed outpatient management with progression of disease-- 4/22 debridement--4/25 subsequent debridement macroscopic clean margins--growing Staph simulans await speciation prior to narrowing-antibiotics adjusted vancomycin  to iv linezolid  600 every 12, Zosyn  3.3 every 8-nonweightbearing at discharge--Has postop shoe as well as wound VAC in place ID consulted 4/27 transitioned antibiotics to Cubicin -likely 4 weeks therapy ending 5/19--PICC line ordered Pain moderate --scheduled Tylenol  now 1000 every 6 as needed, Oxy IR remains 10 Q3 --Robaxin  1000 twice daily  AKI PTA on Lasix  40 twice daily, lisinopril  20--now held-ns as above  Mild SOB-no hypoxia ?  Volume secondary to repletion holding Lasix  DDx PE-start with D-dimer-would place on Lovenox /heparin  [apparently was placed on SCD postsurgery] If dimer elevated would proceed to V/Q scan and start treatment dosing of Lovenox  while waiting on results  DM TY 2 Metformin  1 g twice daily held-using Amaryl 1 mg daily breakfast for now-gabapentin  300 3 times daily resumed PCP was adjusting Mounjaro as an outpatient-defer to outpatient CBGs 98-1 60 with SSI no need long-acting  HTN Diastolic heart failure Coreg  6.25 twice daily resumed--Lasix  held for now   [place] for [facility] Continuing overnight CPAP Some shortness of breath today-see above discussion  Anxiety-hydroxyzine  25 3 times daily as needed for anxiety Addins low dose Ambien  5 mg for sleep Watch mentation  Restless legs Continue Requip  4 in the morning-schedule the p.m. dose to 6 PM,--- Mirapex  discontinued  DVT prophylaxis: Lovenox  subcu  Status is: Inpatient Remains inpatient appropriate because:   Requires definitive management   Subjective:  Some short of breath today-states that respiratory changed to set up  a little bit dejected that may require PICC line for long-term antibiotics but is willing to proceed with that  plan    Objective + exam Vitals:   08/16/23 0110 08/16/23 0504 08/16/23 0758 08/16/23  1434  BP:  (!) 178/95 (!) 174/96 (!) 163/87  Pulse:  79 83 79  Resp:  18 18 18   Temp:  97.9 F (36.6 C) 98.7 F (37.1 C) 98.3 F (36.8 C)  TempSrc:  Oral Oral Oral  SpO2: 94% 92% 97% 92%  Weight:      Height:       Filed Weights   08/10/23 1756 08/14/23 0808 08/14/23 0819  Weight: (!) 147 kg (!) (P) 147 kg (!) 147 kg    Examination:   Pleasant coherent no distress Mallampati 4 CTAB no rales wheeze rhonchi S1-S2 no murmur Data Reviewed: reviewed   CBC    Component Value Date/Time   WBC 13.6 (H) 08/16/2023 0544   RBC 3.66 (L) 08/16/2023 0544   HGB 9.7 (L) 08/16/2023 0544   HGB 12.4 (L) 07/14/2019 1247   HCT 30.7 (L) 08/16/2023 0544   HCT 37.5 07/14/2019 1247   PLT 294 08/16/2023 0544   PLT 281 07/14/2019 1247   MCV 83.9 08/16/2023 0544   MCV 82 07/14/2019 1247   MCH 26.5 08/16/2023 0544   MCHC 31.6 08/16/2023 0544   RDW 15.2 08/16/2023 0544   RDW 14.6 07/14/2019 1247   LYMPHSABS 2.0 08/16/2023 0544   LYMPHSABS 3.3 (H) 07/14/2019 1247   MONOABS 1.0 08/16/2023 0544   EOSABS 0.7 (H) 08/16/2023 0544   EOSABS 0.5 (H) 07/14/2019 1247   BASOSABS 0.1 08/16/2023 0544   BASOSABS 0.1 07/14/2019 1247      Latest Ref Rng & Units 08/16/2023    5:44 AM 08/15/2023    6:54 AM 08/14/2023    4:52 AM  CMP  Glucose 70 - 99 mg/dL 932  355  732   BUN 6 - 20 mg/dL 25  28  26    Creatinine 0.61 - 1.24 mg/dL 2.02  5.42  7.06   Sodium 135 - 145 mmol/L 138  141  138   Potassium 3.5 - 5.1 mmol/L 4.6  4.1  4.1   Chloride 98 - 111 mmol/L 101  98  100   CO2 22 - 32 mmol/L 28  28  27    Calcium  8.9 - 10.3 mg/dL 8.6  8.9  8.6     Scheduled Meds:  allopurinol   300 mg Oral BID   amLODipine   5 mg Oral Daily   atorvastatin   80 mg Oral Daily   carvedilol   6.25 mg Oral BID WC   dextrose   1 ampule Intravenous Once   gabapentin   300 mg Oral TID   glimepiride  1 mg Oral Q breakfast   heparin  injection  (subcutaneous)  5,000 Units Subcutaneous Q8H   insulin  aspart  0-15 Units Subcutaneous TID WC   insulin  aspart  0-5 Units Subcutaneous QHS   methocarbamol   1,000 mg Oral BID   mupirocin  ointment  1 Application Nasal BID   rOPINIRole   4 mg Oral q AM   rOPINIRole   8 mg Oral q1800   senna-docusate  2 tablet Oral BID   sodium chloride  flush  3 mL Intravenous Q12H   Continuous Infusions:  DAPTOmycin      Time  35  Jai-Gurmukh Auburn Hester, MD  [facility]

## 2023-08-16 NOTE — Progress Notes (Signed)
 Orthopedic Tech Progress Note Patient Details:  Justin Hall 05-24-1965 [id number]  Cam Boot applied to patients LLE. Wound vac drain routed to the top of the cam boot as instructed by RN.   Ortho Devices Type of Ortho Device: CAM walker Ortho Device/Splint Location: LLE Ortho Device/Splint Interventions: Ordered, Application, Adjustment   Post Interventions Patient Tolerated: Well Instructions Provided: Adjustment of device, Care of device  Nels Banda 08/16/2023, 3:27 PM
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
## 2023-08-16 NOTE — Progress Notes (Signed)
 Pharmacy Antibiotic Note  Justin Hall is a 58 y.o. male admitted on 08/10/2023 with  wound infection .  MRI reveals osteo and abscess.  Pharmacy has been consulted for daptomycin dosing per ID recommendations.  Plan: Daptomycin 8mg /kg IV q24h based on adjusted body weight 100kg Check ck in AM and weekly Monitor renal function closely and adjust dosing interval if needed Follow up cultures as available, clinical progress, length of tx  Height: 5\' 9"  (175.3 cm) Weight: (!) 147 kg (324 lb 1.2 oz) IBW/kg (Calculated) : 70.7  Temp (24hrs), Avg:98.4 F (36.9 C), Min:97.9 F (36.6 C), Max:98.7 F (37.1 C)  Recent Labs  Lab 08/10/23 1247 08/10/23 1427 08/11/23 0641 08/12/23 0718 08/13/23 0625 08/14/23 0452 08/15/23 0654 08/16/23 0544  WBC 10.5  --    < > 10.6* 12.0* 12.3* 11.6* 13.6*  CREATININE 1.37*  --    < > 1.61* 1.65* 1.46* 1.64* 1.39*  LATICACIDVEN 2.0* 1.2  --   --   --   --   --   --    < > = values in this interval not displayed.    Estimated Creatinine Clearance: 80.9 mL/min (A) (by C-G formula based on SCr of 1.39 mg/dL (H)).    No Known Allergies  Vanc 4/21 >> 4/25 Zosyn  4/21 >> 4/27 Zyvox  4/25 >> 4/27 Dapto 4/27 >>  4/21 BCx - NgF 4/23 left foot tissue: rare Staph simulans oxa sensitive 4/23 left foot bone: ngtd  Armanda Bern, PharmD, BCPS 08/16/2023, 4:07 PM
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
## 2023-08-17 ENCOUNTER — Encounter (HOSPITAL_COMMUNITY): Payer: Self-pay | Admitting: Podiatry

## 2023-08-17 ENCOUNTER — Inpatient Hospital Stay (HOSPITAL_COMMUNITY)

## 2023-08-17 ENCOUNTER — Inpatient Hospital Stay: Admitting: Family Medicine

## 2023-08-17 DIAGNOSIS — L97529 Non-pressure chronic ulcer of other part of left foot with unspecified severity: Secondary | ICD-10-CM | POA: Diagnosis not present

## 2023-08-17 DIAGNOSIS — R609 Edema, unspecified: Secondary | ICD-10-CM

## 2023-08-17 DIAGNOSIS — E11621 Type 2 diabetes mellitus with foot ulcer: Secondary | ICD-10-CM | POA: Diagnosis not present

## 2023-08-17 DIAGNOSIS — M86172 Other acute osteomyelitis, left ankle and foot: Secondary | ICD-10-CM | POA: Diagnosis not present

## 2023-08-17 DIAGNOSIS — Z794 Long term (current) use of insulin: Secondary | ICD-10-CM | POA: Diagnosis not present

## 2023-08-17 LAB — CBC WITH DIFFERENTIAL/PLATELET
Abs Immature Granulocytes: 0.1 10*3/uL — ABNORMAL HIGH (ref 0.00–0.07)
Basophils Absolute: 0.1 10*3/uL (ref 0.0–0.1)
Basophils Relative: 0 %
Eosinophils Absolute: 0.5 10*3/uL (ref 0.0–0.5)
Eosinophils Relative: 4 %
HCT: 31.7 % — ABNORMAL LOW (ref 39.0–52.0)
Hemoglobin: 10.3 g/dL — ABNORMAL LOW (ref 13.0–17.0)
Immature Granulocytes: 1 %
Lymphocytes Relative: 18 %
Lymphs Abs: 2 10*3/uL (ref 0.7–4.0)
MCH: 27 pg (ref 26.0–34.0)
MCHC: 32.5 g/dL (ref 30.0–36.0)
MCV: 83.2 fL (ref 80.0–100.0)
Monocytes Absolute: 0.8 10*3/uL (ref 0.1–1.0)
Monocytes Relative: 7 %
Neutro Abs: 7.8 10*3/uL — ABNORMAL HIGH (ref 1.7–7.7)
Neutrophils Relative %: 70 %
Platelets: 282 10*3/uL (ref 150–400)
RBC: 3.81 MIL/uL — ABNORMAL LOW (ref 4.22–5.81)
RDW: 15.2 % (ref 11.5–15.5)
WBC: 11.3 10*3/uL — ABNORMAL HIGH (ref 4.0–10.5)
nRBC: 0 % (ref 0.0–0.2)

## 2023-08-17 LAB — BASIC METABOLIC PANEL WITH GFR
Anion gap: 11 (ref 5–15)
BUN: 19 mg/dL (ref 6–20)
CO2: 27 mmol/L (ref 22–32)
Calcium: 9 mg/dL (ref 8.9–10.3)
Chloride: 101 mmol/L (ref 98–111)
Creatinine, Ser: 1.17 mg/dL (ref 0.61–1.24)
GFR, Estimated: >60 mL/min (ref >60)
Glucose, Bld: 163 mg/dL — ABNORMAL HIGH (ref 70–99)
Potassium: 4.4 mmol/L (ref 3.5–5.1)
Sodium: 139 mmol/L (ref 135–145)

## 2023-08-17 LAB — SURGICAL PATHOLOGY

## 2023-08-17 LAB — CK: Total CK: 26 U/L — ABNORMAL LOW (ref 49–397)

## 2023-08-17 LAB — GLUCOSE, CAPILLARY
Glucose-Capillary: 177 mg/dL — ABNORMAL HIGH (ref 70–99)
Glucose-Capillary: 100 mg/dL — ABNORMAL HIGH (ref 70–99)
Glucose-Capillary: 201 mg/dL — ABNORMAL HIGH (ref 70–99)

## 2023-08-17 MED ORDER — CHLORHEXIDINE GLUCONATE CLOTH 2 % EX PADS
6.0000 | MEDICATED_PAD | Freq: Every day | CUTANEOUS | Status: DC
  Administered 2023-08-17: 6 via TOPICAL

## 2023-08-17 MED ORDER — DAPTOMYCIN IV (FOR PTA / DISCHARGE USE ONLY): 800.0000 mg | INTRAVENOUS | 0 refills | Status: AC

## 2023-08-17 MED ORDER — OXYCODONE HCL 10 MG PO TABS: 10.0000 mg | ORAL_TABLET | ORAL | 0 refills | Status: DC | PRN

## 2023-08-17 MED ORDER — SODIUM CHLORIDE 0.9% FLUSH: 10.0000 mL | INTRAVENOUS | Status: DC | PRN

## 2023-08-17 MED ORDER — HYDROXYZINE HCL 25 MG PO TABS: 25.0000 mg | ORAL_TABLET | Freq: Three times a day (TID) | ORAL | 0 refills | Status: DC | PRN

## 2023-08-17 MED ORDER — SODIUM CHLORIDE 0.9% FLUSH
10.0000 mL | Freq: Two times a day (BID) | INTRAVENOUS | Status: DC
Start: 2023-08-17 — End: 2023-08-18

## 2023-08-17 MED ORDER — ACETAMINOPHEN 500 MG PO TABS: 1000.0000 mg | ORAL_TABLET | Freq: Four times a day (QID) | ORAL | 0 refills | Status: AC | PRN

## 2023-08-17 MED ORDER — METHOCARBAMOL 1000 MG PO TABS: 1000.0000 mg | ORAL_TABLET | Freq: Two times a day (BID) | ORAL | 0 refills | Status: DC

## 2023-08-17 MED ORDER — TECHNETIUM TO 99M ALBUMIN AGGREGATED
4.4000 | Freq: Once | INTRAVENOUS | Status: AC | PRN
Start: 2023-08-17 — End: 2023-08-17
  Administered 2023-08-17: 4.4 via INTRAVENOUS

## 2023-08-17 NOTE — Progress Notes (Addendum)
    [facility]  Date of Admission:  08/10/2023     Reason for Follow Up: Diabetic foot ulcer ([name])        Brief Progress Note:  Diagnosis:  Diabetic foot ulcer / osteomyelitis   Culture Result: Staphylococcus simulans / Group B Streptococcus / previous MRSA  No Known Allergies  OPAT Orders Discharge antibiotics to be given via PICC line Discharge antibiotics: Daptomycin 800 mg IV q 24  Per pharmacy protocol   Duration: 4 weeks  End Date: 09/11/23  [facility] Care Per Protocol:  Home health RN for IV administration and teaching; PICC line care and labs.    Labs weekly while on IV antibiotics: _X_ CBC with differential __ BMP __ CMP _X_ CRP _X_ ESR __ Vancomycin  trough _X_ CK  __ Please pull PIC at completion of IV antibiotics _X_ Please leave PIC in place until doctor has seen patient or been notified  Fax weekly labs to [phone]  Clinic Follow Up Appt:  09/16/2023 at 9:15 am with Dr. Cathlyn Coast, NP [facility]  08/17/2023  1:40 PM

## 2023-08-17 NOTE — TOC Transition Note (Signed)
 Transition of Care [facility]) - Discharge Note   Patient Details  Name: Justin Hall MRN: [id number] Date of Birth: 12/26/1965  Transition of Care [facility]) CM/SW Contact:  Alisa App, RN Phone Number: 08/17/2023, 3:47 PM   Clinical Narrative:    Patient will DC to: home Anticipated DC date: 08/17/2023 Family notified: yes Transport by: car  Per MD patient ready for DC today once home infusion teaching completed by Pam this evening. RN, patient, patient's wife, Artavia/[facility] and  Pam/[facility] aware of DC.    Approval received for home wound vac. NCM delivered vac to bedside. MD placed home vac @ bedside. Post hospital f/u noted on AVS. Pt without RX med concerns .  Wife to provide transportation to home.  RNCM will sign off for now as intervention is no longer needed. Please consult us  again if new needs arise.    Final next level of care: Home w Home Health Services Barriers to Discharge: No Barriers Identified   Patient Goals and CMS Choice     Choice offered to / list presented to : Patient      Discharge Placement                       Discharge Plan and Services Additional resources added to the After Visit Summary for     Discharge Planning Services: CM Consult              DME Agency: Sharol Decamp Esteban Heinrich) Date DME Agency Contacted: 08/13/23 Time DME Agency Contacted: [phone] Representative spoke with at DME Agency: Sherrlyn Dolores HH Arranged: RN HH Agency: [facility] Date HH Agency Contacted: 08/13/23 Time HH Agency Contacted: [phone] Representative spoke with at [facility] Agency: Renetta Carter  Social Drivers of Health (SDOH) Interventions SDOH Screenings   Food Insecurity: No Food Insecurity (08/10/2023)  Recent Concern: Food Insecurity - Food Insecurity Present (05/14/2023)  Housing: Low Risk  (08/10/2023)  Transportation Needs: No Transportation Needs (08/10/2023)  Utilities: Not At Risk (08/10/2023)  Alcohol  Screen:  Low Risk  (05/14/2023)  Depression (PHQ2-9): Low Risk  (05/15/2023)  Financial Resource Strain: Medium Risk (05/14/2023)  Physical Activity: Insufficiently Active (05/14/2023)  Social Connections: Moderately Isolated (05/14/2023)  Stress: Stress Concern Present (05/14/2023)  Tobacco Use: Low Risk  (08/14/2023)     Readmission Risk Interventions     No data to display

## 2023-08-17 NOTE — Progress Notes (Signed)
 [facility]    Date of Admission:  08/10/2023     ID: Justin Hall is a 58 y.o. male with   Principal Problem:   Diabetic foot ulcer ([name]) Active Problems:   CKD (chronic kidney disease) stage 1, GFR 90 ml/min or greater   Dyslipidemia   Pain   Pyogenic inflammation of bone ([name])    Subjective: Feel better than yesterday. Less short of breath. No fevers. Mild  left foot pain  Medications:   allopurinol   300 mg Oral BID   amLODipine   5 mg Oral Daily   atorvastatin   80 mg Oral Daily   carvedilol   6.25 mg Oral BID WC   dextrose   1 ampule Intravenous Once   enoxaparin  (LOVENOX ) injection  150 mg Subcutaneous Q12H   gabapentin   300 mg Oral TID   glimepiride  1 mg Oral Q breakfast   insulin  aspart  0-15 Units Subcutaneous TID WC   insulin  aspart  0-5 Units Subcutaneous QHS   methocarbamol   1,000 mg Oral BID   mupirocin  ointment  1 Application Nasal BID   rOPINIRole   4 mg Oral q AM   rOPINIRole   8 mg Oral q1800   senna-docusate  2 tablet Oral BID   sodium chloride  flush  3 mL Intravenous Q12H    Objective: Vital signs in last 24 hours: Temp:  [98.3 F (36.8 C)] 98.3 F (36.8 C) (04/28 0509) Pulse Rate:  [79-85] 82 (04/28 0509) Resp:  [16-18] 16 (04/28 0509) BP: (163-182)/(87-96) 182/88 (04/28 0509) SpO2:  [91 %-96 %] 96 % (04/28 0509) FiO2 (%):  [36 %] 36 % (04/27 1949) Physical Exam  Constitutional: He is oriented to person, place, and time. He appears well-developed and well-nourished. No distress.  HENT:  Mouth/Throat: Oropharynx is clear and moist. No oropharyngeal exudate.  Cardiovascular: Normal rate, regular rhythm and normal heart sounds. Exam reveals no gallop and no friction rub.  No murmur heard.  Pulmonary/Chest: Effort normal and breath sounds normal. No respiratory distress. He has no wheezes.  Abdominal: Soft. Bowel sounds are normal. He exhibits no distension. There is no tenderness.  Lymphadenopathy:  He has no cervical  adenopathy.  Neurological: He is alert and oriented to person, place, and time.  Ext; left foot wrapped with wound vac Psychiatric: He has a normal mood and affect. His behavior is normal.    Lab Results Recent Labs    08/16/23 0544 08/17/23 0716  WBC 13.6* 11.3*  HGB 9.7* 10.3*  HCT 30.7* 31.7*  NA 138 139  K 4.6 4.4  CL 101 101  CO2 28 27  BUN 25* 19  CREATININE 1.39* 1.17   Liver Panel No results for input(s): "PROT", "ALBUMIN", "AST", "ALT", "ALKPHOS", "BILITOT", "BILIDIR", "IBILI" in the last 72 hours. Sedimentation Rate No results for input(s): "ESRSEDRATE" in the last 72 hours. C-Reactive Protein No results for input(s): "CRP" in the last 72 hours.  Microbiology:  Studies/Results: VAS US  LOWER EXTREMITY VENOUS (DVT) Result Date: 08/17/2023  Lower Venous DVT Study Patient Name:  JOURDIN Guinyard  Date of Exam:   08/17/2023 Medical Rec #: [id number]      Accession #:    [id number] Date of Birth: 1965-09-28       Patient Gender: M Patient Age:   58 years Exam Location:  [facility] Procedure:      VAS US  LOWER EXTREMITY VENOUS (DVT) Referring Phys: Verlie Glisson --------------------------------------------------------------------------------  Indications: Edema.  Limitations: Body habitus. Comparison Study: Prior  negative left LEV done 07/19/22 at [facility] Performing Technologist: Carleene Chase RVS  Examination Guidelines: A complete evaluation includes B-mode imaging, spectral Doppler, color Doppler, and power Doppler as needed of all accessible portions of each vessel. Bilateral testing is considered an integral part of a complete examination. Limited examinations for reoccurring indications may be performed as noted. The reflux portion of the exam is performed with the patient in reverse Trendelenburg.  +---------+---------------+---------+-----------+----------+--------------+ RIGHT    CompressibilityPhasicitySpontaneityPropertiesThrombus Aging  +---------+---------------+---------+-----------+----------+--------------+ CFV      Full           Yes      Yes                                 +---------+---------------+---------+-----------+----------+--------------+ SFJ      Full                                                        +---------+---------------+---------+-----------+----------+--------------+ FV Prox  Full           Yes      Yes                                 +---------+---------------+---------+-----------+----------+--------------+ FV Mid   Full                                                        +---------+---------------+---------+-----------+----------+--------------+ FV DistalFull           Yes      Yes                                 +---------+---------------+---------+-----------+----------+--------------+ PFV      Full                                                        +---------+---------------+---------+-----------+----------+--------------+ POP      Full           Yes      Yes                                 +---------+---------------+---------+-----------+----------+--------------+ PTV      Full                                                        +---------+---------------+---------+-----------+----------+--------------+ PERO     Full                                                        +---------+---------------+---------+-----------+----------+--------------+  Gastroc  Full                                                        +---------+---------------+---------+-----------+----------+--------------+   +---------+---------------+---------+-----------+----------+--------------+ LEFT     CompressibilityPhasicitySpontaneityPropertiesThrombus Aging +---------+---------------+---------+-----------+----------+--------------+ CFV      Full           Yes      Yes                                  +---------+---------------+---------+-----------+----------+--------------+ SFJ      Full                                                        +---------+---------------+---------+-----------+----------+--------------+ FV Prox  Full                                                        +---------+---------------+---------+-----------+----------+--------------+ FV Mid   Full           Yes      Yes                                 +---------+---------------+---------+-----------+----------+--------------+ FV DistalFull           Yes      Yes                                 +---------+---------------+---------+-----------+----------+--------------+ PFV      Full                                                        +---------+---------------+---------+-----------+----------+--------------+ POP      Full           Yes      Yes                                 +---------+---------------+---------+-----------+----------+--------------+ PTV      Full                                                        +---------+---------------+---------+-----------+----------+--------------+ PERO     Full                                                        +---------+---------------+---------+-----------+----------+--------------+  Summary: RIGHT: - There is no evidence of deep vein thrombosis in the lower extremity.  - No cystic structure found in the popliteal fossa.  LEFT: - There is no evidence of deep vein thrombosis in the lower extremity. However, portions of this examination were limited- see technologist comments above.  - No cystic structure found in the popliteal fossa. - Ultrasound characteristics of enlarged lymph nodes noted in the groin.  *See table(s) above for measurements and observations.    Preliminary    DG CHEST PORT 1 VIEW Result Date: 08/16/2023 CLINICAL DATA:  10026 Shortness of breath 10026 EXAM: PORTABLE CHEST 1 VIEW COMPARISON:  Chest x-ray  07/19/2022 FINDINGS: The heart and mediastinal contours are within normal limits. Low lung volumes. No focal consolidation. No pulmonary edema. No pleural effusion. No pneumothorax. No acute osseous abnormality. IMPRESSION: Low lung volumes with no active disease. Electronically Signed   By: Morgane  Naveau M.D.   On: 08/16/2023 20:11   US  EKG SITE RITE Result Date: 08/16/2023 If Site Rite image not attached, placement could not be confirmed due to current cardiac rhythm.    Assessment/Plan: Left foot DFU/OM s/p partial 5th ray amputation = awaiting to get picc line today and plan for 4 wk of daptomycin to cover current cx resutls but also past hx of MRSA.  Opat listed in previous note.  Shortness of breath = in part thought to be with anxiety but continue on home meds for diastolic heart failure.  Will set up appt for follow up in 3-4 wl   [facility] for Infectious Diseases Pager: [phone]  08/17/2023, 1:15 PM

## 2023-08-17 NOTE — Progress Notes (Signed)
 VASCULAR LAB    Bilateral lower extremity venous duplex has been performed.  See CV proc for preliminary results.   Algie Westry, RVT 08/17/2023, 9:20 AM

## 2023-08-17 NOTE — Progress Notes (Signed)
 Peripherally Inserted Central Catheter Placement  The IV Nurse has discussed with the patient and/or persons authorized to consent for the patient, the purpose of this procedure and the potential benefits and risks involved with this procedure.  The benefits include less needle sticks, lab draws from the catheter, and the patient may be discharged home with the catheter. Risks include, but not limited to, infection, bleeding, blood clot (thrombus formation), and puncture of an artery; nerve damage and irregular heartbeat and possibility to perform a PICC exchange if needed/ordered by physician.  Alternatives to this procedure were also discussed.  Bard Power PICC patient education guide, fact sheet on infection prevention and patient information card has been provided to patient /or left at bedside.    PICC Placement Documentation  PICC Single Lumen 08/17/23 Right Basilic 43 cm 0 cm (Active)  Indication for Insertion or Continuance of Line Home intravenous therapies (PICC only) 08/17/23 1549  Exposed Catheter (cm) 0 cm 08/17/23 1549  Site Assessment Clean, Dry, Intact 08/17/23 1549  Line Status Flushed;Saline locked;Blood return noted 08/17/23 1549  Dressing Type Transparent;Securing device 08/17/23 1549  Dressing Status Antimicrobial disc/dressing in place;Clean, Dry, Intact 08/17/23 1549  Line Care Connections checked and tightened 08/17/23 1549  Line Adjustment (NICU/IV Team Only) No 08/17/23 1549  Dressing Intervention Adhesive placed at insertion site (IV team only) 08/17/23 1549  Dressing Change Due 08/24/23 08/17/23 1549       Daphney Eans 08/17/2023, 3:53 PM

## 2023-08-17 NOTE — Progress Notes (Signed)
 PHARMACY CONSULT NOTE FOR:  OUTPATIENT  PARENTERAL ANTIBIOTIC THERAPY (OPAT)  Indication: L foot DFU/ osteomyelitis Regimen: daptomycin 800 mg IV Q24H  End date: 09/11/23 (4 weeks from I&D 4/25)  IV antibiotic discharge orders are pended. To discharging provider:  please sign these orders via discharge navigator,  Select New Orders & click on the button choice - Manage This Unsigned Work.     Thank you for allowing pharmacy to be a part of this patient's care.  Justin Hall 08/17/2023, 10:51 AM

## 2023-08-17 NOTE — Plan of Care (Signed)
  Problem: Clinical Measurements: Goal: Diagnostic test results will improve Outcome: Progressing   Problem: Clinical Measurements: Goal: Respiratory complications will improve Outcome: Progressing   Problem: Activity: Goal: Risk for activity intolerance will decrease Outcome: Progressing   Problem: Pain Managment: Goal: General experience of comfort will improve and/or be controlled Outcome: Progressing   Problem: Safety: Goal: Ability to remain free from injury will improve Outcome: Progressing

## 2023-08-17 NOTE — Progress Notes (Addendum)
 4.27 note     TRH ROUNDING NOTE Justin Hall EAV:[id number]  DOB: [birth year]  DOA: 08/10/2023  PCP: Jobe Mulder, DO  08/16/2023,5:34 PM  LOS: 6 days    Code Status: Full code   from: Home    current Dispo: Possible skilled             58 year old manage BMI 47 DM 2 x 2 HLD gout OSA on CPAP bipolar gout Chronic chest pain with negative cardiac cath 2019--previous admission worsening diastolic dysfunction-osteoarthritis left hip with progressively worsening pain and dysfunction status post total hip arthroplasty left side 01/2020   4/21 present [facility] after being seen 4/8 PCP with cellulitis of foot and ulcer lateral aspect of fifth toe at metatarsal phalangeal joint was prescribed 10-day course doxycycline -significantly worsened new swelling erythema foul discharge and came to be evaluated--this occurred on dorsal aspect of foot Sodium 136 potassium 4.3 BUN/creatinine 34/1.37 lactic acid 2.0 CRP 16--WBC 10.5 hemoglobin 11.3 platelet 252 Foot x-ray left side soft tissue swelling with 5 metatarsal phalangeal joint compatible with infection moderate soft tissue swelling.  Toes worsened compared to prior focal lucencies lateral aspect head of fifth metatarsal indeterminate-osteomyelitis not excluded MRI foot osteomyelitis fifth metatarsal head distal half of fifth metatarsal shaft with probable adjacent osteo within fifth proximal phalanx extensive inflammatory changes with ill-defined peripherally enhancing fluid collections wrapping around plantar lateral aspect fifth metatarsal head.  Degenerative changes as first metatarsophalangeal joint throughout midfoot Started on Zosyn  and vancomycin  in the ED 4/22 Dr. Rosemarie Conquest consulted --s/p initial debridement-- partial left foot fifth ray amputation with antibiotic dissolvable beads and dermal allograft 38 cm--- to be nonweightbearing to left foot 4/25  repeat debridement of   Plan   Left foot wound dorsal and plantar aspect  of fifth metatarsal with inflammatory soft tissue infection--failed outpatient management with progression of disease-- 4/22 debridement--4/25 subsequent debridement macroscopic clean margins--growing Staph simulans await speciation prior to narrowing-antibiotics adjusted vancomycin  to iv linezolid  600 every 12, Zosyn  3.3 every 8-nonweightbearing at discharge--Has postop shoe as well as wound VAC in place ID consulted 4/27 transitioned antibiotics to Cubicin -likely 4 weeks therapy ending 5/19--PICC line ordered Pain moderate --scheduled Tylenol  now 1000 every 6 as needed, Oxy IR remains 10 Q3 --Robaxin  1000 twice daily   AKI PTA on Lasix  40 twice daily, lisinopril  20--now held-ns as above   Mild SOB-no hypoxia ?  Volume secondary to repletion holding Lasix  DDx PE-start with D-dimer-would place on Lovenox /heparin  [apparently was placed on SCD postsurgery] If dimer elevated would proceed to V/Q scan and start treatment dosing of Lovenox  while waiting on results   DM TY 2 Metformin  1 g twice daily held-using Amaryl 1 mg daily breakfast for now-gabapentin  300 3 times daily resumed PCP was adjusting Mounjaro as an outpatient-defer to outpatient CBGs 98-1 60 with SSI no need long-acting   HTN Diastolic heart failure Coreg  6.25 twice daily resumed--Lasix  held for now    [place] for [facility] Continuing overnight CPAP Some shortness of breath today-see above discussion   Anxiety-hydroxyzine  25 3 times daily as needed for anxiety Addins low dose Ambien  5 mg for sleep Watch mentation   Restless legs Continue Requip  4 in the morning-schedule the p.m. dose to 6 PM,--- Mirapex  discontinued   DVT prophylaxis: Lovenox  subcu   Status is: Inpatient Remains inpatient appropriate because:    Requires definitive management   Subjective:   Some short of breath today-states that respiratory changed to set up  a little bit  dejected that may require PICC line for long-term antibiotics but is willing to  proceed with that plan       Objective + exam       Vitals:    08/16/23 0110 08/16/23 0504 08/16/23 0758 08/16/23 1434  BP:   (!) 178/95 (!) 174/96 (!) 163/87  Pulse:   79 83 79  Resp:   18 18 18   Temp:   97.9 F (36.6 C) 98.7 F (37.1 C) 98.3 F (36.8 C)  TempSrc:   Oral Oral Oral  SpO2: 94% 92% 97% 92%  Weight:          Height:                 Filed Weights    08/10/23 1756 08/14/23 0808 08/14/23 0819  Weight: (!) 147 kg (!) (P) 147 kg (!) 147 kg      Examination:     Pleasant coherent no distress Mallampati 4 CTAB no rales wheeze rhonchi S1-S2 no murmur Data Reviewed: reviewed    CBC Labs (Brief)          Component Value Date/Time    WBC 13.6 (H) 08/16/2023 0544    RBC 3.66 (L) 08/16/2023 0544    HGB 9.7 (L) 08/16/2023 0544    HGB 12.4 (L) 07/14/2019 1247    HCT 30.7 (L) 08/16/2023 0544    HCT 37.5 07/14/2019 1247    PLT 294 08/16/2023 0544    PLT 281 07/14/2019 1247    MCV 83.9 08/16/2023 0544    MCV 82 07/14/2019 1247    MCH 26.5 08/16/2023 0544    MCHC 31.6 08/16/2023 0544    RDW 15.2 08/16/2023 0544    RDW 14.6 07/14/2019 1247    LYMPHSABS 2.0 08/16/2023 0544    LYMPHSABS 3.3 (H) 07/14/2019 1247    MONOABS 1.0 08/16/2023 0544    EOSABS 0.7 (H) 08/16/2023 0544    EOSABS 0.5 (H) 07/14/2019 1247    BASOSABS 0.1 08/16/2023 0544    BASOSABS 0.1 07/14/2019 1247          Latest Ref Rng & Units 08/16/2023    5:44 AM 08/15/2023    6:54 AM 08/14/2023    4:52 AM  CMP  Glucose 70 - 99 mg/dL 130  865  784   BUN 6 - 20 mg/dL 25  28  26    Creatinine 0.61 - 1.24 mg/dL 6.96  2.95  2.84   Sodium 135 - 145 mmol/L 138  141  138   Potassium 3.5 - 5.1 mmol/L 4.6  4.1  4.1   Chloride 98 - 111 mmol/L 101  98  100   CO2 22 - 32 mmol/L 28  28  27    Calcium  8.9 - 10.3 mg/dL 8.6  8.9  8.6       Scheduled Meds:  allopurinol   300 mg Oral BID   amLODipine   5 mg Oral Daily   atorvastatin   80 mg Oral Daily   carvedilol   6.25 mg Oral BID WC   dextrose   1 ampule  Intravenous Once   gabapentin   300 mg Oral TID   glimepiride  1 mg Oral Q breakfast   heparin  injection (subcutaneous)  5,000 Units Subcutaneous Q8H   insulin  aspart  0-15 Units Subcutaneous TID WC   insulin  aspart  0-5 Units Subcutaneous QHS   methocarbamol   1,000 mg Oral BID   mupirocin  ointment  1 Application Nasal BID   rOPINIRole   4 mg Oral q AM  rOPINIRole   8 mg Oral q1800   senna-docusate  2 tablet Oral BID   sodium chloride  flush  3 mL Intravenous Q12H        Continuous Infusions:  DAPTOmycin          Time  35   Justin Faren Florence, MD  [facility]

## 2023-08-17 NOTE — Progress Notes (Signed)
 Pt's BP elevated.  Notified MD.  212/117 and now 194/101.   Pt's pain 7/10 and states he is very anxious.  Orders to give atarax  and pain meds now.  Instructions for pt to follow up outpt for BP.

## 2023-08-17 NOTE — Progress Notes (Signed)
 Subjective:  Patient ID: Justin Hall, male    DOB: 16-Dec-1965,  MRN: [id number]  Chief Complaint  Patient presents with   Cellulitis    DOS: 08/12/2023 Procedure: 1.  Partial fifth ray amputation, left foot 2.  Application antibiotic dissolvable beads, left foot 3.  Application dermal allograft 38 cm, left foot  DOS 08/14/23 1.  Repeat irrigation and debridement of surgical site with prep for graft, 5.5 x 5 cm, left foot 2.  Application dermal allograft 5 x 5 cm, left foot 3.  Application negative pressure wound therapy 5.5 x 5 cm, left foot  58 y.o. male seen for post op check.  He reports doing well, no complaints today. He had home vac delivered to room while I was talking to him, I switched him over to home vac and discussed plans for home health / follow up with me next week. All questions were answered and he is in agreement with plan.   Review of Systems: Negative except as noted in the HPI. Denies N/V/F/Ch.   Objective:   Vitals:   08/16/23 2041 08/17/23 0509  BP: (!) 170/96 (!) 182/88  Pulse: 85 82  Resp: 16 16  Temp:  98.3 F (36.8 C)  SpO2: 91% 96%   Body mass index is 47.86 kg/m. Constitutional Well developed. Well nourished.  Vascular Foot warm and well perfused. Capillary refill normal to all digits.   No calf pain with palpation  Neurologic Normal speech. Oriented to person, place, and time. Epicritic sensation grossly intact to the left lower extremity  Dermatologic Wound VAC is intact and functioning with approximately 50 to 100 mL serosanguineous in canister -no strikethrough on current dressing, re wrapped outer layer. No erythema surrounding wound.   Orthopedic: Status post partial fifth ray amputation left foot with large soft tissue deficit   Radiographs: Status post interval amputation fifth ray at the level of the proximal fifth met shaft with antibiotic beads placed  Pathology: A. TOE, LEFT FIFTH, AMPUTATION:       Chronic osteomyelitits.        Subcutaneous tissue with acute inflammation, ulceration and abscess  formation.       Resection margin is viable without necrosis or acute  osteomyelitis.   B. BONE, LEFT FIFTH METATARSAL PROXIMAL, MARGIN:       Bone with reactive changes.       Negative for osteomyelitits.    Micro:  STAPHYLOCOCCUS SIMULANS , GROUP B STREP(S.AGALACTIAE)ISOLATED   Assessment:   Osteomyelitis of fifth ray status post partial fifth ray amputation, now s/p repeat debridement, integra and vac application.  Plan:  Patient was evaluated and treated and all questions answered.  POD # 3 s/p repeat debridement, Integra graft, vac application -Appears to be doing well with vac therapy. Will leave intact - switched him to home vac. Next change planned for tmrw with [facility] RN, confirmed bedside with CM.  -Will continue plans for 2x weekly vac change per [facility] RN. -XR: Expected postop changes -WB Status: Try to maintain NWB as much as possible, ok for short distances in CAM boot which he has bedside, says he feels stable with that -Sutures: None present -Medications/ABX: Abx per ID, PICC placed, plan 4 weeks abx - Pt to follow up next week Tuesday May 6 - office will call to arrange.  - No evidence of DVT/PE per US / VQ scan today - Will sign off please call with questions/ concerns        Maridee Shoemaker, DPM [facility]

## 2023-08-17 NOTE — Plan of Care (Addendum)
 Discharge instructions discussed with patient.  Patient instructed on home medications, restrictions, and follow up appointments, wound vac, and PICC. Belongings gathered and sent with patient.  Patients medications sent to [facility].    Patient BP is currently elevated with bedside RN managing.

## 2023-08-17 NOTE — Discharge Summary (Addendum)
 Physician Discharge Summary  Justin Hall NWG:[id number] DOB: July 24, 1965 DOA: 08/10/2023  PCP: Justin Mulder, DO  Admit date: 08/10/2023 Discharge date: 08/17/2023  Time spent: 45 minutes  Recommendations for Outpatient Follow-up:  Requires outpatient follow-up Justin Hall 09/16/2023 as will be on Cubicin will need periodic CK etc. and lab follow-up to be determined as per [facility]-weekly CBC CRP ESR CK which can be done at PCP office Needs wound check Justin Hall 1 week or as per him Pain control as below  Discharge Diagnoses:  MAIN problem for hospitalization   Osteomyelitis left fifth toe  Please see below for itemized issues addressed in [facility]- refer to other progress notes for clarity if needed  Discharge Condition: Improved  Diet recommendation: Diabetic  Filed Weights   08/10/23 1756 08/14/23 0808 08/14/23 0819  Weight: (!) 147 kg (!) (P) 147 kg (!) 147 kg    History of present illness:  58 year old manage BMI 47 DM 2 x 2 HLD gout OSA on CPAP bipolar gout Chronic chest pain with negative cardiac cath 2019--previous admission worsening diastolic dysfunction-osteoarthritis left hip with progressively worsening pain and dysfunction status post total hip arthroplasty left side 01/2020   58 year old manage BMI 47 DM 2 x 2 HLD gout OSA on CPAP bipolar gout Chronic chest pain with negative cardiac cath 2019--previous admission worsening diastolic dysfunction-osteoarthritis left hip with progressively worsening pain and dysfunction status post total hip arthroplasty left side 01/2020 after being seen 4/8 PCP with cellulitis of foot and ulcer lateral aspect of fifth toe at metatarsal phalangeal joint was prescribed 10-day course doxycycline -significantly worsened new swelling erythema foul discharge and came to be evaluated--this occurred on dorsal aspect of foot Sodium 136 potassium 4.3 BUN/creatinine 34/1.37 lactic acid 2.0 CRP 16--WBC 10.5 hemoglobin 11.3 platelet 252 Foot x-ray left side soft tissue swelling with 5 metatarsal phalangeal joint compatible with infection moderate soft tissue swelling.  Toes worsened compared to prior focal lucencies lateral aspect head of fifth metatarsal indeterminate-osteomyelitis not excluded MRI  foot osteomyelitis fifth metatarsal head distal half of fifth metatarsal shaft with probable adjacent osteo within fifth proximal phalanx extensive inflammatory changes with ill-defined peripherally enhancing fluid collections wrapping around plantar lateral aspect fifth metatarsal head.  Degenerative changes as first metatarsophalangeal joint throughout midfoot Started on Zosyn  and vancomycin  in the ED 4/22 Justin Hall consulted --s/p initial debridement-- partial left foot fifth ray amputation with antibiotic dissolvable beads and dermal allograft 38 cm--- to be nonweightbearing to left foot 4/25  repeat debridement of area   Plan   Left fifth metatarsal dorsal foot wound-left fifth ray amputation 4/22: Debridement dressing change 4/25 ID consulted 4/27-- growing Staph simulans 4/23 culture--- currently on daptomycin IV PICC line placed 4/28  EOT 5/23-periodic CK check--- ID saw the patient and want to see him with routine labs as above and we will follow him on 5/28 in office number given Pain control Tylenol  1000 every 6 as needed, Oxy IR 10 Q3 as needed moderate pain, gabapentin  300 3 times daily Robaxin  1000 twice daily Boot ordered for transfers mostly nonweightbearing Prevena wound VAC ordered additionally by podiatry service and they will call him to schedule appointment   SOB no hypoxia 4/27 CXR no acute-low lung volumes-need to rule out PE D-dimer 3.5 but this was felt to be likely secondary to infectious etiology Duplex lower extremity came back negative and VQ scan negative therefore no further treatment no need for anticoagulation but does need to pump heels and move   Dm ty ii.  Moderately controlled between 89 and 177 eating 100% of meals Metformin  replaced Amaryl 1 mg every mornin transiently and went back to metformin  at discharge as labs were okay Has not started Mounjaro defer to PCP  as outpatient for titration--- using gabapentin  as above   HTN Chronic HFpEF last echo  10/17/2018 50-55% PTA Lasix  40, lisinopril  20 held--1 small dose given 4/27 Lasix  Outpatient reeval for reinitiation of diuretics/ACE -blood pressure elevated-continue amlodipine  5 Coreg  6.25 twice daily and uptitrate as needed in the outpatient setting if labs will tolerate   AKI previously Peak creatinine 0.6 and now improved after given fluids which have been discontinued   Anxiety insomnia Started hydroxyzine  25 3 times daily this hospital stay and Ambien  5 at bedtime as needed sleep These meds were discontinued.  As we felt that this was likely situational and he can be followed in the outpatient   Restless leg syndrome  Giving Requip  4 AM 8 mg 6 PM Mirapex  discontinued   OHSS Continue CPAP at night  Morbid obesity OHSS BMI 47 Life-threatening needs outpatient weight loss counseling-Mounjaro?  Discharge Exam: Vitals:   08/16/23 2041 08/17/23 0509  BP: (!) 170/96 (!) 182/88  Pulse: 85 82  Resp: 16 16  Temp:  98.3 F (36.8 C)  SpO2: 91% 96%    Subj on day of d/c   Coherent awake alert no distress  General Exam on discharge  EOMI NCAT no focal deficit no wheeze rales rhonchi Abdominal soft no rebound or guarding ROM intact Wound not examined today Power 5/5 overall otherwise Psych euthymic  Discharge Instructions   Discharge Instructions     [facility] pharmacist to adjust dose for Vancomycin , Aminoglycosides and other anti-infective therapies as requested by physician.   Complete by: As directed    [facility] to provide Cath Flo 2mg    Complete by: As directed    Administer for PICC line occlusion and as ordered by physician for other access device issues.   Anaphylaxis Kit: Provided to treat any anaphylactic reaction to the medication being provided to the patient if First Dose or when requested by physician   Complete by: As directed    Epinephrine  1mg /ml vial / amp: Administer 0.3mg  (0.25ml) subcutaneously once for moderate to severe  anaphylaxis, nurse to call physician and pharmacy when reaction occurs and call 911 if needed for immediate care   Diphenhydramine  50mg /ml IV vial: Administer 25-50mg  IV/IM PRN for first dose reaction, rash, itching, mild reaction, nurse to call physician and pharmacy when reaction occurs   Sodium Chloride  0.9% NS 500ml IV: Administer if needed for hypovolemic blood pressure drop or as ordered by physician after call to physician with anaphylactic reaction   Change dressing on IV access line weekly and PRN   Complete by: As directed    Diet - low sodium heart healthy   Complete by: As directed    Discharge instructions   Complete by: As directed    You will need to use a PICC line and continue antibiotics until follow-up in the outpatient setting with infectious disease-this will be set up and we will follow-up with them for routine labs etc. etc.-please continue the antibiotics as per dosing and get labs in about 1 week You did not have any blood clots noted on any of the testing-you need to be mobile as much as possible pump your heels and follow-up with Justin Hall It would be a good idea for you to hold off on your Lasix  and lisinopril  for the time being and make sure that based on labs your primary care physician is aware of this and reduces these medications as they feel appropriate-your shortness of breath was not secondary to a  blood clot but probably a little bit of extra fluid or anxiety Pain control will be with oxycodone  which we have called in-you may take Robaxin  for spasm-continue the high-dose Tylenol  around-the-clock short-term and eventually you may not need the oxycodone  for breakthrough as you heal   Face-to-face encounter (required for Medicare/Medicaid patients)   Complete by: As directed    I Justin Hall certify that this patient is under my care and that I, or a nurse practitioner or physician's assistant working with me, had a face-to-face encounter that meets the  physician face-to-face encounter requirements with this patient on 08/17/2023. The encounter with the patient was in whole, or in part for the following medical condition(s) which is the primary reason for home health care (List medical condition):   Needs R supervision for PICC and teaching   The encounter with the patient was in whole, or in part, for the following medical condition, which is the primary reason for home health care: picc   I certify that, based on my findings, the following services are medically necessary home health services: Nursing   Reason for Medically Necessary Home Health Services: Skilled Nursing- Teaching of Disease Process/Symptom Management   My clinical findings support the need for the above services: Unsafe ambulation due to balance issues   Further, I certify that my clinical findings support that this patient is homebound due to: Shortness of Breath with activity   Flush IV access with Sodium Chloride  0.9% and Heparin  10 units/ml or 100 units/ml   Complete by: As directed    Home Health   Complete by: As directed    To provide the following care/treatments: RN   Home infusion instructions - [facility]   Complete by: As directed    Instructions: Flush IV access with Sodium Chloride  0.9% and Heparin  10units/ml or 100units/ml   Change dressing on IV access line: Weekly and PRN   Instructions Cath Flo 2mg : Administer for PICC Line occlusion and as ordered by physician for other access device   [facility] pharmacist to adjust dose for: Vancomycin , Aminoglycosides and other anti-infective therapies as requested by physician   Increase activity slowly   Complete by: As directed    Leave dressing on - Keep it clean, dry, and intact until clinic visit   Complete by: As directed    Method of administration may be changed at the discretion of home infusion pharmacist based upon assessment of the patient and/or caregiver's ability to  self-administer the medication ordered   Complete by: As directed       Allergies as of 08/17/2023   No Known Allergies      Medication List     STOP taking these medications    furosemide  40 MG tablet Commonly known as: LASIX    lisinopril  20 MG tablet Commonly known as: ZESTRIL    pramipexole  0.125 MG tablet Commonly known as: Mirapex    tirzepatide 10 MG/0.5ML Pen Commonly known as: MOUNJARO   tirzepatide 12.5 MG/0.5ML Pen Commonly known as: MOUNJARO   tirzepatide 15 MG/0.5ML Pen Commonly known as: MOUNJARO   tirzepatide 7.5 MG/0.5ML Pen Commonly known as: MOUNJARO       TAKE these medications    acetaminophen  500 MG tablet Commonly known as: TYLENOL  Take 2 tablets (1,000 mg total) by mouth every 6 (six) hours as needed for moderate pain (pain score 4-6).   allopurinol  300 MG tablet Commonly known as: ZYLOPRIM  Take 1 tablet (300 mg total) by mouth 2 (two) times  daily.   amLODipine  5 MG tablet Commonly known as: NORVASC  Take 1 tablet by mouth daily.   atorvastatin  80 MG tablet Commonly known as: LIPITOR  Take 1 tablet by mouth daily.   blood glucose meter kit and supplies Dispense based on patient and insurance preference. Use up to four times daily as directed. (FOR ICD-10 E10.9, E11.9).   carvedilol  6.25 MG tablet Commonly known as: COREG  Take 1 tablet (6.25 mg total) by mouth 2 (two) times daily with a meal.   daptomycin IVPB Commonly known as: CUBICIN Inject 800 mg into the vein daily for 25 days. Indication:  DFI/osteomyelitis First Dose: Yes Last Day of Therapy:  09/11/23- Please leave PICC in place until doctor has seen pt or been notified  Labs - Once weekly:  CBC/D, BMP, and CPK Labs - Once weekly: ESR and CRP Method of administration: IV Push Method of administration may be changed at the discretion of home infusion pharmacist based upon assessment of the patient and/or caregiver's ability to self-administer the medication ordered.    gabapentin  300 MG capsule Commonly known as: NEURONTIN  TAKE 1 CAPSULE(300 MG) BY MOUTH THREE TIMES DAILY   gentamicin  ointment 0.1 % Commonly known as: GARAMYCIN  Apply 1 Application topically 3 (three) times daily.   hydrOXYzine  25 MG tablet Commonly known as: ATARAX  Take 1 tablet (25 mg total) by mouth 3 (three) times daily as needed for anxiety.   metFORMIN  500 MG 24 hr tablet Commonly known as: Glucophage  XR Take 2 tablets (1,000 mg total) by mouth in the morning and at bedtime.   Methocarbamol  1000 MG Tabs Take 1,000 mg by mouth 2 (two) times daily. What changed:  medication strength how much to take   Oxycodone  HCl 10 MG Tabs Take 1 tablet (10 mg total) by mouth every 3 (three) hours as needed (moderate-severe pain). What changed:  how much to take when to take this reasons to take this Another medication with the same name was removed. Continue taking this medication, and follow the directions you see here.   rOPINIRole  4 MG tablet Commonly known as: REQUIP  Take 4-8 mg by mouth 2 (two) times daily. 4 mg with breakfast and 8 mg at 6p   sildenafil  100 MG tablet Commonly known as: Viagra  Take 0.5-1 tablets (50-100 mg total) by mouth daily as needed for erectile dysfunction.               Durable Medical Equipment  (From admission, onward)           Start     Ordered   08/16/23 1007  For home use only DME Negative pressure wound device  Once       Question Answer Comment  Frequency of dressing change 2 times per week   Length of need 6 Months   Dressing type Foam   Amount of suction 125 mm/Hg   Pressure application Continuous pressure   Supplies 10 canisters and 15 dressings per month for duration of therapy      08/16/23 1007              Discharge Care Instructions  (From admission, onward)           Start     Ordered   08/17/23 0000  Change dressing on IV access line weekly and PRN  (Home infusion instructions - [facility] )        08/17/23 1619   08/17/23 0000  Leave dressing on - Keep it clean, dry,  and intact until clinic visit        08/17/23 1619           No Known Allergies  Follow-up Information     Justin Mulder, DO Follow up.   Specialty: Family Medicine Contact information: [address] [place] [address] [phone]         [facility] Follow up.   Why: home health services will be provided [facility], start of care 08/18/2023        Liane Redman, Justin Hall. Go in 1 month(s).   Specialty: Infectious Diseases Contact information: [address] [place] [address] [phone]                  The results of significant diagnostics from this hospitalization (including imaging, microbiology, ancillary and laboratory) are listed below for reference.    Significant Diagnostic Studies: NM Pulmonary Perfusion Result Date: 08/17/2023 CLINICAL DATA:  Pulmonary embolism suspected.  Short of breath. EXAM: NUCLEAR MEDICINE PERFUSION LUNG SCAN TECHNIQUE: Perfusion images were obtained in multiple projections after intravenous injection of radiopharmaceutical. RADIOPHARMACEUTICALS:  4.4 mCi Tc-7m MAA COMPARISON:  None Available. FINDINGS: No wedge-shaped peripheral perfusion defect within LEFT or RIGHT lung to suggest acute pulmonary embolism. Normal perfusion pattern. IMPRESSION: No evidence acute pulmonary embolism. Electronically Signed   By: Deboraha Fallow M.D.   On: 08/17/2023 15:55   VAS US  LOWER EXTREMITY VENOUS (DVT) Result Date: 08/17/2023  Lower Venous DVT Study Patient Name:  Justin Hall  Date of Exam:   08/17/2023 Medical Rec #: [id number]      Accession #:    [id number] Date of Birth: April 12, 1966       Patient Gender: M Patient Age:   89 years Exam Location:  [facility] Procedure:      VAS US  LOWER EXTREMITY VENOUS (DVT) Referring Phys: Justin Hall  --------------------------------------------------------------------------------  Indications: Edema.  Limitations: Body habitus. Comparison Study: Prior negative left LEV done 07/19/22 at [facility] Performing Technologist: Carleene Chase RVS  Examination Guidelines: A complete evaluation includes B-mode imaging, spectral Doppler, color Doppler, and power Doppler as needed of all accessible portions of each vessel. Bilateral testing is considered an integral part of a complete examination. Limited examinations for reoccurring indications may be performed as noted. The reflux portion of the exam is performed with the patient in reverse Trendelenburg.  +---------+---------------+---------+-----------+----------+--------------+ RIGHT    CompressibilityPhasicitySpontaneityPropertiesThrombus Aging +---------+---------------+---------+-----------+----------+--------------+ CFV      Full           Yes      Yes                                 +---------+---------------+---------+-----------+----------+--------------+ SFJ      Full                                                        +---------+---------------+---------+-----------+----------+--------------+ FV Prox  Full           Yes      Yes                                 +---------+---------------+---------+-----------+----------+--------------+ FV Mid   Full                                                        +---------+---------------+---------+-----------+----------+--------------+  FV DistalFull           Yes      Yes                                 +---------+---------------+---------+-----------+----------+--------------+ PFV      Full                                                        +---------+---------------+---------+-----------+----------+--------------+ POP      Full           Yes      Yes                                 +---------+---------------+---------+-----------+----------+--------------+ PTV       Full                                                        +---------+---------------+---------+-----------+----------+--------------+ PERO     Full                                                        +---------+---------------+---------+-----------+----------+--------------+ Gastroc  Full                                                        +---------+---------------+---------+-----------+----------+--------------+   +---------+---------------+---------+-----------+----------+--------------+ LEFT     CompressibilityPhasicitySpontaneityPropertiesThrombus Aging +---------+---------------+---------+-----------+----------+--------------+ CFV      Full           Yes      Yes                                 +---------+---------------+---------+-----------+----------+--------------+ SFJ      Full                                                        +---------+---------------+---------+-----------+----------+--------------+ FV Prox  Full                                                        +---------+---------------+---------+-----------+----------+--------------+ FV Mid   Full           Yes      Yes                                 +---------+---------------+---------+-----------+----------+--------------+  FV DistalFull           Yes      Yes                                 +---------+---------------+---------+-----------+----------+--------------+ PFV      Full                                                        +---------+---------------+---------+-----------+----------+--------------+ POP      Full           Yes      Yes                                 +---------+---------------+---------+-----------+----------+--------------+ PTV      Full                                                        +---------+---------------+---------+-----------+----------+--------------+ PERO     Full                                                         +---------+---------------+---------+-----------+----------+--------------+     Summary: RIGHT: - There is no evidence of deep vein thrombosis in the lower extremity.  - No cystic structure found in the popliteal fossa.  LEFT: - There is no evidence of deep vein thrombosis in the lower extremity. However, portions of this examination were limited- see technologist comments above.  - No cystic structure found in the popliteal fossa. - Ultrasound characteristics of enlarged lymph nodes noted in the groin.  *See table(s) above for measurements and observations. Electronically signed by Irvin Mantel on 08/17/2023 at 2:27:31 PM.    Final    DG CHEST PORT 1 VIEW Result Date: 08/16/2023 CLINICAL DATA:  10026 Shortness of breath 10026 EXAM: PORTABLE CHEST 1 VIEW COMPARISON:  Chest x-ray 07/19/2022 FINDINGS: The heart and mediastinal contours are within normal limits. Low lung volumes. No focal consolidation. No pulmonary edema. No pleural effusion. No pneumothorax. No acute osseous abnormality. IMPRESSION: Low lung volumes with no active disease. Electronically Signed   By: Morgane  Naveau M.D.   On: 08/16/2023 20:11   US  EKG SITE RITE Result Date: 08/16/2023 If Site Rite image not attached, placement could not be confirmed due to current cardiac rhythm.  DG Foot 2 Views Left Result Date: 08/12/2023 CLINICAL DATA:  Postoperative evaluation. EXAM: LEFT FOOT - 2 VIEW COMPARISON:  08/10/2023. FINDINGS: Status post interval amputation of the fifth ray at the level of the proximal fifth metatarsal shaft. Antibiotic spacer beads in the resection bed with overlying wound VAC. Expected postoperative soft tissue swelling and air. Otherwise, no significant change. Remote healed third metatarsal fracture deformity. Hallux valgus deformity with severe degenerative changes of the first MTP joint. Mild-to-moderate degenerative changes of the midfoot. Vascular calcifications are noted. IMPRESSION: Status  post interval amputation of the fifth  ray at the level of the proximal fifth metatarsal shaft. No acute complication. Electronically Signed   By: Mannie Seek M.D.   On: 08/12/2023 19:12   MR FOOT LEFT W WO CONTRAST Result Date: 08/11/2023 CLINICAL DATA:  Left foot cellulitis for 2 weeks with increased swelling, erythema and discharge laterally. Findings suspicious for soft tissue infection and possible osteomyelitis on radiographs. EXAM: MRI OF THE LEFT FOREFOOT WITHOUT AND WITH CONTRAST TECHNIQUE: Multiplanar, multisequence MR imaging of the left forefoot was performed both before and after administration of intravenous contrast. CONTRAST:  10mL GADAVIST  GADOBUTROL  1 MMOL/ML IV SOLN COMPARISON:  Radiographs 08/10/2023 and 05/25/2023.  MRI 05/25/2021. FINDINGS: Technical note: Despite efforts by the technologist and patient, moderate motion artifact is present on today's exam and could not be eliminated. This reduces exam sensitivity and specificity. Bones/Joint/Cartilage There are marrow changes within the 5th metatarsal head and distal half of the 5th metatarsal shaft consistent with osteomyelitis. In particular, there is increased T2 and decreased T1 signal with cortical destruction along the medial metatarsal head and diffuse marrow enhancement following contrast. Probable adjacent osteomyelitis within the 5th proximal phalanx. No other evidence of osteomyelitis within the forefoot. Prominent degenerative changes again noted at the 1st metatarsal-phalangeal joint and throughout the midfoot. No significant joint effusions. Ligaments Grossly intact Lisfranc and metatarsal-phalangeal joint collateral ligaments, suboptimally evaluated due to motion. Muscles and Tendons Generalized fatty atrophy again noted throughout the forefoot musculature. No focal intramuscular fluid collection or significant tenosynovitis identified. Soft tissues Areas of apparent soft tissue ulceration plantar to the 5th  metatarsophalangeal joint and more proximally in the lateral forefoot. There are extensive underlying inflammatory changes throughout the adjacent soft tissues with heterogeneous enhancement following contrast. There are ill-defined, peripherally enhancing fluid collections wrapping around the plantar and lateral aspects the 5th metatarsal head, measuring up to 2.6 x 3.4 x 1.3 cm, suspicious for soft tissue abscess. Possible additional small superficial abscess dorsally, just deep to the skin wound, best seen on the sagittal postcontrast images. IMPRESSION: 1. Findings are consistent with osteomyelitis involving the 5th metatarsal head and distal half of the 5th metatarsal shaft. Probable adjacent osteomyelitis within the 5th proximal phalanx. 2. Extensive inflammatory changes throughout the adjacent soft tissues with ill-defined, peripherally enhancing fluid collections wrapping around the plantar and lateral aspects of the 5th metatarsal head, suspicious for soft tissue abscesses. Possible additional small superficial abscess dorsally, just deep to the skin wound. 3. No other evidence of osteomyelitis within the forefoot. 4. Prominent degenerative changes at the 1st metatarsal-phalangeal joint and throughout the midfoot, as before. Electronically Signed   By: Elmon Hagedorn M.D.   On: 08/11/2023 10:17   DG Foot Complete Left Result Date: 08/10/2023 CLINICAL DATA:  Worsening infection EXAM: LEFT FOOT - COMPLETE 3+ VIEW COMPARISON:  Right foot x-ray 05/25/2023 FINDINGS: There is soft tissue swelling and soft tissue air lateral to the fifth metatarsophalangeal joint which has increased compared to the prior study. There is marked soft tissue swelling of the fourth and fifth toes. No cortical erosive changes are identified, but there are some focal lucencies in the lateral aspect of the head of the fifth metatarsal, indeterminate. No acute fracture identified. There is a healed third metatarsal fracture. There are  moderate severe degenerative changes at the first metatarsophalangeal joint with hallux valgus. Peripheral vascular calcifications are noted. IMPRESSION: 1. Soft tissue swelling and soft tissue air at the fifth metatarsophalangeal joint compatible with infection. 2. Marked soft tissue swelling of the fourth and fifth toes,  worsened when compared to the prior study. 3. Focal lucencies in the lateral aspect of the head of the fifth metatarsal, indeterminate. Osteomyelitis not excluded. Consider MRI for further evaluation. Electronically Signed   By: Tyron Gallon M.D.   On: 08/10/2023 16:27    Microbiology: Recent Results (from the past 240 hours)  Blood culture (routine x 2)     Status: None   Collection Time: 08/10/23 12:26 PM   Specimen: BLOOD  Result Value Ref Range Status   Specimen Description   Final    BLOOD LEFT ANTECUBITAL Performed at [facility], [address] [place], [place] [address]    Special Requests   Final    BOTTLES DRAWN AEROBIC AND ANAEROBIC Blood Culture adequate volume Performed at [facility], [address] [place], [place] [address]    Culture   Final    NO GROWTH 5 DAYS Performed at [facility], [address] [place], [place] [address]    Report Status 08/15/2023 FINAL  Final  Blood culture (routine x 2)     Status: None   Collection Time: 08/10/23 12:31 PM   Specimen: BLOOD  Result Value Ref Range Status   Specimen Description   Final    BLOOD HAND RIGHT Performed at [facility], [address] [place], [place] [address]    Special Requests   Final    BOTTLES DRAWN AEROBIC AND ANAEROBIC Blood Culture adequate volume Performed at [facility], [address] [place], [place] [address]    Culture   Final    NO GROWTH 5 DAYS Performed at [facility], [address] [place], [place] [address]    Report Status 08/15/2023 FINAL  Final  Aerobic/Anaerobic Culture w Gram Stain (surgical/deep  wound)     Status: None (Preliminary result)   Collection Time: 08/12/23  2:56 PM   Specimen: Bone; Tissue  Result Value Ref Range Status   Specimen Description BONE LEFT FOOT  Final   Special Requests NONE  Final   Gram Stain   Final    RARE WBC PRESENT,BOTH PMN AND MONONUCLEAR RARE GRAM POSITIVE COCCI IN PAIRS IN CLUSTERS    Culture   Final    CULTURE REINCUBATED FOR BETTER GROWTH Performed at [facility], [address], [place], [place] [address]    Report Status PENDING  Incomplete  Aerobic/Anaerobic Culture w Gram Stain (surgical/deep wound)     Status: None (Preliminary result)   Collection Time: 08/12/23  2:58 PM   Specimen: Foot, Left; Tissue  Result Value Ref Range Status   Specimen Description TISSUE LEFT FOOT  Final   Special Requests NONE  Final   Gram Stain   Final    FEW WBC PRESENT, PREDOMINANTLY MONONUCLEAR RARE GRAM POSITIVE COCCI IN PAIRS    Culture   Final    RARE STAPHYLOCOCCUS SIMULANS RARE GROUP B STREP(S.AGALACTIAE)ISOLATED TESTING AGAINST S. AGALACTIAE NOT ROUTINELY PERFORMED DUE TO PREDICTABILITY OF AMP/PEN/VAN SUSCEPTIBILITY. HOLDING FOR POSSIBLE ANAEROBE Performed at [facility], [address] [place], [place] [address]    Report Status PENDING  Incomplete   Organism ID, Bacteria STAPHYLOCOCCUS SIMULANS  Final      Susceptibility   Staphylococcus simulans - MIC*    CIPROFLOXACIN <=0.5 SENSITIVE Sensitive     ERYTHROMYCIN RESISTANT Resistant     GENTAMICIN  <=0.5 SENSITIVE Sensitive     OXACILLIN <=0.25 SENSITIVE Sensitive  TETRACYCLINE <=1 SENSITIVE Sensitive     VANCOMYCIN  <=0.5 SENSITIVE Sensitive     TRIMETH/SULFA <=10 SENSITIVE Sensitive     CLINDAMYCIN RESISTANT Resistant     RIFAMPIN  <=0.5 SENSITIVE Sensitive     Inducible Clindamycin POSITIVE Resistant     * RARE STAPHYLOCOCCUS SIMULANS  Surgical PCR screen     Status: Abnormal   Collection Time: 08/13/23 11:25 PM   Specimen: Nasal Mucosa; Nasal Swab  Result Value  Ref Range Status   MRSA, PCR POSITIVE (A) NEGATIVE Final    Comment: RESULT CALLED TO, READ BACK BY AND VERIFIED WITH: S ALLENQUIST,RN@0158  08/14/23 MK    Staphylococcus aureus POSITIVE (A) NEGATIVE Final    Comment: (NOTE) The Xpert SA Assay (FDA approved for NASAL specimens in patients 56 years of age and older), is one component of a comprehensive surveillance program. It is not intended to diagnose infection nor to guide or monitor treatment. Performed at [facility], [address], [place], [place] [address]      Labs: Basic Metabolic Panel: Recent Labs  Lab 08/13/23 [phone] 08/14/23 0452 08/15/23 0654 08/16/23 0544 08/17/23 0716  NA 136 138 141 138 139  K 3.9 4.1 4.1 4.6 4.4  CL 97* 100 98 101 101  CO2 27 27 28 28 27   GLUCOSE 169* 138* 139* 150* 163*  BUN 28* 26* 28* 25* 19  CREATININE 1.65* 1.46* 1.64* 1.39* 1.17  CALCIUM  8.3* 8.6* 8.9 8.6* 9.0   Liver Function Tests: Recent Labs  Lab 08/12/23 0718  AST 13*  ALT 21  ALKPHOS 52  BILITOT 0.7  PROT 7.5  ALBUMIN 2.8*   No results for input(s): "LIPASE", "AMYLASE" in the last 168 hours. No results for input(s): "AMMONIA" in the last 168 hours. CBC: Recent Labs  Lab 08/13/23 0625 08/14/23 0452 08/15/23 0654 08/16/23 0544 08/17/23 0716  WBC 12.0* 12.3* 11.6* 13.6* 11.3*  NEUTROABS 7.7 8.2* 7.3 9.8* 7.8*  HGB 10.1* 9.5* 9.0* 9.7* 10.3*  HCT 32.2* 30.4* 28.6* 30.7* 31.7*  MCV 83.0 84.0 84.1 83.9 83.2  PLT 271 260 270 294 282   Cardiac Enzymes: Recent Labs  Lab 08/17/23 0716  CKTOTAL 26*   BNP: BNP (last 3 results) No results for input(s): "BNP" in the last 8760 hours.  ProBNP (last 3 results) No results for input(s): "PROBNP" in the last 8760 hours.  CBG: Recent Labs  Lab 08/16/23 1145 08/16/23 1626 08/16/23 2131 08/17/23 0612 08/17/23 1310  GLUCAP 155* 89 172* 177* 100*    Signed:  Verlie Glisson Justin Hall   [facility] 08/17/2023, 4:20 PM

## 2023-08-17 NOTE — Discharge Instructions (Signed)
 OPAT Orders Discharge antibiotics to be given via PICC line Discharge antibiotics: Daptomycin 800 mg IV q 24  Per pharmacy protocol    Duration: 4 weeks  End Date: 09/11/23   [facility] Care Per Protocol:   Home health RN for IV administration and teaching; PICC line care and labs.     Labs weekly while on IV antibiotics: _X_ CBC with differential __ BMP __ CMP _X_ CRP _X_ ESR __ Vancomycin  trough _X_ CK   __ Please pull PIC at completion of IV antibiotics _X_ Please leave PIC in place until doctor has seen patient or been notified   Fax weekly labs to [phone]   Clinic Follow Up Appt:   09/16/2023 at 9:15 am with Dr. Levern Reader

## 2023-08-18 ENCOUNTER — Telehealth: Payer: Self-pay

## 2023-08-18 ENCOUNTER — Telehealth: Payer: Self-pay | Admitting: *Deleted

## 2023-08-18 NOTE — Telephone Encounter (Signed)
 Called pt was advised and appt scheduled for tomorrow.

## 2023-08-18 NOTE — Transitions of Care (Post Inpatient/ED Visit) (Signed)
 08/18/2023  Name: Justin Hall MRN: [id number] DOB: May 06, 1965  Today's TOC FU Call Status: Today's TOC FU Call Status:: Successful TOC FU Call Completed TOC FU Call Complete Date: 08/18/23 Patient's Name and Date of Birth confirmed.  Transition Care Management Follow-up Telephone Call Date of Discharge: 09/16/23 Discharge Facility: [facility]) Type of Discharge: Inpatient Admission Primary Inpatient Discharge Diagnosis:: Diabetic foot ulcer How have you been since you were released from the hospital?: Better Any questions or concerns?: No  Items Reviewed: Did you receive and understand the discharge instructions provided?: Yes Medications obtained,verified, and reconciled?: Yes (Medications Reviewed) Any new allergies since your discharge?: No Dietary orders reviewed?: Yes Type of Diet Ordered:: low sodium heart healthy Do you have support at home?: Yes People in Home [RPT]: spouse Name of Support/Comfort Primary Source: Beth  Medications Reviewed Today: Medications Reviewed Today     Reviewed by Eilene Grater, RN (Case Manager) on 08/18/23 at 1011  Med List Status: <None>   Medication Order Taking? Sig Documenting Provider Last Dose Status Informant  acetaminophen  (TYLENOL ) 500 MG tablet [id number] Yes Take 2 tablets (1,000 mg total) by mouth every 6 (six) hours as needed for moderate pain (pain score 4-6). Samtani, Jai-Gurmukh, MD Taking Active   allopurinol  (ZYLOPRIM ) 300 MG tablet [id number] Yes Take 1 tablet (300 mg total) by mouth 2 (two) times daily. Jobe Mulder, DO Taking Active Self  amLODipine  (NORVASC ) 5 MG tablet [id number] Yes Take 1 tablet by mouth daily. Jobe Mulder, DO Taking Active Self  atorvastatin  (LIPITOR ) 80 MG tablet [id number] Yes Take 1 tablet by mouth daily. Jobe Mulder, DO Taking Active Self           Med Note Lugene Sahara, Bridgette Campus D   Fri Aug 14, 2023  2:39 PM) pm  blood glucose meter kit and supplies [id number]  No Dispense based on patient and insurance preference. Use up to four times daily as directed. (FOR ICD-10 E10.9, E11.9).  Patient not taking: Reported on 08/18/2023   Kraig Peru, MD Not Taking Active Self           Med Note Ancil Kast, Abelina Ketron H   Tue Aug 18, 2023 10:11 AM) Per patient he doesn't have a meter  carvedilol  (COREG ) 6.25 MG tablet [id number] Yes Take 1 tablet (6.25 mg total) by mouth 2 (two) times daily with a meal. Jobe Mulder, DO Taking Active Self  daptomycin (CUBICIN) IVPB [id number] Yes Inject 800 mg into the vein daily for 25 days. Indication:  DFI/osteomyelitis First Dose: Yes Last Day of Therapy:  09/11/23- Please leave PICC in place until doctor has seen pt or been notified  Labs - Once weekly:  CBC/D, BMP, and CPK Labs - Once weekly: ESR and CRP Method of administration: IV Push Method of administration may be changed at the discretion of home infusion pharmacist based upon assessment of the patient and/or caregiver's ability to self-administer the medication ordered. Samtani, Jai-Gurmukh, MD Taking Active   gabapentin  (NEURONTIN ) 300 MG capsule [id number] Yes TAKE 1 CAPSULE(300 MG) BY MOUTH THREE TIMES DAILY Gwenette Lennox Shellie Dials, DO Taking Active Self  gentamicin  ointment (GARAMYCIN ) 0.1 % [id number] No Apply 1 Application topically 3 (three) times daily.  Patient not taking: Reported on 08/14/2023   Charity Conch, DPM Not Taking Active Self  hydrOXYzine  (ATARAX ) 25 MG tablet [id number] Yes Take 1 tablet (25 mg total) by mouth 3 (three) times daily as needed for anxiety. Samtani, Jai-Gurmukh, MD Taking Active   metFORMIN  (  GLUCOPHAGE  XR) 500 MG 24 hr tablet [id number] Yes Take 2 tablets (1,000 mg total) by mouth in the morning and at bedtime. Jobe Mulder, DO Taking Active Self  methocarbamol  1000 MG TABS [id number] Yes Take 1,000 mg by mouth 2 (two) times daily. Samtani, Jai-Gurmukh, MD Taking Active   oxyCODONE  10 MG TABS [id number] Yes Take 1  tablet (10 mg total) by mouth every 3 (three) hours as needed (moderate-severe pain). Samtani, Jai-Gurmukh, MD Taking Active   rOPINIRole  (REQUIP ) 4 MG tablet [id number] Yes Take 4-8 mg by mouth 2 (two) times daily. 4 mg with breakfast and 8 mg at 6p [provider] Taking Active Self  sildenafil  (VIAGRA ) 100 MG tablet [id number]  Take 0.5-1 tablets (50-100 mg total) by mouth daily as needed for erectile dysfunction. Jobe Mulder, DO  Active Self           Med Note Lugene Sahara, JENNIFER D   Fri Aug 14, 2023  2:41 PM) Not started            Home Care and Equipment/Supplies: Were Home Health Services Ordered?: Yes Name of [facility] Health Agency:: [facility] Has Agency set up a time to come to your home?: Yes First Home Health Visit Date: 08/18/23 Any new equipment or medical supplies ordered?: No  Functional Questionnaire: Do you need assistance with bathing/showering or dressing?: No Do you need assistance with meal preparation?: Yes Do you need assistance with eating?: No Do you have difficulty maintaining continence: No Do you need assistance with getting out of bed/getting out of a chair/moving?: Yes Do you have difficulty managing or taking your medications?: No  Follow up appointments reviewed: PCP Follow-up appointment confirmed?: Yes Date of PCP follow-up appointment?: 08/25/23 Follow-up Provider: Dr Gwenette Lennox [facility] Follow-up appointment confirmed?: Yes Date of Specialist follow-up appointment?: 08/25/23 Follow-Up Specialty Provider:: [id number] Dr Authur Leghorn, [phone] Dr Levern Reader Do you need transportation to your follow-up appointment?: No Do you understand care options if your condition(s) worsen?: Yes-patient verbalized understanding  SDOH Interventions Today    Flowsheet Row Most Recent Value  SDOH Interventions   Food Insecurity Interventions Intervention Not Indicated  Housing Interventions Intervention Not Indicated  Transportation  Interventions Intervention Not Indicated, Patient Resources (Friends/Family)  Utilities Interventions Intervention Not Indicated        Goals Addressed             This Visit's Progress    VBCI Transitions of Care (TOC) Care Plan       Problems:  Recent Hospitalization for treatment of DMII and Diabetic ulcer  Medication access barrier Patient does not have a meter to check blood sugars  Goal:  Over the next 30 days, the patient will not experience hospital readmission  Interventions:  Transitions of Care: Doctor Visits  - discussed the importance of doctor visits Referral to Longitudinal Nurse Case Manager for Ongoing follow-up  Patient Self Care Activities:  Attend all scheduled provider appointments Call pharmacy for medication refills 3-7 days in advance of running out of medications Call provider office for new concerns or questions  Participate in Transition of Care Program/Attend TOC scheduled calls Take medications as prescribed    Plan:  An initial telephone outreach has been scheduled for:  Next PCP appointment scheduled for:  Telephone follow up appointment with care management team member scheduled for:  [phone] 11:00      PCP follow up appointment [phone] Patient has agreed to follow up outreach with Laine Tousey Care management Una Ganser BSN RN  [facility] Health Care Management Coordinator [email or web address] Direct Dial: [phone]  Fax: [phone] Website: [email or web address]

## 2023-08-18 NOTE — Telephone Encounter (Signed)
 Copied from CRM [id number]. Topic: Clinical - Prescription Issue >> Aug 17, 2023  5:14 PM Bridgette Campus T wrote: Reason for CRM: Patient would like to discuss medication changes, he is being discharged from the hospital and refused to schedule an appt for follow up until he speaks with Dr. Gwenette Lennox please call wife's phone number  [phone]   Please Advise Called pt and follow up appt scheduled. Pt stated that Was taking off of Lisinopril  and Furosemide . He wanted to know if he needs to go back on it?

## 2023-08-18 NOTE — Telephone Encounter (Signed)
 Can he come tomorrow at 1130? He should stay off of it until he comes in.

## 2023-08-19 ENCOUNTER — Ambulatory Visit (INDEPENDENT_AMBULATORY_CARE_PROVIDER_SITE_OTHER): Admitting: Family Medicine

## 2023-08-19 ENCOUNTER — Encounter: Payer: Self-pay | Admitting: Family Medicine

## 2023-08-19 VITALS — BP 144/86 | HR 81 | Temp 98.0°F | Resp 16 | Ht 69.0 in | Wt 324.0 lb

## 2023-08-19 DIAGNOSIS — I10 Essential (primary) hypertension: Secondary | ICD-10-CM | POA: Diagnosis not present

## 2023-08-19 DIAGNOSIS — G4700 Insomnia, unspecified: Secondary | ICD-10-CM

## 2023-08-19 DIAGNOSIS — G4733 Obstructive sleep apnea (adult) (pediatric): Secondary | ICD-10-CM

## 2023-08-19 LAB — AEROBIC/ANAEROBIC CULTURE W GRAM STAIN (SURGICAL/DEEP WOUND)

## 2023-08-19 MED ORDER — CLONAZEPAM 0.5 MG PO TABS: 0.5000 mg | ORAL_TABLET | Freq: Two times a day (BID) | ORAL | 1 refills | Status: DC | PRN

## 2023-08-19 MED ORDER — LISINOPRIL 20 MG PO TABS: 20.0000 mg | ORAL_TABLET | Freq: Every day | ORAL | 2 refills | Status: DC

## 2023-08-19 MED ORDER — FUROSEMIDE 40 MG PO TABS: 40.0000 mg | ORAL_TABLET | Freq: Two times a day (BID) | ORAL | 3 refills | Status: AC

## 2023-08-19 NOTE — Patient Instructions (Signed)
 Give us  2-3 business days to get the results of your labs back.   Check your blood pressures 2-3 times per week, alternating the time of day you check it. If it is high, considering waiting 1-2 minutes and rechecking. If it gets higher, your anxiety is likely creeping up and we should avoid rechecking.   Let us  know if you need anything.
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
## 2023-08-20 ENCOUNTER — Ambulatory Visit: Admitting: Podiatry

## 2023-08-20 LAB — AEROBIC/ANAEROBIC CULTURE W GRAM STAIN (SURGICAL/DEEP WOUND)

## 2023-08-22 ENCOUNTER — Encounter: Payer: Self-pay | Admitting: Family Medicine

## 2023-08-23 NOTE — Anesthesia Postprocedure Evaluation (Signed)
 Anesthesia Post Note  Patient: Justin Hall  Procedure(s) Performed: IRRIGATION AND DEBRIDEMENT FOOT (Left) APPLICATION, WOUND VAC (Left) APPLICATION, ALLOGRAFT, SKIN (Left)     Patient location during evaluation: PACU Anesthesia Type: General Level of consciousness: awake and alert Pain management: pain level controlled Vital Signs Assessment: post-procedure vital signs reviewed and stable Respiratory status: spontaneous breathing, nonlabored ventilation, respiratory function stable and patient connected to nasal cannula oxygen Cardiovascular status: blood pressure returned to baseline and stable Postop Assessment: no apparent nausea or vomiting Anesthetic complications: no   No notable events documented.  Last Vitals:  Vitals:   08/17/23 1812 08/17/23 1921  BP: (!) 194/101 (!) 188/97  Pulse:    Resp:    Temp:    SpO2:      Last Pain:  Vitals:   08/17/23 1825  TempSrc:   PainSc: 7                  Elena Cothern L Kanisha Duba

## 2023-08-24 ENCOUNTER — Other Ambulatory Visit: Payer: Self-pay | Admitting: Family Medicine

## 2023-08-24 MED ORDER — ONDANSETRON 4 MG PO TBDP: 4.0000 mg | ORAL_TABLET | Freq: Three times a day (TID) | ORAL | 0 refills | Status: DC | PRN

## 2023-08-25 ENCOUNTER — Encounter: Payer: Self-pay | Admitting: *Deleted

## 2023-08-25 ENCOUNTER — Telehealth: Payer: Self-pay | Admitting: *Deleted

## 2023-08-25 ENCOUNTER — Inpatient Hospital Stay: Admitting: Family Medicine

## 2023-08-25 ENCOUNTER — Ambulatory Visit (INDEPENDENT_AMBULATORY_CARE_PROVIDER_SITE_OTHER): Admitting: Podiatry

## 2023-08-25 DIAGNOSIS — Z9889 Other specified postprocedural states: Secondary | ICD-10-CM

## 2023-08-25 DIAGNOSIS — L97522 Non-pressure chronic ulcer of other part of left foot with fat layer exposed: Secondary | ICD-10-CM

## 2023-08-25 DIAGNOSIS — M86172 Other acute osteomyelitis, left ankle and foot: Secondary | ICD-10-CM

## 2023-08-25 NOTE — Progress Notes (Signed)
 Subjective:  Patient ID: Justin Hall, male    DOB: May 26, 1965,  MRN: [id number]  Chief Complaint  Patient presents with   Post-op Follow-up    Doing good. Mild pain. Has wound vac and port in place. Wound care nurse reports minimal drainage.     DOS: 08/12/2023 Procedure: 1.  Partial fifth ray amputation, left foot 2.  Application antibiotic dissolvable beads, left foot 3.  Application dermal allograft 38 cm, left foot  DOS 08/14/23 1.  Repeat irrigation and debridement of surgical site with prep for graft, 5.5 x 5 cm, left foot 2.  Application dermal allograft 5 x 5 cm, left foot 3.  Application negative pressure wound therapy 5.5 x 5 cm, left foot  58 y.o. male seen for post op check.  Patient reports he is doing well.  Wound care nurse at home changed wound VAC dressing yesterday.  Says it was looking good at the time per the wound care nurse.  Has been walking with cam boot.  Has PICC line and getting IV antibiotics per ID Recs.  He says he is feeling much better than prior, no concerns.  Review of Systems: Negative except as noted in the HPI. Denies N/V/F/Ch.   Objective:   There were no vitals filed for this visit.  There is no height or weight on file to calculate BMI. Constitutional Well developed. Well nourished.  Vascular Foot warm and well perfused. Capillary refill normal to all digits.   No calf pain with palpation  Neurologic Normal speech. Oriented to person, place, and time. Epicritic sensation grossly intact to the left lower extremity  Dermatologic No evidence of proximal erythema to the wound wound VAC is intact and functioning with minimal drainage in canister.   Orthopedic: Status post partial fifth ray amputation left foot with large soft tissue deficit   Radiographs: Status post interval amputation fifth ray at the level of the proximal fifth met shaft with antibiotic beads placed  Pathology: A. TOE, LEFT FIFTH, AMPUTATION:       Chronic  osteomyelitits.       Subcutaneous tissue with acute inflammation, ulceration and abscess  formation.       Resection margin is viable without necrosis or acute  osteomyelitis.   B. BONE, LEFT FIFTH METATARSAL PROXIMAL, MARGIN:       Bone with reactive changes.       Negative for osteomyelitits.    Micro:  STAPHYLOCOCCUS SIMULANS , GROUP B STREP(S.AGALACTIAE)ISOLATED   Assessment:   Osteomyelitis of fifth ray status post partial fifth ray amputation, now s/p repeat debridement, integra and vac application.  Plan:  Patient was evaluated and treated and all questions answered.  2 weeks s/p repeat debridement, Integra graft, vac application of left foot partial fifth ray amputation site -Appears to be doing well with vac therapy. Will leave intact - Next change planned for Friday with [facility] RN.  Was changed yesterday and I do not wish to disturb the VAC therapy until Friday. -Will continue plans for 2x weekly vac change per [facility] RN. -XR: Expected postop changes -WB Status: Try to maintain NWB as much as possible, ok for short distances in CAM boot which he has bedside, says he feels stable with that -Sutures: None present -Medications/ABX: Abx per ID, PICC placed, plan 4 weeks abx, has follow-up with ID later this month -   Continue with current wound VAC therapy until next appointment client will take the staples and silicone layer off the graft. - Follow-up in  2 weeks         Justin Hall, DPM [facility] / [facility]

## 2023-08-26 ENCOUNTER — Encounter: Payer: Self-pay | Admitting: *Deleted

## 2023-08-27 ENCOUNTER — Encounter: Payer: Self-pay | Admitting: *Deleted

## 2023-09-07 NOTE — Progress Notes (Signed)
 1//31/23- Justin Hall Was diagnosed with sleep apnea about 10 years ago  Has been using his machine for about 10 years Still uses it nightly, feels he benefits from using it Wakes up feeling like is at a decent nights rest  Usually goes to bed between 11 and 1 AM, falls asleep in 5 minutes or less 3-4 awakenings Final wake up time about 7 AM  Weight is down currently about 40 pounds  Has had some health issues with his hip requiring surgery and had to have a redo Hypertension is well controlled History of heart failure-optimizing treatment History of diabetes, hypercholesterolemia  Never smoker  No family history of obstructive sleep apnea known to  Admits to dryness of his mouth on a regular basis No morning headaches Memory is good  Feels he sleeps well with his CPAP at present  Machine is becoming dysfunctional, he does not remember what pressure settings he is on at present  09/08/23- 58 yoM followed for OSA, complicated by CAD, CHF, DM2, CKD, Obesity, Referred back by Justin Justin Hall with concerns of insomnia Body weight today-295 lbs CPAP- no longer using. He is told of witnessed apneas an snoring. Sleeps in a chair, without CPAP. Has clonazepam  but not using it Discussed the use of AI scribe software for clinical note transcription with the patient, who gave verbal consent to proceed.  History of Present Illness   Justin Hall is a 58 year old male with sleep apnea who presents with insomnia and CPAP machine issues. He was referred by Justin. Justin Hall for evaluation of insomnia and sleep apnea management.  Justin Hall experiences insomnia for six months, linked to a non-functional CPAP machine. He sleeps sitting up in a chair, waking frequently, with sleep lasting only thirty minutes at a time. In the hospital, CPAP use resulted in restful sleep. His CPAP machine malfunction prevents its use, and he has not started Klonopin  until the machine is functional. His wife observes  snoring and apneic episodes during sleep.   He currently has a PICC line in R arm, wound vac and ortho boot on L lower leg after stepping on a screw, leading to partial amputation L foot. Diabetic.     Assessment and Plan:    Obstructive sleep apnea Obstructive sleep apnea with non-functional CPAP causing disturbances. Improved with CPAP during hospitalization. Home sleep test planned to assess status and facilitate CPAP setup. - Order home sleep test. - Coordinate insurance approval for test. - Arrange CPAP setup based on test results. - Advise sleep test on typical night, avoid unusual activities.  Insomnia related to sleep apnea Insomnia likely secondary to untreated sleep apnea. Klonopin  not initiated due to respiratory depression risk without CPAP. - Reassess insomnia management post-CPAP setup. - Advise against Klonopin  until CPAP resumed.  Septic hip and foot surgery Postoperative status post-hip surgery with sepsis. Receiving antibiotics via PICC, 21 of 25 doses completed. Wound vac in place. - Continue antibiotics via PICC for 4 more days. - Maintain wound vac.     ROS-see HPI   + = positive Constitutional:    weight loss, night sweats, fevers, chills, fatigue, lassitude. HEENT:    headaches, difficulty swallowing, tooth/dental problems, sore throat,       sneezing, itching, ear ache, nasal congestion, post nasal drip, snoring CV:    chest pain, orthopnea, PND, swelling in lower extremities, anasarca,  dizziness, palpitations Resp:   shortness of breath with exertion or at rest.                productive cough,   non-productive cough, coughing up of blood.              change in color of mucus.  wheezing.   Skin:    rash or lesions. GI:  No-   heartburn, indigestion, abdominal pain, nausea, vomiting, diarrhea,                 change in bowel habits, loss of appetite GU: dysuria, change in color of urine, no urgency or frequency.   flank  pain. MS:   joint pain, stiffness, decreased range of motion, back pain. Neuro-     nothing unusual Psych:  change in mood or affect.  depression or anxiety.   memory loss.  OBJ- Physical Exam General- Alert, Oriented, Affect-appropriate, Distress- none acute, +obese Skin- rash-none, lesions- none, excoriation- none Lymphadenopathy- none Head- atraumatic            Eyes- Gross vision intact, PERRLA, conjunctivae and secretions clear            Ears- Hearing, canals-normal            Nose- Clear, no-Septal dev, mucus, polyps, erosion, perforation             Throat- Mallampati II , mucosa clear , drainage- none, tonsils- atrophic Neck- flexible , trachea midline, no stridor , thyroid nl, carotid no bruit Chest - symmetrical excursion , unlabored           Heart/CV- RRR , no murmur , no gallop  , no rub, nl s1 s2                           - JVD- none , edema- none, stasis changes- none, varices- none           Lung- clear to P&A, wheeze- none, cough- none , dullness-none, rub- none           Chest wall- +R PICC line Abd-  Br/ Gen/ Rectal- Not done, not indicated Extrem-+wound vac and Ortho boot L foot Neuro- grossly intact to observation

## 2023-09-08 ENCOUNTER — Ambulatory Visit: Admitting: Internal Medicine

## 2023-09-08 ENCOUNTER — Encounter: Payer: Self-pay | Admitting: Internal Medicine

## 2023-09-08 ENCOUNTER — Ambulatory Visit (INDEPENDENT_AMBULATORY_CARE_PROVIDER_SITE_OTHER): Admitting: Podiatry

## 2023-09-08 VITALS — BP 140/90 | HR 81 | Ht 69.0 in | Wt 295.0 lb

## 2023-09-08 DIAGNOSIS — L97522 Non-pressure chronic ulcer of other part of left foot with fat layer exposed: Secondary | ICD-10-CM

## 2023-09-08 DIAGNOSIS — G4700 Insomnia, unspecified: Secondary | ICD-10-CM | POA: Diagnosis not present

## 2023-09-08 DIAGNOSIS — Z9889 Other specified postprocedural states: Secondary | ICD-10-CM | POA: Diagnosis not present

## 2023-09-08 DIAGNOSIS — L97509 Non-pressure chronic ulcer of other part of unspecified foot with unspecified severity: Secondary | ICD-10-CM | POA: Diagnosis not present

## 2023-09-08 DIAGNOSIS — E11621 Type 2 diabetes mellitus with foot ulcer: Secondary | ICD-10-CM | POA: Diagnosis not present

## 2023-09-08 DIAGNOSIS — Z89432 Acquired absence of left foot: Secondary | ICD-10-CM

## 2023-09-08 DIAGNOSIS — G4733 Obstructive sleep apnea (adult) (pediatric): Secondary | ICD-10-CM

## 2023-09-08 DIAGNOSIS — M86172 Other acute osteomyelitis, left ankle and foot: Secondary | ICD-10-CM

## 2023-09-08 DIAGNOSIS — L89209 Pressure ulcer of unspecified hip, unspecified stage: Secondary | ICD-10-CM

## 2023-09-08 DIAGNOSIS — Z7985 Long-term (current) use of injectable non-insulin antidiabetic drugs: Secondary | ICD-10-CM

## 2023-09-08 NOTE — Progress Notes (Signed)
 Subjective:  Patient ID: Justin Hall, male    DOB: Oct 10, 1965,  MRN: [id number]  Chief Complaint  Patient presents with   Post-op Follow-up    Denies pain, denies discomfort.     DOS: 08/12/2023 Procedure: 1.  Partial fifth ray amputation, left foot 2.  Application antibiotic dissolvable beads, left foot 3.  Application dermal allograft 38 cm, left foot  DOS 08/14/23 1.  Repeat irrigation and debridement of surgical site with prep for graft, 5.5 x 5 cm, left foot 2.  Application dermal allograft 5 x 5 cm, left foot 3.  Application negative pressure wound therapy 5.5 x 5 cm, left foot  58 y.o. male seen for post op check.   Patient reports he is doing well he has been getting wound VAC changes per home health twice weekly.  Denies any nausea vomiting fever chills overall feeling well denies pain walking in cam boot hoping to get a postop shoe however.  Review of Systems: Negative except as noted in the HPI. Denies N/V/F/Ch.   Objective:   There were no vitals filed for this visit.  There is no height or weight on file to calculate BMI. Constitutional Well developed. Well nourished.  Vascular Foot warm and well perfused. Capillary refill normal to all digits.   No calf pain with palpation  Neurologic Normal speech. Oriented to person, place, and time. Epicritic sensation grossly intact to the left lower extremity  Dermatologic No evidence of proximal erythema to the wound, wound bed with very significant improvement from prior with healthy granular tissue and nicely incorporated Integra graft. Wound measures approximately 5 x 6 cm postdebridement   Orthopedic: Status post partial fifth ray amputation left foot with large soft tissue deficit   Radiographs: Status post interval amputation fifth ray at the level of the proximal fifth met shaft with antibiotic beads placed  Pathology: A. TOE, LEFT FIFTH, AMPUTATION:       Chronic osteomyelitits.       Subcutaneous tissue with  acute inflammation, ulceration and abscess  formation.       Resection margin is viable without necrosis or acute  osteomyelitis.   B. BONE, LEFT FIFTH METATARSAL PROXIMAL, MARGIN:       Bone with reactive changes.       Negative for osteomyelitits.    Micro:  STAPHYLOCOCCUS SIMULANS , GROUP B STREP(S.AGALACTIAE)ISOLATED   Assessment:   Osteomyelitis of fifth ray status post partial fifth ray amputation, now s/p repeat debridement, integra and vac application.  Plan:  Patient was evaluated and treated and all questions answered.  4 weeks s/p repeat debridement, Integra graft, vac application of left foot partial fifth ray amputation site -Appears to be doing well with vac therapy.  VAC was removed today.  Removed silicone layer and staples and Integra is well incorporated with good healing and granular tissue in wound bed. -Wound was debrided and applied a Aquacel Ag 4 x 4 gauze ABD pad Kerlix Ace wrap dressing.  Leave this dressing intact until Friday when the wound VAC should go back on -Will continue plans for 2x weekly vac change per [facility] RN. -XR: Expected postop changes -WB Status: Weightbearing as tolerated in postop shoe which was dispensed to him today. -Sutures: Removed staples holding Integra silicone layer -Medications/ABX: Abx per ID, PICC placed, follow-up with ID later this month -   Continue with current wound VAC therapy starting this Friday until next appointment and hopefully will then be ready for outpatient OR debridement and amniotic graft  application. - Follow-up in 2 weeks         Maridee Shoemaker, DPM [facility] / [facility]

## 2023-09-08 NOTE — Patient Instructions (Signed)
 Order- schedule home sleep test   dx OSA  Please call us about 2 weeks after your sleep test for results and recommendations

## 2023-09-11 ENCOUNTER — Telehealth: Payer: Self-pay

## 2023-09-11 MED ORDER — CEFADROXIL 500 MG PO CAPS
1000.0000 mg | ORAL_CAPSULE | Freq: Two times a day (BID) | ORAL | 0 refills | Status: DC
Start: 2023-09-11 — End: 2023-10-11

## 2023-09-11 NOTE — Telephone Encounter (Signed)
 Received call today from [facility] with [employer] requesting pull picc orders. States patient finished IV antibiotics today and would like to know if picc can be removed before memorial day weekend. Per opat leave picc in until appt on 5/28. Per Dr. Levern Reader okay to pull picc today and will need to send in send in cefadroxil 1000mg  bid x 30 days. Patient would like this to go to [facility] in [place].  Verbal orders relayed to RN .  Julien Odor, RMA

## 2023-09-11 NOTE — Telephone Encounter (Signed)
Thank you for coordinating.

## 2023-09-16 ENCOUNTER — Ambulatory Visit (INDEPENDENT_AMBULATORY_CARE_PROVIDER_SITE_OTHER): Admitting: Internal Medicine

## 2023-09-16 ENCOUNTER — Other Ambulatory Visit: Payer: Self-pay

## 2023-09-16 ENCOUNTER — Ambulatory Visit: Admitting: Family Medicine

## 2023-09-16 VITALS — BP 150/97 | HR 95 | Temp 97.3°F

## 2023-09-16 DIAGNOSIS — L97509 Non-pressure chronic ulcer of other part of unspecified foot with unspecified severity: Secondary | ICD-10-CM

## 2023-09-16 DIAGNOSIS — Z7984 Long term (current) use of oral hypoglycemic drugs: Secondary | ICD-10-CM

## 2023-09-16 DIAGNOSIS — M86172 Other acute osteomyelitis, left ankle and foot: Secondary | ICD-10-CM

## 2023-09-16 DIAGNOSIS — E11621 Type 2 diabetes mellitus with foot ulcer: Secondary | ICD-10-CM

## 2023-09-16 DIAGNOSIS — Z79899 Other long term (current) drug therapy: Secondary | ICD-10-CM

## 2023-09-16 MED ORDER — CEFADROXIL 500 MG PO CAPS: 1000.0000 mg | ORAL_CAPSULE | Freq: Two times a day (BID) | ORAL | 0 refills | Status: AC

## 2023-09-16 NOTE — Progress Notes (Signed)
 Patient ID: Justin Hall, male   DOB: Jul 17, 1965, 58 y.o.   MRN: [id number]  HPI 58yo M with Diabetic foot ulcer / osteomyelitis of left foot and he underwent (on 4/25)   Repeat irrigation and debridement of surgical site with prep for graft, 5.5 x 5 cm, left foot 2.  Application dermal allograft 5 x 5 cm, left foot 3.  Application negative pressure wound therapy 5.5 x 5 cm, left foot. OR Culture Result: Staphylococcus simulans / Group B Streptococcus / previous MRSA. He was discharged on 4 weeks of daptomycin  for which he took through 5/23, and then switched to cefadroxil. Still has wound vac in place. He follows with dr standiford next week. He reports staples are out but may need addn graft. He continues to heal well.  Lab work from 5/12 shows sed rate of  88  crp 14. Cr at 1.3  Picc line removed on 5/27 -mild abrasion to skin for adhesive dressing. Also slipped in the rain - small abrasion to left knee. No surrounding erythema  Outpatient Encounter Medications as of 09/16/2023  Medication Sig   acetaminophen  (TYLENOL ) 500 MG tablet Take 2 tablets (1,000 mg total) by mouth every 6 (six) hours as needed for moderate pain (pain score 4-6).   allopurinol  (ZYLOPRIM ) 300 MG tablet Take 1 tablet (300 mg total) by mouth 2 (two) times daily.   amLODipine  (NORVASC ) 5 MG tablet Take 1 tablet by mouth daily.   atorvastatin  (LIPITOR ) 80 MG tablet Take 1 tablet by mouth daily.   blood glucose meter kit and supplies Dispense based on patient and insurance preference. Use up to four times daily as directed. (FOR ICD-10 E10.9, E11.9).   carvedilol  (COREG ) 6.25 MG tablet Take 1 tablet (6.25 mg total) by mouth 2 (two) times daily with a meal.   cefadroxil (DURICEF) 500 MG capsule Take 2 capsules (1,000 mg total) by mouth 2 (two) times daily.   clonazePAM  (KLONOPIN ) 0.5 MG tablet Take 1 tablet (0.5 mg total) by mouth 2 (two) times daily as needed for anxiety.   furosemide  (LASIX ) 40 MG tablet Take 1  tablet (40 mg total) by mouth 2 (two) times daily.   gabapentin  (NEURONTIN ) 300 MG capsule TAKE 1 CAPSULE(300 MG) BY MOUTH THREE TIMES DAILY   gentamicin  ointment (GARAMYCIN ) 0.1 % Apply 1 Application topically 3 (three) times daily.   lisinopril  (ZESTRIL ) 20 MG tablet Take 1 tablet (20 mg total) by mouth daily.   metFORMIN  (GLUCOPHAGE  XR) 500 MG 24 hr tablet Take 2 tablets (1,000 mg total) by mouth in the morning and at bedtime.   methocarbamol  1000 MG TABS Take 1,000 mg by mouth 2 (two) times daily.   ondansetron  (ZOFRAN -ODT) 4 MG disintegrating tablet Take 1 tablet (4 mg total) by mouth every 8 (eight) hours as needed for nausea or vomiting.   oxyCODONE  10 MG TABS Take 1 tablet (10 mg total) by mouth every 3 (three) hours as needed (moderate-severe pain).   rOPINIRole  (REQUIP ) 4 MG tablet Take 4-8 mg by mouth 2 (two) times daily. 4 mg with breakfast and 8 mg at 6p   sildenafil  (VIAGRA ) 100 MG tablet Take 0.5-1 tablets (50-100 mg total) by mouth daily as needed for erectile dysfunction.   No facility-administered encounter medications on file as of 09/16/2023.     Patient Active Problem List   Diagnosis Date Noted   Pyogenic inflammation of bone ([name]) 08/11/2023   Diabetic foot ulcer ([name]) 08/10/2023   CKD (chronic kidney disease) stage  1, GFR 90 ml/min or greater 08/10/2023   Dyslipidemia 08/10/2023   Pain 08/10/2023   Mixed hyperlipidemia 05/15/2023   Medication monitoring encounter 03/22/2020   Postop check    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) 02/23/2020   OA (osteoarthritis) of hip 01/23/2020   Body mass index 40.0-44.9, adult ([name]) 03/08/2019   Uncontrolled type 2 diabetes mellitus with hyperglycemia ([name]) 01/28/2019   Primary osteoarthritis of left hip 01/28/2019   Diastolic dysfunction    Acute exacerbation of CHF (congestive heart failure) ([name]) 10/17/2018   Coronary artery disease 10/17/2018   Gout flare 05/04/2018   Psychophysiological insomnia 01/25/2018    Bunion of great toe 11/18/2017   Morbid obesity ([name]) 08/12/2017   Essential hypertension 08/12/2017   Chronic gout without tophus 08/12/2017   Chest pain 06/01/2017   Hypertensive urgency 06/01/2017   DM2 (diabetes mellitus, type 2) ([name]) 06/01/2017   S/P laparoscopic hernia repair 02/27/2014   Restless leg syndrome      Health Maintenance Due  Topic Date Due   Medicare Annual Wellness (AWV)  Never done   DTaP/Tdap/Td (1 - Tdap) Never done   Zoster Vaccines- Shingrix (1 of 2) Never done   Colonoscopy  Never done     Review of Systems Other than abrasion to left knee, mild nausea with first dose of cefadroxil otherwise 12 point ros is negative Physical Exam   BP (!) 150/97   Pulse 95   Temp (!) 97.3 F (36.3 C) (Temporal)   SpO2 95%   Physical Exam  Constitutional: He is oriented to person, place, and time. He appears well-developed and well-nourished. No distress.  HENT:  Mouth/Throat: Oropharynx is clear and moist. No oropharyngeal exudate.  Ext: left leg wound vac Neurological: He is alert and oriented to person, place, and time.  Skin: Skin is warm and dry. No rash noted. No erythema. Small abrasion to left patella from recent fall no surrounding cellulitis Psychiatric: He has a normal mood and affect. His behavior is normal.    CBC Lab Results  Component Value Date   WBC 11.3 (H) 08/17/2023   RBC 3.81 (L) 08/17/2023   HGB 10.3 (L) 08/17/2023   HCT 31.7 (L) 08/17/2023   PLT 282 08/17/2023   MCV 83.2 08/17/2023   MCH 27.0 08/17/2023   MCHC 32.5 08/17/2023   RDW 15.2 08/17/2023   LYMPHSABS 2.0 08/17/2023   MONOABS 0.8 08/17/2023   EOSABS 0.5 08/17/2023    BMET Lab Results  Component Value Date   NA 139 08/17/2023   K 4.4 08/17/2023   CL 101 08/17/2023   CO2 27 08/17/2023   GLUCOSE 163 (H) 08/17/2023   BUN 19 08/17/2023   CREATININE 1.17 08/17/2023   CALCIUM  9.0 08/17/2023   GFRNONAA >60 08/17/2023   GFRAA >60 01/24/2020      Assessment and  Plan Nausea with abtx = recommend to take meds on full stomach  Diabetic foot osteomyelitis of left foot = Will check sed rate and crp to see if some improvement since 2 wk ago Plan to see back in 4-6 wk continue on cefadroxil until then. Will give refills  Long term medication management = will check cr  I have personally spent 30 minutes involved in face-to-face and non-face-to-face activities for this patient on the day of the visit. Professional time spent includes the following activities: Preparing to see the patient (review of tests), Obtaining and/or reviewing separately obtained history (admission/discharge record), Performing a medically appropriate examination and/or evaluation ,  Ordering medications/tests/procedures, Documenting clinical information in the EMR, Independently interpreting results (not separately reported), Communicating results to the patient.

## 2023-09-17 LAB — BASIC METABOLIC PANEL WITH GFR
BUN/Creatinine Ratio: 18 (calc) (ref 6–22)
BUN: 35 mg/dL — ABNORMAL HIGH (ref 7–25)
CO2: 30 mmol/L (ref 20–32)
Calcium: 8.6 mg/dL (ref 8.6–10.3)
Chloride: 100 mmol/L (ref 98–110)
Creat: 1.92 mg/dL — ABNORMAL HIGH (ref 0.70–1.35)
Glucose, Bld: 105 mg/dL — ABNORMAL HIGH (ref 65–99)
Potassium: 4.1 mmol/L (ref 3.5–5.3)
Sodium: 140 mmol/L (ref 135–146)
eGFR: 39 mL/min/{1.73_m2} — ABNORMAL LOW (ref >=60)

## 2023-09-17 LAB — C-REACTIVE PROTEIN: CRP: 30 mg/L — ABNORMAL HIGH (ref <8.0)

## 2023-09-17 LAB — SEDIMENTATION RATE: Sed Rate: 43 mm/h — ABNORMAL HIGH (ref 0–20)

## 2023-09-18 ENCOUNTER — Ambulatory Visit: Admitting: Family Medicine

## 2023-09-18 ENCOUNTER — Encounter: Payer: Self-pay | Admitting: Family Medicine

## 2023-09-18 VITALS — BP 136/84 | HR 91 | Temp 98.0°F | Resp 16 | Ht 69.0 in | Wt 306.0 lb

## 2023-09-18 DIAGNOSIS — I10 Essential (primary) hypertension: Secondary | ICD-10-CM

## 2023-09-18 DIAGNOSIS — E1165 Type 2 diabetes mellitus with hyperglycemia: Secondary | ICD-10-CM | POA: Diagnosis not present

## 2023-09-18 DIAGNOSIS — Z7985 Long-term (current) use of injectable non-insulin antidiabetic drugs: Secondary | ICD-10-CM

## 2023-09-18 MED ORDER — TIRZEPATIDE 7.5 MG/0.5ML ~~LOC~~ SOAJ
7.5000 mg | SUBCUTANEOUS | 0 refills | Status: AC
Start: 2023-10-16 — End: 2023-11-13

## 2023-09-18 MED ORDER — TIRZEPATIDE 12.5 MG/0.5ML ~~LOC~~ SOAJ: 12.5000 mg | SUBCUTANEOUS | 0 refills | Status: AC

## 2023-09-18 MED ORDER — TIRZEPATIDE 15 MG/0.5ML ~~LOC~~ SOAJ: 15.0000 mg | SUBCUTANEOUS | 1 refills | Status: DC

## 2023-09-18 MED ORDER — TIRZEPATIDE 5 MG/0.5ML ~~LOC~~ SOAJ
5.0000 mg | SUBCUTANEOUS | 0 refills | Status: AC
Start: 2023-09-18 — End: 2023-10-16

## 2023-09-18 MED ORDER — TIRZEPATIDE 10 MG/0.5ML ~~LOC~~ SOAJ: 10.0000 mg | SUBCUTANEOUS | 0 refills | Status: AC
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
## 2023-09-18 NOTE — Patient Instructions (Signed)
Keep the diet clean and stay active.  Check your blood pressures 2-3 times per week, alternating the time of day you check it. If it is high, considering waiting 1-2 minutes and rechecking. If it gets higher, your anxiety is likely creeping up and we should avoid rechecking.   Let us know if you need anything.  

## 2023-09-22 ENCOUNTER — Ambulatory Visit (INDEPENDENT_AMBULATORY_CARE_PROVIDER_SITE_OTHER): Admitting: Podiatry

## 2023-09-22 DIAGNOSIS — M86172 Other acute osteomyelitis, left ankle and foot: Secondary | ICD-10-CM

## 2023-09-22 DIAGNOSIS — Z9889 Other specified postprocedural states: Secondary | ICD-10-CM

## 2023-09-22 NOTE — Progress Notes (Signed)
 Subjective:  Patient ID: Justin Hall, male    DOB: [birth year],  MRN: [id number]  Chief Complaint  Patient presents with   Post-op Follow-up    Doing well since last visit. Wound vac last changed 09/21/23. Some leakage present. No excessive drainage. No pain. PICC line removed about a week ago.     DOS: 08/12/2023 Procedure: 1.  Partial fifth ray amputation, left foot 2.  Application antibiotic dissolvable beads, left foot 3.  Application dermal allograft 38 cm, left foot  DOS 08/14/23 1.  Repeat irrigation and debridement of surgical site with prep for graft, 5.5 x 5 cm, left foot 2.  Application dermal allograft 5 x 5 cm, left foot 3.  Application negative pressure wound therapy 5.5 x 5 cm, left foot  58 y.o. male seen for post op check.   Patient reports he is doing well he has been getting wound VAC changes per home health twice weekly.   Denies much if any drainage from the wound area in vac canister. Denies pain. Walking in post op shoe.   Review of Systems: Negative except as noted in the HPI. Denies N/V/F/Ch.   Objective:   There were no vitals filed for this visit.  There is no height or weight on file to calculate BMI. Constitutional Well developed. Well nourished.  Vascular Foot warm and well perfused. Capillary refill normal to all digits.   No calf pain with palpation  Neurologic Normal speech. Oriented to person, place, and time. Epicritic sensation grossly intact to the left lower extremity  Dermatologic No evidence of proximal erythema to the wound, wound bed with very significant improvement from prior with healthy granular tissue over the entire wound bed with good infll Wound measures approximately 5 x 6 cm postdebridement   Orthopedic: Status post partial fifth ray amputation left foot with large soft tissue deficit   Radiographs: Status post interval amputation fifth ray at the level of the proximal fifth met shaft with antibiotic beads  placed  Pathology: A. TOE, LEFT FIFTH, AMPUTATION:       Chronic osteomyelitits.       Subcutaneous tissue with acute inflammation, ulceration and abscess  formation.       Resection margin is viable without necrosis or acute  osteomyelitis.   B. BONE, LEFT FIFTH METATARSAL PROXIMAL, MARGIN:       Bone with reactive changes.       Negative for osteomyelitits.    Micro:  STAPHYLOCOCCUS SIMULANS , GROUP B STREP(S.AGALACTIAE)ISOLATED   Assessment:   Osteomyelitis of fifth ray status post partial fifth ray amputation, now s/p repeat debridement, integra and vac application.  Plan:  Patient was evaluated and treated and all questions answered.  6 weeks s/p repeat debridement, Integra graft, vac application of left foot partial fifth ray amputation site -  Continues to improve with vac therapy. He is now ready for OR debridement and amniotic graft application to expedite and promote further wound healing. Discussed risks benefits and alternatives and answered all questions. He wishes to proceed. Consent obtained. Will begin surgical planning.  -Wound was debrided and applied a Aquacel Ag 4 x 4 gauze ABD pad Kerlix Ace wrap dressing.  Leave this dressing intact until Friday when the wound VAC should go back on -Will continue plans for 2x weekly vac change per [facility] RN until OR -XR: Expected postop changes -WB Status: Weightbearing as tolerated in postop shoe  -Medications/ABX: Abx per ID, now just on PO abx, no concerns for residual infection -  Follow up after surgery as above        Maridee Shoemaker, DPM [facility] / [facility]

## 2023-09-23 ENCOUNTER — Telehealth: Payer: Self-pay | Admitting: Podiatry

## 2023-09-23 NOTE — Telephone Encounter (Signed)
 Called pt to discuss the surgery that Dr Rosemarie Conquest is wanting next week (6/11) pt is on mounjaro  and I need to know when he took it last because he would have to be off for a week.  I was not able to leave a message. I also sent a my chart message to the pt asking him to call me.

## 2023-09-25 ENCOUNTER — Encounter: Payer: Self-pay | Admitting: Family Medicine

## 2023-09-25 ENCOUNTER — Other Ambulatory Visit: Payer: Self-pay | Admitting: Family Medicine

## 2023-09-25 MED ORDER — OXYCODONE HCL 10 MG PO TABS: 5.0000 mg | ORAL_TABLET | Freq: Three times a day (TID) | ORAL | 0 refills | Status: DC | PRN

## 2023-09-29 ENCOUNTER — Telehealth: Payer: Self-pay | Admitting: Neurology

## 2023-09-29 ENCOUNTER — Other Ambulatory Visit: Payer: Self-pay

## 2023-09-29 ENCOUNTER — Encounter (HOSPITAL_COMMUNITY): Payer: Self-pay | Admitting: Podiatry

## 2023-09-29 NOTE — Telephone Encounter (Signed)
 Copied from CRM [id number]. Topic: General - Other >> Sep 29, 2023  2:42 PM Abigail D wrote: Reason for CRM: [facility], faxed over an HMP form and received it back blank. His surgery is tomorrow and they need this as soon as possible.
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
## 2023-09-29 NOTE — Telephone Encounter (Signed)
 Called spoke with [facility] and advised her that paperwork is been fax back today,

## 2023-09-29 NOTE — Anesthesia Preprocedure Evaluation (Signed)
 Anesthesia Evaluation  Patient identified by MRN, date of birth, ID band Patient awake    Reviewed: Allergy & Precautions, H&P , NPO status , Patient's Chart, lab work & pertinent test results  Airway Mallampati: II   Neck ROM: full    Dental   Pulmonary sleep apnea    breath sounds clear to auscultation       Cardiovascular hypertension, +CHF   Rhythm:regular Rate:Normal     Neuro/Psych  PSYCHIATRIC DISORDERS Anxiety Depression       GI/Hepatic   Endo/Other  diabetes, Type 2  Class 3 obesity  Renal/GU      Musculoskeletal  (+) Arthritis ,    Abdominal   Peds  Hematology   Anesthesia Other Findings   Reproductive/Obstetrics                             Anesthesia Physical Anesthesia Plan  ASA: 3  Anesthesia Plan: MAC   Post-op Pain Management:    Induction: Intravenous  PONV Risk Score and Plan: 1 and Propofol  infusion and Treatment may vary due to age or medical condition  Airway Management Planned: Simple Face Mask  Additional Equipment:   Intra-op Plan:   Post-operative Plan:   Informed Consent: I have reviewed the patients History and Physical, chart, labs and discussed the procedure including the risks, benefits and alternatives for the proposed anesthesia with the patient or authorized representative who has indicated his/her understanding and acceptance.     Dental advisory given  Plan Discussed with: CRNA, Anesthesiologist and Surgeon  Anesthesia Plan Comments: (PAT note written 09/29/2023 by Allison Zelenak, PA-C.  )       Anesthesia Quick Evaluation

## 2023-09-29 NOTE — Progress Notes (Signed)
 SDW call  Patient was given pre-op instructions over the phone. Patient verbalized understanding of instructions provided.     PCP - Dr. Dawna Etienne Cardiologist - Dr. Grady Lawman Pulmonary:    PPM/ICD - denies Device Orders - na Rep Notified - na   Chest x-ray - 08/16/2023 EKG -  DOS, 09/30/2023 Stress Test -  ECHO - 10/17/2018 Cardiac Cath - 06/09/2017  Sleep Study/sleep apnea/CPAP: OSA with nightly CPAP  Type II diabetic.  A1C 7.2 08/11/2023 Fasting Blood sugar range: 123-130 How often check sugars: weekly Metformin , hold DOS  GLP1: has not started his Mounjaro  yet   Blood Thinner Instructions: denies Aspirin  Instructions:denies   ERAS Protcol - Clears until 0915  Anesthesia review: Yes. CHF, HT< DM, OSA with CPAP, admit 4/21-4/28   Patient denies shortness of breath, fever, cough and chest pain over the phone call  Your procedure is scheduled on Wednesday September 30, 2023  Report to [facility] Main Entrance "A" at  0945  A.M., then check in with the Admitting office.  Call this number if you have problems the morning of surgery:  [phone]   If you have any questions prior to your surgery date call [phone]: Open Monday-Friday 8am-4pm If you experience any cold or flu symptoms such as cough, fever, chills, shortness of breath, etc. between now and your scheduled surgery, please notify us  at the above number    Remember:  Do not eat after midnight the night before your surgery  You may drink clear liquids until 0915    the morning of your surgery.   Clear liquids allowed are: Water , Non-Citrus Juices (without pulp), Carbonated Beverages, Clear Tea, Black Coffee ONLY (NO MILK, CREAM OR POWDERED CREAMER of any kind), and Gatorade   Take these medicines the morning of surgery with A SIP OF WATER :  Allopurinol , amlodipine , carvedilol , duricef, requip   As needed: Clonazepam , robaxin , oxycodone   As of today, STOP taking any Aspirin  (unless otherwise  instructed by your surgeon) Aleve , Naproxen , Ibuprofen , Motrin , Advil , Goody's, BC's, all herbal medications, fish oil, and all vitamins.

## 2023-09-30 ENCOUNTER — Ambulatory Visit (HOSPITAL_COMMUNITY)
Admission: RE | Admit: 2023-09-30 | Discharge: 2023-09-30 | Disposition: A | Discharge status: Home / Self Care | Attending: Podiatry | Admitting: Podiatry

## 2023-09-30 ENCOUNTER — Ambulatory Visit (HOSPITAL_COMMUNITY): Payer: Self-pay | Admitting: Vascular Surgery

## 2023-09-30 ENCOUNTER — Encounter (HOSPITAL_COMMUNITY)
Admission: RE | Disposition: A | Discharge status: Home / Self Care | Payer: Self-pay | Source: Home / Self Care | Attending: Podiatry

## 2023-09-30 ENCOUNTER — Other Ambulatory Visit: Payer: Self-pay

## 2023-09-30 ENCOUNTER — Encounter: Payer: Self-pay | Admitting: Internal Medicine

## 2023-09-30 DIAGNOSIS — I509 Heart failure, unspecified: Secondary | ICD-10-CM

## 2023-09-30 DIAGNOSIS — I2510 Atherosclerotic heart disease of native coronary artery without angina pectoris: Secondary | ICD-10-CM | POA: Insufficient documentation

## 2023-09-30 DIAGNOSIS — L97509 Non-pressure chronic ulcer of other part of unspecified foot with unspecified severity: Secondary | ICD-10-CM | POA: Diagnosis not present

## 2023-09-30 DIAGNOSIS — E1140 Type 2 diabetes mellitus with diabetic neuropathy, unspecified: Secondary | ICD-10-CM | POA: Insufficient documentation

## 2023-09-30 DIAGNOSIS — I3481 Nonrheumatic mitral (valve) annulus calcification: Secondary | ICD-10-CM | POA: Insufficient documentation

## 2023-09-30 DIAGNOSIS — I110 Hypertensive heart disease with heart failure: Secondary | ICD-10-CM | POA: Diagnosis not present

## 2023-09-30 DIAGNOSIS — E119 Type 2 diabetes mellitus without complications: Secondary | ICD-10-CM

## 2023-09-30 DIAGNOSIS — E11621 Type 2 diabetes mellitus with foot ulcer: Secondary | ICD-10-CM | POA: Insufficient documentation

## 2023-09-30 DIAGNOSIS — G4730 Sleep apnea, unspecified: Secondary | ICD-10-CM | POA: Diagnosis not present

## 2023-09-30 DIAGNOSIS — L97522 Non-pressure chronic ulcer of other part of left foot with fat layer exposed: Secondary | ICD-10-CM | POA: Insufficient documentation

## 2023-09-30 HISTORY — PX: ALLOGRAFT APPLICATION: SHX6404

## 2023-09-30 HISTORY — PX: INCISION AND DRAINAGE OF WOUND: SHX1803

## 2023-09-30 LAB — SURGICAL PCR SCREEN
MRSA, PCR: NEGATIVE
Staphylococcus aureus: NEGATIVE

## 2023-09-30 LAB — GLUCOSE, CAPILLARY
Glucose-Capillary: 137 mg/dL — ABNORMAL HIGH (ref 70–99)
Glucose-Capillary: 107 mg/dL — ABNORMAL HIGH (ref 70–99)
Glucose-Capillary: 110 mg/dL — ABNORMAL HIGH (ref 70–99)

## 2023-09-30 LAB — CBC
HCT: 34.8 % — ABNORMAL LOW (ref 39.0–52.0)
Hemoglobin: 10.9 g/dL — ABNORMAL LOW (ref 13.0–17.0)
MCH: 25.7 pg — ABNORMAL LOW (ref 26.0–34.0)
MCHC: 31.3 g/dL (ref 30.0–36.0)
MCV: 82.1 fL (ref 80.0–100.0)
Platelets: 211 10*3/uL (ref 150–400)
RBC: 4.24 MIL/uL (ref 4.22–5.81)
RDW: 15.9 % — ABNORMAL HIGH (ref 11.5–15.5)
WBC: 8.6 10*3/uL (ref 4.0–10.5)
nRBC: 0 % (ref 0.0–0.2)

## 2023-09-30 LAB — BASIC METABOLIC PANEL WITH GFR
Anion gap: 9 (ref 5–15)
BUN: 22 mg/dL — ABNORMAL HIGH (ref 6–20)
CO2: 28 mmol/L (ref 22–32)
Calcium: 8.1 mg/dL — ABNORMAL LOW (ref 8.9–10.3)
Chloride: 102 mmol/L (ref 98–111)
Creatinine, Ser: 1.25 mg/dL — ABNORMAL HIGH (ref 0.61–1.24)
GFR, Estimated: >60 mL/min (ref >60)
Glucose, Bld: 121 mg/dL — ABNORMAL HIGH (ref 70–99)
Potassium: 4.1 mmol/L (ref 3.5–5.1)
Sodium: 139 mmol/L (ref 135–145)

## 2023-09-30 SURGERY — IRRIGATION AND DEBRIDEMENT WOUND
Anesthesia: Monitor Anesthesia Care | Laterality: Left

## 2023-09-30 MED ORDER — OXYCODONE HCL 5 MG PO TABS: 5.0000 mg | ORAL_TABLET | Freq: Once | ORAL | Status: DC | PRN

## 2023-09-30 MED ORDER — BUPIVACAINE HCL (PF) 0.5 % IJ SOLN
INTRAMUSCULAR | Status: DC | PRN
  Administered 2023-09-30: 10 mL

## 2023-09-30 MED ORDER — FENTANYL CITRATE (PF) 250 MCG/5ML IJ SOLN
INTRAMUSCULAR | Status: AC
  Filled 2023-09-30: qty 5

## 2023-09-30 MED ORDER — MIDAZOLAM HCL 2 MG/2ML IJ SOLN
INTRAMUSCULAR | Status: DC | PRN
  Administered 2023-09-30: 2 mg via INTRAVENOUS

## 2023-09-30 MED ORDER — 0.9 % SODIUM CHLORIDE (POUR BTL) OPTIME
TOPICAL | Status: DC | PRN
  Administered 2023-09-30: 1000 mL

## 2023-09-30 MED ORDER — KETOROLAC TROMETHAMINE 30 MG/ML IJ SOLN
INTRAMUSCULAR | Status: AC
  Filled 2023-09-30: qty 1

## 2023-09-30 MED ORDER — MIDAZOLAM HCL 2 MG/2ML IJ SOLN
INTRAMUSCULAR | Status: AC
  Filled 2023-09-30: qty 2

## 2023-09-30 MED ORDER — LIDOCAINE 2% (20 MG/ML) 5 ML SYRINGE
INTRAMUSCULAR | Status: AC
  Filled 2023-09-30: qty 5

## 2023-09-30 MED ORDER — FENTANYL CITRATE (PF) 100 MCG/2ML IJ SOLN: 25.0000 ug | INTRAMUSCULAR | Status: DC | PRN

## 2023-09-30 MED ORDER — ONDANSETRON HCL 4 MG/2ML IJ SOLN: 4.0000 mg | Freq: Four times a day (QID) | INTRAMUSCULAR | Status: DC | PRN

## 2023-09-30 MED ORDER — LACTATED RINGERS IV SOLN: INTRAVENOUS | Status: DC

## 2023-09-30 MED ORDER — INSULIN ASPART 100 UNIT/ML IJ SOLN: 0.0000 [IU] | INTRAMUSCULAR | Status: DC | PRN

## 2023-09-30 MED ORDER — ORAL CARE MOUTH RINSE: 15.0000 mL | Freq: Once | OROMUCOSAL | Status: AC

## 2023-09-30 MED ORDER — FENTANYL CITRATE (PF) 250 MCG/5ML IJ SOLN
INTRAMUSCULAR | Status: DC | PRN
  Administered 2023-09-30: 20 ug via INTRAVENOUS
  Administered 2023-09-30: 30 ug via INTRAVENOUS
  Administered 2023-09-30: 20 ug via INTRAVENOUS

## 2023-09-30 MED ORDER — CHLORHEXIDINE GLUCONATE 0.12 % MT SOLN: 15.0000 mL | Freq: Once | OROMUCOSAL | Status: AC

## 2023-09-30 MED ORDER — PROPOFOL 10 MG/ML IV BOLUS
INTRAVENOUS | Status: AC
Start: 2023-09-30 — End: 2023-09-30
  Filled 2023-09-30: qty 20

## 2023-09-30 MED ORDER — CHLORHEXIDINE GLUCONATE 0.12 % MT SOLN
OROMUCOSAL | Status: AC
  Administered 2023-09-30: 15 mL via OROMUCOSAL
  Filled 2023-09-30: qty 15

## 2023-09-30 MED ORDER — ONDANSETRON HCL 4 MG/2ML IJ SOLN
INTRAMUSCULAR | Status: DC | PRN
  Administered 2023-09-30: 4 mg via INTRAVENOUS

## 2023-09-30 MED ORDER — OXYCODONE HCL 5 MG/5ML PO SOLN: 5.0000 mg | Freq: Once | ORAL | Status: DC | PRN

## 2023-09-30 MED ORDER — DEXAMETHASONE SODIUM PHOSPHATE 10 MG/ML IJ SOLN
INTRAMUSCULAR | Status: AC
  Filled 2023-09-30: qty 1

## 2023-09-30 MED ORDER — LIDOCAINE 2% (20 MG/ML) 5 ML SYRINGE
INTRAMUSCULAR | Status: DC | PRN
  Administered 2023-09-30: 100 mg via INTRAVENOUS

## 2023-09-30 MED ORDER — PROPOFOL 500 MG/50ML IV EMUL
INTRAVENOUS | Status: DC | PRN
  Administered 2023-09-30: 50 ug/kg/min via INTRAVENOUS

## 2023-09-30 MED ORDER — ONDANSETRON HCL 4 MG/2ML IJ SOLN
INTRAMUSCULAR | Status: AC
  Filled 2023-09-30: qty 2

## 2023-09-30 MED ORDER — SODIUM CHLORIDE 0.9 % IR SOLN
Status: DC | PRN
  Administered 2023-09-30: 1000 mL

## 2023-09-30 MED ORDER — BUPIVACAINE HCL (PF) 0.5 % IJ SOLN
INTRAMUSCULAR | Status: AC
  Filled 2023-09-30: qty 30

## 2023-09-30 SURGICAL SUPPLY — 48 items
ALLOGRAFT AMNI BIOVANCE 3X6 3L (Graft) IMPLANT
BLADE SURG 15 STRL LF DISP TIS (BLADE) ×1 IMPLANT
BNDG COMPR ESMARK 4X3 LF (GAUZE/BANDAGES/DRESSINGS) ×1 IMPLANT
BNDG ELASTIC 3INX 5YD STR LF (GAUZE/BANDAGES/DRESSINGS) ×1 IMPLANT
BNDG ELASTIC 4INX 5YD STR LF (GAUZE/BANDAGES/DRESSINGS) ×1 IMPLANT
BNDG GAUZE DERMACEA FLUFF 4 (GAUZE/BANDAGES/DRESSINGS) ×1 IMPLANT
CHLORAPREP W/TINT 26 (MISCELLANEOUS) ×1 IMPLANT
CNTNR URN SCR LID CUP LEK RST (MISCELLANEOUS) ×1 IMPLANT
COVER BACK TABLE 60X90IN (DRAPES) ×1 IMPLANT
CUFF TOURN SGL QUICK 18X4 (TOURNIQUET CUFF) ×1 IMPLANT
CUFF TRNQT CYL 24X4X16.5-23 (TOURNIQUET CUFF) ×1 IMPLANT
DRAPE 3/4 80X56 (DRAPES) ×1 IMPLANT
DRAPE EXTREMITY T 121X128X90 (DISPOSABLE) ×1 IMPLANT
DRAPE SHEET LG 3/4 BI-LAMINATE (DRAPES) ×1 IMPLANT
DRAPE U-SHAPE 47X51 STRL (DRAPES) ×1 IMPLANT
DRSG ADAPTIC 3X8 NADH LF (GAUZE/BANDAGES/DRESSINGS) IMPLANT
ELECT REM PT RETURN 15FT ADLT (MISCELLANEOUS) ×1 IMPLANT
GAUZE SPONGE 4X4 12PLY STRL (GAUZE/BANDAGES/DRESSINGS) ×1 IMPLANT
GAUZE XEROFORM 1X8 LF (GAUZE/BANDAGES/DRESSINGS) ×1 IMPLANT
GLOVE BIO SURGEON STRL SZ7.5 (GLOVE) ×1 IMPLANT
GLOVE BIOGEL PI IND STRL 7.5 (GLOVE) ×1 IMPLANT
GOWN STRL REUS W/ TWL XL LVL3 (GOWN DISPOSABLE) ×1 IMPLANT
KIT BASIN OR (CUSTOM PROCEDURE TRAY) ×1 IMPLANT
KIT TURNOVER KIT B (KITS) IMPLANT
MANIFOLD NEPTUNE II (INSTRUMENTS) ×1 IMPLANT
NDL HYPO 25X1 1.5 SAFETY (NEEDLE) ×1 IMPLANT
NEEDLE HYPO 25X1 1.5 SAFETY (NEEDLE) ×1 IMPLANT
NS IRRIG 1000ML POUR BTL (IV SOLUTION) ×1 IMPLANT
PAD ABD 8X10 STRL (GAUZE/BANDAGES/DRESSINGS) IMPLANT
PADDING CAST ABS COTTON 4X4 ST (CAST SUPPLIES) ×1 IMPLANT
PENCIL SMOKE EVACUATOR (MISCELLANEOUS) IMPLANT
SET HNDPC FAN SPRY TIP SCT (DISPOSABLE) IMPLANT
SET IRRIG Y TYPE TUR BLADDER L (SET/KITS/TRAYS/PACK) ×1 IMPLANT
SPONGE T-LAP 4X18 ~~LOC~~+RFID (SPONGE) ×1 IMPLANT
STAPLER SKIN PROX WIDE 3.9 (STAPLE) ×1 IMPLANT
STOCKINETTE 6 STRL (DRAPES) ×1 IMPLANT
SUCTION TUBE FRAZIER 10FR DISP (SUCTIONS) ×1 IMPLANT
SUT ETHILON 3 0 PS 1 (SUTURE) ×1 IMPLANT
SUT ETHILON 4 0 PS 2 18 (SUTURE) ×1 IMPLANT
SUT MNCRL AB 3-0 PS2 18 (SUTURE) ×1 IMPLANT
SUT MNCRL AB 4-0 PS2 18 (SUTURE) ×1 IMPLANT
SUT VIC AB 2-0 SH 27XBRD (SUTURE) ×1 IMPLANT
SYR BULB EAR ULCER 3OZ GRN STR (SYRINGE) ×1 IMPLANT
SYR CONTROL 10ML LL (SYRINGE) ×1 IMPLANT
TUBE CONNECTING 12X1/4 (SUCTIONS) ×1 IMPLANT
TUBE IRRIGATION SET MISONIX (TUBING) ×1 IMPLANT
UNDERPAD 30X36 HEAVY ABSORB (UNDERPADS AND DIAPERS) ×1 IMPLANT
YANKAUER SUCT BULB TIP NO VENT (SUCTIONS) ×1 IMPLANT

## 2023-09-30 NOTE — Transfer of Care (Signed)
 Immediate Anesthesia Transfer of Care Note  Patient: Justin Hall  Procedure(s) Performed: IRRIGATION AND DEBRIDEMENT WOUND (Left) APPLICATION, ALLOGRAFT, SKIN (Left)  Patient Location: PACU  Anesthesia Type:MAC  Level of Consciousness: awake, alert , oriented, and patient cooperative  Airway & Oxygen Therapy: Patient Spontanous Breathing and Patient connected to face mask oxygen  Post-op Assessment: Report given to RN and Post -op Vital signs reviewed and stable  Post vital signs: Reviewed and stable  Last Vitals:  Vitals Value Taken Time  BP 116/79 09/30/23 1300  Temp    Pulse 72 09/30/23 1305  Resp 20 09/30/23 1305  SpO2 96 % 09/30/23 1305  Vitals shown include unfiled device data.  Last Pain:  Vitals:   09/30/23 1035  TempSrc:   PainSc: 4       Patients Stated Pain Goal: 2 (09/30/23 1035)  Complications: No notable events documented.

## 2023-09-30 NOTE — Anesthesia Postprocedure Evaluation (Signed)
 Anesthesia Post Note  Patient: Almalik Weissberg  Procedure(s) Performed: IRRIGATION AND DEBRIDEMENT WOUND (Left) APPLICATION, ALLOGRAFT, SKIN (Left)     Patient location during evaluation: PACU Anesthesia Type: MAC Level of consciousness: awake and alert Pain management: pain level controlled Vital Signs Assessment: post-procedure vital signs reviewed and stable Respiratory status: spontaneous breathing, nonlabored ventilation, respiratory function stable and patient connected to nasal cannula oxygen Cardiovascular status: stable and blood pressure returned to baseline Postop Assessment: no apparent nausea or vomiting Anesthetic complications: no   No notable events documented.  Last Vitals:  Vitals:   09/30/23 1306 09/30/23 1315  BP:  (!) 143/87  Pulse: 79 80  Resp: (!) 22 20  Temp:  36.7 C  SpO2: 94% 92%    Last Pain:  Vitals:   09/30/23 1315  TempSrc:   PainSc: 0-No pain                 Sayler Mickiewicz S

## 2023-09-30 NOTE — Op Note (Signed)
 Full Operative Report  Date of Operation: 12:56 PM, 09/30/2023   Patient: Justin Hall - 58 y.o. male  Surgeon: Evertt Hoe, DPM   Assistant: None  Diagnosis: Ulcer of left foot, with fat layer exposed  Procedure:  1.  Irrigation debridement of ulceration with preparation for graft application, 5.5 x 5 cm, left foot 2.  Application amniotic graft 5.5 x 5 cm, left foot    Anesthesia: Monitor Anesthesia Care  Ellena Gurney, MD  Anesthesiologist: Ellena Gurney, MD CRNA: Adelene Homer A, CRNA   Estimated Blood Loss: Minimal   Hemostasis: 1) Anatomical dissection, mechanical compression, electrocautery 2) no tourniquet was used in procedure  Implants: Implant Name Type Inv. Item Serial No. Manufacturer Lot No. LRB No. Used Action  ALLOGRAFT AMNI BIOVANCE 3X6 3L - [id number] Graft ALLOGRAFT AMNI BIOVANCE 3X6 3L [id number] [employer]  Left 1 Implanted  ALLOGRAFT AMNI BIOVANCE 3X6 3L - [id number] Graft ALLOGRAFT AMNI BIOVANCE 3X6 3L [id number] P0 [employer]  Left 1 Implanted    Materials: Skin staples  Injectables: 1) Pre-operatively: 10 cc of d 0.5% marcaine  plain 2) Post-operatively: None   Specimens: - Pathology: None - Microbiology: None   Antibiotics: Patient currently on outpatient p.o. antibiotics per infectious disease continue  Drains: None  Complications: Patient tolerated the procedure well without complication.   Operative findings: As below in detailed report  Indications for Procedure: [name] presents to Evertt Hoe, DPM with a chief complaint of chronic wound to the lateral aspect of the left forefoot status post prior partial fifth ray amputation.  He has been on wound VAC therapy since procedure approximately 6 weeks ago now presenting for planned return to the operating room for repeat debridement and amniotic graft application.  The patient has failed conservative treatments of various modalities. At this time the  patient has elected to proceed with surgical correction. All alternatives, risks, and complications of the procedures were thoroughly explained to the patient. Patient exhibits appropriate understanding of all discussion points and informed consent was signed and obtained in the chart with no guarantees to surgical outcome given or implied.  Description of Procedure: Patient was brought to the operating room. Patient remained on their hospital stretcher in the supine position. A surgical timeout was performed and all members of the operating room, the procedure, and the surgical site were identified. anesthesia occurred as per anesthesia record. Local anesthetic as previously described was then injected about the operative field in a local infiltrative block.  The operative lower extremity as noted above was then prepped and draped in the usual sterile manner. The following procedure then began.  Attention was directed the ulceration present on the lateral aspect of the left foot.  The wound measured approximately 5.5 x 5 cm predebridement.  The wound had a very healthy granular tissue bed following wound VAC therapy.  There is no evidence of significant necrotic or fibrotic tissues in the wound bed. The wound was completely inspected. The wound base had hypergranular tissue. There were no signs of infection, no sinus tracts, or fibrotic tissues. The wound was then prepped for graft coverage. The wound margins were sharply curretaged. The entire wound base thoroughly debrided with curettes and rongeurs to the level of healthy viable tissue. The wound was then irrigated with 1L of saline with pulse lavage. The wound measured 5.5 x 5 cm. Hemostasis was achieved with electrocautery.   Next in order to promote healing of the ulceration the Arthrex Biovance amniotic graft 3 x 6  cm x 2 pieces was applied to the wound.  This was held in place with skin staples.  There was noted to be complete coverage of the  ulceration with the graft following application.  No graft was wasted.  The wound was then dressed with nonadherent dressing as below.  The surgical site was then dressed with Arthrex jumpstart nonadhesive dressing followed by 4 x 4 gauze ABD pad Kerlix and Ace wrap. The patient tolerated both the procedure and anesthesia well with vital signs stable throughout. The patient was transferred in good condition and all vital signs stable  from the OR to recovery under the discretion of anesthesia.  Condition: Vital signs stable, neurovascular status unchanged from preoperative   Surgical plan:  Ulceration appears very healthy following debridement and graft application.  No evidence of deep infection infection of the ulceration.  Of note there were noted to be approximately 5 maggots present in between the 3rd and 4th toe upon removal of the wound VAC dressing which had become displaced.  Leave dressings intact until follow-up, limited weightbearing in a postop shoe to the left foot  The patient will be short distance weightbearing as tolerated in a soft shoe to the operative limb until further instructed. The dressing is to remain clean, dry, and intact. Will continue to follow unless noted elsewhere.   Russ Course, DPM [facility]

## 2023-09-30 NOTE — Discharge Instructions (Signed)
 Foot & Ankle Surgery Patient Discharge Instructions:   Arrange to have an adult drive you home after surgery. If you had general anesthesia, it may take a day or more to fully recover. So, for at least the next 24 hours: Do not drive or use machinery or power tools; do not drink alcohol ; and do not make any major decisions.   Bandages: Keep your dressings clean, dry, and intact. Do not remove or change. When bathing/showering, cover the bandage or cast with a plastic bag to keep it dry (still keep the area away from the water ) and use a chair, do not stand. Don't remove your bandage until your doctor tells you to. If your bandage gets wet or dirty, check with your doctor. You can likely replace it with a clean, dry one.  Activity: Weight bearing for short distances/transfers in post op shoe to left foot Sit or lie down when possible. Put a pillow under your heel to raise your foot above the level of your heart. Place ice bag or pack behind knee on surgical side for 20 minutes every hour that you are awake for the first 24-48 hours. You can drive again when instructed by your doctor. Wear your surgical shoe at all times unless told otherwise by your health care provider. Use crutches or a cane as directed. Follow your doctor's instructions about putting weight on your foot.  Diet: Start with liquids and light foods (such as dry toast, bananas, and applesauce). As you feel up to it, slowly return to your normal diet. Drink at least six to eight glasses of water  or other nonalcoholic fluids a day. To avoid nausea, eat before taking narcotic pain medications.  Medications: Take all medications as instructed. Take pain medications on time. Do not wait until the pain is bad before taking your medications. Avoid alcohol  while on pain medications.  Call your doctor if: Continuous bleeding through dressings. If your dressings get wet. Fevers over 100.4 degrees Fahrenheit (38 degrees  Celsius). Chest pains or difficulty breathing.
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
## 2023-10-01 ENCOUNTER — Encounter (HOSPITAL_COMMUNITY): Payer: Self-pay | Admitting: Podiatry

## 2023-10-01 ENCOUNTER — Encounter: Admitting: Podiatry

## 2023-10-07 ENCOUNTER — Telehealth: Payer: Self-pay | Admitting: Podiatry

## 2023-10-07 NOTE — Telephone Encounter (Signed)
 Called patient to relay providers message which is : Yes, he will he be able to stand at work 2 days after nail is taken off. And yes, Is it okay to push nail removal appointment back 10 days (7/7) .

## 2023-10-08 ENCOUNTER — Ambulatory Visit (INDEPENDENT_AMBULATORY_CARE_PROVIDER_SITE_OTHER): Admitting: Podiatry

## 2023-10-08 DIAGNOSIS — M86172 Other acute osteomyelitis, left ankle and foot: Secondary | ICD-10-CM

## 2023-10-08 DIAGNOSIS — Z9889 Other specified postprocedural states: Secondary | ICD-10-CM

## 2023-10-08 DIAGNOSIS — L97522 Non-pressure chronic ulcer of other part of left foot with fat layer exposed: Secondary | ICD-10-CM

## 2023-10-08 NOTE — Progress Notes (Signed)
 Subjective:  Patient ID: Justin Hall, male    DOB: Oct 04, 1965,  MRN: [id number]  Chief Complaint  Patient presents with   Post-op Follow-up    Skin graft still in place. Dressings are soaked in drainage that is yellowish-greenish. There is odor to the area. Patient reports pain as well. Wound care nurse came out on Monday but patient stopped them from removing graft because he did not believe that it should have been removed.     DOS: 08/12/2023 Procedure: 1.  Partial fifth ray amputation, left foot 2.  Application antibiotic dissolvable beads, left foot 3.  Application dermal allograft 38 cm, left foot  DOS 08/14/23 1.  Repeat irrigation and debridement of surgical site with prep for graft, 5.5 x 5 cm, left foot 2.  Application dermal allograft 5 x 5 cm, left foot 3.  Application negative pressure wound therapy 5.5 x 5 cm, left foot  DOS 09/30/2023 1.  Irrigation debridement of ulceration with preparation for graft application, 5.5 x 5 cm, left foot 2.  Application amniotic graft 5.5 x 5 cm, left foot     58 y.o. male seen for post op check.    Patient following up for first office visit after repeat debridement and graft application to the left foot.  Has had a lot of drainage in the dressings has left the dressings clean dry and intact as instructed since surgery due to graft application.  Review of Systems: Negative except as noted in the HPI. Denies N/V/F/Ch.   Objective:   There were no vitals filed for this visit.  There is no height or weight on file to calculate BMI. Constitutional Well developed. Well nourished.  Vascular Foot warm and well perfused. Capillary refill normal to all digits.   No calf pain with palpation  Neurologic Normal speech. Oriented to person, place, and time. Epicritic sensation grossly intact to the left lower extremity  Dermatologic Wound overall appears to be doing well with some marginal erythema around the ulceration.  Staples are intact.   There is some maceration of the hyperkeratotic tissue border which was debrided.  There is healthy granular tissue in the wound bed with graft well adhered overlying.  Measures approximately same as postoperative at this time   Orthopedic: Status post partial fifth ray amputation left foot with large soft tissue deficit   Radiographs: Status post interval amputation fifth ray at the level of the proximal fifth met shaft with antibiotic beads placed  Pathology: A. TOE, LEFT FIFTH, AMPUTATION:       Chronic osteomyelitits.       Subcutaneous tissue with acute inflammation, ulceration and abscess  formation.       Resection margin is viable without necrosis or acute  osteomyelitis.   B. BONE, LEFT FIFTH METATARSAL PROXIMAL, MARGIN:       Bone with reactive changes.       Negative for osteomyelitits.    Micro:  STAPHYLOCOCCUS SIMULANS , GROUP B STREP(S.AGALACTIAE)ISOLATED   Assessment:   Osteomyelitis of fifth ray status post partial fifth ray amputation, now s/p repeat debridement, amniotic graft application  Plan:  Patient was evaluated and treated and all questions answered.  1 weeks s/p repeat debridement, amniotic graft application -Overall looking okay today, lightly debrided the wound margins and redressed with silver  alginate dressing 4 x 4 gauze ABD pad Kerlix and Ace -Continue twice weekly dressing changes as above - plans for 2x dressing changes per home health RN -XR: Expected postop changes -WB Status: Weightbearing  as tolerated in postop shoe  -Medications/ABX: Abx per ID, continue on p.o. ABX per their recommendations -  Follow up next week for additional wound check and further debridement and wound care        Maridee Shoemaker, DPM [facility] / [facility]

## 2023-10-10 ENCOUNTER — Other Ambulatory Visit: Payer: Self-pay | Admitting: Family Medicine

## 2023-10-10 DIAGNOSIS — M1A9XX0 Chronic gout, unspecified, without tophus (tophi): Secondary | ICD-10-CM

## 2023-10-10 DIAGNOSIS — I10 Essential (primary) hypertension: Secondary | ICD-10-CM

## 2023-10-14 ENCOUNTER — Encounter: Payer: Self-pay | Admitting: Internal Medicine

## 2023-10-14 ENCOUNTER — Ambulatory Visit (INDEPENDENT_AMBULATORY_CARE_PROVIDER_SITE_OTHER): Admitting: Family

## 2023-10-14 ENCOUNTER — Encounter: Payer: Self-pay | Admitting: Family

## 2023-10-14 ENCOUNTER — Other Ambulatory Visit: Payer: Self-pay

## 2023-10-14 VITALS — BP 129/83 | HR 87 | Temp 97.8°F | Wt 297.6 lb

## 2023-10-14 DIAGNOSIS — L97529 Non-pressure chronic ulcer of other part of left foot with unspecified severity: Secondary | ICD-10-CM | POA: Diagnosis not present

## 2023-10-14 DIAGNOSIS — E11621 Type 2 diabetes mellitus with foot ulcer: Secondary | ICD-10-CM | POA: Diagnosis present
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
## 2023-10-14 NOTE — Patient Instructions (Signed)
 Nice to see you.  Continue to take your antibiotic until completed.  Continue wound care per Dr. Malvin.  Plan follow up in 3-4 weeks for check.  Have a great day and stay safe!

## 2023-10-14 NOTE — Assessment & Plan Note (Addendum)
 Justin Hall continues to take Cefadroxil  and receive wound care per Podiatry for his diabetic food ulcer s/p debridement. Wound appears to be healing adequately. Recommend to complete remaining antibiotics and continue with wound care. Will plan to follow up in about 1 month to determine any additional need for antibiotic. Encouraged protein intake with slightly low albumin  and continue blood sugar control.

## 2023-10-15 ENCOUNTER — Ambulatory Visit (INDEPENDENT_AMBULATORY_CARE_PROVIDER_SITE_OTHER): Admitting: Podiatry

## 2023-10-15 DIAGNOSIS — Z9889 Other specified postprocedural states: Secondary | ICD-10-CM

## 2023-10-15 DIAGNOSIS — M86172 Other acute osteomyelitis, left ankle and foot: Secondary | ICD-10-CM

## 2023-10-15 NOTE — Progress Notes (Signed)
 Subjective:  Patient ID: Justin Hall, male    DOB: 02/22/1966,  MRN: [id number]  Chief Complaint  Patient presents with   Post-op Follow-up    Wound care is coming out twice weekly, seeing infectious disease. Reports decreased pain, wound is malodorous and drainage is present as well.     DOS: 08/12/2023 Procedure: 1.  Partial fifth ray amputation, left foot 2.  Application antibiotic dissolvable beads, left foot 3.  Application dermal allograft 38 cm, left foot  DOS 08/14/23 1.  Repeat irrigation and debridement of surgical site with prep for graft, 5.5 x 5 cm, left foot 2.  Application dermal allograft 5 x 5 cm, left foot 3.  Application negative pressure wound therapy 5.5 x 5 cm, left foot  DOS 09/30/2023 1.  Irrigation debridement of ulceration with preparation for graft application, 5.5 x 5 cm, left foot 2.  Application amniotic graft 5.5 x 5 cm, left foot     58 y.o. male seen for post op check.    Patient following up for office visit after repeat debridement and graft application to the left foot now 2 weeks postoperative.  He reports the pain is improved from prior.  Does have still some drainage.  Getting the dressing changed twice weekly by home health.  Walking in postop shoe.  Review of Systems: Negative except as noted in the HPI. Denies N/V/F/Ch.   Objective:   There were no vitals filed for this visit.  There is no height or weight on file to calculate BMI. Constitutional Well developed. Well nourished.  Vascular Foot warm and well perfused. Capillary refill normal to all digits.   No calf pain with palpation  Neurologic Normal speech. Oriented to person, place, and time. Epicritic sensation grossly intact to the left lower extremity  Dermatologic Wound improved from prior with decreased erythema though still with some maceration due to excessive drainage. Wound measures approximately 4.5 x 5 cm.  There is a fibrotic tissue layer overlying the healthy  granular tissue bed which bled well upon debridement.     Orthopedic: Status post partial fifth ray amputation left foot with large soft tissue deficit   Radiographs: Status post interval amputation fifth ray at the level of the proximal fifth met shaft with antibiotic beads placed  Pathology: A. TOE, LEFT FIFTH, AMPUTATION:       Chronic osteomyelitits.       Subcutaneous tissue with acute inflammation, ulceration and abscess  formation.       Resection margin is viable without necrosis or acute  osteomyelitis.   B. BONE, LEFT FIFTH METATARSAL PROXIMAL, MARGIN:       Bone with reactive changes.       Negative for osteomyelitits.    Micro:  STAPHYLOCOCCUS SIMULANS , GROUP B STREP(S.AGALACTIAE)ISOLATED   Assessment:   Osteomyelitis of fifth ray status post partial fifth ray amputation, now s/p repeat debridement, amniotic graft application  Plan:  Patient was evaluated and treated and all questions answered.  2 weeks s/p repeat debridement, amniotic graft application -Overall improving with wound care s/p grafting, debrided the wound to healthy bleeding base at this time with excisional debridement of necrotic and fibrotic tissues from the wound bed and replaced a silver  alginate style dressing with dry gauze Kerlix roll and Ace wrap for compression.  -Recommend we increase dressing change frequency to approximately Monday Wednesday Friday or every other day as able by home health.  This will decrease maceration from excessive drainage -XR: Expected postop changes -WB  Status: Weightbearing as tolerated in postop shoe  -Medications/ABX: ABX per ID they have been discontinued per ID appreciate recommendations and agree can monitor off -  Follow up in 2 weeks for additional wound check further debridement and wound care call with any questions or concerns in the interim        Marolyn JULIANNA Honour, DPM [facility] / [facility]

## 2023-10-29 ENCOUNTER — Ambulatory Visit (INDEPENDENT_AMBULATORY_CARE_PROVIDER_SITE_OTHER): Admitting: Podiatry

## 2023-10-29 DIAGNOSIS — Z91199 Patient's noncompliance with other medical treatment and regimen due to unspecified reason: Secondary | ICD-10-CM

## 2023-10-29 NOTE — Progress Notes (Signed)
 NS

## 2023-11-05 ENCOUNTER — Ambulatory Visit (INDEPENDENT_AMBULATORY_CARE_PROVIDER_SITE_OTHER): Admitting: Podiatry

## 2023-11-05 ENCOUNTER — Encounter: Payer: Self-pay | Admitting: Podiatry

## 2023-11-05 DIAGNOSIS — L97522 Non-pressure chronic ulcer of other part of left foot with fat layer exposed: Secondary | ICD-10-CM

## 2023-11-05 DIAGNOSIS — Z9889 Other specified postprocedural states: Secondary | ICD-10-CM

## 2023-11-05 DIAGNOSIS — M86172 Other acute osteomyelitis, left ankle and foot: Secondary | ICD-10-CM

## 2023-11-05 NOTE — Progress Notes (Signed)
 Subjective:  Patient ID: Justin Hall, male    DOB: 12-Sep-1965,  MRN: [id number]  Chief Complaint  Patient presents with   Routine Post Op    POV #3 DOS 09/30/23 LT FOOT ULCER IRRAGATION AND DEBRIDEMENT W/ AMNOITIC GRAFT APPLICATION. NIDDM A1C 6.6. 0 PAIN.    DOS: 08/12/2023 Procedure: 1.  Partial fifth ray amputation, left foot 2.  Application antibiotic dissolvable beads, left foot 3.  Application dermal allograft 38 cm, left foot  DOS 08/14/23 1.  Repeat irrigation and debridement of surgical site with prep for graft, 5.5 x 5 cm, left foot 2.  Application dermal allograft 5 x 5 cm, left foot 3.  Application negative pressure wound therapy 5.5 x 5 cm, left foot  DOS 09/30/2023 1.  Irrigation debridement of ulceration with preparation for graft application, 5.5 x 5 cm, left foot 2.  Application amniotic graft 5.5 x 5 cm, left foot     58 y.o. male seen for post op check.    Patient following up for office visit after repeat debridement and graft application to the left foot now 4 weeks postoperative.   Reports the drainage has decreased lightly he is doing 3 times weekly dressing changes with silver  alginate contact layer.  Home health is assisting.  Walking postop shoe he says is damaged and needs to be replaced.  Review of Systems: Negative except as noted in the HPI. Denies N/V/F/Ch.   Objective:   There were no vitals filed for this visit.  There is no height or weight on file to calculate BMI. Constitutional Well developed. Well nourished.  Vascular Foot warm and well perfused. Capillary refill normal to all digits.   No calf pain with palpation  Neurologic Normal speech. Oriented to person, place, and time. Epicritic sensation grossly intact to the left lower extremity  Dermatologic Wound improved from prior with decreased erythema though still with some maceration though decreased due to excessive drainage. Wound measures approximately 3.5 x 4 cm.  There is a fibrotic  tissue layer overlying the healthy granular tissue bed which bled well upon debridement.     Orthopedic: Status post partial fifth ray amputation left foot with large soft tissue deficit   Radiographs: Status post interval amputation fifth ray at the level of the proximal fifth met shaft with antibiotic beads placed  Pathology: A. TOE, LEFT FIFTH, AMPUTATION:       Chronic osteomyelitits.       Subcutaneous tissue with acute inflammation, ulceration and abscess  formation.       Resection margin is viable without necrosis or acute  osteomyelitis.   B. BONE, LEFT FIFTH METATARSAL PROXIMAL, MARGIN:       Bone with reactive changes.       Negative for osteomyelitits.    Micro:  STAPHYLOCOCCUS SIMULANS , GROUP B STREP(S.AGALACTIAE)ISOLATED   Assessment:   Osteomyelitis of fifth ray status post partial fifth ray amputation, now s/p repeat debridement, amniotic graft application  Plan:  Patient was evaluated and treated and all questions answered.  4 weeks s/p repeat debridement, amniotic graft application -Overall improving with wound care s/p grafting, debrided the wound to healthy bleeding base at this time with excisional debridement of necrotic and fibrotic tissues from the wound bed and replaced a silver  alginate style dressing with dry gauze Kerlix roll and Ace wrap for compression. - Wound margins decreasing with wound care and after graft.  - Continue with dressing change Monday Wednesday Friday or every other day as able by  home health.   -XR: Expected postop changes -WB Status: Weightbearing as tolerated in postop shoe -dispensed an issue to the patient today due to his old shoe being damaged and worn beyond repair -Medications/ABX: ABX per ID they have been discontinued per ID appreciate recommendations and agree can monitor off -  Follow up in 3 weeks for additional wound check further debridement and wound care          Marolyn JULIANNA Honour, DPM [facility]  / [facility]

## 2023-11-09 ENCOUNTER — Ambulatory Visit: Payer: Self-pay

## 2023-11-09 NOTE — Telephone Encounter (Signed)
   Message from [place] M sent at 11/09/2023  9:59 AM EDT  [facility] [phone]- Saturday- weezy- vital signs are okay, 7/21 noticed he has rhonci in both left and right side, she thinks he should maybe be seen

## 2023-11-09 NOTE — Telephone Encounter (Signed)
 FYI Only or Action Required?: Action required by provider: update on patient condition.  Patient was last seen in primary care on 09/18/2023 by Frann Mabel Mt, DO.  Called Nurse Triage reporting Home Health.  Symptoms began several days ago.  Interventions attempted: Other: NA.  Symptoms are: unable to reach patient for triage.  Triage Disposition: No Contact Calls  Patient/caregiver understands and will follow disposition?:   Reason for Disposition  Third attempt to contact caller AND no contact made. Phone number verified.  Answer Assessment - Initial Assessment Questions Received message from [place] from [facility] reporting adventitious lung sounds. Attempted to reach patient for triage X 3 but unsuccessful.  Protocols used: No Contact or Duplicate Contact Call-A-AH

## 2023-11-11 ENCOUNTER — Encounter: Payer: Self-pay | Admitting: Family Medicine

## 2023-11-11 DIAGNOSIS — E1165 Type 2 diabetes mellitus with hyperglycemia: Secondary | ICD-10-CM

## 2023-11-11 DIAGNOSIS — L03119 Cellulitis of unspecified part of limb: Secondary | ICD-10-CM

## 2023-11-12 ENCOUNTER — Other Ambulatory Visit (INDEPENDENT_AMBULATORY_CARE_PROVIDER_SITE_OTHER)

## 2023-11-12 DIAGNOSIS — E1165 Type 2 diabetes mellitus with hyperglycemia: Secondary | ICD-10-CM

## 2023-11-12 DIAGNOSIS — L03119 Cellulitis of unspecified part of limb: Secondary | ICD-10-CM

## 2023-11-12 NOTE — Telephone Encounter (Signed)
 Called pt and lab appt schedule today, labs ordered.

## 2023-11-13 ENCOUNTER — Ambulatory Visit: Admitting: Family Medicine

## 2023-11-13 ENCOUNTER — Ambulatory Visit (INDEPENDENT_AMBULATORY_CARE_PROVIDER_SITE_OTHER): Admitting: Family

## 2023-11-13 ENCOUNTER — Ambulatory Visit: Payer: Self-pay | Admitting: Family Medicine

## 2023-11-13 ENCOUNTER — Other Ambulatory Visit: Payer: Self-pay

## 2023-11-13 ENCOUNTER — Encounter: Payer: Self-pay | Admitting: Family Medicine

## 2023-11-13 VITALS — BP 138/86 | HR 91 | Temp 98.0°F | Resp 16 | Ht 69.0 in | Wt 301.0 lb

## 2023-11-13 VITALS — BP 148/99 | HR 81 | Temp 97.5°F | Ht 69.0 in | Wt 305.0 lb

## 2023-11-13 DIAGNOSIS — J069 Acute upper respiratory infection, unspecified: Secondary | ICD-10-CM

## 2023-11-13 DIAGNOSIS — Z7984 Long term (current) use of oral hypoglycemic drugs: Secondary | ICD-10-CM | POA: Diagnosis not present

## 2023-11-13 DIAGNOSIS — L97429 Non-pressure chronic ulcer of left heel and midfoot with unspecified severity: Secondary | ICD-10-CM | POA: Diagnosis not present

## 2023-11-13 DIAGNOSIS — E1165 Type 2 diabetes mellitus with hyperglycemia: Secondary | ICD-10-CM | POA: Diagnosis not present

## 2023-11-13 DIAGNOSIS — I10 Essential (primary) hypertension: Secondary | ICD-10-CM

## 2023-11-13 DIAGNOSIS — E11621 Type 2 diabetes mellitus with foot ulcer: Secondary | ICD-10-CM | POA: Diagnosis present

## 2023-11-13 LAB — LIPID PANEL
Cholesterol: 139 mg/dL (ref 0–200)
HDL: 37.3 mg/dL — ABNORMAL LOW (ref >39.00)
LDL Cholesterol: 68 mg/dL (ref 0–99)
NonHDL: 101.92
Total CHOL/HDL Ratio: 4
Triglycerides: 171 mg/dL — ABNORMAL HIGH (ref 0.0–149.0)
VLDL: 34.2 mg/dL (ref 0.0–40.0)

## 2023-11-13 LAB — HEMOGLOBIN A1C: Hgb A1c MFr Bld: 6.6 % — ABNORMAL HIGH (ref 4.6–6.5)

## 2023-11-13 LAB — COMPREHENSIVE METABOLIC PANEL WITH GFR
ALT: 7 U/L (ref 0–53)
AST: 11 U/L (ref 0–37)
Albumin: 4.3 g/dL (ref 3.5–5.2)
Alkaline Phosphatase: 95 U/L (ref 39–117)
BUN: 34 mg/dL — ABNORMAL HIGH (ref 6–23)
CO2: 32 meq/L (ref 19–32)
Calcium: 8.9 mg/dL (ref 8.4–10.5)
Chloride: 93 meq/L — ABNORMAL LOW (ref 96–112)
Creatinine, Ser: 1.69 mg/dL — ABNORMAL HIGH (ref 0.40–1.50)
GFR: 43.62 mL/min — ABNORMAL LOW (ref >60.00)
Glucose, Bld: 118 mg/dL — ABNORMAL HIGH (ref 70–99)
Potassium: 4.7 meq/L (ref 3.5–5.1)
Sodium: 137 meq/L (ref 135–145)
Total Bilirubin: 0.6 mg/dL (ref 0.2–1.2)
Total Protein: 7.6 g/dL (ref 6.0–8.3)

## 2023-11-13 LAB — C-REACTIVE PROTEIN: CRP: 2.6 mg/dL (ref 0.5–20.0)

## 2023-11-13 LAB — SEDIMENTATION RATE: Sed Rate: 34 mm/h — ABNORMAL HIGH (ref 0–20)

## 2023-11-13 MED ORDER — METHOCARBAMOL 500 MG PO TABS: 500.0000 mg | ORAL_TABLET | Freq: Two times a day (BID) | ORAL | 1 refills | Status: DC | PRN

## 2023-11-13 MED ORDER — PROMETHAZINE-DM 6.25-15 MG/5ML PO SYRP: 5.0000 mL | ORAL_SOLUTION | Freq: Four times a day (QID) | ORAL | 0 refills | Status: DC | PRN

## 2023-11-13 NOTE — Assessment & Plan Note (Addendum)
 Justin Hall wound appears to be healing slowly and has completed antibiotics about 2 weeks ago. Discussed recommended plan of care to continue to monitor off antibiotics and continue with wound care per Podiatry. Discussed signs/symptoms to watch for that would be concerning for returning infection. Optimize protein intake and blood sugar control . No additional follow up with ID needed at this time and happy to see back as needed.
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
## 2023-11-13 NOTE — Patient Instructions (Signed)
 Nice to see you.  No further antibiotics as needed.   Continue with wound care per Dr. Malvin.   No follow up with ID needed.  Have a great day and stay safe!

## 2023-11-13 NOTE — Patient Instructions (Addendum)
 Continue monitoring your sugars.   Keep the diet clean and stay active.  Let us  know if you need anything.

## 2023-11-26 ENCOUNTER — Telehealth: Payer: Self-pay | Admitting: Family Medicine

## 2023-11-26 ENCOUNTER — Ambulatory Visit: Admitting: Podiatry

## 2023-11-26 NOTE — Telephone Encounter (Signed)
 Copied from CRM [id number]. Topic: Medicare AWV >> Nov 26, 2023  9:52 AM Nathanel DEL wrote: Reason for CRM: Called 11/26/2023 to sched AWV - NO VOICEMAIL  Nathanel Paschal; Care Guide Ambulatory Clinical Support [place] [facility] Direct Dial: [phone]

## 2023-11-30 ENCOUNTER — Other Ambulatory Visit: Payer: Self-pay | Admitting: Family Medicine

## 2023-11-30 DIAGNOSIS — J069 Acute upper respiratory infection, unspecified: Secondary | ICD-10-CM

## 2023-12-01 NOTE — Telephone Encounter (Signed)
 Called pt and sent message @ Klonopin , to let us  know if he still take it.

## 2023-12-02 ENCOUNTER — Encounter: Payer: Self-pay | Admitting: Family Medicine

## 2023-12-02 ENCOUNTER — Telehealth: Admitting: Family Medicine

## 2023-12-02 DIAGNOSIS — J40 Bronchitis, not specified as acute or chronic: Secondary | ICD-10-CM

## 2023-12-02 MED ORDER — AZITHROMYCIN 250 MG PO TABS
ORAL_TABLET | ORAL | 0 refills | Status: DC
Start: 2023-12-02 — End: 2023-12-23

## 2023-12-02 MED ORDER — PREDNISONE 20 MG PO TABS: 40.0000 mg | ORAL_TABLET | Freq: Every day | ORAL | 0 refills | Status: AC

## 2023-12-02 MED ORDER — PROMETHAZINE-DM 6.25-15 MG/5ML PO SYRP: 5.0000 mL | ORAL_SOLUTION | Freq: Four times a day (QID) | ORAL | 0 refills | Status: DC | PRN
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
## 2023-12-04 ENCOUNTER — Encounter: Payer: Self-pay | Admitting: Podiatry

## 2023-12-08 ENCOUNTER — Ambulatory Visit: Admitting: Podiatry

## 2023-12-14 ENCOUNTER — Ambulatory Visit: Admitting: Internal Medicine

## 2023-12-17 ENCOUNTER — Encounter: Payer: Self-pay | Admitting: Podiatry

## 2023-12-17 ENCOUNTER — Ambulatory Visit (INDEPENDENT_AMBULATORY_CARE_PROVIDER_SITE_OTHER): Admitting: Podiatry

## 2023-12-17 DIAGNOSIS — M86172 Other acute osteomyelitis, left ankle and foot: Secondary | ICD-10-CM

## 2023-12-17 DIAGNOSIS — L97522 Non-pressure chronic ulcer of other part of left foot with fat layer exposed: Secondary | ICD-10-CM | POA: Diagnosis not present

## 2023-12-17 DIAGNOSIS — Z9889 Other specified postprocedural states: Secondary | ICD-10-CM

## 2023-12-17 NOTE — Progress Notes (Signed)
 Subjective:  Patient ID: Justin Hall, male    DOB: 1965-10-22,  MRN: [id number]  Chief Complaint  Patient presents with   Foot Ulcer    LT Lateral FOOT ULCER.5th toe removal. NIDDM A1C 5.6 Alginate dressing. 1 pain. Wearing surgical shoe.    DOS: 08/12/2023 Procedure: 1.  Partial fifth ray amputation, left foot 2.  Application antibiotic dissolvable beads, left foot 3.  Application dermal allograft 38 cm, left foot  DOS 08/14/23 1.  Repeat irrigation and debridement of surgical site with prep for graft, 5.5 x 5 cm, left foot 2.  Application dermal allograft 5 x 5 cm, left foot 3.  Application negative pressure wound therapy 5.5 x 5 cm, left foot  DOS 09/30/2023 1.  Irrigation debridement of ulceration with preparation for graft application, 5.5 x 5 cm, left foot 2.  Application amniotic graft 5.5 x 5 cm, left foot     58 y.o. male seen for post op check.    Patient following up for office visit after repeat debridement and graft application to the left foot.  He has been doing well had a respiratory infection that prevented him from getting to several appointments.  He is moving to [place] .  He reports the wound has been improving doing silver  alginate dressings as instructed.  Thinks the wound is getting smaller slowly.  Currently off antibiotics  Review of Systems: Negative except as noted in the HPI. Denies N/V/F/Ch.   Objective:   There were no vitals filed for this visit.  There is no height or weight on file to calculate BMI. Constitutional Well developed. Well nourished.  Vascular Foot warm and well perfused. Capillary refill normal to all digits.   No calf pain with palpation  Neurologic Normal speech. Oriented to person, place, and time. Epicritic sensation grossly intact to the left lower extremity  Dermatologic Wound improved from prior with decreased erythema, good infill of healthy tissue surrounding the wound Wound measures approximately 2.5 x 2.5 cm much  improved from prior with healthy granular tissue bed bleeds well upon debridement     Orthopedic: Status post partial fifth ray amputation left foot with large soft tissue deficit   Radiographs: Status post interval amputation fifth ray at the level of the proximal fifth met shaft with antibiotic beads placed  Pathology: A. TOE, LEFT FIFTH, AMPUTATION:       Chronic osteomyelitits.       Subcutaneous tissue with acute inflammation, ulceration and abscess  formation.       Resection margin is viable without necrosis or acute  osteomyelitis.   B. BONE, LEFT FIFTH METATARSAL PROXIMAL, MARGIN:       Bone with reactive changes.       Negative for osteomyelitits.    Micro:  STAPHYLOCOCCUS SIMULANS , GROUP B STREP(S.AGALACTIAE)ISOLATED   Assessment:   Osteomyelitis of fifth ray status post partial fifth ray amputation, now s/p repeat debridement, amniotic graft application  Plan:  Patient was evaluated and treated and all questions answered.  2.5 months s/p repeat debridement, amniotic graft application -Overall improving with wound care s/p grafting, debrided the wound to healthy bleeding base at this time with excisional debridement of necrotic and fibrotic tissues from the wound bed and replaced a silver  alginate style dressing with dry gauze Kerlix roll and Ace wrap for compression. - Wound margins decreasing with wound care and after graft.  - Continue with dressing change Monday Wednesday Friday or every other day as able  -XR: Expected postop changes -WB  Status: Weightbearing as tolerated in postop shoe  -Medications/ABX: ABX per ID they have been discontinued per ID appreciate recommendations and agree can monitor off -  Follow up in 6 weeks for additional wound check further debridement and wound care    Procedure: Excisional Debridement of Wound Rationale: Removal of non-viable soft tissue from the wound to promote healing.  Anesthesia: None required Pre-Debridement Wound  Measurements: 2.5 cm x 2.5 cm x 0.2 cm  Post-Debridement Wound Measurements: 2.5 cm x 2.5 cm x 0.2 cm  Type of Debridement: Excisional Tissue Removed: Non-viable soft tissue Depth of Debridement: Subcutaneous fat tissue Instrumentation: Tissue nipper Technique: Sharp excisional debridement to bleeding, viable wound base.  Dressing: Dry, sterile, compression dressing. Disposition: Patient tolerated procedure well. Patient to return in 1-2 week for follow-up.         Marolyn JULIANNA [place], DPM [facility] / [facility]

## 2023-12-23 ENCOUNTER — Encounter: Payer: Self-pay | Admitting: Family Medicine

## 2023-12-23 ENCOUNTER — Ambulatory Visit (INDEPENDENT_AMBULATORY_CARE_PROVIDER_SITE_OTHER): Admitting: Family Medicine

## 2023-12-23 VITALS — BP 122/84 | HR 99 | Temp 98.0°F | Resp 16 | Ht 69.0 in | Wt 298.4 lb

## 2023-12-23 DIAGNOSIS — E1165 Type 2 diabetes mellitus with hyperglycemia: Secondary | ICD-10-CM | POA: Diagnosis not present

## 2023-12-23 DIAGNOSIS — I10 Essential (primary) hypertension: Secondary | ICD-10-CM

## 2023-12-23 DIAGNOSIS — Z7985 Long-term (current) use of injectable non-insulin antidiabetic drugs: Secondary | ICD-10-CM | POA: Diagnosis not present

## 2023-12-23 DIAGNOSIS — M25551 Pain in right hip: Secondary | ICD-10-CM | POA: Diagnosis not present

## 2023-12-23 MED ORDER — OXYCODONE HCL 10 MG PO TABS: 5.0000 mg | ORAL_TABLET | Freq: Three times a day (TID) | ORAL | 0 refills | Status: DC | PRN

## 2023-12-23 NOTE — Patient Instructions (Signed)
 Keep the diet clean and stay active.  Strong work with your weight loss.  If you ever need us  to send medicine in [facility], just get us  the pharmacy information.   Let us  know if you need anything.
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
## 2023-12-27 ENCOUNTER — Other Ambulatory Visit: Payer: Self-pay | Admitting: Family Medicine

## 2023-12-28 NOTE — Progress Notes (Deleted)
 09/08/23- 58 yoM followed for OSA, complicated by CAD, CHF, DM2, CKD, Obesity, Referred back by Dr Frann with concerns of insomnia Body weight today-295 lbs CPAP- no longer using. He is told of witnessed apneas an snoring. Sleeps in a chair, without CPAP. Has clonazepam  but not using it Discussed the use of AI scribe software for clinical note transcription with the patient, who gave verbal consent to proceed.  History of Present Illness   Justin Hall is a 58 year old male with sleep apnea who presents with insomnia and CPAP machine issues. He was referred by Dr. Frann for evaluation of insomnia and sleep apnea management.  [name] experiences insomnia for six months, linked to a non-functional CPAP machine. He sleeps sitting up in a chair, waking frequently, with sleep lasting only thirty minutes at a time. In the hospital, CPAP use resulted in restful sleep. His CPAP machine malfunction prevents its use, and he has not started Klonopin  until the machine is functional. His wife observes snoring and apneic episodes during sleep.   He currently has a PICC line in R arm, wound vac and ortho boot on L lower leg after stepping on a screw, leading to partial amputation L foot. Diabetic.     Assessment and Plan:    Obstructive sleep apnea Obstructive sleep apnea with non-functional CPAP causing disturbances. Improved with CPAP during hospitalization. Home sleep test planned to assess status and facilitate CPAP setup. - Order home sleep test. - Coordinate insurance approval for test. - Arrange CPAP setup based on test results. - Advise sleep test on typical night, avoid unusual activities.  Insomnia related to sleep apnea Insomnia likely secondary to untreated sleep apnea. Klonopin  not initiated due to respiratory depression risk without CPAP. - Reassess insomnia management post-CPAP setup. - Advise against Klonopin  until CPAP resumed.  Septic hip and foot surgery Postoperative  status post-hip surgery with sepsis. Receiving antibiotics via PICC, 21 of 25 doses completed. Wound vac in place. - Continue antibiotics via PICC for 4 more days. - Maintain wound vac.     9/0/25- 58 yoM followed for OSA, complicated by CAD, CHF, DM2, CKD, Obesity, Referred back by Dr Frann with concerns of insomnia Body weight today- Sleeps in a chair, without CPAP. Has clonazepam  but not using it We had scheduled updated HST after last visit- not done??       ROS-see HPI   + = positive Constitutional:    weight loss, night sweats, fevers, chills, fatigue, lassitude. HEENT:    headaches, difficulty swallowing, tooth/dental problems, sore throat,       sneezing, itching, ear ache, nasal congestion, post nasal drip, snoring CV:    chest pain, orthopnea, PND, swelling in lower extremities, anasarca,                                   dizziness, palpitations Resp:   shortness of breath with exertion or at rest.                productive cough,   non-productive cough, coughing up of blood.              change in color of mucus.  wheezing.   Skin:    rash or lesions. GI:  No-   heartburn, indigestion, abdominal pain, nausea, vomiting, diarrhea,                 change in bowel  habits, loss of appetite GU: dysuria, change in color of urine, no urgency or frequency.   flank pain. MS:   joint pain, stiffness, decreased range of motion, back pain. Neuro-     nothing unusual Psych:  change in mood or affect.  depression or anxiety.   memory loss.  OBJ- Physical Exam General- Alert, Oriented, Affect-appropriate, Distress- none acute, +obese Skin- rash-none, lesions- none, excoriation- none Lymphadenopathy- none Head- atraumatic            Eyes- Gross vision intact, PERRLA, conjunctivae and secretions clear            Ears- Hearing, canals-normal            Nose- Clear, no-Septal dev, mucus, polyps, erosion, perforation             Throat- Mallampati II , mucosa clear , drainage- none,  tonsils- atrophic Neck- flexible , trachea midline, no stridor , thyroid nl, carotid no bruit Chest - symmetrical excursion , unlabored           Heart/CV- RRR , no murmur , no gallop  , no rub, nl s1 s2                           - JVD- none , edema- none, stasis changes- none, varices- none           Lung- clear to P&A, wheeze- none, cough- none , dullness-none, rub- none           Chest wall- +R PICC line Abd-  Br/ Gen/ Rectal- Not done, not indicated Extrem-+wound vac and Ortho boot L foot Neuro- grossly intact to observation

## 2023-12-29 ENCOUNTER — Ambulatory Visit: Admitting: Internal Medicine

## 2024-01-06 ENCOUNTER — Encounter: Payer: Self-pay | Admitting: Podiatry

## 2024-01-06 ENCOUNTER — Other Ambulatory Visit: Payer: Self-pay | Admitting: Podiatry

## 2024-01-06 MED ORDER — CEFADROXIL 500 MG PO CAPS: 500.0000 mg | ORAL_CAPSULE | Freq: Two times a day (BID) | ORAL | 0 refills | Status: AC

## 2024-01-06 NOTE — Progress Notes (Signed)
Abx rx sent

## 2024-01-07 ENCOUNTER — Telehealth: Payer: Self-pay

## 2024-01-07 NOTE — Telephone Encounter (Signed)
 Patient called again today - asking for a call from Dr. Malvin [phone]

## 2024-01-20 ENCOUNTER — Other Ambulatory Visit: Payer: Self-pay | Admitting: Family Medicine

## 2024-01-20 ENCOUNTER — Encounter: Payer: Self-pay | Admitting: Family Medicine

## 2024-01-20 MED ORDER — OXYCODONE HCL 10 MG PO TABS: 5.0000 mg | ORAL_TABLET | Freq: Three times a day (TID) | ORAL | 0 refills | Status: DC | PRN

## 2024-01-21 ENCOUNTER — Other Ambulatory Visit: Payer: Self-pay | Admitting: Family Medicine

## 2024-01-21 DIAGNOSIS — I10 Essential (primary) hypertension: Secondary | ICD-10-CM

## 2024-01-21 DIAGNOSIS — M1A9XX0 Chronic gout, unspecified, without tophus (tophi): Secondary | ICD-10-CM

## 2024-01-22 ENCOUNTER — Other Ambulatory Visit: Payer: Self-pay | Admitting: Family Medicine

## 2024-02-21 ENCOUNTER — Encounter: Payer: Self-pay | Admitting: Family Medicine

## 2024-02-22 ENCOUNTER — Other Ambulatory Visit: Payer: Self-pay | Admitting: Family Medicine

## 2024-02-22 MED ORDER — OXYCODONE HCL 10 MG PO TABS: 5.0000 mg | ORAL_TABLET | Freq: Three times a day (TID) | ORAL | 0 refills | Status: DC | PRN

## 2024-03-07 ENCOUNTER — Other Ambulatory Visit: Payer: Self-pay

## 2024-03-07 ENCOUNTER — Telehealth: Payer: Self-pay | Admitting: Family Medicine

## 2024-03-07 MED ORDER — TIRZEPATIDE 15 MG/0.5ML ~~LOC~~ SOAJ: 15.0000 mg | SUBCUTANEOUS | 1 refills | Status: AC

## 2024-03-07 NOTE — Telephone Encounter (Unsigned)
 Copied from CRM #[id number]. Topic: Clinical - Medication Refill >> Mar 07, 2024  2:14 PM Harlene ORN wrote: Medication: tirzepatide  (MOUNJARO ) 15 MG/0.5ML Pen  Has the patient contacted their pharmacy? Yes (Agent: If no, request that the patient contact the pharmacy for the refill. If patient does not wish to contact the pharmacy document the reason why and proceed with request.) (Agent: If yes, when and what did the pharmacy advise?)  This is the patient's preferred pharmacy:   [id number] - [address], [place] - [address] AT [facility] [address] [place] [address] Phone: [phone] Fax: [phone]  Is this the correct pharmacy for this prescription? Yes If no, delete pharmacy and type the correct one.   Has the prescription been filled recently? Yes  Is the patient out of the medication? Yes  Has the patient been seen for an appointment in the last year OR does the patient have an upcoming appointment? Yes  Can we respond through MyChart? Yes  Agent: Please be advised that Rx refills may take up to 3 business days. We ask that you follow-up with your pharmacy.

## 2024-03-16 ENCOUNTER — Encounter: Payer: Self-pay | Admitting: Family Medicine

## 2024-03-17 ENCOUNTER — Encounter: Payer: Self-pay | Admitting: Family Medicine

## 2024-03-21 ENCOUNTER — Other Ambulatory Visit: Payer: Self-pay | Admitting: Family Medicine

## 2024-03-21 MED ORDER — OXYCODONE HCL 10 MG PO TABS: 5.0000 mg | ORAL_TABLET | Freq: Three times a day (TID) | ORAL | 0 refills | Status: DC | PRN

## 2024-03-21 NOTE — Telephone Encounter (Signed)
 Requesting: oxycodone  10mg   Contract: None UDS: 04/07/23 Last Visit: 12/23/23 Next Visit: 04/25/24 Last Refill: 02/22/24 #80 and 0RF   Please Advise

## 2024-04-12 ENCOUNTER — Telehealth: Payer: Self-pay | Admitting: *Deleted

## 2024-04-12 NOTE — Telephone Encounter (Signed)
 Opened in error

## 2024-04-12 NOTE — Telephone Encounter (Deleted)
 Pt has lab appt on 12/29.  Future orders are from 07/2023 for bmp but has had labs since then.  Do you want this test done?  Pt last visit was on 12/23/23.

## 2024-04-18 ENCOUNTER — Encounter: Payer: Self-pay | Admitting: Family Medicine

## 2024-04-18 ENCOUNTER — Ambulatory Visit (INDEPENDENT_AMBULATORY_CARE_PROVIDER_SITE_OTHER): Payer: Self-pay | Admitting: Family Medicine

## 2024-04-18 VITALS — BP 132/82 | HR 100 | Temp 98.0°F | Resp 16 | Ht 69.0 in | Wt 281.0 lb

## 2024-04-18 DIAGNOSIS — M1611 Unilateral primary osteoarthritis, right hip: Secondary | ICD-10-CM | POA: Diagnosis not present

## 2024-04-18 MED ORDER — METHOCARBAMOL 500 MG PO TABS: 500.0000 mg | ORAL_TABLET | Freq: Two times a day (BID) | ORAL | 1 refills | Status: AC | PRN

## 2024-04-18 MED ORDER — OXYCODONE HCL 10 MG PO TABS: 5.0000 mg | ORAL_TABLET | Freq: Three times a day (TID) | ORAL | 0 refills | Status: AC | PRN

## 2024-04-18 MED ORDER — ATORVASTATIN CALCIUM 80 MG PO TABS: 80.0000 mg | ORAL_TABLET | Freq: Every day | ORAL | 3 refills | Status: AC

## 2024-04-18 NOTE — Patient Instructions (Signed)
 Keep the diet clean and stay active.  Aim to do some physical exertion for 150 minutes per week. This is typically divided into 5 days per week, 30 minutes per day. The activity should be enough to get your heart rate up. Anything is better than nothing if you have time constraints.  Let us know if you need anything.

## 2024-04-18 NOTE — Addendum Note (Signed)
 Addended by: FRANN MABEL SQUIBB on: 04/18/2024 07:43 AM   Modules accepted: Level of Service
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
## 2024-04-25 ENCOUNTER — Ambulatory Visit: Admitting: Family Medicine

## 2024-05-05 ENCOUNTER — Telehealth: Payer: Self-pay

## 2024-05-05 NOTE — Telephone Encounter (Signed)
 PA approved.  Effective 04/21/2024 - 05/05/2025

## 2024-05-05 NOTE — Telephone Encounter (Signed)
 PA initiated via Covermymeds; KEY: [id number]. Awaiting determination.
